# Patient Record
Sex: Male | Born: 1975
Health system: Southern US, Community
[De-identification: ages and names within clinical notes are randomized; demographics above are authoritative.]

## PROBLEM LIST (undated history)

## (undated) DIAGNOSIS — K5790 Diverticulosis of intestine, part unspecified, without perforation or abscess without bleeding: Secondary | ICD-10-CM

## (undated) DIAGNOSIS — K839 Disease of biliary tract, unspecified: Secondary | ICD-10-CM

## (undated) DIAGNOSIS — K8689 Other specified diseases of pancreas: Secondary | ICD-10-CM

## (undated) DIAGNOSIS — K863 Pseudocyst of pancreas: Secondary | ICD-10-CM

## (undated) DIAGNOSIS — I10 Essential (primary) hypertension: Secondary | ICD-10-CM

## (undated) DIAGNOSIS — R112 Nausea with vomiting, unspecified: Secondary | ICD-10-CM

## (undated) DIAGNOSIS — K219 Gastro-esophageal reflux disease without esophagitis: Secondary | ICD-10-CM

## (undated) DIAGNOSIS — E7800 Pure hypercholesterolemia, unspecified: Secondary | ICD-10-CM

## (undated) DIAGNOSIS — Z889 Allergy status to unspecified drugs, medicaments and biological substances status: Secondary | ICD-10-CM

## (undated) DIAGNOSIS — K859 Acute pancreatitis without necrosis or infection, unspecified: Secondary | ICD-10-CM

## (undated) DIAGNOSIS — F32A Depression, unspecified: Secondary | ICD-10-CM

## (undated) DIAGNOSIS — K760 Fatty (change of) liver, not elsewhere classified: Secondary | ICD-10-CM

## (undated) DIAGNOSIS — F329 Major depressive disorder, single episode, unspecified: Secondary | ICD-10-CM

## (undated) DIAGNOSIS — Z9889 Other specified postprocedural states: Secondary | ICD-10-CM

## (undated) HISTORY — DX: Pseudocyst of pancreas: K86.3

## (undated) HISTORY — DX: Disease of biliary tract, unspecified: K83.9

## (undated) HISTORY — DX: Diverticulosis of intestine, part unspecified, without perforation or abscess without bleeding: K57.90

## (undated) HISTORY — DX: Other specified diseases of pancreas: K86.89

## (undated) HISTORY — PX: WISDOM TOOTH EXTRACTION: SHX21

## (undated) HISTORY — DX: Fatty (change of) liver, not elsewhere classified: K76.0

---

## 2016-01-05 ENCOUNTER — Emergency Department (HOSPITAL_COMMUNITY): Payer: BLUE CROSS/BLUE SHIELD

## 2016-01-05 ENCOUNTER — Encounter (HOSPITAL_COMMUNITY): Payer: Self-pay | Admitting: Emergency Medicine

## 2016-01-05 ENCOUNTER — Inpatient Hospital Stay (HOSPITAL_COMMUNITY)
Admission: EM | Admit: 2016-01-05 | Discharge: 2016-02-03 | DRG: 439 | Disposition: A | Discharge status: Home / Self Care | Payer: BLUE CROSS/BLUE SHIELD | Attending: Internal Medicine | Admitting: Internal Medicine

## 2016-01-05 DIAGNOSIS — R740 Nonspecific elevation of levels of transaminase and lactic acid dehydrogenase [LDH]: Secondary | ICD-10-CM | POA: Diagnosis present

## 2016-01-05 DIAGNOSIS — K851 Biliary acute pancreatitis without necrosis or infection: Secondary | ICD-10-CM | POA: Diagnosis not present

## 2016-01-05 DIAGNOSIS — R1110 Vomiting, unspecified: Secondary | ICD-10-CM | POA: Insufficient documentation

## 2016-01-05 DIAGNOSIS — F329 Major depressive disorder, single episode, unspecified: Secondary | ICD-10-CM | POA: Diagnosis not present

## 2016-01-05 DIAGNOSIS — Z79899 Other long term (current) drug therapy: Secondary | ICD-10-CM | POA: Diagnosis not present

## 2016-01-05 DIAGNOSIS — K8581 Other acute pancreatitis with uninfected necrosis: Secondary | ICD-10-CM | POA: Diagnosis not present

## 2016-01-05 DIAGNOSIS — E7800 Pure hypercholesterolemia, unspecified: Secondary | ICD-10-CM | POA: Diagnosis present

## 2016-01-05 DIAGNOSIS — K567 Ileus, unspecified: Secondary | ICD-10-CM | POA: Diagnosis not present

## 2016-01-05 DIAGNOSIS — B358 Other dermatophytoses: Secondary | ICD-10-CM | POA: Diagnosis present

## 2016-01-05 DIAGNOSIS — K8592 Acute pancreatitis with infected necrosis, unspecified: Secondary | ICD-10-CM | POA: Diagnosis not present

## 2016-01-05 DIAGNOSIS — K858 Other acute pancreatitis without necrosis or infection: Secondary | ICD-10-CM | POA: Diagnosis present

## 2016-01-05 DIAGNOSIS — K863 Pseudocyst of pancreas: Secondary | ICD-10-CM | POA: Diagnosis present

## 2016-01-05 DIAGNOSIS — R509 Fever, unspecified: Secondary | ICD-10-CM | POA: Diagnosis not present

## 2016-01-05 DIAGNOSIS — R1310 Dysphagia, unspecified: Secondary | ICD-10-CM | POA: Diagnosis not present

## 2016-01-05 DIAGNOSIS — K8020 Calculus of gallbladder without cholecystitis without obstruction: Secondary | ICD-10-CM | POA: Diagnosis present

## 2016-01-05 DIAGNOSIS — Z205 Contact with and (suspected) exposure to viral hepatitis: Secondary | ICD-10-CM | POA: Diagnosis not present

## 2016-01-05 DIAGNOSIS — K8501 Idiopathic acute pancreatitis with uninfected necrosis: Secondary | ICD-10-CM | POA: Diagnosis not present

## 2016-01-05 DIAGNOSIS — K859 Acute pancreatitis without necrosis or infection, unspecified: Secondary | ICD-10-CM | POA: Diagnosis not present

## 2016-01-05 DIAGNOSIS — K8689 Other specified diseases of pancreas: Secondary | ICD-10-CM | POA: Insufficient documentation

## 2016-01-05 DIAGNOSIS — Z4659 Encounter for fitting and adjustment of other gastrointestinal appliance and device: Secondary | ICD-10-CM

## 2016-01-05 DIAGNOSIS — F1123 Opioid dependence with withdrawal: Secondary | ICD-10-CM | POA: Diagnosis not present

## 2016-01-05 DIAGNOSIS — K8591 Acute pancreatitis with uninfected necrosis, unspecified: Secondary | ICD-10-CM | POA: Diagnosis not present

## 2016-01-05 DIAGNOSIS — I10 Essential (primary) hypertension: Secondary | ICD-10-CM | POA: Diagnosis present

## 2016-01-05 DIAGNOSIS — K8080 Other cholelithiasis without obstruction: Secondary | ICD-10-CM | POA: Diagnosis not present

## 2016-01-05 DIAGNOSIS — Z880 Allergy status to penicillin: Secondary | ICD-10-CM

## 2016-01-05 DIAGNOSIS — D649 Anemia, unspecified: Secondary | ICD-10-CM | POA: Diagnosis present

## 2016-01-05 DIAGNOSIS — R112 Nausea with vomiting, unspecified: Secondary | ICD-10-CM | POA: Diagnosis not present

## 2016-01-05 DIAGNOSIS — R188 Other ascites: Secondary | ICD-10-CM | POA: Diagnosis present

## 2016-01-05 DIAGNOSIS — E785 Hyperlipidemia, unspecified: Secondary | ICD-10-CM

## 2016-01-05 DIAGNOSIS — D72829 Elevated white blood cell count, unspecified: Secondary | ICD-10-CM | POA: Diagnosis not present

## 2016-01-05 DIAGNOSIS — K219 Gastro-esophageal reflux disease without esophagitis: Secondary | ICD-10-CM | POA: Diagnosis present

## 2016-01-05 HISTORY — DX: Pure hypercholesterolemia, unspecified: E78.00

## 2016-01-05 HISTORY — DX: Essential (primary) hypertension: I10

## 2016-01-05 LAB — URINALYSIS, ROUTINE W REFLEX MICROSCOPIC
Bilirubin Urine: NEGATIVE
GLUCOSE, UA: NEGATIVE mg/dL
HGB URINE DIPSTICK: NEGATIVE
KETONES UR: NEGATIVE mg/dL
Leukocytes, UA: NEGATIVE
Nitrite: NEGATIVE
PROTEIN: NEGATIVE mg/dL
Specific Gravity, Urine: 1.011 (ref 1.005–1.030)
pH: 6 (ref 5.0–8.0)

## 2016-01-05 LAB — CBC WITH DIFFERENTIAL/PLATELET
BASOS ABS: 0 10*3/uL (ref 0.0–0.1)
Basophils Relative: 0 %
EOS ABS: 0 10*3/uL (ref 0.0–0.7)
EOS PCT: 0 %
HCT: 50.1 % (ref 39.0–52.0)
Hemoglobin: 16.7 g/dL (ref 13.0–17.0)
LYMPHS PCT: 8 %
Lymphs Abs: 1.3 10*3/uL (ref 0.7–4.0)
MCH: 27.2 pg (ref 26.0–34.0)
MCHC: 33.3 g/dL (ref 30.0–36.0)
MCV: 81.5 fL (ref 78.0–100.0)
MONO ABS: 1.2 10*3/uL — AB (ref 0.1–1.0)
Monocytes Relative: 7 %
Neutro Abs: 14.3 10*3/uL — ABNORMAL HIGH (ref 1.7–7.7)
Neutrophils Relative %: 85 %
PLATELETS: 347 10*3/uL (ref 150–400)
RBC: 6.15 MIL/uL — AB (ref 4.22–5.81)
RDW: 13.5 % (ref 11.5–15.5)
WBC: 16.8 10*3/uL — AB (ref 4.0–10.5)

## 2016-01-05 LAB — COMPREHENSIVE METABOLIC PANEL
ALT: 383 U/L — ABNORMAL HIGH (ref 17–63)
AST: 581 U/L — AB (ref 15–41)
Albumin: 4.7 g/dL (ref 3.5–5.0)
Alkaline Phosphatase: 56 U/L (ref 38–126)
Anion gap: 13 (ref 5–15)
BUN: 12 mg/dL (ref 6–20)
CO2: 26 mmol/L (ref 22–32)
CREATININE: 1.2 mg/dL (ref 0.61–1.24)
Calcium: 9.6 mg/dL (ref 8.9–10.3)
Chloride: 102 mmol/L (ref 101–111)
GFR calc Af Amer: >60 mL/min (ref >60)
Glucose, Bld: 136 mg/dL — ABNORMAL HIGH (ref 65–99)
Potassium: 3.8 mmol/L (ref 3.5–5.1)
SODIUM: 141 mmol/L (ref 135–145)
TOTAL PROTEIN: 7.4 g/dL (ref 6.5–8.1)
Total Bilirubin: 1.2 mg/dL (ref 0.3–1.2)

## 2016-01-05 LAB — LIPASE, BLOOD

## 2016-01-05 LAB — ETHANOL: Alcohol, Ethyl (B): <5 mg/dL (ref <5)

## 2016-01-05 LAB — TRIGLYCERIDES: Triglycerides: 42 mg/dL (ref <150)

## 2016-01-05 MED ORDER — ONDANSETRON HCL 4 MG/2ML IJ SOLN
4.0000 mg | Freq: Once | INTRAMUSCULAR | Status: AC
  Administered 2016-01-05: 4 mg via INTRAVENOUS
  Filled 2016-01-05: qty 2

## 2016-01-05 MED ORDER — ONDANSETRON HCL 4 MG PO TABS: 4.0000 mg | ORAL_TABLET | Freq: Four times a day (QID) | ORAL | Status: DC | PRN

## 2016-01-05 MED ORDER — FENTANYL CITRATE (PF) 100 MCG/2ML IJ SOLN
50.0000 ug | Freq: Once | INTRAMUSCULAR | Status: AC
  Administered 2016-01-05: 50 ug via INTRAVENOUS
  Filled 2016-01-05: qty 2

## 2016-01-05 MED ORDER — SODIUM CHLORIDE 0.9 % IV BOLUS (SEPSIS)
1000.0000 mL | Freq: Once | INTRAVENOUS | Status: AC
  Administered 2016-01-05: 1000 mL via INTRAVENOUS

## 2016-01-05 MED ORDER — HYDROMORPHONE HCL 1 MG/ML IJ SOLN
1.0000 mg | INTRAMUSCULAR | Status: DC | PRN
  Administered 2016-01-05: 1 mg via INTRAVENOUS
  Filled 2016-01-05: qty 1

## 2016-01-05 MED ORDER — ONDANSETRON HCL 4 MG/2ML IJ SOLN
4.0000 mg | Freq: Four times a day (QID) | INTRAMUSCULAR | Status: DC | PRN
  Administered 2016-01-05 (×2): 4 mg via INTRAVENOUS
  Filled 2016-01-05 (×2): qty 2

## 2016-01-05 MED ORDER — PROMETHAZINE HCL 25 MG/ML IJ SOLN
25.0000 mg | Freq: Once | INTRAMUSCULAR | Status: AC
  Administered 2016-01-05: 25 mg via INTRAVENOUS
  Filled 2016-01-05: qty 1

## 2016-01-05 MED ORDER — HYDROMORPHONE HCL 1 MG/ML IJ SOLN
2.0000 mg | Freq: Once | INTRAMUSCULAR | Status: AC
  Administered 2016-01-05: 2 mg via INTRAVENOUS
  Filled 2016-01-05: qty 2

## 2016-01-05 MED ORDER — ENOXAPARIN SODIUM 40 MG/0.4ML ~~LOC~~ SOLN
40.0000 mg | SUBCUTANEOUS | Status: DC
  Administered 2016-01-05 – 2016-02-02 (×29): 40 mg via SUBCUTANEOUS
  Filled 2016-01-05 (×28): qty 0.4

## 2016-01-05 MED ORDER — HYDROMORPHONE HCL 1 MG/ML IJ SOLN
1.0000 mg | Freq: Once | INTRAMUSCULAR | Status: AC
  Administered 2016-01-05: 1 mg via INTRAVENOUS
  Filled 2016-01-05: qty 1

## 2016-01-05 MED ORDER — SODIUM CHLORIDE 0.9 % IV SOLN
8.0000 mg | Freq: Four times a day (QID) | INTRAVENOUS | Status: DC | PRN
  Administered 2016-01-06: 8 mg via INTRAVENOUS
  Filled 2016-01-05 (×2): qty 4

## 2016-01-05 MED ORDER — IOHEXOL 300 MG/ML  SOLN
100.0000 mL | Freq: Once | INTRAMUSCULAR | Status: AC | PRN
  Administered 2016-01-05: 100 mL via INTRAVENOUS

## 2016-01-05 MED ORDER — FAMOTIDINE IN NACL 20-0.9 MG/50ML-% IV SOLN
20.0000 mg | Freq: Once | INTRAVENOUS | Status: AC
  Administered 2016-01-05: 20 mg via INTRAVENOUS
  Filled 2016-01-05: qty 50

## 2016-01-05 MED ORDER — HYDROMORPHONE HCL 1 MG/ML IJ SOLN
1.0000 mg | INTRAMUSCULAR | Status: DC | PRN
  Administered 2016-01-05 – 2016-01-06 (×5): 1 mg via INTRAVENOUS
  Filled 2016-01-05 (×5): qty 1

## 2016-01-05 MED ORDER — SODIUM CHLORIDE 0.9 % IV SOLN
INTRAVENOUS | Status: DC
  Administered 2016-01-05 – 2016-01-06 (×4): via INTRAVENOUS

## 2016-01-05 NOTE — ED Notes (Signed)
Patient transported to CT 

## 2016-01-05 NOTE — ED Notes (Signed)
Patient reports having lower left abdominal pain that occasionally goes to his umbilicus. Denies abdominal surgeries, reports taking an antacid without relief. Reports 7 episodes of nausea today.

## 2016-01-05 NOTE — ED Notes (Signed)
Reported Drew Prince count of 16.8 to [place] PA-C, no fever. He acknowledges, no lactic needed at this time.

## 2016-01-05 NOTE — ED Notes (Signed)
PA at bedside.

## 2016-01-05 NOTE — H&P (Signed)
Date: 01/05/2016               Patient Name:  Drew Prince MRN: [id number]  DOB: 19-Feb-1976 Age / Sex: 40 y.o., male   PCP: [facility] [place]         Medical Service: Internal Medicine Teaching Service         Attending Physician: Dr. Tomasita Crumble, MD    First Contact: Dr. Selina Cooley Pager: [phone]  Second Contact: Dr. Rich Number Pager: [phone]       After Hours (After 5p/  First Contact Pager: [phone]  weekends / holidays): Second Contact Pager: [phone]   Chief Complaint: "My stomach is killing me."  History of Present Illness:  Drew Prince is a 40 year old man with history of hypertension and hyperlipidemia presenting with acute onset abdominal pain.  Last night around 1:30 AM, he started feeling a sharp periumbilical pain, nausea, and vomiting. The pain spread across his lower abdomen and he continued to feel nauseous, so he came into the emergency department. He denies any fevers, he rarely drinks alcohol (his last drink was a shot on New Year's Eve), he has never had gallstones, and he has been all for his medications for the last 2-3 years, without any new changes. He hasn't started taking any new NSAIDs, steroids, or herbal supplements. Prior to his acute onset abdominal pain, he was feeling very well, denies any rashes, weight loss, joint pains, and there are no autoimmune diseases in his family that he is aware of.  Meds: No current facility-administered medications for this encounter.   Current Outpatient Prescriptions  Medication Sig Dispense Refill  . atorvastatin (LIPITOR) 10 MG tablet Take 10 mg by mouth daily.    Marland Kitchen buPROPion (WELLBUTRIN XL) 150 MG 24 hr tablet Take 150 mg by mouth daily.    . fenofibrate (TRICOR) 145 MG tablet Take 145 mg by mouth daily.    Marland Kitchen lisinopril (PRINIVIL,ZESTRIL) 5 MG tablet Take 5 mg by mouth daily.     Allergies: Allergies as of 01/05/2016 - Review Complete 01/05/2016  Allergen Reaction Noted  .  Penicillins Rash 01/05/2016   Past Medical History  Diagnosis Date  . Hypertension    History reviewed. No pertinent past surgical history. History reviewed. No pertinent family history. Social History   Social History  . Marital Status: Married    Spouse Name: N/A  . Number of Children: N/A  . Years of Education: N/A   Occupational History  . Not on file.   Social History Main Topics  . Smoking status: Never Smoker   . Smokeless tobacco: Not on file  . Alcohol Use: Yes     Comment: rarely  . Drug Use: Not on file  . Sexual Activity: Not on file   Other Topics Concern  . Not on file   Social History Narrative  . No narrative on file   Review of Systems  Constitutional: Negative for fever, chills, weight loss and malaise/fatigue.  HENT: Negative for congestion.   Eyes: Negative for blurred vision and double vision.  Respiratory: Negative for cough and shortness of breath.   Cardiovascular: Negative for chest pain and palpitations.  Gastrointestinal: Positive for nausea, vomiting and abdominal pain. Negative for diarrhea, constipation, blood in stool and melena.  Genitourinary: Negative for dysuria and urgency.  Musculoskeletal: Negative for myalgias and back pain.  Skin: Negative for itching and rash.  Neurological: Negative for dizziness, loss of consciousness and headaches.  Psychiatric/Behavioral: Negative for  depression and substance abuse.   Physical Exam: Blood pressure 161/113, pulse 100, temperature 98.2 F (36.8 C), temperature source Oral, resp. rate 20, SpO2 100 %. General: resting in bed, writhing around in pain, with a green vomit bag in his hand HEENT: no scleral icterus, extra-ocular muscles intact, oropharynx without lesions Cardiac: regular rate and rhythm, no rubs, murmurs or gallops Pulm: breathing well, clear to auscultation bilaterally Abd: bowel sounds hypoactive, tender throughout, but mostly in the epigastrium, no peritoneal signs, no Cullen  or Turner sign, and soft, nondistended Ext: warm and well perfused, without pedal edema Lymph: no cervical or supraclavicular lymphadenopathy Skin: no rash, hair, or nail changes Neuro: alert and oriented X3, cranial nerves II-XII grossly intact, moving all extremities well  Lab results: Basic Metabolic Panel:  Recent Labs  16/10/96 0615  NA 141  K 3.8  CL 102  CO2 26  GLUCOSE 136*  BUN 12  CREATININE 1.20  CALCIUM 9.6   Liver Function Tests:  Recent Labs  01/05/16 0615  AST 581*  ALT 383*  ALKPHOS 56  BILITOT 1.2  PROT 7.4  ALBUMIN 4.7    Recent Labs  01/05/16 0615  LIPASE >3000*   CBC:  Recent Labs  01/05/16 0615  WBC 16.8*  NEUTROABS 14.3*  HGB 16.7  HCT 50.1  MCV 81.5  PLT 347   Fasting Lipid Panel:  Recent Labs  01/05/16 0920  TRIG 42   Urinalysis:  Recent Labs  01/05/16 0757  COLORURINE YELLOW  LABSPEC 1.011  PHURINE 6.0  GLUCOSEU NEGATIVE  HGBUR NEGATIVE  BILIRUBINUR NEGATIVE  KETONESUR NEGATIVE  PROTEINUR NEGATIVE  NITRITE NEGATIVE  LEUKOCYTESUR NEGATIVE   Imaging results:  Ct Abdomen Pelvis W Contrast  01/05/2016  CLINICAL DATA:  Left lower quadrant pain radiating to right side, with nausea and vomiting. EXAM: CT ABDOMEN AND PELVIS WITH CONTRAST TECHNIQUE: Multidetector CT imaging of the abdomen and pelvis was performed using the standard protocol following bolus administration of intravenous contrast. CONTRAST:  OMNIPAQUE IOHEXOL 300 MG/ML  SOLN COMPARISON:  None. FINDINGS: Lower chest:  No acute findings. Hepatobiliary: Liver is low in density suggesting fatty infiltration. Small layering gallstones appreciated within the nondistended gallbladder. Gallbladder walls appear thickened, likely reactive in nature. Pancreas: Ill-defined fluid surrounding the pancreas, with extension into the right upper quadrant and left upper quadrant, consistent with acute pancreatitis. At least mild edema within the distal pancreatic body. No  evidence of parenchymal necrosis or peripancreatic hemorrhage. No pancreatic mass or pancreatic duct dilatation appreciated. No evidence of pseudocyst formation. Spleen: Within normal limits in size and appearance. Adrenals/Urinary Tract: Adrenal glands appear normal. Kidneys are unremarkable without stone or hydronephrosis. No ureteral or bladder calculi identified. Stomach/Bowel: Bowel is normal in caliber. No bowel wall thickening or evidence of bowel wall inflammation seen. Scattered mild diverticulosis noted without evidence of acute diverticulitis. Appendix is unremarkable. Vascular/Lymphatic: No pathologically enlarged lymph nodes. No evidence of abdominal aortic aneurysm. Reproductive: No mass or other significant abnormality. Other: None. Musculoskeletal: Mild degenerative change within the lumbar spine. No acute osseous abnormality. Superficial soft tissues are unremarkable. IMPRESSION: 1. Acute pancreatitis, with moderate amount of ill-defined fluid surrounding the pancreas and extending into the adjacent left upper quadrant and right upper quadrant. Associated edema within the distal pancreatic body. No complicating features. 2. Fatty infiltration of the liver. 3. Cholelithiasis. Gallbladder wall thickening is likely reactive to the adjacent pancreatitis and/or liver disease. 4. Mild colonic diverticulosis without evidence of acute diverticulitis. Electronically Signed   By:  Bary RichardStan  Maynard M.D.   On: 01/05/2016 08:38    Assessment & Plan by Problem:  [name] is a 40 year old Asian man with hypertension and hyperlipidemia presenting with acute pancreatitis as evidenced by a lipase greater than 3000 and acute pancreatitis seen on abdominal CT. He rarely drinks alcohol, there is no evidence of choledocholithiasis nor an obstructive pattern on his liver function tests, his triglyceride level was normal, and calcium level was normal, so I don't think this is alcohol, gallstone, hypertriglyceridemia,  or hypercalcemia induced. He has been on all of his medications since the last 2-3 years; however, lisinopril, atorvastatin, and fenofibrate have all been reported to cause medication-induced pancreatitis so this is my leading theory. Autoimmune pancreatitis is another consideration as he is an [place] man, but he doesn't have any history of autoimmune conditions nor any suggestive family history. We will make nothing by mouth but advance his diet as he tolerates, give IV fluids, control his pain and nausea, and hold all of his medications. There was no evidence of pancreatic necrosis to warrant antibiotics. His [place] 2 score is low at 7, with a 7% mortality rate. I am hopeful he will improve in the next 3 days. If he fails to improve, we will of course re-image him and can also consider getting an IgG4 level to look for autoimmune pancreatitis and start steroids if this is positive.  Acute pancreatitis: Per above, I think this is most likely drug-induced or idiopathic. -Holding home medications -Nothing by mouth, advancing diet as tolerated to promote early enteral feeding -Dilaudid 1 mg every 3 hours as needed for pain -Ondansetron 4 mg every 6 hours as needed for nausea   Transaminitis: AST 580, ALT 380, consistent with hepatocellular transaminitis. I think this is most likely from fatty liver as evidenced on his abdominal CT for perhaps from adjacent inflammation from the pancreas. As mentioned above, he hardly drinks alcohol. -CMP tomorrow  Hypertension: Per above, we are holding his lisinopril. -Holding lisinopril  Hyperlipidemia: Per above, his triglyceride level is normal, and we are holding his fenofibrate and atorvastatin. -Holding fenofibrate and atorvastatin  Dispo: Disposition is deferred at this time, awaiting improvement of current medical problems. Anticipated discharge in approximately 2-3 day(s).   The patient does have [facility]  and [facility].  The patient does not have transportation limitations that hinder transportation to clinic appointments.  Signed: Selina CooleyKyle Indiyah Paone, MD 01/05/2016, 10:23 AM

## 2016-01-05 NOTE — ED Notes (Signed)
Admitting MDs at bedside.

## 2016-01-05 NOTE — ED Notes (Signed)
Tinnie GensJeffrey, PA-C, at the bedside.

## 2016-01-05 NOTE — Discharge Summary (Signed)
Name: Drew Prince MRN: [id number] DOB: 25-Feb-1976 40 y.o. [facility] [place]  Date of Admission: 01/05/2016  5:59 AM Date of Discharge: 02/03/2016 Attending Physician: Gardiner Barefootobert W Comer, MD  Discharge Diagnosis: 1. Acute pancreatitis thought to be medication-induced versus idiopathic 2. Hypertension 3. Hyperlipidemia   Discharge Medications:   Medication List    STOP taking these medications        atorvastatin 10 MG tablet  Commonly known as:  LIPITOR     fenofibrate 145 MG tablet  Commonly known as:  TRICOR     lisinopril 5 MG tablet  Commonly known as:  PRINIVIL,ZESTRIL      TAKE these medications        buPROPion 150 MG 24 hr tablet  Commonly known as:  WELLBUTRIN XL  Take 150 mg by mouth daily.     ondansetron 4 MG disintegrating tablet  Commonly known as:  ZOFRAN-ODT  Take 1-2 tablets (4-8 mg total) by mouth every 8 (eight) hours as needed for nausea or vomiting.     oxyCODONE 5 MG immediate release tablet  Commonly known as:  Oxy IR/ROXICODONE  Take 1-2 tablets (5-10 mg total) by mouth every 4 (four) hours as needed for severe pain.     pantoprazole 40 MG tablet  Commonly known as:  PROTONIX  Take 1 tablet (40 mg total) by mouth daily.        Disposition and follow-up:   [name] [facility]  At the hospital follow up visit please address:  1. Resolution of his abdominal pain  2. Evaluate for subsequent chronic pancreatitis and consider lipase enzymes  Follow-up Appointments:  Our secretaries will schedule an appointment some time between 2/13-2/17  Consultations:  General surgery Gastroenterology  Procedures Performed:  [place] Abdomen Complete  01/06/2016  CLINICAL DATA:  Acute pancreatitis, fatty infiltrative change of the liver, gallstones, ascites. EXAM: ABDOMEN ULTRASOUND COMPLETE COMPARISON:  Abdominal and pelvic CT scan of January 05, 2016 FINDINGS:  Gallbladder: The gallbladder is adequately distended. There are multiple echogenic shadowing stones measuring up to 1.2 cm. There is no gallbladder wall thickening, pericholecystic fluid, or positive sonographic Murphy's sign. Common bile duct: Diameter: 5.3 mm Liver: The liver exhibits increased echotexture diffusely compatible with fatty infiltrative change. There is no focal mass or ductal dilation. IVC: No abnormality visualized. Pancreas: The pancreatic bed is partially obscured by bowel gas. Only a small portion of the pancreatic body is demonstrated. Spleen: Size and appearance within normal limits. Right Kidney: Length: 12 cm. Echogenicity within normal limits. No mass or hydronephrosis visualized. Left Kidney: Length: 12.4 cm. Echogenicity within normal limits. No mass or hydronephrosis visualized. Abdominal aorta: Visualization of the mid aorta is limited by bowel gas. The proximal and distal aorta are normal in caliber. Other findings: None. IMPRESSION: 1. Limited visualization of the pancreas. 2. Gallstones without sonographic and evidence of acute cholecystitis. Fatty infiltrative change of the liver. 3. No acute abnormality is observed elsewhere within the abdomen. Electronically Signed   By: David  SwazilandJordan M.D.   On: 01/06/2016 12:22   Ct Abdomen Pelvis W Contrast  01/10/2016  CLINICAL DATA:  Pancreatitis follow-up. History of hypertension and hypercholesterolemia. EXAM: CT ABDOMEN AND PELVIS WITH CONTRAST TECHNIQUE: Multidetector CT imaging of the abdomen and pelvis was performed using the standard protocol following bolus administration of intravenous contrast. CONTRAST:  100mL OMNIPAQUE IOHEXOL 300 MG/ML  SOLN COMPARISON:  01/05/2016 FINDINGS: Lower chest: Small new bilateral  pleural effusions are present. There is new left basilar atelectasis or early consolidation. Upper abdomen: The liver is diffusely low in attenuation and consistent with hepatic steatosis. No focal liver lesions are  identified. No focal abnormality identified within the spleen or left kidney. Small right renal cyst is identified. There is significant peripancreatic and retroperitoneal fluid, significantly increased since the prior study. There is a 2.0 x 2.9 cm area of the distal pancreatic body showing poor enhancement on today's exam, consistent with infarction or early necrosis. A similar area of poor enhancement is identified along the ventral aspect of the mid pancreas measuring 1.3 x 3.6 cm. There is significant fluid within the lesser sac. However no discrete fluid collections are identified to indicate pseudocyst or abscess. Gallstones are better seen on the prior study. Gastrointestinal tract: Partial malrotation of the small bowel. Jejunal loops distended in the right abdomen. Appendix is normal in appearance. Colonic loops contain residual contrast following recent CT exam and are otherwise unremarkable. Pelvis: Urinary bladder is distended. The prostate gland and seminal vesicles have a normal appearance. Small amount of free pelvic fluid noted. Retroperitoneum: No evidence for aortic aneurysm. Small retroperitoneal lymph nodes are nonspecific and likely reactive. Abdominal wall: Mild body wall edema. Osseous structures: Unremarkable. IMPRESSION: 1. Interval progression of peripancreatic and retroperitoneal exam dates. 2. New areas of pancreatic ischemia or necrosis. 3. No pseudocyst or abscess identified. 4. New small bilateral pleural effusions and bibasilar atelectasis or early consolidation. 5. Hepatic steatosis. 6. Partial malrotation of the small bowel incidentally noted. No bowel obstruction. 7. Mild body wall edema. The salient findings were discussed with Dr. Derrell Lolling on 01/10/2016 at 1:32 pm. Electronically Signed   By: Norva Pavlov M.D.   On: 01/10/2016 13:32   Ct Abdomen Pelvis W Contrast  01/05/2016  CLINICAL DATA:  Left lower quadrant pain radiating to right side, with nausea and vomiting. EXAM: CT  ABDOMEN AND PELVIS WITH CONTRAST TECHNIQUE: Multidetector CT imaging of the abdomen and pelvis was performed using the standard protocol following bolus administration of intravenous contrast. CONTRAST:  OMNIPAQUE IOHEXOL 300 MG/ML  SOLN COMPARISON:  None. FINDINGS: Lower chest:  No acute findings. Hepatobiliary: Liver is low in density suggesting fatty infiltration. Small layering gallstones appreciated within the nondistended gallbladder. Gallbladder walls appear thickened, likely reactive in nature. Pancreas: Ill-defined fluid surrounding the pancreas, with extension into the right upper quadrant and left upper quadrant, consistent with acute pancreatitis. At least mild edema within the distal pancreatic body. No evidence of parenchymal necrosis or peripancreatic hemorrhage. No pancreatic mass or pancreatic duct dilatation appreciated. No evidence of pseudocyst formation. Spleen: Within normal limits in size and appearance. Adrenals/Urinary Tract: Adrenal glands appear normal. Kidneys are unremarkable without stone or hydronephrosis. No ureteral or bladder calculi identified. Stomach/Bowel: Bowel is normal in caliber. No bowel wall thickening or evidence of bowel wall inflammation seen. Scattered mild diverticulosis noted without evidence of acute diverticulitis. Appendix is unremarkable. Vascular/Lymphatic: No pathologically enlarged lymph nodes. No evidence of abdominal aortic aneurysm. Reproductive: No mass or other significant abnormality. Other: None. Musculoskeletal: Mild degenerative change within the lumbar spine. No acute osseous abnormality. Superficial soft tissues are unremarkable. IMPRESSION: 1. Acute pancreatitis, with moderate amount of ill-defined fluid surrounding the pancreas and extending into the adjacent left upper quadrant and right upper quadrant. Associated edema within the distal pancreatic body. No complicating features. 2. Fatty infiltration of the liver. 3. Cholelithiasis.  Gallbladder wall thickening is likely reactive to the adjacent pancreatitis and/or liver disease. 4.  Mild colonic diverticulosis without evidence of acute diverticulitis. Electronically Signed   By: Bary Richard M.D.   On: 01/05/2016 08:38   Mr 3d Recon At Scanner  01/17/2016  CLINICAL DATA:  Pancreatitis presumed to be from gallstones not resolving after 10 days, concern for a biliary anatomical defect. Pt has abdominal pain. mostly tender in the left lower quadrant EXAM: MRI ABDOMEN WITHOUT AND WITH CONTRAST (INCLUDING MRCP) TECHNIQUE: Multiplanar multisequence MR imaging of the abdomen was performed both before and after the administration of intravenous contrast. Heavily T2-weighted images of the biliary and pancreatic ducts were obtained, and three-dimensional MRCP images were rendered by post processing. CONTRAST:  16mL MULTIHANCE GADOBENATE DIMEGLUMINE 529 MG/ML IV SOLN COMPARISON:  CT 01/10/2016 FINDINGS: Lower chest:  Lung bases are clear. Hepatobiliary: No intrahepatic biliary duct dilatation. The common bile duct is normal caliber without obstruction or choledocholithiasis. There is a gallstone within the gallbladder measuring 10 millimeters. No gallbladder inflammation identified. Pancreas: The pancreatic duct is thin and difficult to follow. No variant pancreatic ductal anatomy. The main pancreatic duct joins the common bile duct at the ampulla. The duct is followed through the head and body (image 27, series 3). There is a short segment of pancreas were the duct is difficult to follow. This is the same segment where there is a loss of enhancement on the post-contrast imaging (image 27, series 3 ; image 47, series 1302). Concern for pancreatic ductal interruption at this level. This region is the same region which was de vascularized on comparison and continues to demonstrate absent parenchymal enhancement. This region measures 2 centimeters in length (image 47, series 1301). The tail the pancreas  has normal enhancement. The fluid collections along the body and tail the pancreas are now organized with enhancing thin rim. Fluid collections appear to communicate with the epicenter surrounding the region of pancreatic devascularization. The collection positioned between the stomach, pancreatic tail and spleen measures 9.7 x 4.7 centimeters (image 36, series 1302). Collection along the body of the pancreas measures 3.4 x 7.2 centimeters. The enhancing collections extend inferiorly along the anterior para renal fascia on the LEFT (image 60, series 1302). Spleen: Normal spleen. Adrenals/urinary tract: Adrenal glands and kidneys are normal. Stomach/Bowel: Stomach and limited of the small bowel is unremarkable Vascular/Lymphatic: Abdominal aortic normal caliber. No retroperitoneal periportal lymphadenopathy. Musculoskeletal: No aggressive osseous lesion IMPRESSION: 1. Fluid collections surrounding the body and tail the pancreas are more organized with thin enhancing rim consistent with pseudocyst formation. 2. Peripancreatic enhancing fluid collections have epicenter at a region devascularization of the mid pancreas. This pancreatic duct is difficult to follow through this region of devascularization with concern for ductal interruption at this level. The tail the pancreas is normal with normal enhanced. The distal body and head of the pancreas are normal. 3. No variant pancreatic ductal anatomy. 4. Common bile duct is normal caliber without obstruction. 5. Gallstones within the gallbladder. 6. No intrahepatic biliary duct dilatation. Electronically Signed   By: Genevive Bi M.D.   On: 01/17/2016 08:30   Dg Chest Port 1 View  01/10/2016  CLINICAL DATA:  Line placement. EXAM: PORTABLE CHEST 1 VIEW COMPARISON:  None. FINDINGS: Right PICC line noted with tip at cavoatrial junction. Low lung volumes with mild bibasilar atelectasis. Small left pleural effusion. No pneumothorax. IMPRESSION: 1. Right PICC line noted  with tip in cavoatrial junction. 2. Low lung volumes with mild bibasilar atelectasis and left pleural effusion. Electronically Signed   By: Maisie Fus  Register  On: 01/10/2016 14:42   Dg Abd Portable 1v  01/13/2016  CLINICAL DATA:  40 year old male status post feeding tube placement. EXAM: PORTABLE ABDOMEN - 1 VIEW COMPARISON:  No priors. FINDINGS: Single supine view of the abdomen demonstrates the tip of the feeding tube in the antral pre-pyloric region of the stomach. Some gas is noted throughout visualized small bowel and colon. Several nondilated gas-filled loops of small bowel are noted in the lower abdomen. No definite pathologic dilatation of small bowel loops. No pneumoperitoneum. IMPRESSION: 1. Tip of feeding tube is in the antral pre-pyloric region of the stomach. Electronically Signed   By: Trudie Reed M.D.   On: 01/13/2016 17:12   Mr Abd W/wo Cm/mrcp  01/17/2016  CLINICAL DATA:  Pancreatitis presumed to be from gallstones not resolving after 10 days, concern for a biliary anatomical defect. Pt has abdominal pain. mostly tender in the left lower quadrant EXAM: MRI ABDOMEN WITHOUT AND WITH CONTRAST (INCLUDING MRCP) TECHNIQUE: Multiplanar multisequence MR imaging of the abdomen was performed both before and after the administration of intravenous contrast. Heavily T2-weighted images of the biliary and pancreatic ducts were obtained, and three-dimensional MRCP images were rendered by post processing. CONTRAST:  16mL MULTIHANCE GADOBENATE DIMEGLUMINE 529 MG/ML IV SOLN COMPARISON:  CT 01/10/2016 FINDINGS: Lower chest:  Lung bases are clear. Hepatobiliary: No intrahepatic biliary duct dilatation. The common bile duct is normal caliber without obstruction or choledocholithiasis. There is a gallstone within the gallbladder measuring 10 millimeters. No gallbladder inflammation identified. Pancreas: The pancreatic duct is thin and difficult to follow. No variant pancreatic ductal anatomy. The main  pancreatic duct joins the common bile duct at the ampulla. The duct is followed through the head and body (image 27, series 3). There is a short segment of pancreas were the duct is difficult to follow. This is the same segment where there is a loss of enhancement on the post-contrast imaging (image 27, series 3 ; image 47, series 1302). Concern for pancreatic ductal interruption at this level. This region is the same region which was de vascularized on comparison and continues to demonstrate absent parenchymal enhancement. This region measures 2 centimeters in length (image 47, series 1301). The tail the pancreas has normal enhancement. The fluid collections along the body and tail the pancreas are now organized with enhancing thin rim. Fluid collections appear to communicate with the epicenter surrounding the region of pancreatic devascularization. The collection positioned between the stomach, pancreatic tail and spleen measures 9.7 x 4.7 centimeters (image 36, series 1302). Collection along the body of the pancreas measures 3.4 x 7.2 centimeters. The enhancing collections extend inferiorly along the anterior para renal fascia on the LEFT (image 60, series 1302). Spleen: Normal spleen. Adrenals/urinary tract: Adrenal glands and kidneys are normal. Stomach/Bowel: Stomach and limited of the small bowel is unremarkable Vascular/Lymphatic: Abdominal aortic normal caliber. No retroperitoneal periportal lymphadenopathy. Musculoskeletal: No aggressive osseous lesion IMPRESSION: 1. Fluid collections surrounding the body and tail the pancreas are more organized with thin enhancing rim consistent with pseudocyst formation. 2. Peripancreatic enhancing fluid collections have epicenter at a region devascularization of the mid pancreas. This pancreatic duct is difficult to follow through this region of devascularization with concern for ductal interruption at this level. The tail the pancreas is normal with normal enhanced.  The distal body and head of the pancreas are normal. 3. No variant pancreatic ductal anatomy. 4. Common bile duct is normal caliber without obstruction. 5. Gallstones within the gallbladder. 6. No intrahepatic biliary duct dilatation.  Electronically Signed   By: Genevive Bi M.D.   On: 01/17/2016 08:30   Admission HPI:   Mr. Enberg is a 40 year old man with history of hypertension and hyperlipidemia presenting with acute onset abdominal pain.  Last night around 1:30 AM, he started feeling a sharp periumbilical pain, nausea, and vomiting. The pain spread across his lower abdomen and he continued to feel nauseous, so he came into the emergency department. He denies any fevers, he rarely drinks alcohol (his last drink was a shot on New Year's Eve), he has never had gallstones, and he has been all for his medications for the last 2-3 years, without any new changes. He hasn't started taking any new NSAIDs, steroids, or herbal supplements. Prior to his acute onset abdominal pain, he was feeling very well, denies any rashes, weight loss, joint pains, and there are no autoimmune diseases in his family that he is aware of.  Hospital Course by problem list:  1. Acute pancreatitis: He presented with acute onset periumbilical abdominal pain, and was found to have a lipase of greater than 3000 and abdominal CT showed acute pancreatitis without necrosis. He does not drink alcohol and did not have choledocholithiasis noted on his abdominal CT, abdominal ultrasound, nor biliary obstructive pattern on liver function tests; therefore we speculated his acute pancreatitis was either medication induced, autoimmune, or idiopathic. For the medications, lisinopril, atorvastatin, and fenofibrate were all considered potential culprits. We held these during the course of his stay. We also checked an IgG4 level to look for autoimmune pancreatitis given his [name] ancestry; this was normal. There were non-obstructing  gallstones so general surgery recommended outpatient cholecystectomy upon discharge.  He was started on IV fluids, made nothing by mouth, and we treated his abdominal pain with Dilaudid PCA and his nausea with ondansetron. He was very, very slow to improve. He began spiking fevers on 1/11, so another abdominal CT scan was ordered that showed new pancreatic necrosis. He was started on imipenem-cilastain on 1/12. He defervesced, and his leukocytosis improved, so we stopped this on 1/19. We tried to place a postpyloric feeding tube, but he was incredibly nauseous, was unable to tolerate this. We started TPN on 1/12 and this was continued until he was able to take in appropriate oral intake. He was very slow to improve clinically so we ordered an MRCP to look for a biliary stricture on 1/20 that showed normal biliary anatomy but interval development of a pancreatic pseudocyst. He was gradually weaned off of the Dilaudid PCA, and was able to eat and was gradually weaned off of the PCA pump and transitioned to oral analgesics. We held his home medications upon discharge.  2. Hypertension: His blood pressures normalized with resolution of his pain. We held his lisinopril 5mg  daily because we were concerned this could be contributing to medication-induced pancreatitis. He was normotensive for the duration of his stay.  3. Hyperlipidemia: He was on atorvastatin and fenofibrate when he presented. We held his medications because we thought they may be contributing to medication induced pancreatitis.  4. Majocchi's granuloma: He had pruritic folliculocentric erythematous papules coalescing into plaques with overlying scale on his bilateral buttocks extending over his lateral thighs that appeared to be admitted to be Majocchi's granuloma. Bacterial folliculitis was another consideration, but I doubted this as he had been on broad-spectrum antibiotics when the eruption began. I obtained a KOH scraping that did not show  fungus. He was started on fluconazole 200 mg daily IV as he could  not swallow pills; the lesions improved considerably but he became nauseous so fluconazole was stopped.   Discharge Vitals:   BP 128/84 mmHg  Pulse 101  Temp(Src) 98.1 F (36.7 C) (Oral)  Resp 20  Ht 5\' 4"  (1.626 m)  Wt 76.2 kg (167 lb 15.9 oz)  BMI 28.82 kg/m2  SpO2 98%  Discharge Labs:  No results found for this or any previous visit (from the past 24 hour(s)).  Signed: Selina Cooley, MD 02/03/2016, 10:33 AM

## 2016-01-05 NOTE — ED Notes (Signed)
Patient returned from CT

## 2016-01-05 NOTE — ED Provider Notes (Signed)
CSN: [id number]     Arrival date & time 01/05/16  0557 History   First MD Initiated Contact with Patient 01/05/16 [phone]     Chief Complaint  Patient presents with  . Abdominal Pain    HPI    40 year old male presents today with left lower quadrant pain. Patient reports symptoms started approximately 1:30 this morning with nausea and vomiting 7. Arterial onset of symptoms he was feeling well, no history of the same. Patient denies any fever, chills, diarrhea, alcohol or drug use. Patient notes yesterday he had fish jerky male from [place], but other members of the household also consume this and had not had any symptoms. Pt has a history of hyperlipidemia but has had recent lab work in the last 2 months.  Past Medical History  Diagnosis Date  . Hypertension    History reviewed. No pertinent past surgical history. History reviewed. No pertinent family history. Social History  Substance Use Topics  . Smoking status: Never Smoker   . Smokeless tobacco: None  . Alcohol Use: Yes     Comment: rarely    Review of Systems  All other systems reviewed and are negative.   Allergies  Penicillins  Home Medications   Prior to Admission medications   Medication Sig Start Date End Date Taking? Authorizing Provider  atorvastatin (LIPITOR) 10 MG tablet Take 10 mg by mouth daily.   Yes Historical Provider, MD  buPROPion (WELLBUTRIN XL) 150 MG 24 hr tablet Take 150 mg by mouth daily.   Yes Historical Provider, MD  fenofibrate (TRICOR) 145 MG tablet Take 145 mg by mouth daily.   Yes Historical Provider, MD  lisinopril (PRINIVIL,ZESTRIL) 5 MG tablet Take 5 mg by mouth daily.   Yes Historical Provider, MD   BP 161/113 mmHg  Pulse 100  Temp(Src) 98.2 F (36.8 C) (Oral)  Resp 20  SpO2 100%   Physical Exam  Constitutional: He is oriented to person, place, and time. He appears well-developed and well-nourished. He appears distressed.  HENT:  Head: Normocephalic and atraumatic.  Eyes:  Conjunctivae are normal. Pupils are equal, round, and reactive to light. Right eye exhibits no discharge. Left eye exhibits no discharge. No scleral icterus.  Neck: Normal range of motion. No JVD present. No tracheal deviation present.  Pulmonary/Chest: Effort normal. No stridor.  Abdominal: He exhibits no distension and no mass. There is tenderness. There is guarding. There is no rebound.  LLQ/periumbilical pain to light palpation  Musculoskeletal: He exhibits no edema.  Neurological: He is alert and oriented to person, place, and time. Coordination normal.  Skin: Skin is warm and dry. No rash noted. No erythema. No pallor.  Psychiatric: He has a normal mood and affect. His behavior is normal. Judgment and thought content normal.  Nursing note and vitals reviewed.   ED Course  Procedures (including critical care time) Labs Review Labs Reviewed  CBC WITH DIFFERENTIAL/PLATELET - Abnormal; Notable for the following:    WBC 16.8 (*)    RBC 6.15 (*)    Neutro Abs 14.3 (*)    Monocytes Absolute 1.2 (*)    All other components within normal limits  COMPREHENSIVE METABOLIC PANEL - Abnormal; Notable for the following:    Glucose, Bld 136 (*)    AST 581 (*)    ALT 383 (*)    All other components within normal limits  LIPASE, BLOOD - Abnormal; Notable for the following:    Lipase >3000 (*)    All other components within normal  limits  URINALYSIS, ROUTINE W REFLEX MICROSCOPIC (NOT AT [facility])  TRIGLYCERIDES    Imaging Review Ct Abdomen Pelvis W Contrast  01/05/2016  CLINICAL DATA:  Left lower quadrant pain radiating to right side, with nausea and vomiting. EXAM: CT ABDOMEN AND PELVIS WITH CONTRAST TECHNIQUE: Multidetector CT imaging of the abdomen and pelvis was performed using the standard protocol following bolus administration of intravenous contrast. CONTRAST:  OMNIPAQUE IOHEXOL 300 MG/ML  SOLN COMPARISON:  None. FINDINGS: Lower chest:  No acute findings. Hepatobiliary: Liver is low in  density suggesting fatty infiltration. Small layering gallstones appreciated within the nondistended gallbladder. Gallbladder walls appear thickened, likely reactive in nature. Pancreas: Ill-defined fluid surrounding the pancreas, with extension into the right upper quadrant and left upper quadrant, consistent with acute pancreatitis. At least mild edema within the distal pancreatic body. No evidence of parenchymal necrosis or peripancreatic hemorrhage. No pancreatic mass or pancreatic duct dilatation appreciated. No evidence of pseudocyst formation. Spleen: Within normal limits in size and appearance. Adrenals/Urinary Tract: Adrenal glands appear normal. Kidneys are unremarkable without stone or hydronephrosis. No ureteral or bladder calculi identified. Stomach/Bowel: Bowel is normal in caliber. No bowel wall thickening or evidence of bowel wall inflammation seen. Scattered mild diverticulosis noted without evidence of acute diverticulitis. Appendix is unremarkable. Vascular/Lymphatic: No pathologically enlarged lymph nodes. No evidence of abdominal aortic aneurysm. Reproductive: No mass or other significant abnormality. Other: None. Musculoskeletal: Mild degenerative change within the lumbar spine. No acute osseous abnormality. Superficial soft tissues are unremarkable. IMPRESSION: 1. Acute pancreatitis, with moderate amount of ill-defined fluid surrounding the pancreas and extending into the adjacent left upper quadrant and right upper quadrant. Associated edema within the distal pancreatic body. No complicating features. 2. Fatty infiltration of the liver. 3. Cholelithiasis. Gallbladder wall thickening is likely reactive to the adjacent pancreatitis and/or liver disease. 4. Mild colonic diverticulosis without evidence of acute diverticulitis. Electronically Signed   By: Bary Richard M.D.   On: 01/05/2016 08:38   I have personally reviewed and evaluated these images and lab results as part of my medical  decision-making.   EKG Interpretation None     MDM   Final diagnoses:  Acute pancreatitis, unspecified pancreatitis type    Labs: CBC, CMP, Lipase -   Imaging: CT abd pelvis with contrast - acute pancreatitis with moderate ill-defined fluid surrounding the pancreas extending into the adjacent left upper quadrant and right upper quadrant. Associated edema within the distal pancreatic body. Patient also has cholelithiasis with gallbladder wall thickening likely reactive to adjacent pancreatitis and/or liver disease  Consults: Internal med teaching- Dr. Beckie Salts   Therapeutics: Normal saline, fentanyl, Dilaudid  Discharge Meds:   Assessment/Plan: 40 year old male presents today with acute pancreatitis. Patient denies a history of cholelithiasis, CT scan shows cholelithiasis. Patient nausea or vomiting controlled here in the ED with antinausea medications, patient's pain managed with IV medications to some extent. Patient continues to have recurring pain. Patient is nontoxic-appearing, afebrile. Patient will be admitted to the hospitalist service for further evaluation and management. Spoke with internal medicine teaching service who would come down and evaluate and admit patient.        Eyvonne Mechanic, PA-C 01/05/16 1018  Tomasita Crumble, MD 01/05/16 [phone]

## 2016-01-06 ENCOUNTER — Inpatient Hospital Stay (HOSPITAL_COMMUNITY): Payer: BLUE CROSS/BLUE SHIELD

## 2016-01-06 LAB — COMPREHENSIVE METABOLIC PANEL
ALK PHOS: 31 U/L — AB (ref 38–126)
ALT: 174 U/L — AB (ref 17–63)
AST: 99 U/L — AB (ref 15–41)
Albumin: 3.6 g/dL (ref 3.5–5.0)
Anion gap: 10 (ref 5–15)
BUN: 8 mg/dL (ref 6–20)
CALCIUM: 7.9 mg/dL — AB (ref 8.9–10.3)
CHLORIDE: 101 mmol/L (ref 101–111)
CO2: 27 mmol/L (ref 22–32)
CREATININE: 1 mg/dL (ref 0.61–1.24)
GFR calc non Af Amer: >60 mL/min (ref >60)
Glucose, Bld: 116 mg/dL — ABNORMAL HIGH (ref 65–99)
Potassium: 3 mmol/L — ABNORMAL LOW (ref 3.5–5.1)
SODIUM: 138 mmol/L (ref 135–145)
Total Bilirubin: 1.7 mg/dL — ABNORMAL HIGH (ref 0.3–1.2)
Total Protein: 6.1 g/dL — ABNORMAL LOW (ref 6.5–8.1)

## 2016-01-06 LAB — CBC
HCT: 44.2 % (ref 39.0–52.0)
HEMOGLOBIN: 14.6 g/dL (ref 13.0–17.0)
MCH: 26.6 pg (ref 26.0–34.0)
MCHC: 33 g/dL (ref 30.0–36.0)
MCV: 80.7 fL (ref 78.0–100.0)
PLATELETS: 268 10*3/uL (ref 150–400)
RBC: 5.48 MIL/uL (ref 4.22–5.81)
RDW: 13.8 % (ref 11.5–15.5)
WBC: 15 10*3/uL — AB (ref 4.0–10.5)

## 2016-01-06 LAB — HIV ANTIBODY (ROUTINE TESTING W REFLEX): HIV Screen 4th Generation wRfx: NONREACTIVE

## 2016-01-06 MED ORDER — SODIUM CHLORIDE 0.45 % IV SOLN
INTRAVENOUS | Status: AC
  Administered 2016-01-06 – 2016-01-07 (×2): via INTRAVENOUS
  Filled 2016-01-06 (×2): qty 1000

## 2016-01-06 MED ORDER — SODIUM CHLORIDE 0.9 % IJ SOLN: 9.0000 mL | INTRAMUSCULAR | Status: DC | PRN

## 2016-01-06 MED ORDER — ONDANSETRON HCL 4 MG/2ML IJ SOLN: 4.0000 mg | Freq: Four times a day (QID) | INTRAMUSCULAR | Status: DC | PRN

## 2016-01-06 MED ORDER — DIPHENHYDRAMINE HCL 50 MG/ML IJ SOLN: 12.5000 mg | Freq: Four times a day (QID) | INTRAMUSCULAR | Status: DC | PRN

## 2016-01-06 MED ORDER — DIPHENHYDRAMINE HCL 12.5 MG/5ML PO ELIX: 12.5000 mg | ORAL_SOLUTION | Freq: Four times a day (QID) | ORAL | Status: DC | PRN

## 2016-01-06 MED ORDER — SODIUM CHLORIDE 0.45 % IV SOLN: INTRAVENOUS | Status: DC

## 2016-01-06 MED ORDER — NALOXONE HCL 0.4 MG/ML IJ SOLN: 0.4000 mg | INTRAMUSCULAR | Status: DC | PRN

## 2016-01-06 MED ORDER — NALOXONE HCL 0.4 MG/ML IJ SOLN
INTRAMUSCULAR | Status: AC
  Filled 2016-01-06: qty 1

## 2016-01-06 MED ORDER — HYDROMORPHONE HCL 1 MG/ML IJ SOLN
INTRAMUSCULAR | Status: AC
  Administered 2016-01-06: 1 mg
  Filled 2016-01-06: qty 1

## 2016-01-06 MED ORDER — HYDROMORPHONE 1 MG/ML IV SOLN
INTRAVENOUS | Status: DC
Start: 2016-01-06 — End: 2016-01-08
  Administered 2016-01-06: 0.9 mg via INTRAVENOUS
  Administered 2016-01-06: 13:00:00 via INTRAVENOUS
  Administered 2016-01-06: 1.2 mL via INTRAVENOUS
  Administered 2016-01-07: 0.3 mL via INTRAVENOUS
  Administered 2016-01-07: 4.5 mg via INTRAVENOUS
  Administered 2016-01-07: 2.1 mg via INTRAVENOUS
  Administered 2016-01-07: 1.5 mg via INTRAVENOUS
  Administered 2016-01-07: 1.8 mg via INTRAVENOUS
  Administered 2016-01-07 – 2016-01-08 (×2): 2.7 mg via INTRAVENOUS
  Administered 2016-01-08: 2.4 mg via INTRAVENOUS
  Administered 2016-01-08: 04:00:00 via INTRAVENOUS
  Filled 2016-01-06 (×2): qty 25

## 2016-01-06 NOTE — Care Management (Signed)
Utilization review completed. Lula Michaux, RN Case Manager [phone]. 

## 2016-01-06 NOTE — Progress Notes (Signed)
Patient ID: Bernice Mullin, male   DOB: 04/28/1976, 40 y.o.   MRN: [id number]   Subjective: Mr. Embleton had a rough night last night; he tells me he was urinating every couple hours and his abdominal pain is not well controlled on the Dilaudid. The pain is not getting worse, but is simply hanging around. His nausea is getting better.  Objective: Vital signs in last 24 hours: Filed Vitals:   01/05/16 1300 01/05/16 1342 01/05/16 1927 01/06/16 0503  BP: 151/107 169/105 136/76 137/90  Pulse: 95  104 107  Temp:   99.3 F (37.4 C) 98.5 F (36.9 C)  TempSrc:   Oral Oral  Resp:  18 18 18   SpO2: 94% 98% 95% 100%   General: [name] man resting in bed, slightly diaphoretic, looks slightly uncomfortable but conversing normally HEENT: no scleral icterus, extra-ocular muscles intact, oropharynx without lesions Cardiac: regular rate and rhythm, no rubs, murmurs or gallops Pulm: breathing well, clear to auscultation bilaterally Abd: bowel sounds hypoactive, tender throughout, but mostly in the epigastrium, no peritoneal signs, no Cullen or Turner sign, and otherwise soft, nondistended Ext: warm and well perfused, without pedal edema Lymph: no cervical or supraclavicular lymphadenopathy Skin: no rash, hair, or nail changes Neuro: alert and oriented X3, cranial nerves II-XII grossly intact, moving all extremities well  Lab Results: Basic Metabolic Panel:  Recent Labs Lab 01/05/16 0615 01/06/16 0537  NA 141 138  K 3.8 3.0*  CL 102 101  CO2 26 27  GLUCOSE 136* 116*  BUN 12 8  CREATININE 1.20 1.00  CALCIUM 9.6 7.9*   Liver Function Tests:  Recent Labs Lab 01/05/16 0615 01/06/16 0537  AST 581* 99*  ALT 383* 174*  ALKPHOS 56 31*  BILITOT 1.2 1.7*  PROT 7.4 6.1*  ALBUMIN 4.7 3.6   CBC:  Recent Labs Lab 01/05/16 0615 01/06/16 0537  WBC 16.8* 15.0*  NEUTROABS 14.3*  --   HGB 16.7 14.6  HCT 50.1 44.2  MCV 81.5 80.7  PLT 347 268   Medications: I have reviewed the  patient's current medications. Scheduled Meds: . enoxaparin (LOVENOX) injection  40 mg Subcutaneous Q24H  . HYDROmorphone   Intravenous 6 times per day   Continuous Infusions: . sodium chloride 0.45 % with kcl     PRN Meds:.diphenhydrAMINE **OR** diphenhydrAMINE, naloxone **AND** sodium chloride, ondansetron (ZOFRAN) IV   Assessment/Plan:  Mr. Shugart is a 40 year old [name] man with hypertension and hyperlipidemia here for acute pancreatitis without necrosis thought to either be due to his medications, a transiently passed gallstone, or perhaps autoimmune. His vitals were stable overnight, but he continues to have abdominal pain. We will start a patient controlled analgesic pump today, drop the rate of his fluids, check an IgG4 level, and advance his diet as tolerated.  Acute pancreatitis: Per above, I think this is most likely drug-induced, autoimmune, or transiently passed gallstone. -Holding home medications -Advancing diet as tolerated to promote early enteral feeding -Start PCA today -Half-normal saline at 100 mL/h with potassium -Ondansetron 8 mg every 6 hours as needed for nausea   Dispo: Disposition is deferred at this time, awaiting improvement of current medical problems.  Anticipated discharge in approximately 1-3 day(s).   The patient does have a current PCP [facility]) and does need an [facility] hospital follow-up appointment after discharge.  The patient does not have transportation limitations that hinder transportation to clinic appointments.  .Services Needed at time of discharge: Y = Yes, Blank = No  PT:   OT:   RN:   Equipment:   Other:     LOS: 1 day   Selina CooleyKyle Eilidh Marcano, MD 01/06/2016, 9:47 AM

## 2016-01-07 ENCOUNTER — Encounter (HOSPITAL_COMMUNITY): Payer: Self-pay | Admitting: General Surgery

## 2016-01-07 LAB — COMPREHENSIVE METABOLIC PANEL
ALK PHOS: 37 U/L — AB (ref 38–126)
ALT: 107 U/L — ABNORMAL HIGH (ref 17–63)
ANION GAP: 10 (ref 5–15)
AST: 57 U/L — ABNORMAL HIGH (ref 15–41)
Albumin: 3 g/dL — ABNORMAL LOW (ref 3.5–5.0)
BILIRUBIN TOTAL: 1.8 mg/dL — AB (ref 0.3–1.2)
BUN: 8 mg/dL (ref 6–20)
CALCIUM: 7.9 mg/dL — AB (ref 8.9–10.3)
CO2: 28 mmol/L (ref 22–32)
Chloride: 97 mmol/L — ABNORMAL LOW (ref 101–111)
Creatinine, Ser: 1.02 mg/dL (ref 0.61–1.24)
Glucose, Bld: 110 mg/dL — ABNORMAL HIGH (ref 65–99)
Potassium: 3.3 mmol/L — ABNORMAL LOW (ref 3.5–5.1)
SODIUM: 135 mmol/L (ref 135–145)
TOTAL PROTEIN: 5.9 g/dL — AB (ref 6.5–8.1)

## 2016-01-07 LAB — CBC
HEMATOCRIT: 42.1 % (ref 39.0–52.0)
HEMOGLOBIN: 13.9 g/dL (ref 13.0–17.0)
MCH: 26.8 pg (ref 26.0–34.0)
MCHC: 33 g/dL (ref 30.0–36.0)
MCV: 81.1 fL (ref 78.0–100.0)
Platelets: 222 10*3/uL (ref 150–400)
RBC: 5.19 MIL/uL (ref 4.22–5.81)
RDW: 13.8 % (ref 11.5–15.5)
WBC: 15 10*3/uL — ABNORMAL HIGH (ref 4.0–10.5)

## 2016-01-07 LAB — URINALYSIS, ROUTINE W REFLEX MICROSCOPIC
Glucose, UA: NEGATIVE mg/dL
KETONES UR: 15 mg/dL — AB
LEUKOCYTES UA: NEGATIVE
NITRITE: NEGATIVE
PROTEIN: 100 mg/dL — AB
Specific Gravity, Urine: 1.026 (ref 1.005–1.030)
pH: 6.5 (ref 5.0–8.0)

## 2016-01-07 LAB — URINE MICROSCOPIC-ADD ON

## 2016-01-07 MED ORDER — SODIUM CHLORIDE 0.45 % IV BOLUS
1000.0000 mL | Freq: Once | INTRAVENOUS | Status: AC
  Administered 2016-01-07: 1000 mL via INTRAVENOUS

## 2016-01-07 MED ORDER — BARIUM SULFATE 2.1 % PO SUSP
ORAL | Status: AC
  Filled 2016-01-07: qty 2

## 2016-01-07 MED ORDER — HYDROMORPHONE HCL 1 MG/ML IJ SOLN: 0.5000 mg | INTRAMUSCULAR | Status: DC | PRN

## 2016-01-07 MED ORDER — SODIUM CHLORIDE 0.45 % IV SOLN
INTRAVENOUS | Status: AC
  Administered 2016-01-07 (×2): via INTRAVENOUS
  Filled 2016-01-07 (×7): qty 1000

## 2016-01-07 MED ORDER — BARIUM SULFATE 2.1 % PO SUSP
450.0000 mL | ORAL | Status: AC
  Administered 2016-01-07: 450 mL via ORAL

## 2016-01-07 NOTE — Consult Note (Signed)
Drew Prince [birth year]  [id number].   Requesting MD: Dr. Lalla Brothers Chief Complaint/Reason for Consult: gallstone pancreatitis HPI: this is a pleasant 40 yo male who has a h/o HTN and hypercholesterolemia who woke up at 0130 Sunday morning with severe epigastric and LUQ abdominal pain.  He developed some nausea and vomiting.  He denies any fevers or chills.  He had never had anything like this before.  He was brought to the [facility] for further evaluation.  He was found to have a lipase >3000 with elevated transaminases.  He had a CT scan showing significant pancreatitis with fluid at the head and tail of the pancreas, but no evidence of necrosis or pseudocyst at that time.  He then had an abdominal US that revealed gallstones.  His transaminases are trending down, but his TB is trending slightly up.  Repeat lipase has not been checked yet.  He was initially given clear liquids on admission, but these made his pain worse.  He is currently NPO.  We have been asked to evaluate this patient for further recommendations.  ROS : Please see HPI, otherwise all other systems are currently negative.  History reviewed. No pertinent family history.  Past Medical History  Diagnosis Date  . Hypertension   . Hypercholesteremia     History reviewed. No pertinent past surgical history.  Social History:  reports that he has never smoked. He does not have any smokeless tobacco history on file. He reports that he drinks alcohol. He reports that he does not use illicit drugs.  Allergies:  Allergies  Allergen Reactions  . Penicillins Rash    Medications Prior to Admission  Medication Sig Dispense Refill  . atorvastatin (LIPITOR) 10 MG tablet Take 10 mg by mouth daily.    Marland Kitchen buPROPion (WELLBUTRIN XL) 150 MG 24 hr tablet Take 150 mg by mouth daily.    . fenofibrate (TRICOR) 145 MG tablet Take 160 mg by mouth daily.     Marland Kitchen lisinopril (PRINIVIL,ZESTRIL) 5 MG tablet Take 5 mg by mouth daily.      Blood  pressure 133/84, pulse 115, temperature 99.1 F (37.3 C), temperature source Oral, resp. rate 22, SpO2 95 %. Physical Exam: General: pleasant, WD, WN male who is laying in bed in NAD HEENT: head is normocephalic, atraumatic.  Sclera are noninjected.  PERRL.  Ears and nose without any masses or lesions.  Mouth is pink and moist Heart: regular rhythm, but tachy.  Normal s1,s2. No obvious murmurs, gallops, or rubs noted.  Palpable radial and pedal pulses bilaterally Lungs: CTAB, no wheezes, rhonchi, or rales noted.  Respiratory effort nonlabored Abd: distended, tympanitic, but with some active bowel sounds.  Diffusely tender with some voluntary guarding, but no concerns at this time for compartment syndrome, no masses, hernias, or organomegaly MS: all 4 extremities are symmetrical with no cyanosis, clubbing, or edema. Skin: warm and dry with no masses, lesions, or rashes Psych: A&Ox3 with an appropriate affect.    Results for orders placed or performed during the hospital encounter of 01/05/16 (from the past 48 hour(s))  CBC     Status: Abnormal   Collection Time: 01/06/16  5:37 AM  Result Value Ref Range   WBC 15.0 (H) 4.0 - 10.5 K/uL   RBC 5.48 4.22 - 5.81 MIL/uL   Hemoglobin 14.6 13.0 - 17.0 g/dL   HCT 44.2 39.0 - 52.0 %   MCV 80.7 78.0 - 100.0 fL   MCH 26.6 26.0 - 34.0 pg   MCHC 33.0  30.0 - 36.0 g/dL   RDW 85.3 14.0 - 82.4 %   Platelets 268 150 - 400 K/uL  Comprehensive metabolic panel     Status: Abnormal   Collection Time: 01/06/16  5:37 AM  Result Value Ref Range   Sodium 138 135 - 145 mmol/L   Potassium 3.0 (L) 3.5 - 5.1 mmol/L   Chloride 101 101 - 111 mmol/L   CO2 27 22 - 32 mmol/L   Glucose, Bld 116 (H) 65 - 99 mg/dL   BUN 8 6 - 20 mg/dL   Creatinine, Ser 3.25 0.61 - 1.24 mg/dL   Calcium 7.9 (L) 8.9 - 10.3 mg/dL   Total Protein 6.1 (L) 6.5 - 8.1 g/dL   Albumin 3.6 3.5 - 5.0 g/dL   AST 99 (H) 15 - 41 U/L   ALT 174 (H) 17 - 63 U/L   Alkaline Phosphatase 31 (L) 38 - 126  U/L   Total Bilirubin 1.7 (H) 0.3 - 1.2 mg/dL   GFR calc non Af Amer >60 >60 mL/min   GFR calc Af Amer >60 >60 mL/min    Comment: (NOTE) The eGFR has been calculated using the CKD EPI equation. This calculation has not been validated in all clinical situations. eGFR's persistently <60 mL/min signify possible Chronic Kidney Disease.    Anion gap 10 5 - 15  HIV antibody     Status: None   Collection Time: 01/06/16  5:37 AM  Result Value Ref Range   HIV Screen 4th Generation wRfx Non Reactive Non Reactive    Comment: (NOTE) Performed At: [facility] [address] [place], [place] [id number] Mila Homer MD XL:[id number]   CBC     Status: Abnormal   Collection Time: 01/07/16  4:40 AM  Result Value Ref Range   WBC 15.0 (H) 4.0 - 10.5 K/uL   RBC 5.19 4.22 - 5.81 MIL/uL   Hemoglobin 13.9 13.0 - 17.0 g/dL   HCT 43.8 14.6 - 27.4 %   MCV 81.1 78.0 - 100.0 fL   MCH 26.8 26.0 - 34.0 pg   MCHC 33.0 30.0 - 36.0 g/dL   RDW 02.5 36.7 - 24.5 %   Platelets 222 150 - 400 K/uL  Comprehensive metabolic panel     Status: Abnormal   Collection Time: 01/07/16  4:40 AM  Result Value Ref Range   Sodium 135 135 - 145 mmol/L   Potassium 3.3 (L) 3.5 - 5.1 mmol/L   Chloride 97 (L) 101 - 111 mmol/L   CO2 28 22 - 32 mmol/L   Glucose, Bld 110 (H) 65 - 99 mg/dL   BUN 8 6 - 20 mg/dL   Creatinine, Ser 8.00 0.61 - 1.24 mg/dL   Calcium 7.9 (L) 8.9 - 10.3 mg/dL   Total Protein 5.9 (L) 6.5 - 8.1 g/dL   Albumin 3.0 (L) 3.5 - 5.0 g/dL   AST 57 (H) 15 - 41 U/L   ALT 107 (H) 17 - 63 U/L   Alkaline Phosphatase 37 (L) 38 - 126 U/L   Total Bilirubin 1.8 (H) 0.3 - 1.2 mg/dL   GFR calc non Af Amer >60 >60 mL/min   GFR calc Af Amer >60 >60 mL/min    Comment: (NOTE) The eGFR has been calculated using the CKD EPI equation. This calculation has not been validated in all clinical situations. eGFR's persistently <60 mL/min signify possible Chronic Kidney Disease.    Anion gap 10 5 - 15  Urinalysis,  Routine w reflex microscopic (not  at [facility])     Status: Abnormal   Collection Time: 01/07/16  8:45 AM  Result Value Ref Range   Color, Urine AMBER (A) YELLOW    Comment: BIOCHEMICALS MAY BE AFFECTED BY COLOR   APPearance CLEAR CLEAR   Specific Gravity, Urine 1.026 1.005 - 1.030   pH 6.5 5.0 - 8.0   Glucose, UA NEGATIVE NEGATIVE mg/dL   Hgb urine dipstick SMALL (A) NEGATIVE   Bilirubin Urine SMALL (A) NEGATIVE   Ketones, ur 15 (A) NEGATIVE mg/dL   Protein, ur 100 (A) NEGATIVE mg/dL   Nitrite NEGATIVE NEGATIVE   Leukocytes, UA NEGATIVE NEGATIVE  Urine microscopic-add on     Status: Abnormal   Collection Time: 01/07/16  8:45 AM  Result Value Ref Range   Squamous Epithelial / LPF 0-5 (A) NONE SEEN   WBC, UA 0-5 0 - 5 WBC/hpf   RBC / HPF 0-5 0 - 5 RBC/hpf   Bacteria, UA FEW (A) NONE SEEN   Urine-Other MUCOUS PRESENT    US Abdomen Complete  01/06/2016  CLINICAL DATA:  Acute pancreatitis, fatty infiltrative change of the liver, gallstones, ascites. EXAM: ABDOMEN ULTRASOUND COMPLETE COMPARISON:  Abdominal and pelvic CT scan of January 05, 2016 FINDINGS: Gallbladder: The gallbladder is adequately distended. There are multiple echogenic shadowing stones measuring up to 1.2 cm. There is no gallbladder wall thickening, pericholecystic fluid, or positive sonographic Murphy's sign. Common bile duct: Diameter: 5.3 mm Liver: The liver exhibits increased echotexture diffusely compatible with fatty infiltrative change. There is no focal mass or ductal dilation. IVC: No abnormality visualized. Pancreas: The pancreatic bed is partially obscured by bowel gas. Only a small portion of the pancreatic body is demonstrated. Spleen: Size and appearance within normal limits. Right Kidney: Length: 12 cm. Echogenicity within normal limits. No mass or hydronephrosis visualized. Left Kidney: Length: 12.4 cm. Echogenicity within normal limits. No mass or hydronephrosis visualized. Abdominal aorta: Visualization of the mid  aorta is limited by bowel gas. The proximal and distal aorta are normal in caliber. Other findings: None. IMPRESSION: 1. Limited visualization of the pancreas. 2. Gallstones without sonographic and evidence of acute cholecystitis. Fatty infiltrative change of the liver. 3. No acute abnormality is observed elsewhere within the abdomen. Electronically Signed   By: David  Martinique M.D.   On: 01/06/2016 12:22       Assessment/Plan 1. Gallstone pancreatitis -the patient has a significant pancreatitis seen on his CT scan, likely secondary to gallstones given their presence as well as his elevated LFTs.  The patient's pancreatitis will have to resolve prior to surgical resection of his gallbladder.  Given his currently symptoms as well as his CT scan findings, he may be someone who has to have his cholecystectomy electively as an outpatient due to the significance of his pancreatitis.  Cholecystectomies with ongoing pancreatitis are not recommended and not standard of care as there is an elevated risk of complications. -I have drawn a picture and discussed the anatomy of this process with the patient and his wife.  They understand. -clinically the patient is acting appropriate for someone with pancreatitis.  I do not see a need to repeat an imaging at this time or the need for abx therapy.  If his TB continues to trend up, he may require an MRCP to rule out a CBD stone.  His pancreatitis may take several days to several weeks to resolve. -cont NPO and aggressive IVF resuscitation. -in the future, if the patient fails po challenge, he may need  another form of intake for nutrition such as a PANDA tube etc; however, this will be a ways down the road, if he does not improve. -we will follow along with you.  Liane Tribbey E 01/07/2016, 12:26 PM Pager: [phone]

## 2016-01-07 NOTE — Progress Notes (Signed)
Patient ID: Drew Prince, male   DOB: 1976-06-23, 40 y.o.   MRN: [id number]   Subjective: Drew Prince continues to have severe abdominal pain despite starting the PCA pump yesterday. His nausea has improved only a little bit. His bili feels little more distended, and he has not had a bowel movement in 3 days and doesn't feel like he is passing gas.  Objective: Vital signs in last 24 hours: Filed Vitals:   01/07/16 0016 01/07/16 0408 01/07/16 0654 01/07/16 0744  BP:   133/84   Pulse:   115   Temp:   99.1 F (37.3 C)   TempSrc:      Resp: 18 24 23 22   SpO2: 96% 95% 94% 95%   General: More diaphoretic today than yesterday, wincing in pain when moving HEENT: no scleral icterus, extra-ocular muscles intact, oropharynx without lesions Cardiac: Tachycardic but regular rhythm, no rubs, murmurs or gallops Pulm: A little more tachypnea today, but clear to auscultation bilaterally wtihout crackles Abd: Abdomen more distended today, diffusely more tender throughout without rebound, guarding, or point-tenderness Ext: warm and well perfused, with 1+ pedal edema Lymph: no cervical or supraclavicular lymphadenopathy Skin: no rash, hair, or nail changes Neuro: alert and oriented X3, cranial nerves II-XII grossly intact, moving all extremities well  Lab Results: Basic Metabolic Panel:  Recent Labs Lab 01/06/16 0537 01/07/16 0440  NA 138 135  K 3.0* 3.3*  CL 101 97*  CO2 27 28  GLUCOSE 116* 110*  BUN 8 8  CREATININE 1.00 1.02  CALCIUM 7.9* 7.9*   Liver Function Tests:  Recent Labs Lab 01/06/16 0537 01/07/16 0440  AST 99* 57*  ALT 174* 107*  ALKPHOS 31* 37*  BILITOT 1.7* 1.8*  PROT 6.1* 5.9*  ALBUMIN 3.6 3.0*   CBC:  Recent Labs Lab 01/05/16 0615 01/06/16 0537 01/07/16 0440  WBC 16.8* 15.0* 15.0*  NEUTROABS 14.3*  --   --   HGB 16.7 14.6 13.9  HCT 50.1 44.2 42.1  MCV 81.5 80.7 81.1  PLT 347 268 222   Studies/Results: US Abdomen Complete  01/06/2016  CLINICAL  DATA:  Acute pancreatitis, fatty infiltrative change of the liver, gallstones, ascites. EXAM: ABDOMEN ULTRASOUND COMPLETE COMPARISON:  Abdominal and pelvic CT scan of January 05, 2016 FINDINGS: Gallbladder: The gallbladder is adequately distended. There are multiple echogenic shadowing stones measuring up to 1.2 cm. There is no gallbladder wall thickening, pericholecystic fluid, or positive sonographic Murphy's sign. Common bile duct: Diameter: 5.3 mm Liver: The liver exhibits increased echotexture diffusely compatible with fatty infiltrative change. There is no focal mass or ductal dilation. IVC: No abnormality visualized. Pancreas: The pancreatic bed is partially obscured by bowel gas. Only a small portion of the pancreatic body is demonstrated. Spleen: Size and appearance within normal limits. Right Kidney: Length: 12 cm. Echogenicity within normal limits. No mass or hydronephrosis visualized. Left Kidney: Length: 12.4 cm. Echogenicity within normal limits. No mass or hydronephrosis visualized. Abdominal aorta: Visualization of the mid aorta is limited by bowel gas. The proximal and distal aorta are normal in caliber. Other findings: None. IMPRESSION: 1. Limited visualization of the pancreas. 2. Gallstones without sonographic and evidence of acute cholecystitis. Fatty infiltrative change of the liver. 3. No acute abnormality is observed elsewhere within the abdomen. Electronically Signed   By: David  [place] M.D.   On: 01/06/2016 12:22    Medications: I have reviewed the patient's current medications. Scheduled Meds: . enoxaparin (LOVENOX) injection  40 mg Subcutaneous Q24H  . HYDROmorphone  Intravenous 6 times per day   Continuous Infusions: . sodium chloride 0.45 % with kcl     PRN Meds:.diphenhydrAMINE **OR** diphenhydrAMINE, naloxone **AND** sodium chloride, ondansetron (ZOFRAN) IV   Assessment/Plan:  Drew Prince is a 40 year old [place] man here for pancreatitis that has failed to improve  with conservative management after 2 days. Today, his belly is more distended, he is tachycardic, tachypneic, and his fever curve is trending up; I'm concerned his pancreas may be starting to necrose, or he could have an ileus. Regardless, the treatment is the same. We will get surgery involved in case his course starts to spiral downward. As of now, we believe this is either medication induced or autoimmune; IgG4 level is pending. He got a right upper quadrant ultrasound yesterday that did not show any biliary duct dilation and he did not have an obstructive liver enzyme pattern suggestive of choledocholithiasis. However, his bilirubin is trending upward from 1.2 to 1.8 today. We'll continue to keep an eye on this because we can't confidently rule out choledocholithiasis. His urine output overnight was only about 200 mL so we'll bolus him 1L and increase his fluids to 14750mL/hr. We will check a postvoid residual and urinalysis as well but his creatinine is reassuringly normal; I wonder if he is starting to third-space into his abdomen. If he is not improving by tomorrow, we may want to consider getting post-pyloric feeding tube as he is now 3 days without eating.  Acute pancreatitis: Per above, I think this is most likely drug-induced, autoimmune, or transiently passed gallstone. -Consulted general surgery -Bolused 1L and increased normal saline to 150 mL/h with 40mEq potassium -Continue PCA -Holding home medications -Advancing diet as tolerated to promote early enteral feeding; he may need a feeding tube in the next few days -Ondansetron 8 mg every 6 hours as needed for nausea  Dispo: Disposition is deferred at this time, awaiting improvement of current medical problems.  The patient does have a current PCP [facility] and [facility].  The patient does not know have transportation limitations that hinder  transportation to clinic appointments.  .Services Needed at time of discharge: Y = Yes, Blank = No PT:   OT:   RN:   Equipment:   Other:     LOS: 2 days   Selina CooleyKyle Tiny Rietz, MD 01/07/2016, 8:07 AM

## 2016-01-08 LAB — CBC
HEMATOCRIT: 40.4 % (ref 39.0–52.0)
Hemoglobin: 13.2 g/dL (ref 13.0–17.0)
MCH: 26.2 pg (ref 26.0–34.0)
MCHC: 32.7 g/dL (ref 30.0–36.0)
MCV: 80.2 fL (ref 78.0–100.0)
Platelets: 259 10*3/uL (ref 150–400)
RBC: 5.04 MIL/uL (ref 4.22–5.81)
RDW: 13.5 % (ref 11.5–15.5)
WBC: 11.8 10*3/uL — ABNORMAL HIGH (ref 4.0–10.5)

## 2016-01-08 LAB — COMPREHENSIVE METABOLIC PANEL
ALK PHOS: 38 U/L (ref 38–126)
ALT: 67 U/L — ABNORMAL HIGH (ref 17–63)
AST: 32 U/L (ref 15–41)
Albumin: 2.6 g/dL — ABNORMAL LOW (ref 3.5–5.0)
Anion gap: 10 (ref 5–15)
BILIRUBIN TOTAL: 1.6 mg/dL — AB (ref 0.3–1.2)
BUN: 10 mg/dL (ref 6–20)
CALCIUM: 8.1 mg/dL — AB (ref 8.9–10.3)
CO2: 28 mmol/L (ref 22–32)
Chloride: 96 mmol/L — ABNORMAL LOW (ref 101–111)
Creatinine, Ser: 0.87 mg/dL (ref 0.61–1.24)
GFR calc Af Amer: >60 mL/min (ref >60)
Glucose, Bld: 91 mg/dL (ref 65–99)
POTASSIUM: 4.1 mmol/L (ref 3.5–5.1)
Sodium: 134 mmol/L — ABNORMAL LOW (ref 135–145)
TOTAL PROTEIN: 5.6 g/dL — AB (ref 6.5–8.1)

## 2016-01-08 LAB — IGG 4: IgG, Subclass 4: 5 mg/dL (ref 1–291)

## 2016-01-08 LAB — LIPASE, BLOOD: LIPASE: 57 U/L — AB (ref 11–51)

## 2016-01-08 MED ORDER — DIPHENHYDRAMINE HCL 50 MG/ML IJ SOLN: 12.5000 mg | Freq: Four times a day (QID) | INTRAMUSCULAR | Status: DC | PRN

## 2016-01-08 MED ORDER — HYDROMORPHONE 1 MG/ML IV SOLN
INTRAVENOUS | Status: DC
  Administered 2016-01-08: 5.4 mg via INTRAVENOUS
  Administered 2016-01-09: 8 mg via INTRAVENOUS
  Administered 2016-01-09: 1.5 mg via INTRAVENOUS
  Administered 2016-01-09: 1 mL via INTRAVENOUS
  Administered 2016-01-10: 6.5 mg via INTRAVENOUS
  Administered 2016-01-10: 01:00:00 via INTRAVENOUS
  Administered 2016-01-10: 7 mg via INTRAVENOUS
  Administered 2016-01-10: 17:00:00 via INTRAVENOUS
  Administered 2016-01-11: 6 mg via INTRAVENOUS
  Filled 2016-01-08 (×3): qty 25

## 2016-01-08 MED ORDER — NALOXONE HCL 0.4 MG/ML IJ SOLN: 0.4000 mg | INTRAMUSCULAR | Status: DC | PRN

## 2016-01-08 MED ORDER — HYDROMORPHONE 1 MG/ML IV SOLN
INTRAVENOUS | Status: DC
  Administered 2016-01-08: 1 mg via INTRAVENOUS

## 2016-01-08 MED ORDER — SODIUM CHLORIDE 0.9 % IJ SOLN: 9.0000 mL | INTRAMUSCULAR | Status: DC | PRN

## 2016-01-08 MED ORDER — HYDROMORPHONE 1 MG/ML IV SOLN: INTRAVENOUS | Status: DC

## 2016-01-08 MED ORDER — DEXTROSE-NACL 5-0.9 % IV SOLN
INTRAVENOUS | Status: DC
  Administered 2016-01-08 – 2016-01-10 (×5): via INTRAVENOUS

## 2016-01-08 MED ORDER — ONDANSETRON HCL 4 MG/2ML IJ SOLN
4.0000 mg | Freq: Four times a day (QID) | INTRAMUSCULAR | Status: DC | PRN
  Administered 2016-01-11 – 2016-01-22 (×12): 4 mg via INTRAVENOUS
  Administered 2016-01-22: 2 mg via INTRAVENOUS
  Administered 2016-01-22 – 2016-01-24 (×6): 4 mg via INTRAVENOUS
  Filled 2016-01-08 (×19): qty 2

## 2016-01-08 MED ORDER — DIPHENHYDRAMINE HCL 12.5 MG/5ML PO ELIX
12.5000 mg | ORAL_SOLUTION | Freq: Four times a day (QID) | ORAL | Status: DC | PRN
  Administered 2016-01-14: 12.5 mg via ORAL
  Filled 2016-01-08: qty 10

## 2016-01-08 NOTE — Progress Notes (Signed)
Patient ID: Drew Prince, male   DOB: 08/31/1976, 40 y.o.   MRN: [id number]   Subjective: Drew Prince feels about the same today compared to yesterday. His belly feels more distended, and he is not passing gas nor is he had bowel movement in 4 days now. He tells [name] the PCA controls his pain when he presses it, but he should probably be pressing it more because it wears off quickly. He denies any fever, no nausea except when drinking the oral contrast.  Objective: Vital signs in last 24 hours: Filed Vitals:   01/07/16 2109 01/08/16 0024 01/08/16 0329 01/08/16 0602  BP: 139/93   139/88  Pulse: 115   110  Temp: 98.1 F (36.7 C)   98.7 F (37.1 C)  TempSrc:      Resp: 16 20 23 18   SpO2: 96% 96% 96% 98%   General: resting in bed, diaphoretic, hardly moving because of the pain Cardiac: regular rate and rhythm, no rubs, murmurs or gallops Pulm: breathing well, clear to auscultation bilaterally Abd: bowel sounds hyperactive, slightly more distended today than yesterday day, still diffusely tender throughout, without rebound, guarding, or point tenderness Ext: warm and well perfused, without pedal edema Lymph: no cervical or supraclavicular lymphadenopathy Skin: no rash, hair, or nail changes Neuro: alert and oriented X3, cranial nerves II-XII grossly intact, moving all extremities well  Lab Results: Basic Metabolic Panel:  Recent Labs Lab 01/06/16 0537 01/07/16 0440  NA 138 135  K 3.0* 3.3*  CL 101 97*  CO2 27 28  GLUCOSE 116* 110*  BUN 8 8  CREATININE 1.00 1.02  CALCIUM 7.9* 7.9*   Liver Function Tests:  Recent Labs Lab 01/06/16 0537 01/07/16 0440  AST 99* 57*  ALT 174* 107*  ALKPHOS 31* 37*  BILITOT 1.7* 1.8*  PROT 6.1* 5.9*  ALBUMIN 3.6 3.0*   CBC:  Recent Labs Lab 01/05/16 0615  01/07/16 0440 01/08/16 0730  WBC 16.8*  < > 15.0* 11.8*  NEUTROABS 14.3*  --   --   --   HGB 16.7  < > 13.9 13.2  HCT 50.1  < > 42.1 40.4  MCV 81.5  < > 81.1 80.2  PLT 347   < > 222 259  < > = values in this interval not displayed.   Medications: I have reviewed the patient's current medications. Scheduled Meds: . enoxaparin (LOVENOX) injection  40 mg Subcutaneous Q24H  . HYDROmorphone   Intravenous 6 times per day   Continuous Infusions: . dextrose 5 % and 0.9% NaCl     PRN Meds:.diphenhydrAMINE **OR** diphenhydrAMINE, HYDROmorphone (DILAUDID) injection, naloxone **AND** sodium chloride, ondansetron (ZOFRAN) IV   Assessment/Plan:  [name] is here with acute pancreatitis, thought to be from transient gallstone passage, medication induced, or autoimmune. His abdomen appears more distended today; I wonder if he has an ileus from inflammation or all of the opiates he's been getting. We'll hold the course today by allowing him bowel rest, giving him fluids, and controlling his pain; I'll check his abdomen again this afternoon. If he clinically worsens, we'll re-scan him.  Acute pancreatitis: Per above, I think this is most likely drug-induced, autoimmune, or transiently passed gallstone. -General surgery is following -Continua D5 1/2 NS at 125cc/hr -Continue PCA; will make adjustments today -Holding home medications -Advancing diet as tolerated to promote early enteral feeding; he may need a feeding tube in the next few days -Ondansetron 4 mg every 6 hours as needed for nausea -Follow-up IgG4 level  Dispo: Disposition is deferred at this time, awaiting improvement of current medical problems.  Anticipated discharge in approximately 2-3 day(s).   The patient does have a current PCP [facility] and [facility] after [facility].  The patient does not know have transportation limitations that hinder transportation to clinic appointments.  .Services Needed at time of discharge: Y = Yes, Blank = No PT:   OT:   RN:   Equipment:   Other:     LOS: 3 days   Drew CooleyKyle Hilmer Aliberti, MD 01/08/2016,  8:31 AM

## 2016-01-08 NOTE — Progress Notes (Signed)
  Subjective: Pt con't with pain Some nausea  Objective: Vital signs in last 24 hours: Temp:  [98.1 F (36.7 C)-99.5 F (37.5 C)] 98.7 F (37.1 C) (01/11 0602) Pulse Rate:  [110-115] 110 (01/11 0602) Resp:  [16-27] 18 (01/11 0602) BP: (139-146)/(88-97) 139/88 mmHg (01/11 0602) SpO2:  [93 %-98 %] 98 % (01/11 0602) Last BM Date: 01/05/16  Intake/Output from previous day: 01/10 0701 - 01/11 0700 In: 0  Out: 690 [Urine:690] Intake/Output this shift:    General appearance: alert and cooperative GI: soft, ttp in epigastrum  Lab Results:   Recent Labs  01/06/16 0537 01/07/16 0440  WBC 15.0* 15.0*  HGB 14.6 13.9  HCT 44.2 42.1  PLT 268 222   BMET  Recent Labs  01/06/16 0537 01/07/16 0440  NA 138 135  K 3.0* 3.3*  CL 101 97*  CO2 27 28  GLUCOSE 116* 110*  BUN 8 8  CREATININE 1.00 1.02  CALCIUM 7.9* 7.9*   PT/INR No results for input(s): LABPROT, INR in the last 72 hours. ABG No results for input(s): PHART, HCO3 in the last 72 hours.  Invalid input(s): PCO2, PO2  Studies/Results: [place] Abdomen Complete  01/06/2016  CLINICAL DATA:  Acute pancreatitis, fatty infiltrative change of the liver, gallstones, ascites. EXAM: ABDOMEN ULTRASOUND COMPLETE COMPARISON:  Abdominal and pelvic CT scan of January 05, 2016 FINDINGS: Gallbladder: The gallbladder is adequately distended. There are multiple echogenic shadowing stones measuring up to 1.2 cm. There is no gallbladder wall thickening, pericholecystic fluid, or positive sonographic Murphy's sign. Common bile duct: Diameter: 5.3 mm Liver: The liver exhibits increased echotexture diffusely compatible with fatty infiltrative change. There is no focal mass or ductal dilation. IVC: No abnormality visualized. Pancreas: The pancreatic bed is partially obscured by bowel gas. Only a small portion of the pancreatic body is demonstrated. Spleen: Size and appearance within normal limits. Right Kidney: Length: 12 cm. Echogenicity within  normal limits. No mass or hydronephrosis visualized. Left Kidney: Length: 12.4 cm. Echogenicity within normal limits. No mass or hydronephrosis visualized. Abdominal aorta: Visualization of the mid aorta is limited by bowel gas. The proximal and distal aorta are normal in caliber. Other findings: None. IMPRESSION: 1. Limited visualization of the pancreas. 2. Gallstones without sonographic and evidence of acute cholecystitis. Fatty infiltrative change of the liver. 3. No acute abnormality is observed elsewhere within the abdomen. Electronically Signed   By: David  Swazil[place] M.D.   On: 01/06/2016 12:22    Anti-infectives: Anti-infectives    None      Assessment/Plan: Pancreatitis -increased maintence IVF -Mobilize -f/u labs  LOS: 3 days    Drew Prince Jr., [facility] 01/08/2016

## 2016-01-09 DIAGNOSIS — K851 Biliary acute pancreatitis without necrosis or infection: Principal | ICD-10-CM

## 2016-01-09 LAB — PROTIME-INR
INR: 1.11 (ref 0.00–1.49)
Prothrombin Time: 14.5 seconds (ref 11.6–15.2)

## 2016-01-09 LAB — COMPREHENSIVE METABOLIC PANEL
ALBUMIN: 3 g/dL — AB (ref 3.5–5.0)
ALK PHOS: 51 U/L (ref 38–126)
ALT: 17 U/L (ref 17–63)
ANION GAP: 4 — AB (ref 5–15)
AST: 26 U/L (ref 15–41)
BUN: <5 mg/dL — ABNORMAL LOW (ref 6–20)
CALCIUM: 8.7 mg/dL — AB (ref 8.9–10.3)
CO2: 27 mmol/L (ref 22–32)
Chloride: 111 mmol/L (ref 101–111)
Creatinine, Ser: 0.62 mg/dL (ref 0.61–1.24)
GFR calc non Af Amer: >60 mL/min (ref >60)
GLUCOSE: 93 mg/dL (ref 65–99)
POTASSIUM: 3.8 mmol/L (ref 3.5–5.1)
SODIUM: 142 mmol/L (ref 135–145)
Total Bilirubin: 0.4 mg/dL (ref 0.3–1.2)
Total Protein: 5.9 g/dL — ABNORMAL LOW (ref 6.5–8.1)

## 2016-01-09 LAB — CBC
HEMATOCRIT: 26.9 % — AB (ref 39.0–52.0)
HEMATOCRIT: 36.6 % — AB (ref 39.0–52.0)
HEMOGLOBIN: 8.3 g/dL — AB (ref 13.0–17.0)
Hemoglobin: 12.2 g/dL — ABNORMAL LOW (ref 13.0–17.0)
MCH: 24.8 pg — AB (ref 26.0–34.0)
MCH: 26.3 pg (ref 26.0–34.0)
MCHC: 30.9 g/dL (ref 30.0–36.0)
MCHC: 33.3 g/dL (ref 30.0–36.0)
MCV: 80.3 fL (ref 78.0–100.0)
MCV: 79 fL (ref 78.0–100.0)
Platelets: 157 10*3/uL (ref 150–400)
Platelets: 280 10*3/uL (ref 150–400)
RBC: 3.35 MIL/uL — AB (ref 4.22–5.81)
RBC: 4.63 MIL/uL (ref 4.22–5.81)
RDW: 16.7 % — ABNORMAL HIGH (ref 11.5–15.5)
RDW: 13.3 % (ref 11.5–15.5)
WBC: 8.8 10*3/uL (ref 4.0–10.5)
WBC: 10.9 10*3/uL — AB (ref 4.0–10.5)

## 2016-01-09 LAB — TECHNOLOGIST SMEAR REVIEW

## 2016-01-09 LAB — LIPASE, BLOOD: LIPASE: 16 U/L (ref 11–51)

## 2016-01-09 LAB — APTT: APTT: 30 s (ref 24–37)

## 2016-01-09 LAB — LACTATE DEHYDROGENASE: LDH: 404 U/L — ABNORMAL HIGH (ref 98–192)

## 2016-01-09 NOTE — Progress Notes (Signed)
Patient ID: Drew Prince Grable, male   DOB: Feb 24, 1976, 40 y.o.   MRN: [id number]    Subjective: Pt does not feeling well.  Has pain all the time, but still pain with even jello.  Doesn't want to move around because it hurts so bad.  Objective: Vital signs in last 24 hours: Temp:  [98.2 F (36.8 C)-99.5 F (37.5 C)] 99.1 F (37.3 C) (01/12 0550) Pulse Rate:  [107-111] 111 (01/12 0550) Resp:  [12-23] 17 (01/12 0806) BP: (132-136)/(87-96) 135/96 mmHg (01/12 0550) SpO2:  [97 %-100 %] 98 % (01/12 0806) FiO2 (%):  [96 %] 96 % (01/11 1200) Last BM Date: 01/08/16  Intake/Output from previous day: 01/11 0701 - 01/12 0700 In: 0  Out: 400 [Urine:400] Intake/Output this shift:    PE: Abd: distended and tympanitic , some BS, diffusely very tender even to minimal palpation Heart: tachy Lungs: CTAB  Lab Results:   Recent Labs  01/08/16 0730 01/09/16 0532  WBC 11.8* 8.8  HGB 13.2 8.3*  HCT 40.4 26.9*  PLT 259 157   BMET  Recent Labs  01/08/16 0730 01/09/16 0532  NA 134* 142  K 4.1 3.8  CL 96* 111  CO2 28 27  GLUCOSE 91 93  BUN 10 <5*  CREATININE 0.87 0.62  CALCIUM 8.1* 8.7*   PT/INR No results for input(s): LABPROT, INR in the last 72 hours. CMP     Component Value Date/Time   NA 142 01/09/2016 0532   K 3.8 01/09/2016 0532   CL 111 01/09/2016 0532   CO2 27 01/09/2016 0532   GLUCOSE 93 01/09/2016 0532   BUN <5* 01/09/2016 0532   CREATININE 0.62 01/09/2016 0532   CALCIUM 8.7* 01/09/2016 0532   PROT 5.9* 01/09/2016 0532   ALBUMIN 3.0* 01/09/2016 0532   AST 26 01/09/2016 0532   ALT 17 01/09/2016 0532   ALKPHOS 51 01/09/2016 0532   BILITOT 0.4 01/09/2016 0532   GFRNONAA >60 01/09/2016 0532   GFRAA >60 01/09/2016 0532   Lipase     Component Value Date/Time   LIPASE 16 01/09/2016 0532       Studies/Results: No results found.  Anti-infectives: Anti-infectives    None       Assessment/Plan  1. Gallstone pancreatitis -patient has a severe case of  pancreatitis.  Objectively the patient seems to be improving, but clinically he does not seem to be improving at all.  His PCA was increased over night for pain control.  He remains tachycardic.  We highly recommend the patient remain NPO at this point given his clinical exam.  If concerned about nutrition, consider PICC/TNA. -despite objective findings, we will plan to rescan the patient tomorrow to evaluate for worsening pancreatitis/necrosis/formation of pseudocysts. -encouraged the patient to try his best to get OOB and mobilize. -we will continue to follow  2. Anemia -hgb with a big drop today from 13 to 8.  Recheck this today and follow.  LOS: 4 days    Tracy Kinner E 01/09/2016, 9:37 AM Pager: [phone]

## 2016-01-09 NOTE — Care Management Note (Signed)
Case Management Note  Patient Details  Name: Lana FishRiley Daily MRN: [id number] Date of Birth: 1976-08-29  Subjective/Objective:  Patient admitted with acute pancreatitis. Awaiting cholecystectomy once pancreatitis is better. Pt is from home with his spouse.                   Action/Plan: CM will continue to follow for discharge needs.   Expected Discharge Date:                  Expected Discharge Plan:     In-House Referral:     Discharge planning Services     Post Acute Care Choice:    Choice offered to:     DME Arranged:    DME Agency:     HH Arranged:    HH Agency:     Status of Service:  In process, will continue to follow  Medicare Important Message Given:    Date Medicare IM Given:    Medicare IM give by:    Date Additional Medicare IM Given:    Additional Medicare Important Message give by:     If discussed at Long Length of Stay Meetings, dates discussed:    Additional Comments:  Kermit BaloKelli F Makilah Dowda, RN 01/09/2016, 2:34 PM

## 2016-01-09 NOTE — Progress Notes (Signed)
Pt PCA changed. Wasted 2.9893ml with second nurse March RummageKomlanvi, RN. Will continue to monitor.

## 2016-01-09 NOTE — Progress Notes (Signed)
Patient ID: Drew Prince, male   DOB: [birth year], 40 y.o.   MRN: [id number]   Subjective: [name] tells me his pain is the same as yesterday but is better controlled since we changed the setting on the PCA pump. He ate some jello and had diarrhea a few hours later. His abdomen is still distended to the same degree; no nausea or fevers.  Objective: Vital signs in last 24 hours: Filed Vitals:   01/09/16 0400 01/09/16 0537 01/09/16 0550 01/09/16 0806  BP:   135/96   Pulse:   111   Temp:   99.1 F (37.3 C)   TempSrc:   Oral   Resp: 18 18 18 17   SpO2: 99% 97% 100% 98%   Physical exam: General: resting in bed, less diaphoretic today than yesterday Cardiac: regular rate and rhythm, no rubs, murmurs or gallops Pulm: breathing well, clear to auscultation bilaterally Abd: bowel sounds hyperactive, equally distended today compared to yesterday , still diffusely tender throughout, without rebound, guarding, or point tenderness Ext: warm and well perfused, without pedal edema Lymph: no cervical or supraclavicular lymphadenopathy Skin: no rash, hair, or nail changes Neuro: alert and oriented X3, cranial nerves II-XII grossly intact, moving all extremities well  Lab Results: Basic Metabolic Panel:  Recent Labs Lab 01/08/16 0730 01/09/16 0532  NA 134* 142  K 4.1 3.8  CL 96* 111  CO2 28 27  GLUCOSE 91 93  BUN 10 <5*  CREATININE 0.87 0.62  CALCIUM 8.1* 8.7*   Liver Function Tests:  Recent Labs Lab 01/08/16 0730 01/09/16 0532  AST 32 26  ALT 67* 17  ALKPHOS 38 51  BILITOT 1.6* 0.4  PROT 5.6* 5.9*  ALBUMIN 2.6* 3.0*    Recent Labs Lab 01/08/16 0730 01/09/16 0532  LIPASE 57* 16   CBC:  Recent Labs Lab 01/05/16 0615  01/08/16 0730 01/09/16 0532  WBC 16.8*  < > 11.8* 8.8  NEUTROABS 14.3*  --   --   --   HGB 16.7  < > 13.2 8.3*  HCT 50.1  < > 40.4 26.9*  MCV 81.5  < > 80.2 80.3  PLT 347  < > 259 157  < > = values in this interval not displayed.    Medications: I have reviewed the patient's current medications. Scheduled Meds: . enoxaparin (LOVENOX) injection  40 mg Subcutaneous Q24H  . HYDROmorphone   Intravenous 6 times per day   Continuous Infusions: . dextrose 5 % and 0.9% NaCl 125 mL/hr at 01/08/16 2155   PRN Meds:.diphenhydrAMINE **OR** diphenhydrAMINE, naloxone **AND** sodium chloride, ondansetron (ZOFRAN) IV   Assessment/Plan:  [name] is here with acute pancreatitis most likely from transient gallstone passage. He clinically appears slightly improved today and was able to eat some jello yesterday; he remained afebrile overnight and his vitals were stable. His hemoglobin dropped frmo 13 to 8 overnight so we repeated the CBC and will check haptoglobin, LDH, peripheral smear, PT/PTT. He doesn't have any petechiae or thrombocytopenia. His Tbili continued to trend downard which is reassuring there's not an stone actively clogging the ampulla. His IgG4 came back and was normal so I don't think this was autoimmune pancreatitis. He'll get an elective cholecystectomy once he gets through the pancreatitis.  Acute pancreatitis: Per above, likely gallstone pancreatitis. IgG was normal. We'll continue holding the course. -General surgery is following -Continua D5 1/2 NS at 125cc/hr -Continue PCA at current settings -Holding home medications -Ondansetron 4 mg every 6 hours as needed for nausea  Dispo: Disposition is deferred at this time, awaiting improvement of current medical problems.  Anticipated discharge in approximately 2-3 day(s).   The patient does have a current PCP [facility]) and does need an [facility] hospital follow-up appointment after discharge.  The patient does not know have transportation limitations that hinder transportation to clinic appointments.  .Services Needed at time of discharge: Y = Yes, Blank = No PT:   OT:   RN:   Equipment:   Other:     LOS: 4 days   Selina Cooley, MD 01/09/2016, 8:17 AM

## 2016-01-09 NOTE — Progress Notes (Signed)
Received pt on the floor around 1:30; patient in pain, encouraged PCA use; pt upset because he was moved to another unit because of bed space/pt placement.  Will continue to monitor pt

## 2016-01-10 ENCOUNTER — Inpatient Hospital Stay (HOSPITAL_COMMUNITY): Payer: BLUE CROSS/BLUE SHIELD

## 2016-01-10 DIAGNOSIS — K859 Acute pancreatitis without necrosis or infection, unspecified: Secondary | ICD-10-CM

## 2016-01-10 LAB — CBC
HCT: 36.9 % — ABNORMAL LOW (ref 39.0–52.0)
HEMOGLOBIN: 12.4 g/dL — AB (ref 13.0–17.0)
MCH: 27.3 pg (ref 26.0–34.0)
MCHC: 33.6 g/dL (ref 30.0–36.0)
MCV: 81.3 fL (ref 78.0–100.0)
PLATELETS: 305 10*3/uL (ref 150–400)
RBC: 4.54 MIL/uL (ref 4.22–5.81)
RDW: 13.6 % (ref 11.5–15.5)
WBC: 13.9 10*3/uL — AB (ref 4.0–10.5)

## 2016-01-10 LAB — COMPREHENSIVE METABOLIC PANEL
ALK PHOS: 57 U/L (ref 38–126)
ALT: 68 U/L — ABNORMAL HIGH (ref 17–63)
ANION GAP: 8 (ref 5–15)
AST: 43 U/L — ABNORMAL HIGH (ref 15–41)
Albumin: 2.5 g/dL — ABNORMAL LOW (ref 3.5–5.0)
BUN: <5 mg/dL — ABNORMAL LOW (ref 6–20)
CALCIUM: 8.2 mg/dL — AB (ref 8.9–10.3)
CO2: 32 mmol/L (ref 22–32)
CREATININE: 0.76 mg/dL (ref 0.61–1.24)
Chloride: 97 mmol/L — ABNORMAL LOW (ref 101–111)
Glucose, Bld: 114 mg/dL — ABNORMAL HIGH (ref 65–99)
Potassium: 3.9 mmol/L (ref 3.5–5.1)
SODIUM: 137 mmol/L (ref 135–145)
TOTAL PROTEIN: 5.9 g/dL — AB (ref 6.5–8.1)
Total Bilirubin: 1.4 mg/dL — ABNORMAL HIGH (ref 0.3–1.2)

## 2016-01-10 LAB — HAPTOGLOBIN: HAPTOGLOBIN: 159 mg/dL (ref 34–200)

## 2016-01-10 MED ORDER — IOHEXOL 300 MG/ML  SOLN
100.0000 mL | Freq: Once | INTRAMUSCULAR | Status: AC | PRN
  Administered 2016-01-10: 100 mL via INTRAVENOUS

## 2016-01-10 MED ORDER — SODIUM CHLORIDE 0.9 % IJ SOLN
10.0000 mL | INTRAMUSCULAR | Status: DC | PRN
  Administered 2016-01-14 – 2016-01-30 (×11): 10 mL
  Administered 2016-02-01: 20 mL
  Filled 2016-01-10 (×12): qty 40

## 2016-01-10 MED ORDER — SODIUM CHLORIDE 0.9 % IV SOLN
500.0000 mg | Freq: Four times a day (QID) | INTRAVENOUS | Status: DC
  Administered 2016-01-10 – 2016-01-17 (×27): 500 mg via INTRAVENOUS
  Filled 2016-01-10 (×31): qty 500

## 2016-01-10 MED ORDER — TRACE MINERALS CR-CU-MN-SE-ZN 10-1000-500-60 MCG/ML IV SOLN
INTRAVENOUS | Status: AC
  Administered 2016-01-10: 18:00:00 via INTRAVENOUS
  Filled 2016-01-10: qty 960

## 2016-01-10 MED ORDER — FAT EMULSION 20 % IV EMUL
240.0000 mL | INTRAVENOUS | Status: AC
  Administered 2016-01-10: 240 mL via INTRAVENOUS
  Filled 2016-01-10: qty 250

## 2016-01-10 MED ORDER — DEXTROSE-NACL 5-0.9 % IV SOLN
INTRAVENOUS | Status: DC
  Administered 2016-01-11: 06:00:00 via INTRAVENOUS

## 2016-01-10 MED ORDER — INSULIN ASPART 100 UNIT/ML ~~LOC~~ SOLN
0.0000 [IU] | Freq: Four times a day (QID) | SUBCUTANEOUS | Status: DC
  Administered 2016-01-12: 1 [IU] via SUBCUTANEOUS

## 2016-01-10 NOTE — Progress Notes (Signed)
Peripherally Inserted Central Catheter/Midline Placement  The IV Nurse has discussed with the patient and/or persons authorized to consent for the patient, the purpose of this procedure and the potential benefits and risks involved with this procedure.  The benefits include less needle sticks, lab draws from the catheter and patient may be discharged home with the catheter.  Risks include, but not limited to, infection, bleeding, blood clot (thrombus formation), and puncture of an artery; nerve damage and irregular heat beat.  Alternatives to this procedure were also discussed.  Consent obtained by Reginia FortsMarilyn Lumban, RN.  PICC/Midline Placement Documentation        Drew Prince, Drew ManesKerry Prince 01/10/2016, 1:32 PM

## 2016-01-10 NOTE — Progress Notes (Signed)
PARENTERAL NUTRITION CONSULT NOTE - INITIAL  Pharmacy Consult for TPN Indication: severe pancreatitis and ileus  Allergies  Allergen Reactions  . Penicillins Rash    Patient Measurements: Height: 5\' 4"  (162.6 cm) Weight: 186 lb 11.7 oz (84.7 kg) IBW/kg (Calculated) : 59.2    Vital Signs: Temp: 100.3 F (37.9 C) (01/13 0848) Temp Source: Oral (01/13 0848) BP: 144/86 mmHg (01/13 0848) Pulse Rate: 11 (01/13 0848) Intake/Output from previous day: 01/12 0701 - 01/13 0700 In: -  Out: 1000 [Urine:1000] Intake/Output from this shift: Total I/O In: -  Out: 500 [Urine:500]  Labs:  Recent Labs  01/09/16 0532 01/09/16 0907 01/09/16 1054 01/10/16 0421  WBC 8.8 10.9*  --  13.9*  HGB 8.3* 12.2*  --  12.4*  HCT 26.9* 36.6*  --  36.9*  PLT 157 280  --  305  APTT  --   --  30  --   INR  --   --  1.11  --      Recent Labs  01/08/16 0730 01/09/16 0532 01/10/16 0421  NA 134* 142 137  K 4.1 3.8 3.9  CL 96* 111 97*  CO2 28 27 32  GLUCOSE 91 93 114*  BUN 10 <5* <5*  CREATININE 0.87 0.62 0.76  CALCIUM 8.1* 8.7* 8.2*  PROT 5.6* 5.9* 5.9*  ALBUMIN 2.6* 3.0* 2.5*  AST 32 26 43*  ALT 67* 17 68*  ALKPHOS 38 51 57  BILITOT 1.6* 0.4 1.4*   Estimated Creatinine Clearance: 120.5 mL/min (by C-G formula based on Cr of 0.76).   No results for input(s): GLUCAP in the last 72 hours.  Medical History: Past Medical History  Diagnosis Date  . Hypertension   . Hypercholesteremia     Medications:  Prescriptions prior to admission  Medication Sig Dispense Refill Last Dose  . atorvastatin (LIPITOR) 10 MG tablet Take 10 mg by mouth daily.   01/04/2016 at Unknown time  . buPROPion (WELLBUTRIN XL) 150 MG 24 hr tablet Take 150 mg by mouth daily.   01/04/2016 at Unknown time  . fenofibrate (TRICOR) 145 MG tablet Take 160 mg by mouth daily.    01/04/2016 at Unknown time  . lisinopril (PRINIVIL,ZESTRIL) 5 MG tablet Take 5 mg by mouth daily.   01/04/2016 at Unknown time    Insulin  Requirements in the past 24 hours:  none  Current Nutrition:  NPO x ice chips  Assessment: 40 yo M with gallstone pancreatitis and ileus.  Pharmacy consulted to dose TPN for nutrition support.  GI: gallstone pancreatitis, for cholecystectomy once pancreatitis is better; ileus. For CT to eval pancreas today.   Nutritional Goals: will f/u w/ RD  Renal: creat WNL, lytes WNL, D5ns at 150 ml/hr  Endo: no hx DM, serum 114  Access: PICC ordered for TPN 1/13  TPN: 1/13>>  Plan:  -start TPN with Clinimix E 5/15 at 40 ml/hr plus IVFE at 10 ml/hr and assess tolerance.  This will provide 48 gm protein and 1162 kcals.  - once TPN starts, will decrease IVF to 100 ml/hr to keep total IVF at 150 ml/hr - add MVI/TE to TPN - f/u w unit RD for goals - empiric SSI, will stop once TPN at goal if not needed - TPN labs  Herby AbrahamMichelle T. Blue Ruggerio, Pharm.D. [phone] 01/10/2016 12:11 PM

## 2016-01-10 NOTE — Progress Notes (Signed)
Patient ID: Drew Prince, male   DOB: 1976/01/10, 40 y.o.   MRN: [id number]   Subjective: Drew Prince is still feeling the same as yesterday. His abdomen is getting slightly more distended. He is having trouble urinating. We told him we was place a PICC line to give him TPN, and check another abdominal CT today.  Objective: Vital signs in last 24 hours: Filed Vitals:   01/10/16 0800 01/10/16 0848 01/10/16 0923 01/10/16 1141  BP:  144/86    Pulse:  11    Temp:  100.3 F (37.9 C)    TempSrc:  Oral    Resp: 12 16  15   Height:   5\' 4"  (1.626 m)   Weight:   84.7 kg (186 lb 11.7 oz)   SpO2: 97% 98%  95%   Physical exam: General: resting in bed, still diaphoretic Abd: a little more distended today compared to yesterday Ext: warm and well perfused, without pedal edema  Lab Results: Basic Metabolic Panel:  Recent Labs Lab 01/09/16 0532 01/10/16 0421  NA 142 137  K 3.8 3.9  CL 111 97*  CO2 27 32  GLUCOSE 93 114*  BUN <5* <5*  CREATININE 0.62 0.76  CALCIUM 8.7* 8.2*   Liver Function Tests:  Recent Labs Lab 01/09/16 0532 01/10/16 0421  AST 26 43*  ALT 17 68*  ALKPHOS 51 57  BILITOT 0.4 1.4*  PROT 5.9* 5.9*  ALBUMIN 3.0* 2.5*   CBC:  Recent Labs Lab 01/05/16 0615  01/09/16 0907 01/10/16 0421  WBC 16.8*  < > 10.9* 13.9*  NEUTROABS 14.3*  --   --   --   HGB 16.7  < > 12.2* 12.4*  HCT 50.1  < > 36.6* 36.9*  MCV 81.5  < > 79.0 81.3  PLT 347  < > 280 305  < > = values in this interval not displayed.  Medications: I have reviewed the patient's current medications. Scheduled Meds: . enoxaparin (LOVENOX) injection  40 mg Subcutaneous Q24H  . HYDROmorphone   Intravenous 6 times per day  . [START ON 01/11/2016] insulin aspart  0-9 Units Subcutaneous 4 times per day   Continuous Infusions: . dextrose 5 % and 0.9% NaCl 150 mL/hr at 01/10/16 1057  . dextrose 5 % and 0.9% NaCl    . Marland KitchenTPN (CLINIMIX-E) Adult     And  . fat emulsion     PRN Meds:.diphenhydrAMINE  **OR** diphenhydrAMINE, naloxone **AND** sodium chloride, ondansetron (ZOFRAN) IV   Assessment/Plan:  Acute pancreatitis thought to be from transient gallstone: He is very slow to improve after 5 days and I think he is now developing an ileus. His white count slowly trending upward, fever curve slowly trending upward, AST/ALT and 2 bili up a little bit today. He'll get a repeat abdominal CT today that I suspect will either show necrosis or pseudocyst development. Given he is now 5 days without enteral nutrition so we will place a PICC line today and start TPN. If he becomes febrile or hypotensive, we will need to seriously consider starting meropenem. -Gen. surgery is following -Abdominal CT today -PICC line today, and start TPN -Continua D5 1/2 NS at 125cc/hr -Continue PCA at current settings -Holding home medications -Ondansetron 4 mg every 6 hours as needed for nausea  Dispo: Disposition is deferred at this time, awaiting improvement of current medical problems.  Anticipated discharge in approximately 3-5 day(s).   The patient does have a current PCP [facility]) and [facility]  [facility] after discharge.  The patient does not know have transportation limitations that hinder transportation to clinic appointments.  .Services Needed at time of discharge: Y = Yes, Blank = No PT:   OT:   RN:   Equipment:   Other:     LOS: 5 days   Selina CooleyKyle Adisen Bennion, MD 01/10/2016, 12:36 PM

## 2016-01-10 NOTE — Progress Notes (Signed)
Pt ambulates assist x1 to the bathroom without assistive device. No noted distress. Pt  sat up on the side of the bed to wash up. Wife at bedside.

## 2016-01-10 NOTE — Progress Notes (Signed)
  Subjective: abd distention con't abd pain  Objective: Vital signs in last 24 hours: Temp:  [98.5 F (36.9 C)-100.3 F (37.9 C)] 100.3 F (37.9 C) (01/13 0848) Pulse Rate:  [11-118] 11 (01/13 0848) Resp:  [12-28] 16 (01/13 0848) BP: (135-144)/(83-95) 144/86 mmHg (01/13 0848) SpO2:  [97 %-100 %] 98 % (01/13 0848) Weight:  [84.7 kg (186 lb 11.7 oz)] 84.7 kg (186 lb 11.7 oz) (01/13 0923) Last BM Date: 01/08/16  Intake/Output from previous day: 01/12 0701 - 01/13 0700 In: -  Out: 1000 [Urine:1000] Intake/Output this shift: Total I/O In: -  Out: 500 [Urine:500]  General appearance: alert and cooperative GI: gen ttp, no peritoneal signs  Lab Results:   Recent Labs  01/09/16 0907 01/10/16 0421  WBC 10.9* 13.9*  HGB 12.2* 12.4*  HCT 36.6* 36.9*  PLT 280 305   BMET  Recent Labs  01/09/16 0532 01/10/16 0421  NA 142 137  K 3.8 3.9  CL 111 97*  CO2 27 32  GLUCOSE 93 114*  BUN <5* <5*  CREATININE 0.62 0.76  CALCIUM 8.7* 8.2*   PT/INR  Recent Labs  01/09/16 1054  LABPROT 14.5  INR 1.11    Anti-infectives: Anti-infectives    None      Assessment/Plan: 1. Gallstone pancreatitis -CT scan to eval pancreas today -Rec TNA, Pt with ileus -Increase IVF -Strict I&Os   LOS: 5 days    Marigene Ehlersamirez Jr., [facility] 01/10/2016

## 2016-01-10 NOTE — Progress Notes (Signed)
Initial Nutrition Assessment  DOCUMENTATION CODES:   Obesity unspecified  INTERVENTION:   -TPN management per pharmacy  NUTRITION DIAGNOSIS:   Inadequate oral intake related to altered GI function as evidenced by NPO status.  GOAL:   Patient will meet greater than or equal to 90% of their needs  MONITOR:   Diet advancement, Labs, Weight trends, Skin, I & O's  REASON FOR ASSESSMENT:   Consult New TPN/TNA  ASSESSMENT:   40 year old Asian man presented to the emergency department and was admitted with a diagnosis of acute pancreatitis. He has had diffuse abdominal pain with associated nausea and vomiting for about 1 day. Otherwise has been in his usual state of health. Negative alcohol history. No new prescription or nonprescription medications.   Pt admitted with acute pancreatitis.   Pt receiving nursing care at time of visit.   Pt has been unable to tolerate liquids during hospitalization secondary to abdominal pain. Per chart review, pt with good appetite and no weight loss PTA. Per MD notes, abdomen has been more distended with concern for partial ileus (repeat CT scan reveals increase of peripancreatic fluid). Pt underwent PICC placement earlier today to initiate TPN, as MD is suspecting prolonged NPO status.   Per pharmacy note, plan to initiate TPN with Clinimix E 5/15 at 40 ml/hr plus IVFE at 10 ml/hr at 1800. This will provide 48 gm protein and 1162 kcals.   Nutrition-focused physical exam deferred secondary to pain.   Labs reviewed.   Diet Order:  Diet NPO time specified Except for: Ice Chips TPN (CLINIMIX-E) Adult  Skin:  Reviewed, no issues  Last BM:  01/08/16  Height:   Ht Readings from Last 1 Encounters:  01/10/16 5\' 4"  (1.626 m)    Weight:   Wt Readings from Last 1 Encounters:  01/10/16 186 lb 11.7 oz (84.7 kg)    Ideal Body Weight:  59.1 kg  BMI:  Body mass index is 32.04 kg/(m^2).  Estimated Nutritional Needs:   Kcal:   1900-2100  Protein:  95-110 grams  Fluid:  1.9-2.1 L  EDUCATION NEEDS:   No education needs identified at this time  Kaylin Marcon A. Mayford KnifeWilliams, RD, LDN, CDE Pager: [phone] After hours Pager: [phone]

## 2016-01-10 NOTE — Progress Notes (Signed)
Pt Dilaudid PCA 1.3223ml wasted with second RN, Komlanvi.

## 2016-01-11 DIAGNOSIS — R509 Fever, unspecified: Secondary | ICD-10-CM

## 2016-01-11 DIAGNOSIS — K8591 Acute pancreatitis with uninfected necrosis, unspecified: Secondary | ICD-10-CM

## 2016-01-11 LAB — GLUCOSE, CAPILLARY
GLUCOSE-CAPILLARY: 84 mg/dL (ref 65–99)
Glucose-Capillary: 117 mg/dL — ABNORMAL HIGH (ref 65–99)
Glucose-Capillary: 120 mg/dL — ABNORMAL HIGH (ref 65–99)
Glucose-Capillary: 101 mg/dL — ABNORMAL HIGH (ref 65–99)

## 2016-01-11 LAB — CBC
HEMATOCRIT: 35.2 % — AB (ref 39.0–52.0)
Hemoglobin: 11.8 g/dL — ABNORMAL LOW (ref 13.0–17.0)
MCH: 27.1 pg (ref 26.0–34.0)
MCHC: 33.5 g/dL (ref 30.0–36.0)
MCV: 80.7 fL (ref 78.0–100.0)
Platelets: 344 10*3/uL (ref 150–400)
RBC: 4.36 MIL/uL (ref 4.22–5.81)
RDW: 13.5 % (ref 11.5–15.5)
WBC: 13.4 10*3/uL — ABNORMAL HIGH (ref 4.0–10.5)

## 2016-01-11 LAB — COMPREHENSIVE METABOLIC PANEL
ALBUMIN: 2.4 g/dL — AB (ref 3.5–5.0)
ALK PHOS: 96 U/L (ref 38–126)
ALT: 97 U/L — AB (ref 17–63)
AST: 78 U/L — AB (ref 15–41)
Anion gap: 8 (ref 5–15)
BILIRUBIN TOTAL: 0.8 mg/dL (ref 0.3–1.2)
CALCIUM: 8.1 mg/dL — AB (ref 8.9–10.3)
CO2: 28 mmol/L (ref 22–32)
Chloride: 99 mmol/L — ABNORMAL LOW (ref 101–111)
Creatinine, Ser: 0.58 mg/dL — ABNORMAL LOW (ref 0.61–1.24)
GFR calc Af Amer: >60 mL/min (ref >60)
GFR calc non Af Amer: >60 mL/min (ref >60)
GLUCOSE: 194 mg/dL — AB (ref 65–99)
Potassium: 3.8 mmol/L (ref 3.5–5.1)
Sodium: 135 mmol/L (ref 135–145)
TOTAL PROTEIN: 6 g/dL — AB (ref 6.5–8.1)

## 2016-01-11 LAB — TRIGLYCERIDES: Triglycerides: 236 mg/dL — ABNORMAL HIGH (ref <150)

## 2016-01-11 LAB — PREALBUMIN: Prealbumin: 7.6 mg/dL — ABNORMAL LOW (ref 18–38)

## 2016-01-11 LAB — MAGNESIUM: MAGNESIUM: 2 mg/dL (ref 1.7–2.4)

## 2016-01-11 LAB — PHOSPHORUS: PHOSPHORUS: 2.2 mg/dL — AB (ref 2.5–4.6)

## 2016-01-11 MED ORDER — SODIUM CHLORIDE 0.9 % IV SOLN
15.0000 mmol | Freq: Once | INTRAVENOUS | Status: AC
  Administered 2016-01-11: 15 mmol via INTRAVENOUS
  Filled 2016-01-11 (×2): qty 5

## 2016-01-11 MED ORDER — TRACE MINERALS CR-CU-MN-SE-ZN 10-1000-500-60 MCG/ML IV SOLN
INTRAVENOUS | Status: AC
  Administered 2016-01-11: 18:00:00 via INTRAVENOUS
  Filled 2016-01-11: qty 2000

## 2016-01-11 MED ORDER — HYDROMORPHONE 1 MG/ML IV SOLN
INTRAVENOUS | Status: DC
  Administered 2016-01-11: 5.5 mg via INTRAVENOUS
  Administered 2016-01-11: 4 mg via INTRAVENOUS
  Administered 2016-01-12: 13:00:00 via INTRAVENOUS
  Administered 2016-01-12: 4 mg via INTRAVENOUS
  Administered 2016-01-12: 6 mg via INTRAVENOUS
  Administered 2016-01-12: 7 mg via INTRAVENOUS
  Administered 2016-01-13: 8 mg via INTRAVENOUS
  Filled 2016-01-11 (×3): qty 25

## 2016-01-11 MED ORDER — FAT EMULSION 20 % IV EMUL
240.0000 mL | INTRAVENOUS | Status: AC
  Administered 2016-01-11: 240 mL via INTRAVENOUS
  Filled 2016-01-11: qty 250

## 2016-01-11 MED ORDER — DEXTROSE-NACL 5-0.9 % IV SOLN
INTRAVENOUS | Status: DC
  Administered 2016-01-13: 02:00:00 via INTRAVENOUS
  Administered 2016-01-16: 500 mL via INTRAVENOUS
  Administered 2016-01-17: 22:00:00 via INTRAVENOUS
  Administered 2016-01-17: 1000 mL via INTRAVENOUS
  Administered 2016-01-20 – 2016-01-25 (×6): via INTRAVENOUS

## 2016-01-11 NOTE — Progress Notes (Signed)
ANTIBIOTIC CONSULT NOTE - INITIAL  Pharmacy Consult for Imipenem Indication: Intra-Abdominal Infection   Allergies  Allergen Reactions  . Penicillins Rash    Patient Measurements: Height: 5\' 4"  (162.6 cm) Weight: 186 lb 11.7 oz (84.7 kg) IBW/kg (Calculated) : 59.2   Vital Signs: Temp: 99 F (37.2 C) (01/14 0544) Temp Source: Oral (01/14 0544) BP: 154/84 mmHg (01/14 0544) Pulse Rate: 109 (01/14 0544) Intake/Output from previous day: 01/13 0701 - 01/14 0700 In: -  Out: 975 [Urine:975] Intake/Output from this shift:    Labs:  Recent Labs  01/09/16 0532 01/09/16 0907 01/10/16 0421 01/11/16 0505  WBC 8.8 10.9* 13.9* 13.4*  HGB 8.3* 12.2* 12.4* 11.8*  PLT 157 280 305 344  CREATININE 0.62  --  0.76 0.58*   Estimated Creatinine Clearance: 120.5 mL/min (by C-G formula based on Cr of 0.58). No results for input(s): VANCOTROUGH, VANCOPEAK, VANCORANDOM, GENTTROUGH, GENTPEAK, GENTRANDOM, TOBRATROUGH, TOBRAPEAK, TOBRARND, AMIKACINPEAK, AMIKACINTROU, AMIKACIN in the last 72 hours.   Microbiology: No results found for this or any previous visit (from the past 720 hour(s)).  Medical History: Past Medical History  Diagnosis Date  . Hypertension   . Hypercholesteremia     Medications:  Scheduled:  . enoxaparin (LOVENOX) injection  40 mg Subcutaneous Q24H  . HYDROmorphone   Intravenous 6 times per day  . imipenem-cilastatin  500 mg Intravenous Q6H  . insulin aspart  0-9 Units Subcutaneous 4 times per day  . potassium phosphate IVPB (mmol)  15 mmol Intravenous Once   Infusions:  . dextrose 5 % and 0.9% NaCl 100 mL/hr at 01/11/16 0557  . dextrose 5 % and 0.9% NaCl    . Marland Kitchen.TPN (CLINIMIX-E) Adult 40 mL/hr at 01/10/16 1742   And  . fat emulsion 240 mL (01/10/16 1742)  . Marland Kitchen.TPN (CLINIMIX-E) Adult     And  . fat emulsion     PRN: diphenhydrAMINE **OR** diphenhydrAMINE, naloxone **AND** sodium chloride, ondansetron (ZOFRAN) IV, sodium chloride Assessment: 40 yo M with  gallstone pancreatitis and ileus. Pharmacy consulted to dose imipenem for intra-abdominal infection.   WBC 13.4, Tmax 99.4.  CrCl = 120 ml/min  Imipenem 1/13 >>   Plan:  Continue Imipenem 500 mg IV q6hr F/U s/sx of worsening infection   Drew Prince E Drew Prince 01/11/2016,8:21 AM

## 2016-01-11 NOTE — Progress Notes (Signed)
PARENTERAL NUTRITION CONSULT NOTE   Pharmacy Consult for TPN Indication: severe pancreatitis and ileus  Allergies  Allergen Reactions  . Penicillins Rash    Patient Measurements: Height: 5\' 4"  (162.6 cm) Weight: 186 lb 11.7 oz (84.7 kg) IBW/kg (Calculated) : 59.2    Vital Signs: Temp: 99 F (37.2 C) (01/14 0544) Temp Source: Oral (01/14 0544) BP: 154/84 mmHg (01/14 0544) Pulse Rate: 109 (01/14 0544) Intake/Output from previous day: 01/13 0701 - 01/14 0700 In: -  Out: 975 [Urine:975] Intake/Output from this shift:    Labs:  Recent Labs  01/09/16 0907 01/09/16 1054 01/10/16 0421 01/11/16 0505  WBC 10.9*  --  13.9* 13.4*  HGB 12.2*  --  12.4* 11.8*  HCT 36.6*  --  36.9* 35.2*  PLT 280  --  305 344  APTT  --  30  --   --   INR  --  1.11  --   --      Recent Labs  01/09/16 0532 01/10/16 0421 01/11/16 0505  NA 142 137 135  K 3.8 3.9 3.8  CL 111 97* 99*  CO2 27 32 28  GLUCOSE 93 114* 194*  BUN <5* <5* <5*  CREATININE 0.62 0.76 0.58*  CALCIUM 8.7* 8.2* 8.1*  MG  --   --  2.0  PHOS  --   --  2.2*  PROT 5.9* 5.9* 6.0*  ALBUMIN 3.0* 2.5* 2.4*  AST 26 43* 78*  ALT 17 68* 97*  ALKPHOS 51 57 96  BILITOT 0.4 1.4* 0.8  PREALBUMIN  --   --  7.6*  TRIG  --   --  236*   Estimated Creatinine Clearance: 120.5 mL/min (by C-G formula based on Cr of 0.58).    Recent Labs  01/11/16 0131 01/11/16 0648  GLUCAP 117* 120*    Insulin Requirements in the past 24 hours:  none  Current Nutrition:  NPO x ice chips TPN 40 + IVFE 10 D5NS 100   Assessment: 40 yo M with gallstone pancreatitis and ileus.  Pharmacy consulted to dose TPN for nutrition support.  GI: gallstone pancreatitis, for cholecystectomy once pancreatitis is better; ileus.  1/13 CT abd:  Significant increase in peripancreatic & RP fluid - c/w infarction or early necrosis  Nutritional Goals: per RD 1/13 Kcal: 1900-2100 Protein: 95-110 grams Fluid: 1.9-2.1 L  1/14 prealbumin 7.6 - low  due to inflammatory process of pancreatitis  Renal: creat WNL, K 3.8, phos sl low at 2.2 mag 2  D5ns at 100 ml/hr  Endo: no hx DM, CBGs 117 and 120 w/ new TPN at 40 ml/hr, serum gluc elevated at 194  Hepatic: PMH HLD, on fenofibrate and atorvastatin PTA, trig 236 1/14  ID:  WBC 13/4, Tmax 99.4 Imipenem 1/13>>  Access: PICC placed for TPN 1/13  TPN: 1/13>>  Plan:  -increase Clinimix E 5/15 to 83 ml/hr plus IVFE at 10 ml/hr.  This will provide 100 gm protein and 1900 kcals to meet 100% of goals.  - decrease IVF to 60 ml/hr to keep total IVF ~ 150 ml/hr - K phos 15 mM (22 meq K) - continue MVI/TE in TPN - continue empiric SSI, will stop once TPN at goal if not needed  Herby AbrahamMichelle T. Tanya Crothers, Pharm.D. [phone] 01/11/2016 7:43 AM

## 2016-01-11 NOTE — Progress Notes (Signed)
Patient ID: Drew Prince, male   DOB: 1976-11-16, 40 y.o.   MRN: [id number]  [facility]  HD#: 7  Subjective: Patient in bed, family at bedside.  Complains of anterior abd pain, left > right.  On TNA.  Objective: Vital signs in last 24 hours: Temp:  [98.6 F (37 C)-99.7 F (37.6 C)] 99 F (37.2 C) (01/14 0544) Pulse Rate:  [108-117] 109 (01/14 0544) Resp:  [11-20] 16 (01/14 0544) BP: (142-154)/(84-94) 154/84 mmHg (01/14 0544) SpO2:  [95 %-100 %] 99 % (01/14 0544) FiO2 (%):  [99 %] 99 % (01/13 1644) Weight:  [84.7 kg (186 lb 11.7 oz)] 84.7 kg (186 lb 11.7 oz) (01/13 0923) Last BM Date: 01/08/16  Intake/Output from previous day: 01/13 0701 - 01/14 0700 In: -  Out: 975 [Urine:975] Intake/Output this shift:    Physical Exam: HEENT - sclerae clear, mucous membranes moist Neck - soft Chest - clear bilaterally Cor - RRR Abdomen - moderate distension; few BS present; moderate tenderness left > right with guarding Ext - no edema, non-tender Neuro - alert & oriented, no focal deficits  Lab Results:   Recent Labs  01/10/16 0421 01/11/16 0505  WBC 13.9* 13.4*  HGB 12.4* 11.8*  HCT 36.9* 35.2*  PLT 305 344   BMET  Recent Labs  01/10/16 0421 01/11/16 0505  NA 137 135  K 3.9 3.8  CL 97* 99*  CO2 32 28  GLUCOSE 114* 194*  BUN <5* <5*  CREATININE 0.76 0.58*  CALCIUM 8.2* 8.1*   PT/INR  Recent Labs  01/09/16 1054  LABPROT 14.5  INR 1.11   Comprehensive Metabolic Panel:    Component Value Date/Time   NA 135 01/11/2016 0505   NA 137 01/10/2016 0421   K 3.8 01/11/2016 0505   K 3.9 01/10/2016 0421   CL 99* 01/11/2016 0505   CL 97* 01/10/2016 0421   CO2 28 01/11/2016 0505   CO2 32 01/10/2016 0421   BUN <5* 01/11/2016 0505   BUN <5* 01/10/2016 0421   CREATININE 0.58* 01/11/2016 0505   CREATININE 0.76 01/10/2016 0421   GLUCOSE 194* 01/11/2016 0505   GLUCOSE 114* 01/10/2016 0421   CALCIUM 8.1* 01/11/2016 0505   CALCIUM 8.2* 01/10/2016 0421   AST 78* 01/11/2016 0505   AST 43* 01/10/2016 0421   ALT 97* 01/11/2016 0505   ALT 68* 01/10/2016 0421   ALKPHOS 96 01/11/2016 0505   ALKPHOS 57 01/10/2016 0421   BILITOT 0.8 01/11/2016 0505   BILITOT 1.4* 01/10/2016 0421   PROT 6.0* 01/11/2016 0505   PROT 5.9* 01/10/2016 0421   ALBUMIN 2.4* 01/11/2016 0505   ALBUMIN 2.5* 01/10/2016 0421    Studies/Results: Ct Abdomen Pelvis W Contrast  01/10/2016  CLINICAL DATA:  Pancreatitis follow-up. History of hypertension and hypercholesterolemia. EXAM: CT ABDOMEN AND PELVIS WITH CONTRAST TECHNIQUE: Multidetector CT imaging of the abdomen and pelvis was performed using the standard protocol following bolus administration of intravenous contrast. CONTRAST:  100mL OMNIPAQUE IOHEXOL 300 MG/ML  SOLN COMPARISON:  01/05/2016 FINDINGS: Lower chest: Small new bilateral pleural effusions are present. There is new left basilar atelectasis or early consolidation. Upper abdomen: The liver is diffusely low in attenuation and consistent with hepatic steatosis. No focal liver lesions are identified. No focal abnormality identified within the spleen or left kidney. Small right renal cyst is identified. There is significant peripancreatic and retroperitoneal fluid, significantly increased since the prior study. There is a 2.0 x 2.9 cm area of the distal  pancreatic body showing poor enhancement on today's exam, consistent with infarction or early necrosis. A similar area of poor enhancement is identified along the ventral aspect of the mid pancreas measuring 1.3 x 3.6 cm. There is significant fluid within the lesser sac. However no discrete fluid collections are identified to indicate pseudocyst or abscess. Gallstones are better seen on the prior study. Gastrointestinal tract: Partial malrotation of the small bowel. Jejunal loops distended in the right abdomen. Appendix is normal in appearance. Colonic loops contain residual contrast following  recent CT exam and are otherwise unremarkable. Pelvis: Urinary bladder is distended. The prostate gland and seminal vesicles have a normal appearance. Small amount of free pelvic fluid noted. Retroperitoneum: No evidence for aortic aneurysm. Small retroperitoneal lymph nodes are nonspecific and likely reactive. Abdominal wall: Mild body wall edema. Osseous structures: Unremarkable. IMPRESSION: 1. Interval progression of peripancreatic and retroperitoneal exam dates. 2. New areas of pancreatic ischemia or necrosis. 3. No pseudocyst or abscess identified. 4. New small bilateral pleural effusions and bibasilar atelectasis or early consolidation. 5. Hepatic steatosis. 6. Partial malrotation of the small bowel incidentally noted. No bowel obstruction. 7. Mild body wall edema. The salient findings were discussed with Dr. Derrell Lolling on 01/10/2016 at 1:32 pm. Electronically Signed   By: Norva Pavlov M.D.   On: 01/10/2016 13:32   Dg Chest Port 1 View  01/10/2016  CLINICAL DATA:  Line placement. EXAM: PORTABLE CHEST 1 VIEW COMPARISON:  None. FINDINGS: Right PICC line noted with tip at cavoatrial junction. Low lung volumes with mild bibasilar atelectasis. Small left pleural effusion. No pneumothorax. IMPRESSION: 1. Right PICC line noted with tip in cavoatrial junction. 2. Low lung volumes with mild bibasilar atelectasis and left pleural effusion. Electronically Signed   By: Maisie Fus  Register   On: 01/10/2016 14:42    Anti-infectives: Anti-infectives    Start     Dose/Rate Route Frequency Ordered Stop   01/10/16 1600  imipenem-cilastatin (PRIMAXIN) 500 mg in sodium chloride 0.9 % 100 mL IVPB     500 mg 200 mL/hr over 30 Minutes Intravenous Every 6 hours 01/10/16 1439        Assessment & Plans: Biliary pancreatitis with necrosis  Continue NPO, IVF, TNA  Encourage OOB, IS use  Will continue to follow with you  No role for operative intervention acutely  Cholecystectomy when inflammation subsides - discussed  with patient and family  Velora Heckler, MD, [facility] Office: [phone]   Rexton Greulich Judie Petit 01/11/2016

## 2016-01-11 NOTE — Evaluation (Signed)
Physical Therapy Evaluation Patient Details Name: Drew Prince Gracie MRN: [id number] DOB: 1976/03/31 Today's Date: 01/11/2016   History of Present Illness  Pt is a 40 y/o male who presents with acute moderate pancreatitis.   Clinical Impression  Pt admitted with above diagnosis. Pt currently with functional limitations due to the deficits listed below (see PT Problem List). At the time of PT eval pt was able to perform transfers and ambulation with supervision for safety. Pain was a limiting factor during session and had difficulty getting comfortable OOB. Encouraged up in chair as much as he could tolerate with change in position for comfort. Will continue to see pt acutely to establish an exercise program and minimize functional decline while in the hospital. Pt will benefit from skilled PT to increase their independence and safety with mobility to allow discharge to the venue listed below.      Follow Up Recommendations Outpatient PT;Supervision for mobility/OOB    Equipment Recommendations  3in1 (PT)    Recommendations for Other Services       Precautions / Restrictions Precautions Precautions: Fall Restrictions Weight Bearing Restrictions: No      Mobility  Bed Mobility Overal bed mobility: Modified Independent             General bed mobility comments: Increased time required due to pain. Pt was instructed in log roll for abdominal comfort.  Transfers Overall transfer level: Needs assistance Equipment used: None Transfers: Sit to/from Stand Sit to Stand: Supervision         General transfer comment: Supervision for safety. Pt reaching out for support but did not appear off balance. No assist required.   Ambulation/Gait Ambulation/Gait assistance: Supervision Ambulation Distance (Feet): 15 Feet Assistive device: None Gait Pattern/deviations: Step-through pattern;Decreased stride length;Trunk flexed Gait velocity: Decreased Gait velocity interpretation: Below  normal speed for age/gender General Gait Details: Pt with guarded ambulation around room. Focus was need for bathroom use. Pt reaching out for furniture as he walked and states "just in case". Pt does not appear off balance during ambulation.   Stairs            Wheelchair Mobility    Modified Rankin (Stroke Patients Only)       Balance Overall balance assessment: No apparent balance deficits (not formally assessed)                                           Pertinent Vitals/Pain Pain Assessment: Faces Faces Pain Scale: Hurts even more Pain Location: Anterior abdomen Pain Descriptors / Indicators: Discomfort;Grimacing Pain Intervention(s): Limited activity within patient's tolerance;Monitored during session;Repositioned    Home Living Family/patient expects to be discharged to:: Private residence Living Arrangements: Spouse/significant other Available Help at Discharge: Family;Available PRN/intermittently Type of Home: House Home Access: Stairs to enter   Entrance Stairs-Number of Steps: 4 Home Layout: One level Home Equipment: None Additional Comments: Wife reports she may have access to crutches and a walker from parents.     Prior Function Level of Independence: Independent               Hand Dominance        Extremity/Trunk Assessment   Upper Extremity Assessment: Defer to OT evaluation           Lower Extremity Assessment: Overall WFL for tasks assessed      Cervical / Trunk Assessment: Normal  Communication  Communication: No difficulties  Cognition Arousal/Alertness: Awake/alert Behavior During Therapy: WFL for tasks assessed/performed Overall Cognitive Status: Impaired/Different from baseline Area of Impairment: Problem solving             Problem Solving: Slow processing;Difficulty sequencing;Requires verbal cues;Decreased initiation General Comments: Slow to respond and initiate movement at times.      General Comments      Exercises        Assessment/Plan    PT Assessment Patient needs continued PT services  PT Diagnosis Difficulty walking;Acute pain   PT Problem List Decreased strength;Decreased range of motion;Decreased activity tolerance;Decreased balance;Decreased mobility;Decreased knowledge of use of DME;Decreased safety awareness;Decreased knowledge of precautions;Pain  PT Treatment Interventions DME instruction;Stair training;Gait training;Functional mobility training;Therapeutic activities;Therapeutic exercise;Neuromuscular re-education;Patient/family education   PT Goals (Current goals can be found in the Care Plan section) Acute Rehab PT Goals Patient Stated Goal: Decrease pain PT Goal Formulation: With patient Time For Goal Achievement: 01/18/16 Potential to Achieve Goals: Good    Frequency Min 3X/week   Barriers to discharge        Co-evaluation               End of Session   Activity Tolerance: Patient limited by pain Patient left: in chair;with call bell/phone within reach;with family/visitor present Nurse Communication: Mobility status         Time: 1610-9604 PT Time Calculation (min) (ACUTE ONLY): 24 min   Charges:   PT Evaluation $PT Eval Moderate Complexity: 1 Procedure PT Treatments $Gait Training: 8-22 mins   PT G Codes:        Conni Slipper February 02, 2016, 10:43 AM  Conni Slipper, PT, DPT Acute Rehabilitation Services Pager: [phone]

## 2016-01-11 NOTE — Progress Notes (Signed)
Patient ID: Drew Prince, male   DOB: June 20, 1976, 40 y.o.   MRN: [id number]   Subjective: No acute events overnight. He reports having a "rough night" last night with increased pain. He reports his pain is mildly better this morning. He did not have a BM yesterday or today.   Objective: Vital signs in last 24 hours: Filed Vitals:   01/11/16 0133 01/11/16 0217 01/11/16 0400 01/11/16 0544  BP: 142/89   154/84  Pulse: 109   109  Temp: 98.6 F (37 C)   99 F (37.2 C)  TempSrc: Oral   Oral  Resp: 16 14 12 16   Height:      Weight:      SpO2: 100% 100% 99% 99%   Physical exam: General: resting in bed, still diaphoretic, appears uncomfortable  Abd: BS+, distended- about same, diffuse tenderness Ext: warm and well perfused, without pedal edema  Lab Results: Basic Metabolic Panel:  Recent Labs Lab 01/10/16 0421 01/11/16 0505  NA 137 135  K 3.9 3.8  CL 97* 99*  CO2 32 28  GLUCOSE 114* 194*  BUN <5* <5*  CREATININE 0.76 0.58*  CALCIUM 8.2* 8.1*  MG  --  2.0  PHOS  --  2.2*   Liver Function Tests:  Recent Labs Lab 01/10/16 0421 01/11/16 0505  AST 43* 78*  ALT 68* 97*  ALKPHOS 57 96  BILITOT 1.4* 0.8  PROT 5.9* 6.0*  ALBUMIN 2.5* 2.4*   CBC:  Recent Labs Lab 01/05/16 0615  01/10/16 0421 01/11/16 0505  WBC 16.8*  < > 13.9* 13.4*  NEUTROABS 14.3*  --   --   --   HGB 16.7  < > 12.4* 11.8*  HCT 50.1  < > 36.9* 35.2*  MCV 81.5  < > 81.3 80.7  PLT 347  < > 305 344  < > = values in this interval not displayed.  Medications: I have reviewed the patient's current medications. Scheduled Meds: . enoxaparin (LOVENOX) injection  40 mg Subcutaneous Q24H  . HYDROmorphone   Intravenous 6 times per day  . imipenem-cilastatin  500 mg Intravenous Q6H  . insulin aspart  0-9 Units Subcutaneous 4 times per day  . potassium phosphate IVPB (mmol)  15 mmol Intravenous Once   Continuous Infusions: . dextrose 5 % and 0.9% NaCl 100 mL/hr at 01/11/16 0557  . dextrose 5 % and  0.9% NaCl    . Marland Kitchen.TPN (CLINIMIX-E) Adult 40 mL/hr at 01/10/16 1742   And  . fat emulsion 240 mL (01/10/16 1742)  . Marland Kitchen.TPN (CLINIMIX-E) Adult     And  . fat emulsion     PRN Meds:.diphenhydrAMINE **OR** diphenhydrAMINE, naloxone **AND** sodium chloride, ondansetron (ZOFRAN) IV, sodium chloride   Assessment/Plan:  Acute pancreatitis thought to be from transient gallstone: Repeat CT Abd/Pelvis with evidence of new areas of necrosis in tail of pancreas. No abscess or pseudocyst. Will continue the course with IVFs, pain control, and TPN. Hopefully will start to see improvement soon.  - Gen. surgery following, appreciate recommendations - Continue TPN. Will advance diet as tolerated, want to avoid prolonged TPN as risk for infection - Continue D5 NS at 125cc/hr - Increase PCA settings slightly to get better control of pain - Holding home medications - Ondansetron 4 mg every 6 hours as needed for nausea  Diet: NPO, TPN VTE PPx: Lovenox SQ Dispo: Disposition is deferred at this time, awaiting improvement of current medical problems.  Anticipated discharge in approximately 3-5 day(s).   The patient  does have a current PCP [facility]) and does need an [facility] hospital follow-up appointment after discharge.  The patient does not know have transportation limitations that hinder transportation to clinic appointments.  .Services Needed at time of discharge: Y = Yes, Blank = No PT:   OT:   RN:   Equipment:   Other:     LOS: 6 days   Su Hoff, MD 01/11/2016, 8:10 AM

## 2016-01-12 LAB — COMPREHENSIVE METABOLIC PANEL
ALK PHOS: 105 U/L (ref 38–126)
ALT: 99 U/L — ABNORMAL HIGH (ref 17–63)
ANION GAP: 9 (ref 5–15)
AST: 65 U/L — ABNORMAL HIGH (ref 15–41)
Albumin: 2.6 g/dL — ABNORMAL LOW (ref 3.5–5.0)
BILIRUBIN TOTAL: 0.8 mg/dL (ref 0.3–1.2)
BUN: 6 mg/dL (ref 6–20)
CALCIUM: 8.5 mg/dL — AB (ref 8.9–10.3)
CO2: 28 mmol/L (ref 22–32)
Chloride: 99 mmol/L — ABNORMAL LOW (ref 101–111)
Creatinine, Ser: 0.64 mg/dL (ref 0.61–1.24)
GLUCOSE: 122 mg/dL — AB (ref 65–99)
POTASSIUM: 3.5 mmol/L (ref 3.5–5.1)
Sodium: 136 mmol/L (ref 135–145)
Total Protein: 6.4 g/dL — ABNORMAL LOW (ref 6.5–8.1)

## 2016-01-12 LAB — GLUCOSE, CAPILLARY
GLUCOSE-CAPILLARY: 108 mg/dL — AB (ref 65–99)
GLUCOSE-CAPILLARY: 122 mg/dL — AB (ref 65–99)

## 2016-01-12 LAB — CBC
HEMATOCRIT: 36.1 % — AB (ref 39.0–52.0)
Hemoglobin: 12.3 g/dL — ABNORMAL LOW (ref 13.0–17.0)
MCH: 26.9 pg (ref 26.0–34.0)
MCHC: 34.1 g/dL (ref 30.0–36.0)
MCV: 78.8 fL (ref 78.0–100.0)
PLATELETS: 402 10*3/uL — AB (ref 150–400)
RBC: 4.58 MIL/uL (ref 4.22–5.81)
RDW: 13.4 % (ref 11.5–15.5)
WBC: 13.4 10*3/uL — ABNORMAL HIGH (ref 4.0–10.5)

## 2016-01-12 LAB — PHOSPHORUS: Phosphorus: 2.8 mg/dL (ref 2.5–4.6)

## 2016-01-12 LAB — MAGNESIUM: Magnesium: 1.9 mg/dL (ref 1.7–2.4)

## 2016-01-12 MED ORDER — FAT EMULSION 20 % IV EMUL
240.0000 mL | INTRAVENOUS | Status: AC
Start: 2016-01-12 — End: 2016-01-13
  Administered 2016-01-12: 240 mL via INTRAVENOUS
  Filled 2016-01-12: qty 250

## 2016-01-12 MED ORDER — POTASSIUM CHLORIDE 10 MEQ/50ML IV SOLN
10.0000 meq | INTRAVENOUS | Status: AC
  Administered 2016-01-12 (×3): 10 meq via INTRAVENOUS
  Filled 2016-01-12 (×3): qty 50

## 2016-01-12 MED ORDER — CLINIMIX E/DEXTROSE (5/15) 5 % IV SOLN
INTRAVENOUS | Status: AC
Start: 2016-01-12 — End: 2016-01-13
  Administered 2016-01-12: 18:00:00 via INTRAVENOUS
  Filled 2016-01-12: qty 2000

## 2016-01-12 MED ORDER — MAGNESIUM SULFATE IN D5W 10-5 MG/ML-% IV SOLN
1.0000 g | Freq: Once | INTRAVENOUS | Status: AC
  Administered 2016-01-12: 1 g via INTRAVENOUS
  Filled 2016-01-12: qty 100

## 2016-01-12 NOTE — Progress Notes (Signed)
PARENTERAL NUTRITION CONSULT NOTE   Pharmacy Consult for TPN Indication: severe pancreatitis and ileus  Allergies  Allergen Reactions  . Penicillins Rash    Patient Measurements: Height: 5\' 4"  (162.6 cm) Weight: 186 lb 11.7 oz (84.7 kg) IBW/kg (Calculated) : 59.2    Vital Signs: Temp: 98.4 F (36.9 C) (01/15 0605) Temp Source: Oral (01/15 0605) BP: 151/94 mmHg (01/15 0605) Pulse Rate: 100 (01/15 0605) Intake/Output from previous day: 01/14 0701 - 01/15 0700 In: -  Out: 750 [Urine:750] Intake/Output from this shift:    Labs:  Recent Labs  01/09/16 1054 01/10/16 0421 01/11/16 0505 01/12/16 0451  WBC  --  13.9* 13.4* 13.4*  HGB  --  12.4* 11.8* 12.3*  HCT  --  36.9* 35.2* 36.1*  PLT  --  305 344 402*  APTT 30  --   --   --   INR 1.11  --   --   --      Recent Labs  01/10/16 0421 01/11/16 0505 01/12/16 0451  NA 137 135 136  K 3.9 3.8 3.5  CL 97* 99* 99*  CO2 32 28 28  GLUCOSE 114* 194* 122*  BUN <5* <5* 6  CREATININE 0.76 0.58* 0.64  CALCIUM 8.2* 8.1* 8.5*  MG  --  2.0 1.9  PHOS  --  2.2* 2.8  PROT 5.9* 6.0* 6.4*  ALBUMIN 2.5* 2.4* 2.6*  AST 43* 78* 65*  ALT 68* 97* 99*  ALKPHOS 57 96 105  BILITOT 1.4* 0.8 0.8  PREALBUMIN  --  7.6*  --   TRIG  --  236*  --    Estimated Creatinine Clearance: 120.5 mL/min (by C-G formula based on Cr of 0.64).    Recent Labs  01/11/16 2140 01/12/16 0008 01/12/16 0641  GLUCAP 101* 108* 122*    Insulin Requirements in the past 24 hours:  1 - will DC SSI and CBG checks  Current Nutrition:  NPO x ice chips TPN 83 + IVFE 10 D5NS 60  Assessment: 40 yo M with gallstone pancreatitis and ileus.  Pharmacy consulted to dose TPN for nutrition support.  GI: gallstone pancreatitis, for cholecystectomy once pancreatitis is better; ileus.  1/13 CT abd:  Significant increase in peripancreatic & RP fluid - c/w infarction or early necrosis  Nutritional Goals: per RD 1/13 Kcal: 1900-2100 Protein: 95-110  grams Fluid: 1.9-2.1 L  1/14 prealbumin 7.6 - low due to inflammatory process of pancreatitis  Renal: creat WNL, K 3.5 after 22 meq K, phos 2.8 after 15 mM kphos,  mag 1.9  D5ns at 60 ml/hr  Endo: no hx DM, all CBGs < 150 w/ TPN at goal, DC SSI/CBGs  Hepatic: PMH HLD, on fenofibrate and atorvastatin PTA, trig 236 1/14  ID:  WBC holding steady at 13.4, Tmax 99, no micro data  Imipenem 1/13>>  Access: PICC placed for TPN 1/13  TPN: 1/13>>  Plan:  -continue Clinimix E 5/15 at 83 ml/hr plus IVFE at 10 ml/hr.  This provides 100 gm protein and 1900 kcals to meet 100% of goals.  -  IVF at 60 ml/hr to keep total IVF ~ 150 ml/hr  - mag 1 gm followed by 3 runs K - continue MVI/TE in TPN - DC SSI/CBGs - TPN labs in am  Herby AbrahamMichelle T. Bridney Guadarrama, Pharm.D. [phone] 01/12/2016 7:30 AM

## 2016-01-12 NOTE — Progress Notes (Signed)
Patient ID: Drew Prince, male   DOB: July 02, 1976, 40 y.o.   MRN: [id number]   Subjective: No acute events overnight. Pain seems mildly better today and he is less distended. He reports he sat up in the chair yesterday. He did not have a BM yesterday or this morning. He notes lower back pain.  Objective: Vital signs in last 24 hours: Filed Vitals:   01/12/16 0000 01/12/16 0156 01/12/16 0400 01/12/16 0605  BP:  150/95  151/94  Pulse:  87  100  Temp:  98.2 F (36.8 C)  98.4 F (36.9 C)  TempSrc:  Oral  Oral  Resp: 16 16 18 16   Height:      Weight:      SpO2: 99% 99% 100% 100%   Physical exam: General: resting in bed, still diaphoretic, appears uncomfortable  CV: tachycardic in 100s, no m/g/r Abd: BS+, distention improved, diffuse tenderness to palpation  Ext: warm and well perfused, without pedal edema Skin: no evidence of bleeding, petechiae. No grey-turner's sign.  Lab Results: Basic Metabolic Panel:  Recent Labs Lab 01/11/16 0505 01/12/16 0451  NA 135 136  K 3.8 3.5  CL 99* 99*  CO2 28 28  GLUCOSE 194* 122*  BUN <5* 6  CREATININE 0.58* 0.64  CALCIUM 8.1* 8.5*  MG 2.0 1.9  PHOS 2.2* 2.8   Liver Function Tests:  Recent Labs Lab 01/11/16 0505 01/12/16 0451  AST 78* 65*  ALT 97* 99*  ALKPHOS 96 105  BILITOT 0.8 0.8  PROT 6.0* 6.4*  ALBUMIN 2.4* 2.6*   CBC:  Recent Labs Lab 01/11/16 0505 01/12/16 0451  WBC 13.4* 13.4*  HGB 11.8* 12.3*  HCT 35.2* 36.1*  MCV 80.7 78.8  PLT 344 402*    Medications: I have reviewed the patient's current medications. Scheduled Meds: . enoxaparin (LOVENOX) injection  40 mg Subcutaneous Q24H  . HYDROmorphone   Intravenous 6 times per day  . imipenem-cilastatin  500 mg Intravenous Q6H  . insulin aspart  0-9 Units Subcutaneous 4 times per day   Continuous Infusions: . dextrose 5 % and 0.9% NaCl 60 mL/hr at 01/11/16 1804  . Marland KitchenTPN (CLINIMIX-E) Adult 83 mL/hr at 01/11/16 1754   And  . fat emulsion 240 mL (01/11/16  1754)   PRN Meds:.diphenhydrAMINE **OR** diphenhydrAMINE, naloxone **AND** sodium chloride, ondansetron (ZOFRAN) IV, sodium chloride   Assessment/Plan:  Acute pancreatitis thought to be from transient gallstone: Repeat CT Abd/Pelvis with evidence of new areas of necrosis in tail of pancreas. No abscess or pseudocyst. He seems to have improved a little bit clinically with less abdominal distention and mildly improved pain. Will continue the course with IVFs, pain control, and TPN. Encouraged patient to get out of bed and to chair/walking. - Gen. surgery following, appreciate recommendations - Continue TPN. Could consider clears tomorrow if continues to do well today. - Continue D5 NS at 60 cc/hr - Continue PCA for pain control - Continue Primaxin 500 mg IV Q6H - Holding home medications - Ondansetron 4 mg every 6 hours as needed for nausea  Diet: NPO, TPN VTE PPx: Lovenox SQ Dispo: Disposition is deferred at this time, awaiting improvement of current medical problems.  Anticipated discharge in approximately 3-5 day(s).   The patient does have a current PCP [facility]) and does need an [facility] hospital follow-up appointment after discharge.  The patient does not know have transportation limitations that hinder transportation to clinic appointments.  .Services Needed at time of discharge: Y = Yes,  Blank = No PT:   OT:   RN:   Equipment:   Other:     LOS: 7 days   Su Hoffarly J Rivet, MD 01/12/2016, 7:26 AM

## 2016-01-12 NOTE — Progress Notes (Signed)
Patient ID: Drew Prince, male   DOB: [birth year], 40 y.o.   MRN: [id number]  General Surgery - [facility]  HD#: 8  Subjective: Patient in bed.  Made it up to chair yesterday with PT.  Persistent abd pain and low back pain.  Objective: Vital signs in last 24 hours: Temp:  [98 F (36.7 C)-99.7 F (37.6 C)] 98.4 F (36.9 C) (01/15 0605) Pulse Rate:  [87-108] 100 (01/15 0605) Resp:  [12-20] 20 (01/15 0739) BP: (139-151)/(82-95) 151/94 mmHg (01/15 0605) SpO2:  [94 %-100 %] 100 % (01/15 0739) Last BM Date: 01/08/16  Intake/Output from previous day: 01/14 0701 - 01/15 0700 In: -  Out: 750 [Urine:750] Intake/Output this shift:    Physical Exam: HEENT - sclerae clear, mucous membranes moist Abdomen - soft, mild distension; rare BS present; diffuse tenderness max in epigastrium; no mass Ext - no edema, non-tender Neuro - alert & oriented, no focal deficits  Lab Results:   Recent Labs  01/11/16 0505 01/12/16 0451  WBC 13.4* 13.4*  HGB 11.8* 12.3*  HCT 35.2* 36.1*  PLT 344 402*   BMET  Recent Labs  01/11/16 0505 01/12/16 0451  NA 135 136  K 3.8 3.5  CL 99* 99*  CO2 28 28  GLUCOSE 194* 122*  BUN <5* 6  CREATININE 0.58* 0.64  CALCIUM 8.1* 8.5*   PT/INR  Recent Labs  01/09/16 1054  LABPROT 14.5  INR 1.11   Comprehensive Metabolic Panel:    Component Value Date/Time   NA 136 01/12/2016 0451   NA 135 01/11/2016 0505   K 3.5 01/12/2016 0451   K 3.8 01/11/2016 0505   CL 99* 01/12/2016 0451   CL 99* 01/11/2016 0505   CO2 28 01/12/2016 0451   CO2 28 01/11/2016 0505   BUN 6 01/12/2016 0451   BUN <5* 01/11/2016 0505   CREATININE 0.64 01/12/2016 0451   CREATININE 0.58* 01/11/2016 0505   GLUCOSE 122* 01/12/2016 0451   GLUCOSE 194* 01/11/2016 0505   CALCIUM 8.5* 01/12/2016 0451   CALCIUM 8.1* 01/11/2016 0505   AST 65* 01/12/2016 0451   AST 78* 01/11/2016 0505   ALT 99* 01/12/2016 0451   ALT 97* 01/11/2016 0505   ALKPHOS 105  01/12/2016 0451   ALKPHOS 96 01/11/2016 0505   BILITOT 0.8 01/12/2016 0451   BILITOT 0.8 01/11/2016 0505   PROT 6.4* 01/12/2016 0451   PROT 6.0* 01/11/2016 0505   ALBUMIN 2.6* 01/12/2016 0451   ALBUMIN 2.4* 01/11/2016 0505    Studies/Results: Ct Abdomen Pelvis W Contrast  01/10/2016  CLINICAL DATA:  Pancreatitis follow-up. History of hypertension and hypercholesterolemia. EXAM: CT ABDOMEN AND PELVIS WITH CONTRAST TECHNIQUE: Multidetector CT imaging of the abdomen and pelvis was performed using the standard protocol following bolus administration of intravenous contrast. CONTRAST:  OMNIPAQUE IOHEXOL 300 MG/ML  SOLN COMPARISON:  01/05/2016 FINDINGS: Lower chest: Small new bilateral pleural effusions are present. There is new left basilar atelectasis or early consolidation. Upper abdomen: The liver is diffusely low in attenuation and consistent with hepatic steatosis. No focal liver lesions are identified. No focal abnormality identified within the spleen or left kidney. Small right renal cyst is identified. There is significant peripancreatic and retroperitoneal fluid, significantly increased since the prior study. There is a 2.0 x 2.9 cm area of the distal pancreatic body showing poor enhancement on today's exam, consistent with infarction or early necrosis. A similar area of poor enhancement is identified along the ventral aspect of the mid pancreas measuring  1.3 x 3.6 cm. There is significant fluid within the lesser sac. However no discrete fluid collections are identified to indicate pseudocyst or abscess. Gallstones are better seen on the prior study. Gastrointestinal tract: Partial malrotation of the small bowel. Jejunal loops distended in the right abdomen. Appendix is normal in appearance. Colonic loops contain residual contrast following recent CT exam and are otherwise unremarkable. Pelvis: Urinary bladder is distended. The prostate gland and seminal vesicles have a normal appearance. Small  amount of free pelvic fluid noted. Retroperitoneum: No evidence for aortic aneurysm. Small retroperitoneal lymph nodes are nonspecific and likely reactive. Abdominal wall: Mild body wall edema. Osseous structures: Unremarkable. IMPRESSION: 1. Interval progression of peripancreatic and retroperitoneal exam dates. 2. New areas of pancreatic ischemia or necrosis. 3. No pseudocyst or abscess identified. 4. New small bilateral pleural effusions and bibasilar atelectasis or early consolidation. 5. Hepatic steatosis. 6. Partial malrotation of the small bowel incidentally noted. No bowel obstruction. 7. Mild body wall edema. The salient findings were discussed with Dr. Derrell Lollingamirez on 01/10/2016 at 1:32 pm. Electronically Signed   By: Norva PavlovElizabeth  Brown M.D.   On: 01/10/2016 13:32   Dg Chest Port 1 View  01/10/2016  CLINICAL DATA:  Line placement. EXAM: PORTABLE CHEST 1 VIEW COMPARISON:  None. FINDINGS: Right PICC line noted with tip at cavoatrial junction. Low lung volumes with mild bibasilar atelectasis. Small left pleural effusion. No pneumothorax. IMPRESSION: 1. Right PICC line noted with tip in cavoatrial junction. 2. Low lung volumes with mild bibasilar atelectasis and left pleural effusion. Electronically Signed   By: Maisie Fushomas  Register   On: 01/10/2016 14:42    Anti-infectives: Anti-infectives    Start     Dose/Rate Route Frequency Ordered Stop   01/10/16 1600  imipenem-cilastatin (PRIMAXIN) 500 mg in sodium chloride 0.9 % 100 mL IVPB     500 mg 200 mL/hr over 30 Minutes Intravenous Every 6 hours 01/10/16 1439        Assessment & Plans: Acute biliary pancreatitis  WBC 13K, LFT's improved but slightly elevated, last lipase level normal  On TNA, NPO  Encouraged OOB, ambulation  Continue to follow - slow improvement - timing of cholecystectomy to be determined  Velora Hecklerodd M. Tijana Walder, MD, [facility] Office: [phone]   Dannie Woolen Judie PetitM 01/12/2016

## 2016-01-13 ENCOUNTER — Inpatient Hospital Stay (HOSPITAL_COMMUNITY): Payer: BLUE CROSS/BLUE SHIELD

## 2016-01-13 DIAGNOSIS — K8080 Other cholelithiasis without obstruction: Secondary | ICD-10-CM

## 2016-01-13 LAB — COMPREHENSIVE METABOLIC PANEL
ALBUMIN: 2.8 g/dL — AB (ref 3.5–5.0)
ALT: 87 U/L — ABNORMAL HIGH (ref 17–63)
ANION GAP: 8 (ref 5–15)
AST: 53 U/L — AB (ref 15–41)
Alkaline Phosphatase: 106 U/L (ref 38–126)
BILIRUBIN TOTAL: 0.7 mg/dL (ref 0.3–1.2)
BUN: 8 mg/dL (ref 6–20)
CHLORIDE: 99 mmol/L — AB (ref 101–111)
CO2: 27 mmol/L (ref 22–32)
Calcium: 8.5 mg/dL — ABNORMAL LOW (ref 8.9–10.3)
Creatinine, Ser: 0.67 mg/dL (ref 0.61–1.24)
GFR calc Af Amer: >60 mL/min (ref >60)
GFR calc non Af Amer: >60 mL/min (ref >60)
GLUCOSE: 132 mg/dL — AB (ref 65–99)
POTASSIUM: 3.8 mmol/L (ref 3.5–5.1)
SODIUM: 134 mmol/L — AB (ref 135–145)
TOTAL PROTEIN: 6.4 g/dL — AB (ref 6.5–8.1)

## 2016-01-13 LAB — CBC
HEMATOCRIT: 36.6 % — AB (ref 39.0–52.0)
HEMOGLOBIN: 12.3 g/dL — AB (ref 13.0–17.0)
MCH: 26.3 pg (ref 26.0–34.0)
MCHC: 33.6 g/dL (ref 30.0–36.0)
MCV: 78.4 fL (ref 78.0–100.0)
Platelets: 480 10*3/uL — ABNORMAL HIGH (ref 150–400)
RBC: 4.67 MIL/uL (ref 4.22–5.81)
RDW: 13.5 % (ref 11.5–15.5)
WBC: 14.6 10*3/uL — ABNORMAL HIGH (ref 4.0–10.5)

## 2016-01-13 LAB — TRIGLYCERIDES: Triglycerides: 183 mg/dL — ABNORMAL HIGH (ref <150)

## 2016-01-13 LAB — MAGNESIUM: Magnesium: 2 mg/dL (ref 1.7–2.4)

## 2016-01-13 LAB — PHOSPHORUS: PHOSPHORUS: 3.5 mg/dL (ref 2.5–4.6)

## 2016-01-13 MED ORDER — CLINIMIX E/DEXTROSE (5/15) 5 % IV SOLN
INTRAVENOUS | Status: AC
Start: 2016-01-13 — End: 2016-01-14
  Administered 2016-01-13: 18:00:00 via INTRAVENOUS
  Filled 2016-01-13: qty 1992

## 2016-01-13 MED ORDER — HYDROMORPHONE 1 MG/ML IV SOLN
INTRAVENOUS | Status: DC
  Administered 2016-01-13: 1 mg via INTRAVENOUS
  Filled 2016-01-13: qty 25

## 2016-01-13 MED ORDER — HYDROMORPHONE 1 MG/ML IV SOLN
INTRAVENOUS | Status: DC
  Administered 2016-01-13: 4.61 mg via INTRAVENOUS
  Administered 2016-01-13: 11.8 mg via INTRAVENOUS
  Administered 2016-01-14: 4.32 mg via INTRAVENOUS
  Administered 2016-01-14: 9 mg via INTRAVENOUS
  Administered 2016-01-14 (×3): via INTRAVENOUS
  Administered 2016-01-15: 1 mg via INTRAVENOUS
  Administered 2016-01-15: 2.5 mg via INTRAVENOUS
  Administered 2016-01-15: 9.5 mg via INTRAVENOUS
  Administered 2016-01-15: 02:00:00 via INTRAVENOUS
  Administered 2016-01-16: 4 mg via INTRAVENOUS
  Administered 2016-01-16: 1.5 mg via INTRAVENOUS
  Administered 2016-01-16: 4 mg via INTRAVENOUS
  Administered 2016-01-17: 3.5 mg via INTRAVENOUS
  Administered 2016-01-17: 25 mg via INTRAVENOUS
  Filled 2016-01-13 (×4): qty 25

## 2016-01-13 MED ORDER — FAT EMULSION 20 % IV EMUL
240.0000 mL | INTRAVENOUS | Status: AC
  Administered 2016-01-13: 240 mL via INTRAVENOUS
  Filled 2016-01-13: qty 250

## 2016-01-13 NOTE — Progress Notes (Signed)
Cortrak team placed NG tube. Stat KUB ordered to check placement. Patient frequently making gagging noises and states the he "feels miserable." Pt started vomiting. IV Zofran given. Pt continued to make gagging noises and experience nausea. MD notified and stated to pull NG tube. Writer pulled NG tube. Patient stated that he was feeling a little better. Will continue to monitor.

## 2016-01-13 NOTE — Progress Notes (Signed)
Patient ID: Drew Prince, male   DOB: 12/28/1976, 40 y.o.   MRN: [id number]   Subjective: Drew Prince's pain persists today but his abdominal distention is improved. No fevers or nausea overnight. He tells me he forgets to push the PCA button sometimes and would like to increase the infusion rate which I did. I showed him the abdominal CT images and he asked if I could come show his wife later.  Objective: Vital signs in last 24 hours: Filed Vitals:   01/13/16 0154 01/13/16 0400 01/13/16 0602 01/13/16 0758  BP: 148/89  144/91   Pulse: 94  96   Temp: 98 F (36.7 C)  99 F (37.2 C)   TempSrc: Oral  Oral   Resp: 16 18 16 17   Height:      Weight:      SpO2: 100% 100% 97% 100%   Physical exam: General: [place] man resting in bed comfortably, appropriately conversational Cardiac: regular rate and rhythm, no rubs, murmurs or gallops Pulm: breathing well, decreased breath sounds in the left lower fields, otherwise clear Abd: much less distended compared to Friday, with some new ecchymoses in the left lower flank, mostly tender in the left lower quadrant without rebound or guarding Ext: warm and well perfused, without pedal edema  Lab Results: Basic Metabolic Panel:  Recent Labs Lab 01/12/16 0451 01/13/16 0601  NA 136 134*  K 3.5 3.8  CL 99* 99*  CO2 28 27  GLUCOSE 122* 132*  BUN 6 8  CREATININE 0.64 0.67  CALCIUM 8.5* 8.5*  MG 1.9 2.0  PHOS 2.8 3.5   Liver Function Tests:  Recent Labs Lab 01/12/16 0451 01/13/16 0601  AST 65* 53*  ALT 99* 87*  ALKPHOS 105 106  BILITOT 0.8 0.7  PROT 6.4* 6.4*  ALBUMIN 2.6* 2.8*   CBC:  Recent Labs Lab 01/12/16 0451 01/13/16 0601  WBC 13.4* 14.6*  HGB 12.3* 12.3*  HCT 36.1* 36.6*  MCV 78.8 78.4  PLT 402* 480*   Medications: I have reviewed the patient's current medications. Scheduled Meds: . enoxaparin (LOVENOX) injection  40 mg Subcutaneous Q24H  . HYDROmorphone   Intravenous 6 times per day  . imipenem-cilastatin   500 mg Intravenous Q6H   Continuous Infusions: . dextrose 5 % and 0.9% NaCl 60 mL/hr at 01/13/16 0202  . Marland Kitchen.TPN (CLINIMIX-E) Adult 83 mL/hr at 01/12/16 1751   And  . fat emulsion 240 mL (01/12/16 1751)  . Marland Kitchen.TPN (CLINIMIX-E) Adult     And  . fat emulsion     PRN Meds:.diphenhydrAMINE **OR** diphenhydrAMINE, naloxone **AND** sodium chloride, ondansetron (ZOFRAN) IV, sodium chloride   Assessment/Plan:  Acute pancreatitis thought to be from transient gallstone passage: He's now 8 days out from the onset of his pancreatitis with subsequent necrosis and is clinically slowly improving; no more fevers since we started the imipenem 3 days ago so we'll keep it for today and consider stopping it tomorrow. We'll hold the course today and consider trying clears tomorrow. -Thanks General Surgery for your help -Continue TPN -Continue D5 NS at 60 cc/hr -Continue PCA for pain control -Continue imipenem-cilastin 500 mg IV Q6H -Holding home medications -Will need outpatient cholecystectomy once improved  Dispo: Disposition is deferred at this time, awaiting improvement of current medical problems.  Anticipated discharge in approximately 2-3 day(s).   The patient does have a current PCP [facility] and [facility] after discharge.  The patient does not know have transportation  limitations that hinder transportation to clinic appointments.  .Services Needed at time of discharge: Y = Yes, Blank = No PT:   OT:   RN:   Equipment:   Other:     LOS: 8 days   Selina Cooley, MD 01/13/2016, 8:21 AM

## 2016-01-13 NOTE — Progress Notes (Signed)
PARENTERAL NUTRITION CONSULT NOTE   Pharmacy Consult for TPN Indication: severe pancreatitis and ileus  Allergies  Allergen Reactions  . Penicillins Rash   Patient Measurements: Height: 5\' 4"  (162.6 cm) Weight: 186 lb 11.7 oz (84.7 kg) IBW/kg (Calculated) : 59.2  Vital Signs: Temp: 99 F (37.2 C) (01/16 0602) Temp Source: Oral (01/16 0602) BP: 144/91 mmHg (01/16 0602) Pulse Rate: 96 (01/16 0602)   Admit: 40 yo M came in with stomach pain and N/V. Found to have gallstone pancreatitis and ileus.  Pharmacy consulted to dose TPN for nutrition support.  Nutritional Goals: per RD 1/13 Kcal: 1900-2100 Protein: 95-110 grams Fluid: 1.9-2.1 L  Current Nutrition:  NPO + ice chips TPN @ 5983ml/hr + IVFE @ 5010ml/hr.  This provides 100 gm protein and 1900 kcals to meet 100% of goals.  D5NS @ 5060ml/hr.  This provides 245 kcal  Insulin Requirements in the past 24 hours:  None  Surgeries/Procedures: Plan for cholecystectomy once stable  GI: Gallstone pancreatitis, for cholecystectomy once pancreatitis is better; ileus.  1/13 CT abd:  Significant increase in peripancreatic & RP fluid - c/w infarction or early necrosis. 1/14 prealbumin 7.6 - low due to inflammatory process of pancreatitis. Last BM was 1/9. May try clear liquids soon.  Endo: no hx DM, all CBGs < 150 w/ TPN at goal, SSI and CBGs stopped.  Lytes: Lytes wnl and stable. CoCa 9.2. Phos and Mg ok.  Renal: SCr wnl, D5NS at 60 ml/hr  Pulm: On 3L of [place]  Cards: BP ok and HR 90s  Hepatobil: PMH HLD, on fenofibrate and atorvastatin PTA. AST slightly elevated at 53, ALT 87. TBili ok at 0.7. Triglycerides are down to 183 from 236.  Neuro: dilaudid PCA  ID: Day #4 of abx for necrosis with acute pancreatitis. The new CT showed evidence of necrosis in tail of pancreas. Tmax in last 24 hrs is 99. WBC stable around 14.6.  1/13 Imipenem >>  Best Practices: enoxaparin  TPN Access:  PICC placed 1/13  TPN start date: 1/13  >>  Plan:  Continue Clinimix E5/15 at 83 ml/hr Continue IVFE at 10 ml/hr Continue D5NS at 60 ml/hr to keep total IVF ~150 ml/hr (Consider decreasing D5NS as fluid goals per RD are 1.9-2.1 L) This provides 100g of protein and 2139 kcal which meets 100% of patient needs Continue MVI and trace elements in TPN Monitor TPN labs F/U trial of clear liquids soon  Enzo BiNathan Derald Lorge, PharmD, [facility] Clinical Pharmacist Pager [phone] 01/13/2016 7:15 AM

## 2016-01-13 NOTE — Progress Notes (Signed)
Physical Therapy Treatment Patient Details Name: Drew Prince Drew Prince MRN: [id number] DOB: Apr 05, 1976 Today's Date: 01/13/2016    History of Present Illness Pt is a 40 y/o male who presents with acute moderate pancreatitis.     PT Comments    Pt has not been very mobile since being in the hospital. He was hesitant about getting up.   I had him marching in place with RW and doing sit to stands.  Pt said his legs tired quickly and he got a little dizzy.  I talked to him and wife about doing this every time when he gets up to chair - talked to them about how to increase endurance.  Pts abdominal pain decreased from 8 to 6 with activity.   Will walk with pt in hall next session.   Follow Up Recommendations  Supervision for mobility/OOB;Outpatient PT     Equipment Recommendations  3in1 (PT)    Recommendations for Other Services       Precautions / Restrictions Precautions Precautions: Fall Restrictions Weight Bearing Restrictions: No    Mobility  Bed Mobility Overal bed mobility: Modified Independent             General bed mobility comments: Increased time required due to pain. Pt was instructed in log roll for abdominal comfort.  Transfers Overall transfer level: Needs assistance Equipment used: Rolling walker (2 wheeled)   Sit to Stand: Supervision         General transfer comment: Supervision for safety. Pt reaching out for support but did not appear off balance. No assist required.   Ambulation/Gait Ambulation/Gait assistance: Min guard Ambulation Distance (Feet):  (pt marched in place 30 seconds twice with RW) Assistive device: Rolling walker (2 wheeled)           Stairs            Wheelchair Mobility    Modified Rankin (Stroke Patients Only)       Balance                                    Cognition Arousal/Alertness: Awake/alert Behavior During Therapy: WFL for tasks assessed/performed                         Exercises Other Exercises Other Exercises: Pt did sit to stands x 3 reps from side of bed - using his UEs    General Comments        Pertinent Vitals/Pain Pain Assessment: 0-10 Pain Score:  (8 beginning of sessio and 6 at end of session)    Home Living                      Prior Function            PT Goals (current goals can now be found in the care plan section) Progress towards PT goals: Progressing toward goals    Frequency  Min 3X/week    PT Plan      Co-evaluation             End of Session Equipment Utilized During Treatment: Oxygen (3L O2) Activity Tolerance: Patient limited by pain Patient left: in bed;with call bell/phone within reach;with family/visitor present     Time: 1310-1325 PT Time Calculation (min) (ACUTE ONLY): 15 min  Charges:  $Therapeutic Activity: 8-22 mins  G Codes:      Judson Roch 01/13/2016, 2:12 PM 01/13/2016   Ranae Palms, PT

## 2016-01-13 NOTE — Progress Notes (Signed)
[facility] Progress Note     Subjective: Pt still c/o significant pain, maybe even worse than yesterday.  No N/V, NPO on TPN. Up in chair this am.  No BM, but some flatus.    Objective: Vital signs in last 24 hours: Temp:  [98 F (36.7 C)-99 F (37.2 C)] 99 F (37.2 C) (01/16 0602) Pulse Rate:  [94-104] 96 (01/16 0602) Resp:  [16-23] 18 (01/16 0914) BP: (132-148)/(78-91) 144/91 mmHg (01/16 0602) SpO2:  [96 %-100 %] 100 % (01/16 0914) Last BM Date: 01/06/16  Intake/Output from previous day: 01/15 0701 - 01/16 0700 In: -  Out: 1976 [Urine:1976] Intake/Output this shift:    PE: Gen:  Alert, NAD, pleasant Card:  RRR, no M/G/R heard Pulm:  CTA, no W/R/R Abd: Soft, mildly distended, diffusely tender, +BS, no HSM   Lab Results:   Recent Labs  01/12/16 0451 01/13/16 0601  WBC 13.4* 14.6*  HGB 12.3* 12.3*  HCT 36.1* 36.6*  PLT 402* 480*   BMET  Recent Labs  01/12/16 0451 01/13/16 0601  NA 136 134*  K 3.5 3.8  CL 99* 99*  CO2 28 27  GLUCOSE 122* 132*  BUN 6 8  CREATININE 0.64 0.67  CALCIUM 8.5* 8.5*   PT/INR No results for input(s): LABPROT, INR in the last 72 hours. CMP     Component Value Date/Time   NA 134* 01/13/2016 0601   K 3.8 01/13/2016 0601   CL 99* 01/13/2016 0601   CO2 27 01/13/2016 0601   GLUCOSE 132* 01/13/2016 0601   BUN 8 01/13/2016 0601   CREATININE 0.67 01/13/2016 0601   CALCIUM 8.5* 01/13/2016 0601   PROT 6.4* 01/13/2016 0601   ALBUMIN 2.8* 01/13/2016 0601   AST 53* 01/13/2016 0601   ALT 87* 01/13/2016 0601   ALKPHOS 106 01/13/2016 0601   BILITOT 0.7 01/13/2016 0601   GFRNONAA >60 01/13/2016 0601   GFRAA >60 01/13/2016 0601   Lipase     Component Value Date/Time   LIPASE 16 01/09/2016 0532       Studies/Results: No results found.  Anti-infectives: Anti-infectives    Start     Dose/Rate Route Frequency Ordered Stop   01/10/16 1600  imipenem-cilastatin (PRIMAXIN) 500 mg in sodium chloride 0.9 % 100 mL  IVPB     500 mg 200 mL/hr over 30 Minutes Intravenous Every 6 hours 01/10/16 1439         Assessment/Plan 1. Gallstone pancreatitis with necrosis -Patient has a severe case of pancreatitis with necrosis without pseudocyst -Strongly recommend NPO, PICC/TNA. -Currently on Primaxin, WBC 13K -Encouraged the patient to try his best to get OOB and mobilize. -No plans for lap chole this admission until pancreatitis is improved because it would be unsafe and higher likelihood for open procedure which would not be in the patietns best interst right now -Would recommend GI to follow pancreatitis and they can give recommendations regarding monitoring, additional treatments and, post pyloric tube feeds, etc. -We have nothing else to offer him this admission since surgery will be post-poned, so will sign off.  He can follow up with Dr. Derrell Lollingamirez as an outpatient once pancreatitis is improved in 3-4 weeks. 2. Anemia -Hgb improved to 12.3.  Dr. Andrey CampanileWilson discussed the above with the patient and his wife.  They are understanding of plan.   LOS: 8 days    Nonie HoyerMegan N Lemya Greenwell 01/13/2016, 10:14 AM Pager: [phone]

## 2016-01-14 DIAGNOSIS — K8501 Idiopathic acute pancreatitis with uninfected necrosis: Secondary | ICD-10-CM

## 2016-01-14 LAB — COMPREHENSIVE METABOLIC PANEL
ALK PHOS: 127 U/L — AB (ref 38–126)
ALT: 94 U/L — AB (ref 17–63)
AST: 71 U/L — AB (ref 15–41)
Albumin: 2.7 g/dL — ABNORMAL LOW (ref 3.5–5.0)
Anion gap: 8 (ref 5–15)
BUN: 9 mg/dL (ref 6–20)
CALCIUM: 8.7 mg/dL — AB (ref 8.9–10.3)
CO2: 28 mmol/L (ref 22–32)
CREATININE: 0.71 mg/dL (ref 0.61–1.24)
Chloride: 98 mmol/L — ABNORMAL LOW (ref 101–111)
Glucose, Bld: 129 mg/dL — ABNORMAL HIGH (ref 65–99)
Potassium: 3.9 mmol/L (ref 3.5–5.1)
Sodium: 134 mmol/L — ABNORMAL LOW (ref 135–145)
Total Bilirubin: 0.8 mg/dL (ref 0.3–1.2)
Total Protein: 6.7 g/dL (ref 6.5–8.1)

## 2016-01-14 LAB — CBC
HCT: 36.9 % — ABNORMAL LOW (ref 39.0–52.0)
Hemoglobin: 12.4 g/dL — ABNORMAL LOW (ref 13.0–17.0)
MCH: 26.5 pg (ref 26.0–34.0)
MCHC: 33.6 g/dL (ref 30.0–36.0)
MCV: 78.8 fL (ref 78.0–100.0)
PLATELETS: 457 10*3/uL — AB (ref 150–400)
RBC: 4.68 MIL/uL (ref 4.22–5.81)
RDW: 13.7 % (ref 11.5–15.5)
WBC: 16 10*3/uL — AB (ref 4.0–10.5)

## 2016-01-14 LAB — GLUCOSE, CAPILLARY: Glucose-Capillary: 119 mg/dL — ABNORMAL HIGH (ref 65–99)

## 2016-01-14 MED ORDER — TRACE MINERALS CR-CU-MN-SE-ZN 10-1000-500-60 MCG/ML IV SOLN
INTRAVENOUS | Status: AC
  Administered 2016-01-14: 18:00:00 via INTRAVENOUS
  Filled 2016-01-14: qty 1992

## 2016-01-14 MED ORDER — FAT EMULSION 20 % IV EMUL
240.0000 mL | INTRAVENOUS | Status: AC
  Administered 2016-01-14: 240 mL via INTRAVENOUS
  Filled 2016-01-14: qty 250

## 2016-01-14 NOTE — Progress Notes (Signed)
Patient ID: Drew Prince, male   DOB: [birth year], 40 y.o.   MRN: [id number]   Subjective: Drew Prince is still feeling the left-sided abdominal pain, about the same as yesterday. He wanted to try some sips today, so I changed his diet and encouraged him to go slow. Yesterday, he tried to get a postpyloric nasogastric tube, but he was very nauseous, so I had it pulled. He is requesting update his wife, and I've given her a call.  Objective: Vital signs in last 24 hours: Filed Vitals:   01/14/16 0000 01/14/16 0132 01/14/16 0218 01/14/16 0613  BP:  139/80  129/79  Pulse:  97  91  Temp:  98.6 F (37 C)  98.2 F (36.8 C)  TempSrc:  Oral  Oral  Resp: Height:      Weight:      SpO2: 97% 98% 95% 100%   Physical exam: General: Drew Prince resting in bed pretty uncomfortable, appropriately conversational Abd: much less distended compared to 5 days ago, with some ecchymoses in the left lower flank, mostly tender in the left lower quadrant without rebound or guarding Ext: warm and well perfused, without pedal edema   Lab Results: Basic Metabolic Panel:  Recent Labs Lab 01/12/16 0451 01/13/16 0601 01/14/16 0434  NA 136 134* 134*  K 3.5 3.8 3.9  CL 99* 99* 98*  CO2 GLUCOSE 122* 132* 129*  BUN CREATININE 0.64 0.67 0.71  CALCIUM 8.5* 8.5* 8.7*  MG 1.9 2.0  --   PHOS 2.8 3.5  --    Liver Function Tests:  Recent Labs Lab 01/13/16 0601 01/14/16 0434  AST 53* 71*  ALT 87* 94*  ALKPHOS 106 127*  BILITOT 0.7 0.8  PROT 6.4* 6.7  ALBUMIN 2.8* 2.7*   CBC:  Recent Labs Lab 01/13/16 0601 01/14/16 0434  WBC 14.6* 16.0*  HGB 12.3* 12.4*  HCT 36.6* 36.9*  MCV 78.4 78.8  PLT 480* 457*   Medications: I have reviewed the patient's current medications. Scheduled Meds: . enoxaparin (LOVENOX) injection  40 mg Subcutaneous Q24H  . HYDROmorphone   Intravenous 6 times per day  . imipenem-cilastatin  500 mg Intravenous Q6H   Continuous  Infusions: . dextrose 5 % and 0.9% NaCl 60 mL/hr at 01/13/16 0202  . Marland KitchenTPN (CLINIMIX-E) Adult 83 mL/hr at 01/13/16 1744   And  . fat emulsion 240 mL (01/13/16 1744)  . Marland KitchenTPN (CLINIMIX-E) Adult     And  . fat emulsion     PRN Meds:.diphenhydrAMINE **OR** diphenhydrAMINE, naloxone **AND** sodium chloride, ondansetron (ZOFRAN) IV, sodium chloride   Assessment/Plan:  Drew Prince is a 40 year old Drew Prince here with acute pancreatitis with necrosis that I suspect is either drug induced or perhaps idiopathic. He did have gallstones noted in his gallbladder, without biliary duct dilation or cholestatic changes in his liver function tests. It is now hospital day 9, and he has barely clinically improved. We tried to place a nasogastric tube yesterday, but he was nauseous and felt miserable, so we pulled it after a few hours. He is on total parenteral nutrition but would like to try small sips today. We've placed a call to gastroenterology to see if we are missing something.  Acute pancreatitis: Per above. -Thanks General Surgery for your help -Continue TPN -Continue D5 NS at 60 cc/hr -Continue PCA for pain control -Continue imipenem-cilastin 500 mg IV Q6H -Holding home medications -Will need outpatient cholecystectomy once improved  Hepatocellular transaminitis: He has had persistent mild transaminitis that I think is most likely from nonalcoholic fatty liver disease. We will check hepatitis serologies to be thorough. Platelets and INR look good. -Checking hepatitis A, B, and C serologies  Dispo: Disposition is deferred at this time, awaiting improvement of current medical problems.  Anticipated discharge in approximately 3-5 day(s).   The patient does have a [facility]CP [facility]) and does need an [facility] hospital follow-up appointment after discharge.  The patient does have transportation limitations that hinder transportation to clinic  appointments.  .Services Needed at time of discharge: Y = Yes, Blank = No PT:   OT:   RN:   Equipment:   Other:     LOS: 9 days   Selina Cooley, MD 01/14/2016, 10:58 AM

## 2016-01-14 NOTE — Progress Notes (Signed)
Physical Therapy Treatment Patient Details Name: Drew Prince MRN: [id number] DOB: 02/10/1976 Today's Date: 01/18/2016    History of Present Illness Pt is a 40 y/o male who presents with acute moderate pancreatitis.     PT Comments    Pt making good progress with mobility.  Follow Up Recommendations  Supervision - Intermittent     Equipment Recommendations  None recommended by PT    Recommendations for Other Services       Precautions / Restrictions Precautions Precautions: Fall Restrictions Weight Bearing Restrictions: No    Mobility  Bed Mobility Overal bed mobility: Modified Independent                Transfers Overall transfer level: Modified independent Equipment used: None Transfers: Sit to/from Stand Sit to Stand: Modified independent (Device/Increase time)            Ambulation/Gait Ambulation/Gait assistance: Min guard Ambulation Distance (Feet): 170 Feet Assistive device: None (Wall rail at times) Gait Pattern/deviations: Step-through pattern;Decreased stride length Gait velocity: Decreased Gait velocity interpretation: Below normal speed for age/gender General Gait Details: Use of wall rail intermittently for reassurance.   Stairs            Wheelchair Mobility    Modified Rankin (Stroke Patients Only)       Balance Overall balance assessment: No apparent balance deficits (not formally assessed)                                  Cognition Arousal/Alertness: Awake/alert Behavior During Therapy: WFL for tasks assessed/performed Overall Cognitive Status: Within Functional Limits for tasks assessed                      Exercises      General Comments        Pertinent Vitals/Pain Pain Assessment: No/denies pain    Home Living                      Prior Function            PT Goals (current goals can now be found in the care plan section) Progress towards PT goals: Progressing  toward goals    Frequency  Min 3X/week    PT Plan Discharge plan needs to be updated    Co-evaluation             End of Session Equipment Utilized During Treatment: Oxygen (3L O2) Activity Tolerance: Patient tolerated treatment well Patient left: in bed;with call bell/phone within reach     Time: 1116-1130 PT Time Calculation (min) (ACUTE ONLY): 14 min  Charges:  $Gait Training: 8-22 mins                    G Codes:      Drishti Pepperman Jan 18, 2016, 1:32 PM [facility] PT [phone]

## 2016-01-14 NOTE — Consult Note (Signed)
Reason for Consult: Gallstone pancreatitis with necrosis Referring Physician: Teaching Service.  Drew Prince HPI: This is a 40 year old male with a PMH of HTN and hypercholesterolemia admitted for acute pancreatitis.  On admission he was noted to have an AST of 581, ALTT 383 , AP 56, and TB of 1.2.  The patient's lipase was >3000.  The admission CT scan revealed pancreatitis and he was treated aggressively with IV hydration.  A 1 liter bolus of NS was provided and then he had IV fluids at 200 ml per hour.  Unfortunately he has been very slow to progress.  A repeat CT scan was performed and it was significant for necrosis in the distal portion of the pancreatic body as well as a ventral aspect of the mid pancreas.  Significant peripancreatic and retroperitoneal fluid was noted, but no evidence of pseudocysts or other fluid collections.  On both examinations gallstones were identified, but there is no biliary ductal dilation.  Over the course of the hospitalization his liver enzymes have normalized.  An IgG4 was obtained to evaluate for autoimmune pancreatitis, which was normal.  Also the CT scan appearance was not consistent with autoimmune pancreatitis.  Unfortunately he has been very slow to progress.  Past Medical History  Diagnosis Date  . Hypertension   . Hypercholesteremia     History reviewed. No pertinent past surgical history.  History reviewed. No pertinent family history.  Social History:  reports that he has never smoked. He does not have any smokeless tobacco history on file. He reports that he drinks alcohol. He reports that he does not use illicit drugs.  Allergies:  Allergies  Allergen Reactions  . Penicillins Rash    Medications:  Scheduled: . enoxaparin (LOVENOX) injection  40 mg Subcutaneous Q24H  . HYDROmorphone   Intravenous 6 times per day  . imipenem-cilastatin  500 mg Intravenous Q6H   Continuous: . dextrose 5 % and 0.9% NaCl 60 mL/hr at 01/13/16 0202  .  Marland KitchenTPN (CLINIMIX-E) Adult 83 mL/hr at 01/13/16 1744   And  . fat emulsion 240 mL (01/13/16 1744)  . Marland KitchenTPN (CLINIMIX-E) Adult     And  . fat emulsion      Results for orders placed or performed during the hospital encounter of 01/05/16 (from the past 24 hour(s))  Comprehensive metabolic panel     Status: Abnormal   Collection Time: 01/14/16  4:34 AM  Result Value Ref Range   Sodium 134 (L) 135 - 145 mmol/L   Potassium 3.9 3.5 - 5.1 mmol/L   Chloride 98 (L) 101 - 111 mmol/L   CO2 28 22 - 32 mmol/L   Glucose, Bld 129 (H) 65 - 99 mg/dL   BUN 9 6 - 20 mg/dL   Creatinine, Ser 1.30 0.61 - 1.24 mg/dL   Calcium 8.7 (L) 8.9 - 10.3 mg/dL   Total Protein 6.7 6.5 - 8.1 g/dL   Albumin 2.7 (L) 3.5 - 5.0 g/dL   AST 71 (H) 15 - 41 U/L   ALT 94 (H) 17 - 63 U/L   Alkaline Phosphatase 127 (H) 38 - 126 U/L   Total Bilirubin 0.8 0.3 - 1.2 mg/dL   GFR calc non Af Amer >60 >60 mL/min   GFR calc Af Amer >60 >60 mL/min   Anion gap 8 5 - 15  CBC     Status: Abnormal   Collection Time: 01/14/16  4:34 AM  Result Value Ref Range   WBC 16.0 (H) 4.0 - 10.5  K/uL   RBC 4.68 4.22 - 5.81 MIL/uL   Hemoglobin 12.4 (L) 13.0 - 17.0 g/dL   HCT 04.5 (L) 40.9 - 81.1 %   MCV 78.8 78.0 - 100.0 fL   MCH 26.5 26.0 - 34.0 pg   MCHC 33.6 30.0 - 36.0 g/dL   RDW 91.4 78.2 - 95.6 %   Platelets 457 (H) 150 - 400 K/uL     Dg Abd Portable 1v  01/13/2016  CLINICAL DATA:  40 year old male status post feeding tube placement. EXAM: PORTABLE ABDOMEN - 1 VIEW COMPARISON:  No priors. FINDINGS: Single supine view of the abdomen demonstrates the tip of the feeding tube in the antral pre-pyloric region of the stomach. Some gas is noted throughout visualized small bowel and colon. Several nondilated gas-filled loops of small bowel are noted in the lower abdomen. No definite pathologic dilatation of small bowel loops. No pneumoperitoneum. IMPRESSION: 1. Tip of feeding tube is in the antral pre-pyloric region of the stomach. Electronically  Signed   By: Trudie Reed M.D.   On: 01/13/2016 17:12    ROS:  As stated above in the HPI otherwise negative.  Blood pressure 131/84, pulse 91, temperature 98.2 F (36.8 C), temperature source Oral, resp. rate 16, height  (1.626 m), weight 84.7 kg (186 lb 11.7 oz), SpO2 100 %.    PE: Gen: NAD, Alert and Oriented HEENT:  [place]/AT, EOMI Neck: Supple, no LAD Lungs: CTA Bilaterally CV: RRR without M/G/R ABM: Soft, left upper and mid abdominal tenderness, +BS Ext: No C/C/E, back pain  Assessment/Plan: 1) Gallstone pancreatitis. 2) Pancreatic necrosis. 3) ABM pain.   The patient has a gallstone pancreatitis and once he recovers he will require a cholecystectomy.  His admitting ALT in the setting of his gallstones is consistent with this diagnosis.  He has not progressed very quickly, but this is not uncommon.  There does not appear to be an abscess in the pancreas.  His WBC has increased, but this can also be expected with his pancreatitis.  No reports of any fever and he was emperically started on Imipenem.  Plan: 1) Continue with supportive care. 2) If pain markedly worsens and/or there is fever, a repeat CT scan will be required to evaluate for abscess. 3) Okay to keep on Imipenem for now.   4) Agree with TPN, but enteric feeding will be better if the patient can tolerate.  Drew Prince D 01/14/2016, 5:37 PM

## 2016-01-14 NOTE — Care Management Note (Signed)
Case Management Note  Patient Details  Name: Drew Prince MRN: [id number] Date of Birth: April 25, 1976  Subjective/Objective:                    Action/Plan: Patient on TPN and IV PCA. PT recommending outpatient therapy. CM continuing to follow for discharge needs.   Expected Discharge Date:                  Expected Discharge Plan:     In-House Referral:     Discharge planning Services     Post Acute Care Choice:    Choice offered to:     DME Arranged:    DME Agency:     HH Arranged:    HH Agency:     Status of Service:  In process, will continue to follow  Medicare Important Message Given:    Date Medicare IM Given:    Medicare IM give by:    Date Additional Medicare IM Given:    Additional Medicare Important Message give by:     If discussed at Long Length of Stay Meetings, dates discussed:    Additional Comments:  Kermit Balo, RN 01/14/2016, 4:20 PM

## 2016-01-14 NOTE — Progress Notes (Signed)
PARENTERAL NUTRITION CONSULT NOTE   Pharmacy Consult for TPN Indication: severe pancreatitis and ileus  Allergies  Allergen Reactions  . Penicillins Rash   Patient Measurements: Height:  (162.6 cm) Weight: 186 lb 11.7 oz (84.7 kg) IBW/kg (Calculated) : 59.2  Vital Signs: Temp: 98.2 F (36.8 C) (01/17 0613) Temp Source: Oral (01/17 1610) BP: 129/79 mmHg (01/17 9604) Pulse Rate: 91 (01/17 5409)   Admit: 40 yo M came in with stomach pain and N/V. Found to have gallstone pancreatitis and ileus.  Pharmacy consulted to dose TPN for nutrition support.  Nutritional Goals: per RD 1/13 Kcal: 1900-2100 Protein: 95-110 grams Fluid: 1.9-2.1 L  Current Nutrition:  NPO + ice chips TPN @ 3ml/hr + IVFE @ 19ml/hr.  This provides 100 gm protein and 1900 kcals to meet 100% of goals.  D5NS @ 48ml/hr.  This provides 245 kcal  Insulin Requirements in the past 24 hours:  None  Surgeries/Procedures: Plan for cholecystectomy on another visit once more stable  GI: Gallstone pancreatitis, for cholecystectomy once pancreatitis is better; ileus. 1/13 CT abd:  Significant increase in peripancreatic & RP fluid - c/w infarction or early necrosis. 1/14 prealbumin 7.6 - low due to inflammatory process of pancreatitis. Albumin low at 2.7. Last BM was 1/9. May try clear liquids soon. NG tube places on 1/17 but started gagging and said he felt miserable. Then started to vomit and meds did not help so NG tube pulled out. GI is now consulted about whether or not they should trial post pyloric TFs.  Endo: no hx DM, all CBGs < 150 w/ TPN at goal, SSI and CBGs stopped.  Lytes: Lytes wnl and stable. CoCa 9.4. Phos and Mg ok.  Renal: SCr wnl, CrCl > 118ml/min. UOP is ok at 0.26ml/kg/hr yesterday. D5NS at 60 ml/hr  Pulm: On 3L of [place]  Cards: BP ok and HR 90s  Hepatobil: PMH HLD, on fenofibrate and atorvastatin PTA. AST slightly elevated at 53, ALT 87. TBili ok at 0.7. Triglycerides are down to 183 from  236.  Neuro: dilaudid PCA  ID: Day #5 of abx for necrosis with acute pancreatitis. The new CT showed evidence of necrosis in tail of pancreas. Tmax in last 24 hrs is 99. WBC stable at 16.  1/13 Imipenem >>  Best Practices: enoxaparin  TPN Access:  PICC placed 1/13  TPN start date: 1/13 >>  Plan:  Continue Clinimix E5/15 at 83 ml/hr Continue IVFE at 10 ml/hr Continue D5NS at 60 ml/hr to keep total IVF ~150 ml/hr This provides 100g of protein and 2139 kcal which meets 100% of patient needs Continue MVI and trace elements in TPN Monitor TPN labs F/U trial of clear liquids soon or GI consult for possible trial of post pyloric TFs  Enzo Bi, PharmD, BCPS Clinical Pharmacist Pager [phone] 01/14/2016 8:11 AM

## 2016-01-14 NOTE — Progress Notes (Signed)
Nutrition Follow-up  DOCUMENTATION CODES:   Obesity unspecified  INTERVENTION:   -TPN management per pharmacy -If post-pyloric feeds are recommended by GI, recommend" Initiate Osmolite 1.5 @ 20 ml/hr via post-pyloric tuve and increase by 10 ml every 4 hours to goal rate of 55 ml/hr.   30 ml Prostat daily.    Tube feeding regimen provides 2080 kcal (100% of needs), 98 grams of protein, and 1006 ml of H2O.   NUTRITION DIAGNOSIS:   Inadequate oral intake related to altered GI function as evidenced by NPO status.  Ongoing  GOAL:   Patient will meet greater than or equal to 90% of their needs  Met with TPN  MONITOR:   Diet advancement, Labs, Weight trends, Skin, I & O's  REASON FOR ASSESSMENT:   Consult Enteral/tube feeding initiation and management  ASSESSMENT:   40 year old Asian man presented to the emergency department and was admitted with a diagnosis of acute pancreatitis. He has had diffuse abdominal pain with associated nausea and vomiting for about 1 day. Otherwise has been in his usual state of health. Negative alcohol history. No new prescription or nonprescription medications.   TPN initiated on 01/10/16. TPN currently infusing via PICC- Clinimix E5/15 at 83 ml/hr and IVFE at 10 ml/hr (which provides 100g of protein and 2139 kcal which meets 100% of patient needs).   [place] tube team was consulted to place post-pyloric tube to initiate enteral feedings on 01/13/16, however, tube was pulled, due to pt experiencing nausea. Surgical team is consulting GI for potential to start post-pyloric feeds in the future. Pt was just started on clear liquids earlier this AM.   Per MD notes, plan for cholecystectomy once inflammation subsides.   Labs reviewed: Na: 134. Mg, K, and Phos WDL.   Diet Order:  TPN (CLINIMIX-E) Adult TPN (CLINIMIX-E) Adult Diet clear liquid Room service appropriate?: Yes; Fluid consistency:: Thin  Skin:  Reviewed, no issues  Last BM:   01/08/16  Height:   Ht Readings from Last 1 Encounters:  01/10/16 '5\' 4"'$  (1.626 m)    Weight:   Wt Readings from Last 1 Encounters:  01/10/16 186 lb 11.7 oz (84.7 kg)    Ideal Body Weight:  59.1 kg  BMI:  Body mass index is 32.04 kg/(m^2).  Estimated Nutritional Needs:   Kcal:  1900-2100  Protein:  95-110 grams  Fluid:  1.9-2.1 L  EDUCATION NEEDS:   No education needs identified at this time  Drew Prince, RD, LDN, CDE Pager: [phone] After hours Pager: [phone]

## 2016-01-15 DIAGNOSIS — D72829 Elevated white blood cell count, unspecified: Secondary | ICD-10-CM

## 2016-01-15 DIAGNOSIS — D696 Thrombocytopenia, unspecified: Secondary | ICD-10-CM

## 2016-01-15 DIAGNOSIS — Z205 Contact with and (suspected) exposure to viral hepatitis: Secondary | ICD-10-CM

## 2016-01-15 DIAGNOSIS — R748 Abnormal levels of other serum enzymes: Secondary | ICD-10-CM

## 2016-01-15 LAB — COMPREHENSIVE METABOLIC PANEL
ALK PHOS: 151 U/L — AB (ref 38–126)
ALT: 115 U/L — ABNORMAL HIGH (ref 17–63)
ANION GAP: 10 (ref 5–15)
AST: 75 U/L — ABNORMAL HIGH (ref 15–41)
Albumin: 2.8 g/dL — ABNORMAL LOW (ref 3.5–5.0)
BILIRUBIN TOTAL: 0.5 mg/dL (ref 0.3–1.2)
BUN: 10 mg/dL (ref 6–20)
CALCIUM: 9.2 mg/dL (ref 8.9–10.3)
CO2: 28 mmol/L (ref 22–32)
Chloride: 97 mmol/L — ABNORMAL LOW (ref 101–111)
Creatinine, Ser: 0.7 mg/dL (ref 0.61–1.24)
GFR calc non Af Amer: >60 mL/min (ref >60)
GLUCOSE: 136 mg/dL — AB (ref 65–99)
Potassium: 3.9 mmol/L (ref 3.5–5.1)
Sodium: 135 mmol/L (ref 135–145)
TOTAL PROTEIN: 7.2 g/dL (ref 6.5–8.1)

## 2016-01-15 LAB — HEPATITIS B SURFACE ANTIGEN: HEP B S AG: NEGATIVE

## 2016-01-15 LAB — CBC
HCT: 39.5 % (ref 39.0–52.0)
HEMOGLOBIN: 13.2 g/dL (ref 13.0–17.0)
MCH: 26.5 pg (ref 26.0–34.0)
MCHC: 33.4 g/dL (ref 30.0–36.0)
MCV: 79.2 fL (ref 78.0–100.0)
Platelets: 552 10*3/uL — ABNORMAL HIGH (ref 150–400)
RBC: 4.99 MIL/uL (ref 4.22–5.81)
RDW: 13.8 % (ref 11.5–15.5)
WBC: 17.4 10*3/uL — ABNORMAL HIGH (ref 4.0–10.5)

## 2016-01-15 LAB — HCV COMMENT:

## 2016-01-15 LAB — HEPATITIS C ANTIBODY (REFLEX): HCV Ab: 0.1 s/co ratio (ref 0.0–0.9)

## 2016-01-15 LAB — HEPATITIS A ANTIBODY, TOTAL: HEP A TOTAL AB: POSITIVE — AB

## 2016-01-15 LAB — HEPATITIS B CORE ANTIBODY, TOTAL: Hep B Core Total Ab: NEGATIVE

## 2016-01-15 LAB — HEPATITIS B SURFACE ANTIBODY,QUALITATIVE: Hep B S Ab: REACTIVE

## 2016-01-15 MED ORDER — FAT EMULSION 20 % IV EMUL
240.0000 mL | INTRAVENOUS | Status: AC
  Administered 2016-01-15: 240 mL via INTRAVENOUS
  Filled 2016-01-15: qty 250

## 2016-01-15 MED ORDER — TRACE MINERALS CR-CU-MN-SE-ZN 10-1000-500-60 MCG/ML IV SOLN
INTRAVENOUS | Status: AC
  Administered 2016-01-15: 18:00:00 via INTRAVENOUS
  Filled 2016-01-15: qty 1992

## 2016-01-15 NOTE — Progress Notes (Signed)
Pharmacy Antibiotic Follow-up Note  Drew Prince is a 40 y.o. year-old male admitted on 01/05/2016.  The patient is currently on day 6 of Primaxin  for pancreatitis with necrosis.  Assessment/Plan: Continue Primaxin 500 mg iv Q 6 hours Please consider adding a stop date or discontinuing    Temp (24hrs), Avg:98.1 F (36.7 C), Min:97.8 F (36.6 C), Max:98.4 F (36.9 C)   Recent Labs Lab 01/11/16 0505 01/12/16 0451 01/13/16 0601 01/14/16 0434 01/15/16 0445  WBC 13.4* 13.4* 14.6* 16.0* 17.4*    Recent Labs Lab 01/11/16 0505 01/12/16 0451 01/13/16 0601 01/14/16 0434 01/15/16 0445  CREATININE 0.58* 0.64 0.67 0.71 0.70   Estimated Creatinine Clearance: 120.5 mL/min (by C-G formula based on Cr of 0.7).    Allergies  Allergen Reactions  . Penicillins Rash    Thank you for allowing pharmacy to be a part of this patient's care. Okey Regal, PharmD [phone]  01/15/2016 9:46 AM

## 2016-01-15 NOTE — Progress Notes (Addendum)
Patient ID: Drew Prince, male   DOB: Jul 15, 1976, 40 y.o.   MRN: [id number]   Subjective: Drew Prince feels the same today as yesterday, except for slightly tender are in his right lower quadrant. He tried taking fluids yesterday. He was able to keep water down, but felt nauseated when tasting the sugary juice drinks. He has a nice conversation with the gastroenterologist yesterday.  Objective: Vital signs in last 24 hours: Filed Vitals:   01/15/16 0037 01/15/16 0206 01/15/16 0546 01/15/16 0800  BP:  126/86 124/77   Pulse:  92 87   Temp:  98.4 F (36.9 C) 97.8 F (36.6 C)   TempSrc:  Oral Oral   Resp: _0 Height:      Weight:      SpO2: 100% 98% 96% 96%   Physical exam: General: Drew Prince man resting in bed pretty uncomfortable, appropriately conversational Abd: non-distended, with some stable ecchymoses in the left lower flank, mostly tender in the left lower quadrant without rebound or guarding Ext: warm and well perfused, without pedal edema   Lab Results: Basic Metabolic Panel:  Recent Labs Lab 01/12/16 0451 01/13/16 0601 01/14/16 0434 01/15/16 0445  NA 136 134* 134* 135  K 3.5 3.8 3.9 3.9  CL 99* 99* 98* 97*  CO2 _1 GLUCOSE 122* 132* 129* 136*  BUN _2 CREATININE 0.64 0.67 0.71 0.70  CALCIUM 8.5* 8.5* 8.7* 9.2  MG 1.9 2.0  --   --   PHOS 2.8 3.5  --   --    Liver Function Tests:  Recent Labs Lab 01/14/16 0434 01/15/16 0445  AST 71* 75*  ALT 94* 115*  ALKPHOS 127* 151*  BILITOT 0.8 0.5  PROT 6.7 7.2  ALBUMIN 2.7* 2.8*   CBC:  Recent Labs Lab 01/14/16 0434 01/15/16 0445  WBC 16.0* 17.4*  HGB 12.4* 13.2  HCT 36.9* 39.5  MCV 78.8 79.2  PLT 457* 552*   Medications: I have reviewed the patient's current medications. Scheduled Meds: . enoxaparin (LOVENOX) injection  40 mg Subcutaneous Q24H  . HYDROmorphone   Intravenous 6 times per day  . imipenem-cilastatin  500 mg Intravenous Q6H   Continuous Infusions: .  dextrose 5 % and 0.9% NaCl 60 mL/hr at 01/13/16 0202  . [place] KitchenTPN (CLINIMIX-E) Adult 83 mL/hr at 01/14/16 1819   And  . fat emulsion 240 mL (01/14/16 1820)   PRN Meds:.diphenhydrAMINE **OR** diphenhydrAMINE, naloxone **AND** sodium chloride, ondansetron (ZOFRAN) IV, sodium chloride   Assessment/Plan:  [name] is here with acute pancreatitis with necrosis; this is now hospital day 10 for him, and day 6 of imipenem-cilastatin. Clinically, he has hardly made any progress. Gastroenterology saw him yesterday and thinks this was gallstone pancreatitis and recommended to hold the course. Overnight, his leukocytosis continues to slowly ticupward, now to 17, thrombocytosis is increasing up to 550, and his alkaline phosphatase is now also trending upward slightly to 150. I'm getting concerned he could be developing a pseudocyst or perhaps worsening necrosis given his Turner's sign on his left flank. I don't think he has acute cholangitis but we have to consider some sort of biliary stricture as well given his increasing alk-phos. We can consider an MRCP tomorrow. We'll also keep a very low threshold to get another abdominal CT scan to look for pseudocyst or worsening necrosis. If his pain worsens, or he spikes another fever, we will re-scan. Otherwise, we'll hold the course.  Acute pancreatitis: Per above. -  Thanks gastroenterology for your help -Continue TPN -Continue D5 NS at 60 cc/hr -Continue PCA for pain control -Continue imipenem-cilastin 500 mg IV Q6H -Holding home medications -Will need outpatient cholecystectomy once improved  Hepatocellular transaminitis: He has had persistent mild transaminitis that I think is most likely from nonalcoholic fatty liver disease. He is vaccinated against hepatitis A and B, and does not have hepatitis C.  Dispo: Disposition is deferred at this time, awaiting improvement of current medical problems.  The patient does have a current PCP ([place]) and does need an [facility] hospital follow-up appointment after discharge.  The patient does not know have transportation limitations that hinder transportation to clinic appointments.  .Services Needed at time of discharge: Y = Yes, Blank = No PT:   OT:   RN:   Equipment:   Other:     LOS: 10 days   Loleta Chance, MD 01/15/2016, 8:38 AM

## 2016-01-15 NOTE — Progress Notes (Signed)
Cleared 4.5 mg of hydromorphone PCA at 0427 pt still c/o LLQ abdominal pain rated 6/10, O2 SAT 100 % with 3 l /min [place] ETCO2 41  Res 14 bpm pulse 93 bpm . No acute respiratory distress.

## 2016-01-15 NOTE — Progress Notes (Signed)
UNASSIGNED PATIENT Subjective:  Patient complains of ongoing abdominal pain and nausea, worse when he attempts to eat. He is receiving TNA as the postpyloric feeding tube was removed due to patient discomfort. Is no fever chills or rigors he has been given a full liquid diet at his request  Objective: Vital signs in last 24 hours: Temp:  [97.8 F (36.6 C)-98.4 F (36.9 C)] 98 F (36.7 C) (01/18 1410) Pulse Rate:  [87-95] 87 (01/18 1410) Resp:  [16-21] 16 (01/18 1410) BP: (124-137)/(77-88) 136/77 mmHg (01/18 1410) SpO2:  [96 %-100 %] 100 % (01/18 1410) FiO2 (%):  [0 %] 0 % (01/18 1128) Last BM Date: 01/06/16  Intake/Output from previous day: 01/17 0701 - 01/18 0700 In: 3306 [P.O.:1080; I.V.:10; IV Piggyback:1100; TPN:1116] Out: 1600 [Urine:1600] Intake/Output this shift:   General appearance: alert, cooperative, moderate distress and pale Resp: clear to auscultation bilaterally Cardio: regular rate and rhythm, S1, S2 normal, no murmur, click, rub or gallop GI: soft, diffusely tender on palpation with gaurding; bowel sounds hypoactive; no masses,  no organomegaly Extremities: extremities normal, atraumatic, no cyanosis or edema  Lab Results:  Recent Labs  01/13/16 0601 01/14/16 0434 01/15/16 0445  WBC 14.6* 16.0* 17.4*  HGB 12.3* 12.4* 13.2  HCT 36.6* 36.9* 39.5  PLT 480* 457* 552*   BMET  Recent Labs  01/13/16 0601 01/14/16 0434 01/15/16 0445  NA 134* 134* 135  K 3.8 3.9 3.9  CL 99* 98* 97*  CO2 GLUCOSE 132* 129* 136*  BUN CREATININE 0.67 0.71 0.70  CALCIUM 8.5* 8.7* 9.2   LFT  Recent Labs  01/15/16 0445  PROT 7.2  ALBUMIN 2.8*  AST 75*  ALT 115*  ALKPHOS 151*  BILITOT 0.5   PT/INR No results for input(s): LABPROT, INR in the last 72 hours. Hepatitis Panel  Recent Labs  01/14/16 1230  HEPBSAG Negative   Studies/Results: Dg Abd Portable 1v  01/13/2016  CLINICAL DATA:  40 year old male status post feeding tube placement.  EXAM: PORTABLE ABDOMEN - 1 VIEW COMPARISON:  No priors. FINDINGS: Single supine view of the abdomen demonstrates the tip of the feeding tube in the antral pre-pyloric region of the stomach. Some gas is noted throughout visualized small bowel and colon. Several nondilated gas-filled loops of small bowel are noted in the lower abdomen. No definite pathologic dilatation of small bowel loops. No pneumoperitoneum. IMPRESSION: 1. Tip of feeding tube is in the antral pre-pyloric region of the stomach. Electronically Signed   By: Trudie Reed M.D.   On: 01/13/2016 17:12    Medications: I have reviewed the patient's current medications.  Assessment/Plan: Acute gallstone pancreatitis with pancreatic necrosis and ascites: continue supportive care.  LOS: 10 days   Drew Prince 01/15/2016, 2:17 PM

## 2016-01-15 NOTE — Progress Notes (Signed)
PARENTERAL NUTRITION CONSULT NOTE   Pharmacy Consult for TPN Indication: severe pancreatitis and ileus  Allergies  Allergen Reactions  . Penicillins Rash   Patient Measurements: Height:  (162.6 cm) Weight: 186 lb 11.7 oz (84.7 kg) IBW/kg (Calculated) : 59.2  Vital Signs: Temp: 98.2 F (36.8 C) (01/18 0917) Temp Source: Oral (01/18 0917) BP: 134/86 mmHg (01/18 0917) Pulse Rate: 93 (01/18 0917)   Admit: 40 yo M came in with stomach pain and N/V. Found to have gallstone pancreatitis and ileus.  Pharmacy consulted to dose TPN for nutrition support.  Nutritional Goals: per RD 1/17 Kcal: 1900-2100 Protein: 95-110 grams Fluid: 1.9-2.1 L  Current Nutrition:  NPO + ice chips TPN @ 36ml/hr + IVFE @ 66ml/hr.  This provides 100 gm protein and 1900 kcals to meet 100% of goals.  D5NS @ 40ml/hr.  This provides 245 kcal  Insulin Requirements in the past 24 hours:  None  Surgeries/Procedures: Plan for cholecystectomy on another visit once more stable  GI: Gallstone pancreatitis, for cholecystectomy once pancreatitis is better; ileus. 1/13 CT abd:  Significant increase in peripancreatic & RP fluid - c/w infarction or early necrosis. 1/14 prealbumin 7.6 - low due to inflammatory process of pancreatitis. Albumin low at 2.7. Last BM was 1/9. NG tube places on 1/17 but started gagging and said he felt miserable. Then started to vomit and meds did not help so NG tube pulled out. GI states that enteric feeding would be better if patient can tolerate. RD had come by and recommended a enteral feeding regimen if started.  Endo: no hx DM, all CBGs < 150 w/ TPN at goal, SSI and CBGs stopped.  Lytes: Lytes wnl and stable. CoCa 9.9. Phos and Mg ok on 1/16  Renal: SCr wnl, CrCl > 124ml/min. UOP is ok at 0.106ml/kg/hr yesterday. D5NS at 60 ml/hr  Pulm: On 3L of [place]  Cards: BP ok and HR 90s  Hepatobil: PMH HLD, on fenofibrate and atorvastatin PTA. AST slightly elevated at 53, ALT 87. TBili ok at  0.7. Triglycerides are down to 183 from 236.  Neuro: Patient still complains of LLQ abdominal pain. dilaudid PCA  ID: Day #6 of abx for necrosis with acute pancreatitis. The new CT showed evidence of necrosis in tail of pancreas. Tmax in last 24 hrs is 99. WBC stable at 17.4  1/13 Imipenem >>  Best Practices: enoxaparin  TPN Access:  PICC placed 1/13  TPN start date: 1/13 >>  Plan:  Continue Clinimix E5/15 at 83 ml/hr Continue IVFE at 10 ml/hr Continue D5NS at 60 ml/hr to keep total IVF ~150 ml/hr This provides 100g of protein and 2139 kcal which meets 100% of patient needs Continue MVI and trace elements in TPN Monitor TPN labs F/U trial of possible post pyloric TFs and weaning of TPN  Enzo Bi, PharmD, [facility] Clinical Pharmacist Pager [phone] 01/15/2016 9:27 AM

## 2016-01-16 ENCOUNTER — Inpatient Hospital Stay (HOSPITAL_COMMUNITY): Payer: BLUE CROSS/BLUE SHIELD

## 2016-01-16 LAB — PHOSPHORUS: Phosphorus: 4.6 mg/dL (ref 2.5–4.6)

## 2016-01-16 LAB — CBC
HEMATOCRIT: 38.3 % — AB (ref 39.0–52.0)
HEMOGLOBIN: 13.1 g/dL (ref 13.0–17.0)
MCH: 27 pg (ref 26.0–34.0)
MCHC: 34.2 g/dL (ref 30.0–36.0)
MCV: 79 fL (ref 78.0–100.0)
Platelets: 564 10*3/uL — ABNORMAL HIGH (ref 150–400)
RBC: 4.85 MIL/uL (ref 4.22–5.81)
RDW: 13.5 % (ref 11.5–15.5)
WBC: 15.2 10*3/uL — ABNORMAL HIGH (ref 4.0–10.5)

## 2016-01-16 LAB — COMPREHENSIVE METABOLIC PANEL
ALBUMIN: 3 g/dL — AB (ref 3.5–5.0)
ALK PHOS: 147 U/L — AB (ref 38–126)
ALT: 131 U/L — ABNORMAL HIGH (ref 17–63)
ANION GAP: 7 (ref 5–15)
AST: 75 U/L — AB (ref 15–41)
BUN: 11 mg/dL (ref 6–20)
CO2: 29 mmol/L (ref 22–32)
Calcium: 9 mg/dL (ref 8.9–10.3)
Chloride: 99 mmol/L — ABNORMAL LOW (ref 101–111)
Creatinine, Ser: 0.64 mg/dL (ref 0.61–1.24)
GFR calc Af Amer: >60 mL/min (ref >60)
GFR calc non Af Amer: >60 mL/min (ref >60)
GLUCOSE: 143 mg/dL — AB (ref 65–99)
POTASSIUM: 4.1 mmol/L (ref 3.5–5.1)
SODIUM: 135 mmol/L (ref 135–145)
Total Bilirubin: 0.7 mg/dL (ref 0.3–1.2)
Total Protein: 7.1 g/dL (ref 6.5–8.1)

## 2016-01-16 LAB — MAGNESIUM: MAGNESIUM: 2.1 mg/dL (ref 1.7–2.4)

## 2016-01-16 MED ORDER — FAT EMULSION 20 % IV EMUL
240.0000 mL | INTRAVENOUS | Status: AC
  Administered 2016-01-16: 240 mL via INTRAVENOUS
  Filled 2016-01-16: qty 250

## 2016-01-16 MED ORDER — FLUCONAZOLE IN SODIUM CHLORIDE 200-0.9 MG/100ML-% IV SOLN
200.0000 mg | INTRAVENOUS | Status: DC
  Administered 2016-01-16 – 2016-01-25 (×10): 200 mg via INTRAVENOUS
  Filled 2016-01-16 (×13): qty 100

## 2016-01-16 MED ORDER — GADOBENATE DIMEGLUMINE 529 MG/ML IV SOLN
16.0000 mL | Freq: Once | INTRAVENOUS | Status: AC | PRN
  Administered 2016-01-16: 16 mL via INTRAVENOUS

## 2016-01-16 MED ORDER — TRACE MINERALS CR-CU-MN-SE-ZN 10-1000-500-60 MCG/ML IV SOLN
INTRAVENOUS | Status: AC
  Administered 2016-01-16: 17:00:00 via INTRAVENOUS
  Filled 2016-01-16: qty 1992

## 2016-01-16 NOTE — Progress Notes (Signed)
Subjective: No acute events.  Some pain with PO intake.  Objective: Vital signs in last 24 hours: Temp:  [98 F (36.7 C)-98.3 F (36.8 C)] 98.3 F (36.8 C) (01/19 0526) Pulse Rate:  [87-94] 93 (01/19 0526) Resp:  [13-20] 20 (01/19 0526) BP: (121-152)/(77-86) 152/84 mmHg (01/19 0526) SpO2:  [95 %-100 %] 100 % (01/19 0526) FiO2 (%):  [0 %] 0 % (01/18 1545) Last BM Date: 01/06/16  Intake/Output from previous day: 01/18 0701 - 01/19 0700 In: 1621 [P.O.:120; I.V.:600; IV Piggyback:200; TPN:701] Out: 1400 [Urine:1400] Intake/Output this shift:    General appearance: alert and no distress GI: soft, non-tender; bowel sounds normal; no masses,  no organomegaly  Lab Results:  Recent Labs  01/14/16 0434 01/15/16 0445  WBC 16.0* 17.4*  HGB 12.4* 13.2  HCT 36.9* 39.5  PLT 457* 552*   BMET  Recent Labs  01/14/16 0434 01/15/16 0445 01/16/16 0600  NA 134* 135 135  K 3.9 3.9 4.1  CL 98* 97* 99*  CO2 GLUCOSE 129* 136* 143*  BUN CREATININE 0.71 0.70 0.64  CALCIUM 8.7* 9.2 9.0   LFT  Recent Labs  01/16/16 0600  PROT 7.1  ALBUMIN 3.0*  AST 75*  ALT 131*  ALKPHOS 147*  BILITOT 0.7   PT/INR No results for input(s): LABPROT, INR in the last 72 hours. Hepatitis Panel  Recent Labs  01/14/16 1230  HEPBSAG Negative   C-Diff No results for input(s): CDIFFTOX in the last 72 hours. Fecal Lactopherrin No results for input(s): FECLLACTOFRN in the last 72 hours.  Studies/Results: No results found.  Medications:  Scheduled: . enoxaparin (LOVENOX) injection  40 mg Subcutaneous Q24H  . HYDROmorphone   Intravenous 6 times per day  . imipenem-cilastatin  500 mg Intravenous Q6H   Continuous: . dextrose 5 % and 0.9% NaCl 60 mL/hr at 01/13/16 0202  . Marland KitchenTPN (CLINIMIX-E) Adult 83 mL/hr at 01/15/16 1758   And  . fat emulsion 240 mL (01/15/16 1758)    Assessment/Plan: 1) Gallstone pancreatitis with pancreatic necrosis. 2) Leukocytosis.   He  reports abdominal pain with PO intake.  He needs to be NPO again.  PO intake can be tried in the near future if he desires, but this is not an issue that needs to be pursued aggressively since it induces pain.  Clinically he remains stable.  Most importantly he is afebrile.  The WBC is increasing, but the significance it uncertain at this time.  It is not unrealistic to assume that he may be in the hospital for another week or two.    Plan: 1) Continue with supportive care.  2) Continue with pain control. 3) NPO.  LOS: 11 days   Drew Prince D 01/16/2016, 7:35 AM

## 2016-01-16 NOTE — Progress Notes (Signed)
PARENTERAL NUTRITION CONSULT NOTE - FOLLOW UP  Pharmacy Consult for TPN Indication: Pancreatitis  Allergies  Allergen Reactions  . Penicillins Rash    Patient Measurements: Height:  (162.6 cm) Weight: 186 lb 11.7 oz (84.7 kg) IBW/kg (Calculated) : 59.2 Adjusted Body Weight:  Usual Weight:   Vital Signs: Temp: 98.3 F (36.8 C) (01/19 0526) Temp Source: Oral (01/19 0526) BP: 152/84 mmHg (01/19 0526) Pulse Rate: 93 (01/19 0526) Intake/Output from previous day: 01/18 0701 - 01/19 0700 In: 1621 [P.O.:120; I.V.:600; IV Piggyback:200; TPN:701] Out: 1400 [Urine:1400] Intake/Output from this shift:    Labs:  Recent Labs  01/14/16 0434 01/15/16 0445  WBC 16.0* 17.4*  HGB 12.4* 13.2  HCT 36.9* 39.5  PLT 457* 552*     Recent Labs  01/14/16 0434 01/15/16 0445 01/16/16 0600  NA 134* 135 135  K 3.9 3.9 4.1  CL 98* 97* 99*  CO2 GLUCOSE 129* 136* 143*  BUN CREATININE 0.71 0.70 0.64  CALCIUM 8.7* 9.2 9.0  MG  --   --  2.1  PHOS  --   --  4.6  PROT 6.7 7.2 7.1  ALBUMIN 2.7* 2.8* 3.0*  AST 71* 75* 75*  ALT 94* 115* 131*  ALKPHOS 127* 151* 147*  BILITOT 0.8 0.5 0.7   Estimated Creatinine Clearance: 120.5 mL/min (by C-G formula based on Cr of 0.64).    Recent Labs  01/14/16 2315  GLUCAP 119*    Medications:  Scheduled:  . enoxaparin (LOVENOX) injection  40 mg Subcutaneous Q24H  . HYDROmorphone   Intravenous 6 times per day  . imipenem-cilastatin  500 mg Intravenous Q6H    Admit: 40 yo M came in with stomach pain and N/V. Found to have gallstone pancreatitis and ileus. Pharmacy consulted to dose TPN for nutrition support.  Nutritional Goals: per RD 1/17 Kcal: 1900-2100 Protein: 95-110 grams Fluid: 1.9-2.1 L  Current Nutrition:  NPO + ice chips TPN @ 94ml/hr + IVFE @ 80ml/hr. This provides 100 gm protein and 1900 kcals to meet 100% of goals.  D5NS @ 37ml/hr. This provides 245 kcal  Insulin Requirements in the past 24  hours:  None  Surgeries/Procedures: Plan for cholecystectomy on another visit once more stable  GI:  Gallstone pancreatitis, for cholecystectomy once pancreatitis is better; ileus.  1/14 prealbumin 7.6 - low due to inflammatory process of pancreatitis. Albumin 3- improving over last week. LBM 1/9. Failed NGT placement 1/17 2nd N/V, & was pulled.  (+)abd pain with po intake, changed to NPO again.  RD had come by and recommended a enteral feeding regimen if started.    1/13:  CT (+)sig inc peripancreatic & RP fluid - c/w infarction or early necrosis.  Endo:  no hx DM, all CBGs < 150 w/ TPN at goal, SSI and CBGs stopped.  Lytes:  Lytes wnl and stable. CoCa 9.8. Phos and Mg ok today.  Renal:  SCr wnl, CrCl > 128ml/min. UOP is ok at 0.61ml/kg/hr yesterday. D5NS at 60 ml/hr  Pulm:  [place]-3L  Cards: BP ok and HR 90s  Hepatobil:  PMH HLD, on fenofibrate and atorvastatin PTA.  AST slightly elevated at 53, ALT 87. TBili ok at 0.7. Triglycerides are down to 183 from 236.  Neuro: Patient still complains of LLQ abdominal pain. Dilaudid PCA, /24hr.  ID:  Primaxin for necrosis with acute pancreatitis. The new CT showed evidence of necrosis in tail of pancreas. AFeb in last 24hr. WBC increased at  17.4 on 1/18.  1/13 Imipenem >>  Best Practices: enoxaparin  TPN Access: PICC placed 1/13  TPN start date: 1/13 >>  Plan:  Continue Clinimix E-5/15 at 83 ml/hr Continue IVFE at 10 ml/hr Continue D5NS at 60 ml/hr to keep total IVF ~150 ml/hr Continue MVI and trace elements in TPN Monitor TPN labs    Marisue Humble, PharmD Clinical Pharmacist [facility]

## 2016-01-16 NOTE — Progress Notes (Signed)
Physical Therapy Treatment Patient Details Name: Kristina Bertone MRN: [id number] DOB: 1976/03/28 Today's Date: 01/16/2016    History of Present Illness Pt is a 40 y/o male who presents with acute moderate pancreatitis.     PT Comments    Pt progressing towards physical therapy goals. Was able to ambulate with 1 UE support for safety (pt comfort). HEP provided and reviewed with pt. He was able to demonstrate good technique with seated exercise, and supine exercise was reviewed as well. Encouraged pt to perform 2x/day depending on tolerance. Will continue to follow and progress as able per POC.   Follow Up Recommendations  No PT follow up;Supervision - Intermittent     Equipment Recommendations  None recommended by PT    Recommendations for Other Services       Precautions / Restrictions Precautions Precautions: Fall Restrictions Weight Bearing Restrictions: No    Mobility  Bed Mobility Overal bed mobility: Modified Independent             General bed mobility comments: Increased time required due to pain. Pt was instructed in log roll for abdominal comfort.  Transfers Overall transfer level: Modified independent Equipment used: None Transfers: Sit to/from Stand              Ambulation/Gait Ambulation/Gait assistance: Min guard Ambulation Distance (Feet): 125 Feet Assistive device: None (Held to IV pole - occasional railings in hall) Gait Pattern/deviations: Step-through pattern;Decreased stride length;Trunk flexed Gait velocity: Decreased Gait velocity interpretation: Below normal speed for age/gender General Gait Details: Use of wall rail intermittently for reassurance. 2 standing rest breaks due to fatigue and SOB. Pt on RA throughout gait training and sats dropped occasionally into mid 80's, which recovered into the 90's with short standing rest.   Stairs            Wheelchair Mobility    Modified Rankin (Stroke Patients Only)       Balance  Overall balance assessment: No apparent balance deficits (not formally assessed)                                  Cognition Arousal/Alertness: Awake/alert Behavior During Therapy: WFL for tasks assessed/performed Overall Cognitive Status: Within Functional Limits for tasks assessed                      Exercises General Exercises - Lower Extremity Ankle Circles/Pumps: 10 reps Quad Sets: 10 reps Long Arc Quad: 10 reps Hip ABduction/ADduction: 10 reps Other Exercises Other Exercises: Pt instructed in exercise routine (handout)    General Comments        Pertinent Vitals/Pain Pain Assessment: Faces Faces Pain Scale: Hurts little more Pain Location: Anterior abdomen Pain Descriptors / Indicators: Grimacing;Guarding Pain Intervention(s): Monitored during session;Limited activity within patient's tolerance;Repositioned    Home Living Family/patient expects to be discharged to:: Private residence Living Arrangements: Spouse/significant other Available Help at Discharge: Family;Available PRN/intermittently                Prior Function            PT Goals (current goals can now be found in the care plan section) Acute Rehab PT Goals Patient Stated Goal: Decrease pain PT Goal Formulation: With patient Time For Goal Achievement: 01/18/16 Potential to Achieve Goals: Good Progress towards PT goals: Progressing toward goals    Frequency  Min 3X/week    PT Plan Discharge plan needs to  be updated    Co-evaluation             End of Session Equipment Utilized During Treatment: Gait belt;Oxygen (Pt on RA throughout session, back to supplemental O2 at end) Activity Tolerance: Patient tolerated treatment well Patient left: in bed;with call bell/phone within reach     Time: 1346-1415 PT Time Calculation (min) (ACUTE ONLY): 29 min  Charges:  $Gait Training: 8-22 mins $Therapeutic Exercise: 8-22 mins                    G Codes:       Conni Slipper 2016-01-24, 2:27 PM  Conni Slipper, PT, DPT Acute Rehabilitation Services Pager: [phone]

## 2016-01-16 NOTE — Progress Notes (Signed)
Patient ID: Drew Prince, male   DOB: 07-03-1976, 40 y.o.   MRN: [id number]   Subjective: Drew Prince feels about the same today as yesterday. He felt nauseous when trying to drink sweetened juice drinks. He also mentioned an itchy rash on his buttocks.   Objective: Vital signs in last 24 hours: Filed Vitals:   01/16/16 0417 01/16/16 0526 01/16/16 0744 01/16/16 0745  BP:  152/84    Pulse:  93    Temp:  98.3 F (36.8 C)    TempSrc:  Oral    Resp: _0 Height:      Weight:      SpO2: 100% 100% 98% 98%   Physical exam: General: Drew Prince resting in bed pretty uncomfortable, appropriately conversational Abd: non-distended, with some stable ecchymoses in the left lower flank, mostly tender in the left lower quadrant without rebound or guarding Skin: numerous folliculocentric erythematous papules with subtle scale over buttocks extending over lateral thighs Ext: warm and well perfused, without pedal edema   Lab Results: Basic Metabolic Panel:  Recent Labs Lab 01/13/16 0601  01/15/16 0445 01/16/16 0600  NA 134*  < > 135 135  K 3.8  < > 3.9 4.1  CL 99*  < > 97* 99*  CO2 27  < > 28 29  GLUCOSE 132*  < > 136* 143*  BUN 8  < > 10 11  CREATININE 0.67  < > 0.70 0.64  CALCIUM 8.5*  < > 9.2 9.0  MG 2.0  --   --  2.1  PHOS 3.5  --   --  4.6  < > = values in this interval not displayed.   Liver Function Tests:  Recent Labs Lab 01/15/16 0445 01/16/16 0600  AST 75* 75*  ALT 115* 131*  ALKPHOS 151* 147*  BILITOT 0.5 0.7  PROT 7.2 7.1  ALBUMIN 2.8* 3.0*   CBC:  Recent Labs Lab 01/15/16 0445 01/16/16 1035  WBC 17.4* 15.2*  HGB 13.2 13.1  HCT 39.5 38.3*  MCV 79.2 79.0  PLT 552* 564*   Medications: I have reviewed the patient's current medications. Scheduled Meds: . enoxaparin (LOVENOX) injection  40 mg Subcutaneous Q24H  . HYDROmorphone   Intravenous 6 times per day  . imipenem-cilastatin  500 mg Intravenous Q6H   Continuous Infusions: . dextrose 5  % and 0.9% NaCl 60 mL/hr at 01/13/16 0202  . Marland KitchenTPN (CLINIMIX-E) Adult 83 mL/hr at 01/15/16 1758   And  . fat emulsion 240 mL (01/15/16 1758)   PRN Meds:.diphenhydrAMINE **OR** diphenhydrAMINE, naloxone **AND** sodium chloride, ondansetron (ZOFRAN) IV, sodium chloride   Assessment/Plan:  Acute pancreatitis: He continues to make minimal clinical improvement. Since he is taking so long to improve, and his Alk-P has been creeping upward, we will evaluate his biliary anatomy with an MRCP today to see if there is some sort of stricture. His leukocytosis is coming down a touch today, and his LFTs remained stable. If he remains afebrile tomorrow, we will likely stop the imipenem.  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 A KOH didn't show hyphae but this isn't surprising as Majocchi's granuloma is a dermal fungal infection and there are often no funga on the stratum corneum. I doubt this is bacterial folliculitis as he's been  on imipenem. I'll start IV fluconazole today as he can't take pills. Unfortunately topical antifungals aren't effective because the infection is deep in the dermis. -Started IV fluconazole 256m daily for 5 days (stop date 1/24) -Ambulate every shift  Dispo: Disposition is deferred at this time, awaiting improvement of current medical problems.   The patient does have a [place] [facility] [facility]  The patient does not know have transportation limitations that hinder transportation to clinic appointments.  .Services Needed at time of discharge: Y = Yes, Blank = No PT:   OT:   RN:   Equipment:   Other:      LOS: 11 days   KLoleta Chance MD 01/16/2016, 8:37 AM

## 2016-01-17 DIAGNOSIS — K863 Pseudocyst of pancreas: Secondary | ICD-10-CM | POA: Insufficient documentation

## 2016-01-17 DIAGNOSIS — B358 Other dermatophytoses: Secondary | ICD-10-CM

## 2016-01-17 LAB — CBC
HEMATOCRIT: 41.2 % (ref 39.0–52.0)
HEMOGLOBIN: 13.8 g/dL (ref 13.0–17.0)
MCH: 26.6 pg (ref 26.0–34.0)
MCHC: 33.5 g/dL (ref 30.0–36.0)
MCV: 79.4 fL (ref 78.0–100.0)
PLATELETS: 637 10*3/uL — AB (ref 150–400)
RBC: 5.19 MIL/uL (ref 4.22–5.81)
RDW: 13.7 % (ref 11.5–15.5)
WBC: 15.8 10*3/uL — AB (ref 4.0–10.5)

## 2016-01-17 MED ORDER — HYDROMORPHONE 1 MG/ML IV SOLN
INTRAVENOUS | Status: DC
Start: 2016-01-17 — End: 2016-01-22
  Administered 2016-01-17 – 2016-01-18 (×2): via INTRAVENOUS
  Administered 2016-01-18: 6.75 mg via INTRAVENOUS
  Administered 2016-01-19: 6 mg via INTRAVENOUS
  Administered 2016-01-19: 08:00:00 via INTRAVENOUS
  Administered 2016-01-19 (×2): 6 mg via INTRAVENOUS
  Administered 2016-01-20: 5.25 mg via INTRAVENOUS
  Administered 2016-01-20: 3 mg via INTRAVENOUS
  Administered 2016-01-20: 9 mg via INTRAVENOUS
  Administered 2016-01-20: 16:00:00 via INTRAVENOUS
  Administered 2016-01-20: 3 mg via INTRAVENOUS
  Administered 2016-01-21: 3.75 mg via INTRAVENOUS
  Administered 2016-01-21: 07:00:00 via INTRAVENOUS
  Administered 2016-01-22: 4.5 mg via INTRAVENOUS
  Administered 2016-01-22: 03:00:00 via INTRAVENOUS
  Administered 2016-01-22: 5.25 mg via INTRAVENOUS
  Filled 2016-01-17 (×7): qty 25

## 2016-01-17 MED ORDER — KETOROLAC TROMETHAMINE 15 MG/ML IJ SOLN
15.0000 mg | Freq: Once | INTRAMUSCULAR | Status: AC
  Administered 2016-01-17: 15 mg via INTRAVENOUS
  Filled 2016-01-17: qty 1

## 2016-01-17 MED ORDER — TRACE MINERALS CR-CU-MN-SE-ZN 10-1000-500-60 MCG/ML IV SOLN
INTRAVENOUS | Status: AC
  Administered 2016-01-17: 18:00:00 via INTRAVENOUS
  Filled 2016-01-17: qty 1992

## 2016-01-17 MED ORDER — FAT EMULSION 20 % IV EMUL
240.0000 mL | INTRAVENOUS | Status: AC
  Administered 2016-01-17: 240 mL via INTRAVENOUS
  Filled 2016-01-17: qty 250

## 2016-01-17 NOTE — Progress Notes (Signed)
PARENTERAL NUTRITION CONSULT NOTE - FOLLOW UP  Pharmacy Consult for TPN Indication: Pancreatitis  Allergies  Allergen Reactions  . Penicillins Rash    Patient Measurements: Height:  (162.6 cm) Weight: 186 lb 11.7 oz (84.7 kg) IBW/kg (Calculated) : 59.2 Adjusted Body Weight:  Usual Weight:   Vital Signs: Temp: 98 F (36.7 C) (01/20 0300) Temp Source: Oral (01/20 0300) BP: 119/80 mmHg (01/20 0300) Pulse Rate: 84 (01/20 0300) Intake/Output from previous day: 01/19 0701 - 01/20 0700 In: -  Out: 1080 [Urine:1080] Intake/Output from this shift:    Labs:  Recent Labs  01/15/16 0445 01/16/16 1035 01/17/16 0603  WBC 17.4* 15.2* 15.8*  HGB 13.2 13.1 13.8  HCT 39.5 38.3* 41.2  PLT 552* 564* 637*     Recent Labs  01/15/16 0445 01/16/16 0600  NA 135 135  K 3.9 4.1  CL 97* 99*  CO2 28 29  GLUCOSE 136* 143*  BUN 10 11  CREATININE 0.70 0.64  CALCIUM 9.2 9.0  MG  --  2.1  PHOS  --  4.6  PROT 7.2 7.1  ALBUMIN 2.8* 3.0*  AST 75* 75*  ALT 115* 131*  ALKPHOS 151* 147*  BILITOT 0.5 0.7   Estimated Creatinine Clearance: 120.5 mL/min (by C-G formula based on Cr of 0.64).    Recent Labs  01/14/16 2315  GLUCAP 119*    Medications:  Scheduled:  . enoxaparin (LOVENOX) injection  40 mg Subcutaneous Q24H  . fluconazole (DIFLUCAN) IV  200 mg Intravenous Q24H  . HYDROmorphone   Intravenous 6 times per day  . imipenem-cilastatin  500 mg Intravenous Q6H    Admit: 40 yo M came in with stomach pain and N/V. Found to have gallstone pancreatitis and ileus. Pharmacy consulted to dose TPN for nutrition support.  Nutritional Goals: per RD 1/17 Kcal: 1900-2100 Protein: 95-110 grams Fluid: 1.9-2.1 L  Current Nutrition:  NPO + ice chips TPN @ 45ml/hr + IVFE @ 37ml/hr. This provides 100 gm protein and 1900 kcals to meet 100% of goals.  D5NS @ 9ml/hr. This provides 245 kcal  Insulin Requirements in the past 24 hours:  None  Surgeries/Procedures: Plan  for cholecystectomy on another visit once more stable  GI:  Gallstone pancreatitis, for cholecystectomy once pancreatitis is better; ileus.  1/14 prealbumin 7.6 - low due to inflammatory process of pancreatitis. Albumin up to 3.0. LBM 1/9. GI and nutrition recommended enteral feeding but failed NGT placement 1/17 2nd N/V, & was pulled.  (+)abd pain with po intake, changed to NPO again. Still not tolerating clear liquids.     1/13:  CT (+)sig inc peripancreatic & RP fluid - c/w infarction or early necrosis.  Endo:  no hx DM, all CBGs < 150 w/ TPN at goal, SSI and CBGs stopped.  Lytes:  Lytes wnl and stable. CoCa 9.5. Phos and Mg ok today.  Renal:  SCr wnl, CrCl > 121ml/min. UOP remains stable. D5NS at 60 ml/hr  Pulm:  [place]-3L  Cards: BP ok and HR 90s  Hepatobil:  PMH HLD, on fenofibrate and atorvastatin PTA.  AST slightly elevated at 75, ALT eelvated at 131. TBili ok at 0.7. Triglycerides are down to 183 from 236.  Neuro: Patient still complains of LLQ abdominal pain. Dilaudid PCA, /24hr.  ID:  Primaxin for necrosis with acute pancreatitis. The new CT showed evidence of necrosis in tail of pancreas. AFeb in last 24hr. WBC stable around 15.8.  1/13 Imipenem >>  Best Practices: enoxaparin  TPN Access: PICC  placed 1/13  TPN start date: 1/13 >>  Plan:  Continue Clinimix E5/15 at 83 ml/hr Continue IVFE at 10 ml/hr Continue D5NS at 60 ml/hr to keep total IVF ~150 ml/hr This provides 100g of protein and 2139 kcal which meets 100% of patient needs Continue MVI and trace elements in TPN Monitor TPN labs, Bmet in AM  Enzo Bi, PharmD, [facility] Clinical Pharmacist Pager [phone] 01/17/2016 7:35 AM

## 2016-01-17 NOTE — Progress Notes (Signed)
Patient ID: Drew Prince, male   DOB: [birth year], 40 y.o.   MRN: [id number]   Subjective: Drew Prince continues to feel about the same today. He's asking to go up a touch on the dilaudid to 0.75mg  per dose which I've done. I've also started him on toradol to help control his pain for longer. He's asking for water and a popsicle because he was able to eat this without problem two days ago.  Objective: Vital signs in last 24 hours: Filed Vitals:   01/17/16 0300 01/17/16 0431 01/17/16 0548 01/17/16 0700  BP: 119/80   128/85  Pulse: 84   81  Temp: 98 F (36.7 C)   97.3 F (36.3 C)  TempSrc: Oral   Oral  Resp: Height:      Weight:      SpO2: 100% 99% 98% 100%   Physical exam: General: [name] man resting in bed pretty uncomfortable, appropriately conversational Abd: non-distended, with some stable ecchymoses in the left lower flank, mostly tender in the left lower quadrant without rebound or guarding Skin: numerous folliculocentric erythematous papules with subtle scale over buttocks extending over lateral thighs Ext: warm and well perfused, without pedal edema   Lab Results: CBC:  Recent Labs Lab 01/16/16 1035 01/17/16 0603  WBC 15.2* 15.8*  HGB 13.1 13.8  HCT 38.3* 41.2  MCV 79.0 79.4  PLT 564* 637*   Studies/Results: Mr 3d Recon At Scanner  01/17/2016  CLINICAL DATA:  Pancreatitis presumed to be from gallstones not resolving after 10 days, concern for a biliary anatomical defect. Pt has abdominal pain. mostly tender in the left lower quadrant EXAM: MRI ABDOMEN WITHOUT AND WITH CONTRAST (INCLUDING MRCP) TECHNIQUE: Multiplanar multisequence MR imaging of the abdomen was performed both before and after the administration of intravenous contrast. Heavily T2-weighted images of the biliary and pancreatic ducts were obtained, and three-dimensional MRCP images were rendered by post processing. CONTRAST:  16mL MULTIHANCE GADOBENATE DIMEGLUMINE 529 MG/ML IV SOLN  COMPARISON:  CT 01/10/2016 FINDINGS: Lower chest:  Lung bases are clear. Hepatobiliary: No intrahepatic biliary duct dilatation. The common bile duct is normal caliber without obstruction or choledocholithiasis. There is a gallstone within the gallbladder measuring 10 millimeters. No gallbladder inflammation identified. Pancreas: The pancreatic duct is thin and difficult to follow. No variant pancreatic ductal anatomy. The main pancreatic duct joins the common bile duct at the ampulla. The duct is followed through the head and body (image 27, series 3). There is a short segment of pancreas were the duct is difficult to follow. This is the same segment where there is a loss of enhancement on the post-contrast imaging (image 27, series 3 ; image 47, series 1302). Concern for pancreatic ductal interruption at this level. This region is the same region which was de vascularized on comparison and continues to demonstrate absent parenchymal enhancement. This region measures 2 centimeters in length (image 47, series 1301). The tail the pancreas has normal enhancement. The fluid collections along the body and tail the pancreas are now organized with enhancing thin rim. Fluid collections appear to communicate with the epicenter surrounding the region of pancreatic devascularization. The collection positioned between the stomach, pancreatic tail and spleen measures 9.7 x 4.7 centimeters (image 36, series 1302). Collection along the body of the pancreas measures 3.4 x 7.2 centimeters. The enhancing collections extend inferiorly along the anterior para renal fascia on the LEFT (image 60, series 1302). Spleen: Normal spleen. Adrenals/urinary tract: Adrenal glands and kidneys are normal.  Stomach/Bowel: Stomach and limited of the small bowel is unremarkable Vascular/Lymphatic: Abdominal aortic normal caliber. No retroperitoneal periportal lymphadenopathy. Musculoskeletal: No aggressive osseous lesion IMPRESSION: 1. Fluid  collections surrounding the body and tail the pancreas are more organized with thin enhancing rim consistent with pseudocyst formation. 2. Peripancreatic enhancing fluid collections have epicenter at a region devascularization of the mid pancreas. This pancreatic duct is difficult to follow through this region of devascularization with concern for ductal interruption at this level. The tail the pancreas is normal with normal enhanced. The distal body and head of the pancreas are normal. 3. No variant pancreatic ductal anatomy. 4. Common bile duct is normal caliber without obstruction. 5. Gallstones within the gallbladder. 6. No intrahepatic biliary duct dilatation. Electronically Signed   By: Genevive Bi M.D.   On: 01/17/2016 08:30   Medications: I have reviewed the patient's current medications. Scheduled Meds: . enoxaparin (LOVENOX) injection  40 mg Subcutaneous Q24H  . fluconazole (DIFLUCAN) IV  200 mg Intravenous Q24H  . HYDROmorphone   Intravenous 6 times per day  . imipenem-cilastatin  500 mg Intravenous Q6H  . ketorolac  15 mg Intravenous Once   Continuous Infusions: . dextrose 5 % and 0.9% NaCl 1,000 mL (01/17/16 0553)  . Marland KitchenTPN (CLINIMIX-E) Adult 83 mL/hr at 01/16/16 1710   And  . fat emulsion 240 mL (01/16/16 1711)   PRN Meds:.diphenhydrAMINE **OR** diphenhydrAMINE, naloxone **AND** sodium chloride, ondansetron (ZOFRAN) IV, sodium chloride   Assessment/Plan:  Acute pancreatitis with pseudocyst: He continues to make minimal clinical improvement. MRCP showed a pseudocyst in the body of the pancreas with normal biliary anatomy as we had expected. We'll manage conservatively but it's good to know it's there if he tanks down the road, this could potentially be drained. He's having a bit of pain so I've adjusted his PCA and added ketorolac. He's been afebrile and leukocytosis has trended slightly downward since he's been on the imipenem; I think this is mostly inflammatory responses  without infection so I'll stop it today. -Thanks gastroenterology for your help -Stopped imipenem-cilastin -Increased dilaudid from 0.5 to 0.75mg  per push -Started ketorolac  every 6 hours as needed for pain -Advance diet as tolerated per the patient's ability -Continue TPN -Continue D5 NS at 60 cc/hr -Holding home medications -Will need outpatient cholecystectomy once improved  Majocchi's granuloma: Treating with fluconazole for 4 weeks total. Can change to oral once he can tolerate. -Continue IV fluconazole  daily (stop date 2/19) -Ambulate every shift  Dispo: Disposition is deferred at this time, awaiting improvement of current medical problems.  The patient does have a current PCP [facility]) and does need an [facility] hospital follow-up appointment after discharge.  The patient does not know have transportation limitations that hinder transportation to clinic appointments.  .Services Needed at time of discharge: Y = Yes, Blank = No PT:   OT:   RN:   Equipment:   Other:     LOS: 12 days   Selina Cooley, MD 01/17/2016, 8:36 AM

## 2016-01-17 NOTE — Progress Notes (Signed)
Patient ID: Drew Prince, male   DOB: [birth year], 40 y.o.   MRN: [id number] Medicine attending: I examined this patient together with resident physician Dr. Selina Cooley and we reviewed MRI images. No intra-or extrahepatic biliary duct dilatation. Nonobstructing 10 mm gallstone in the gallbladder. No gallbladder inflammation. There has been pseudocyst formation since last CT study. Pancreatic duct difficult to follow in this region. Clinically he is still unable to take by mouth but his abdomen is less tender on exam today. We appreciate ongoing input from GI. No change in recommendation to continue current level of care. He remains on TPN for nutrition.

## 2016-01-17 NOTE — Care Management Note (Signed)
Case Management Note  Patient Details  Name: Drew Prince MRN: [id number] Date of Birth: 1976-08-29  Subjective/Objective:                    Action/Plan: Continued TPN/ PCA. CM following for discharge disposition.   Expected Discharge Date:                  Expected Discharge Plan:     In-House Referral:     Discharge planning Services     Post Acute Care Choice:    Choice offered to:     DME Arranged:    DME Agency:     HH Arranged:    HH Agency:     Status of Service:  In process, will continue to follow  Medicare Important Message Given:    Date Medicare IM Given:    Medicare IM give by:    Date Additional Medicare IM Given:    Additional Medicare Important Message give by:     If discussed at Long Length of Stay Meetings, dates discussed:    Additional Comments:  Kermit Balo, RN 01/17/2016, 3:49 PM

## 2016-01-17 NOTE — Progress Notes (Signed)
Nutrition Follow-up  DOCUMENTATION CODES:   Obesity unspecified  INTERVENTION:  Continue TPN per Pharmacy Monitor tolerance of clear liquids; if pt tolerate well, add Boost Breeze nutritional supplements   NUTRITION DIAGNOSIS:   Inadequate oral intake related to altered GI function as evidenced by NPO status.  Ongoing  GOAL:   Patient will meet greater than or equal to 90% of their needs  Being Met  MONITOR:   Diet advancement, Labs, Weight trends, Skin, I & O's  REASON FOR ASSESSMENT:   Consult Enteral/tube feeding initiation and management  ASSESSMENT:   40 year old Asian man presented to the emergency department and was admitted with a diagnosis of acute pancreatitis. He has had diffuse abdominal pain with associated nausea and vomiting for about 1 day. Otherwise has been in his usual state of health. Negative alcohol history. No new prescription or nonprescription medications.   Pt reports only consuming ice chips the past few days due to poor tolerance of clear liquids (states they were too sweet or too salty). He is ready to try clear liquids again today- about to try gelatin and popsicle at time of visit. TPN is running at goal rate of 83 ml/hr with IVFE '@10'$  ml/hr. No new weight today.  Labs: elevated AST/ALT (trend up over past few days)  Diet Order:  TPN (CLINIMIX-E) Adult Diet clear liquid Room service appropriate?: Yes; Fluid consistency:: Thin TPN (CLINIMIX-E) Adult  Skin:  Reviewed, no issues  Last BM:  1/12  Height:   Ht Readings from Last 1 Encounters:  01/10/16 '5\' 4"'$  (1.626 m)    Weight:   Wt Readings from Last 1 Encounters:  01/10/16 186 lb 11.7 oz (84.7 kg)    Ideal Body Weight:  59.1 kg  BMI:  Body mass index is 32.04 kg/(m^2).  Estimated Nutritional Needs:   Kcal:  1900-2100  Protein:  95-110 grams  Fluid:  1.9-2.1 L  EDUCATION NEEDS:   No education needs identified at this time  [place], LDN Inpatient  Clinical Dietitian Pager: [phone] After Hours Pager: [phone]

## 2016-01-17 NOTE — Progress Notes (Signed)
Subjective: No complaints.  No reports of any improvement.  Objective: Vital signs in last 24 hours: Temp:  [97.3 F (36.3 C)-98.4 F (36.9 C)] 97.3 F (36.3 C) (01/20 0700) Pulse Rate:  [81-99] 81 (01/20 0700) Resp:  [15-20] 19 (01/20 0700) BP: (117-140)/(76-86) 128/85 mmHg (01/20 0700) SpO2:  [98 %-100 %] 100 % (01/20 0700) FiO2 (%):  [0 %] 0 % (01/19 1551) Last BM Date: 01/06/16  Intake/Output from previous day: 01/19 0701 - 01/20 0700 In: -  Out: 1080 [Urine:1080] Intake/Output this shift:    General appearance: alert and no distress GI: mid abdominal tenderness  Lab Results:  Recent Labs  01/15/16 0445 01/16/16 1035 01/17/16 0603  WBC 17.4* 15.2* 15.8*  HGB 13.2 13.1 13.8  HCT 39.5 38.3* 41.2  PLT 552* 564* 637*   BMET  Recent Labs  01/15/16 0445 01/16/16 0600  NA 135 135  K 3.9 4.1  CL 97* 99*  CO2 28 29  GLUCOSE 136* 143*  BUN 10 11  CREATININE 0.70 0.64  CALCIUM 9.2 9.0   LFT  Recent Labs  01/16/16 0600  PROT 7.1  ALBUMIN 3.0*  AST 75*  ALT 131*  ALKPHOS 147*  BILITOT 0.7   PT/INR No results for input(s): LABPROT, INR in the last 72 hours. Hepatitis Panel  Recent Labs  01/14/16 1230  HEPBSAG Negative   C-Diff No results for input(s): CDIFFTOX in the last 72 hours. Fecal Lactopherrin No results for input(s): FECLLACTOFRN in the last 72 hours.  Studies/Results: No results found.  Medications:  Scheduled: . enoxaparin (LOVENOX) injection  40 mg Subcutaneous Q24H  . fluconazole (DIFLUCAN) IV  200 mg Intravenous Q24H  . HYDROmorphone   Intravenous 6 times per day  . imipenem-cilastatin  500 mg Intravenous Q6H   Continuous: . dextrose 5 % and 0.9% NaCl 1,000 mL (01/17/16 0553)  . Marland KitchenTPN (CLINIMIX-E) Adult 83 mL/hr at 01/16/16 1710   And  . fat emulsion 240 mL (01/16/16 1711)    Assessment/Plan: 1) Gallstone pancreatitis with necrosis.   WBC has improved.  Clinically no change.  He is trying to stay mobile.  Plan: 1)  Continue with current supportive care.   LOS: 12 days   Bricia Taher D 01/17/2016, 7:58 AM

## 2016-01-18 DIAGNOSIS — K8689 Other specified diseases of pancreas: Secondary | ICD-10-CM | POA: Insufficient documentation

## 2016-01-18 LAB — BASIC METABOLIC PANEL
Anion gap: 8 (ref 5–15)
BUN: 11 mg/dL (ref 6–20)
CALCIUM: 9.3 mg/dL (ref 8.9–10.3)
CHLORIDE: 100 mmol/L — AB (ref 101–111)
CO2: 29 mmol/L (ref 22–32)
CREATININE: 0.6 mg/dL — AB (ref 0.61–1.24)
GFR calc non Af Amer: >60 mL/min (ref >60)
Glucose, Bld: 135 mg/dL — ABNORMAL HIGH (ref 65–99)
Potassium: 4.1 mmol/L (ref 3.5–5.1)
SODIUM: 137 mmol/L (ref 135–145)

## 2016-01-18 LAB — CBC
HCT: 38.8 % — ABNORMAL LOW (ref 39.0–52.0)
Hemoglobin: 13 g/dL (ref 13.0–17.0)
MCH: 26.6 pg (ref 26.0–34.0)
MCHC: 33.5 g/dL (ref 30.0–36.0)
MCV: 79.5 fL (ref 78.0–100.0)
PLATELETS: 532 10*3/uL — AB (ref 150–400)
RBC: 4.88 MIL/uL (ref 4.22–5.81)
RDW: 13.8 % (ref 11.5–15.5)
WBC: 13.7 10*3/uL — ABNORMAL HIGH (ref 4.0–10.5)

## 2016-01-18 MED ORDER — KETOROLAC TROMETHAMINE 15 MG/ML IJ SOLN
15.0000 mg | Freq: Two times a day (BID) | INTRAMUSCULAR | Status: DC
  Administered 2016-01-18 – 2016-01-20 (×6): 15 mg via INTRAVENOUS
  Filled 2016-01-18 (×6): qty 1

## 2016-01-18 MED ORDER — FAT EMULSION 20 % IV EMUL
240.0000 mL | INTRAVENOUS | Status: AC
  Administered 2016-01-18: 240 mL via INTRAVENOUS
  Filled 2016-01-18: qty 250

## 2016-01-18 MED ORDER — TRACE MINERALS CR-CU-MN-SE-ZN 10-1000-500-60 MCG/ML IV SOLN
INTRAVENOUS | Status: AC
  Administered 2016-01-18: 18:00:00 via INTRAVENOUS
  Filled 2016-01-18: qty 1992

## 2016-01-18 NOTE — Progress Notes (Signed)
PARENTERAL NUTRITION CONSULT NOTE - FOLLOW UP  Pharmacy Consult for TPN Indication: Pancreatitis  Allergies  Allergen Reactions  . Penicillins Rash    Patient Measurements: Height:  (162.6 cm) Weight: 186 lb 11.7 oz (84.7 kg) IBW/kg (Calculated) : 59.2  Vital Signs: Temp: 97.8 F (36.6 C) (01/21 0450) Temp Source: Oral (01/21 0450) BP: 131/75 mmHg (01/21 0450) Pulse Rate: 94 (01/21 0450) Intake/Output from previous day: 01/20 0701 - 01/21 0700 In: -  Out: 375 [Urine:375] Intake/Output from this shift:    Labs:  Recent Labs  01/16/16 1035 01/17/16 0603 01/18/16 0539  WBC 15.2* 15.8* 13.7*  HGB 13.1 13.8 13.0  HCT 38.3* 41.2 38.8*  PLT 564* 637* 532*     Recent Labs  01/16/16 0600 01/18/16 0539  NA 135 137  K 4.1 4.1  CL 99* 100*  CO2 29 29  GLUCOSE 143* 135*  BUN 11 11  CREATININE 0.64 0.60*  CALCIUM 9.0 9.3  MG 2.1  --   PHOS 4.6  --   PROT 7.1  --   ALBUMIN 3.0*  --   AST 75*  --   ALT 131*  --   ALKPHOS 147*  --   BILITOT 0.7  --    Estimated Creatinine Clearance: 120.5 mL/min (by C-G formula based on Cr of 0.6).   No results for input(s): GLUCAP in the last 72 hours.  Medications:  Scheduled:  . enoxaparin (LOVENOX) injection  40 mg Subcutaneous Q24H  . fluconazole (DIFLUCAN) IV  200 mg Intravenous Q24H  . HYDROmorphone   Intravenous 6 times per day    Admit: 40 yo M came in with stomach pain and N/V. Found to have gallstone pancreatitis and ileus. Pharmacy consulted to dose TPN for nutrition support.  Nutritional Goals: per RD 1/17 Kcal: 1900-2100 Protein: 95-110 grams Fluid: 1.9-2.1 L  Current Nutrition:  Clear liquid diet TPN @ 38ml/hr + IVFE @ 70ml/hr. This provides 100 gm protein and 1900 kcals to meet 100% of goals.  D5NS @ 50ml/hr. This provides 245 kcal  Insulin Requirements in the past 24 hours:  None  Surgeries/Procedures: Plan for cholecystectomy on another visit once more stable  GI:  Gallstone  pancreatitis, for cholecystectomy once pancreatitis is better; ileus.  1/14 prealbumin 7.6 - low due to inflammatory process of pancreatitis. Albumin up to 3.0. LBM 1/9. GI and nutrition recommended enteral feeding but failed NGT placement 1/17 2nd N/V, & was pulled.  (+)abd pain with po intake, changed to NPO again. Now trying clear liquids again. No documentation if patient tolerating clears.  1/13:  CT (+)sig inc peripancreatic & RP fluid - c/w infarction or early necrosis.  Endo:  no hx DM, all CBGs < 150 w/ TPN at goal, SSI and CBGs stopped.  Lytes:  Lytes wnl and stable. CoCa 9.8. Phos and Mg ok on 1/19.  Renal:  SCr wnl, CrCl > 160ml/min. UOP low. D5NS at 60 ml/hr  Pulm:  [place]-3L  Cards: BP ok and HR 90s  Hepatobil:  PMH HLD, on fenofibrate and atorvastatin PTA.  AST slightly elevated at 75, ALT eelvated at 131. TBili ok at 0.7. Triglycerides are down to 183 from 236.  Neuro: Patient still complains of LLQ abdominal pain. Dilaudid PCA, /24hr.  ID:  S/p Primaxin for necrosis with acute pancreatitis. The new CT showed evidence of necrosis in tail of pancreas. AFeb in last 24hr. WBC stable around 13.7  1/13 Imipenem >> 1/20  Best Practices: enoxaparin  TPN Access: PICC  placed 1/13  TPN start date: 1/13 >>  Plan:  Continue Clinimix E5/15 at 83 ml/hr Continue IVFE at 10 ml/hr Continue D5NS at 60 ml/hr to keep total IVF ~150 ml/hr This provides 100g of protein and 2139 kcal which meets 100% of patient needs Continue MVI and trace elements in TPN Monitor TPN labs F/U if patient is tolerating diet   Enzo Bi, PharmD, BCPS Clinical Pharmacist Pager [phone] 01/18/2016 8:28 AM

## 2016-01-18 NOTE — Progress Notes (Signed)
   Patient Name: Drew Prince Date of Encounter: 01/18/2016, 12:58 PM    Subjective  About the same - still w/ abd pain - tx + flatus   Objective  BP 138/94 mmHg  Pulse 83  Temp(Src) 98 F (36.7 C) (Oral)  Resp 16  Ht  (1.626 m)  Wt 186 lb 11.7 oz (84.7 kg)  BMI 32.04 kg/m2  SpO2 100% abd distended, soft, mild upper abd tenderness BS + - quiet Awake and alert  MRI/MRCP suggests possible mid ductal disruption of pancreatic duct    Assessment and Plan  Severe acute gallstone pancreatitis complicated by pancreatic necrosis and possible ductal disruption    Stable on abx, TPN To continue this and other supportive care GI will f/u in a few days - call if needed sooner   Iva Boop, MD, [facility] [phone] (pager) [phone] after 5 PM, weekends and holidays  01/18/2016 12:58 PM

## 2016-01-18 NOTE — Progress Notes (Signed)
Patient ID: Sergi Gellner, male   DOB: July 11, 1976, 40 y.o.   MRN: [id number]   Subjective: Mr. Gurganus feels the same as yesterday. He was able to drink some water and eat a Popsicle yesterday afternoon. He still does not have a bowel movement in 10 days but he is passing gas.  Objective: Vital signs in last 24 hours: Filed Vitals:   01/17/16 2343 01/18/16 0400 01/18/16 0450 01/18/16 0800  BP:   131/75   Pulse:   94   Temp:   97.8 F (36.6 C)   TempSrc:   Oral   Resp: 100 Height:      Weight:      SpO2: 99% 99% 99% 100%   Physical exam: General: [name] man resting in bed pretty uncomfortable, appropriately conversational Abd: Slightly more distended today compared to yesterday, equally tender as previously exams, with some stable ecchymoses in the left lower flank, mostly tender in the left lower quadrant without rebound or guarding Skin: numerous folliculocentric erythematous papules with subtle scale over buttocks extending over lateral thighs Ext: warm and well perfused, without pedal edema   Lab Results: Basic Metabolic Panel:  Recent Labs Lab 01/13/16 0601  01/16/16 0600 01/18/16 0539  NA 134*  < > 135 137  K 3.8  < > 4.1 4.1  CL 99*  < > 99* 100*  CO2 27  < > 29 29  GLUCOSE 132*  < > 143* 135*  BUN 8  < > 11 11  CREATININE 0.67  < > 0.64 0.60*  CALCIUM 8.5*  < > 9.0 9.3  MG 2.0  --  2.1  --   PHOS 3.5  --  4.6  --   < > = values in this interval not displayed.   CBC:  Recent Labs Lab 01/17/16 0603 01/18/16 0539  WBC 15.8* 13.7*  HGB 13.8 13.0  HCT 41.2 38.8*  MCV 79.4 79.5  PLT 637* 532*   Medications: I have reviewed the patient's current medications. Scheduled Meds: . enoxaparin (LOVENOX) injection  40 mg Subcutaneous Q24H  . fluconazole (DIFLUCAN) IV  200 mg Intravenous Q24H  . HYDROmorphone   Intravenous 6 times per day   Continuous Infusions: . dextrose 5 % and 0.9% NaCl 60 mL/hr at 01/17/16 2155  . Marland KitchenTPN (CLINIMIX-E) Adult 83  mL/hr at 01/17/16 1732   And  . fat emulsion 240 mL (01/17/16 1732)  . Marland KitchenTPN (CLINIMIX-E) Adult     And  . fat emulsion     PRN Meds:.diphenhydrAMINE **OR** diphenhydrAMINE, naloxone **AND** sodium chloride, ondansetron (ZOFRAN) IV, sodium chloride   Assessment/Plan:  Acute pancreatitis with pseudocyst: This is hospital day 13 and he continues to make little clinical improvement. Imipenem was stopped 1/20. He remained afebrile, and leukocytosis is trending down. We will continue to hold the course today with frequent abdominal exams with a low threshold to re-scan and call general surgery. -Thanks gastroenterology for your help -Advance diet as tolerated per the patient's ability -Ambulate as much as possible -Continue TPN -Continue PCA -Continue ketorolac  every 6 hours as needed for pain -Continue D5 NS at 60 cc/hr -Holding home medications -Will need outpatient cholecystectomy once improved  Majocchi's granuloma: Treating with fluconazole for 4 weeks total. Can change to oral once he can tolerate. -Continue IV fluconazole  daily (stop date 2/19) -Ambulate every shift  Dispo: Disposition is deferred at this time, awaiting improvement of current medical problems.  The patient does have a current PCP (  [facility]) and does need an [facility] hospital follow-up appointment after discharge.  The patient does have transportation limitations that hinder transportation to clinic appointments.  .Services Needed at time of discharge: Y = Yes, Blank = No PT:   OT:   RN:   Equipment:   Other:     LOS: 13 days   Selina Cooley, MD 01/18/2016, 9:57 AM

## 2016-01-19 LAB — COMPREHENSIVE METABOLIC PANEL
ALBUMIN: 3.1 g/dL — AB (ref 3.5–5.0)
ALT: 130 U/L — ABNORMAL HIGH (ref 17–63)
ANION GAP: 7 (ref 5–15)
AST: 83 U/L — ABNORMAL HIGH (ref 15–41)
Alkaline Phosphatase: 132 U/L — ABNORMAL HIGH (ref 38–126)
BILIRUBIN TOTAL: 0.7 mg/dL (ref 0.3–1.2)
BUN: 12 mg/dL (ref 6–20)
CHLORIDE: 100 mmol/L — AB (ref 101–111)
CO2: 31 mmol/L (ref 22–32)
Calcium: 9.2 mg/dL (ref 8.9–10.3)
Creatinine, Ser: 0.67 mg/dL (ref 0.61–1.24)
GFR calc Af Amer: >60 mL/min (ref >60)
GFR calc non Af Amer: >60 mL/min (ref >60)
GLUCOSE: 118 mg/dL — AB (ref 65–99)
POTASSIUM: 4.1 mmol/L (ref 3.5–5.1)
SODIUM: 138 mmol/L (ref 135–145)
TOTAL PROTEIN: 6.8 g/dL (ref 6.5–8.1)

## 2016-01-19 LAB — CBC
HCT: 37.8 % — ABNORMAL LOW (ref 39.0–52.0)
Hemoglobin: 12.7 g/dL — ABNORMAL LOW (ref 13.0–17.0)
MCH: 26.8 pg (ref 26.0–34.0)
MCHC: 33.6 g/dL (ref 30.0–36.0)
MCV: 79.9 fL (ref 78.0–100.0)
PLATELETS: 496 10*3/uL — AB (ref 150–400)
RBC: 4.73 MIL/uL (ref 4.22–5.81)
RDW: 13.8 % (ref 11.5–15.5)
WBC: 10.3 10*3/uL (ref 4.0–10.5)

## 2016-01-19 MED ORDER — FAT EMULSION 20 % IV EMUL
240.0000 mL | INTRAVENOUS | Status: AC
  Administered 2016-01-19: 240 mL via INTRAVENOUS
  Filled 2016-01-19: qty 250

## 2016-01-19 MED ORDER — TRACE MINERALS CR-CU-MN-SE-ZN 10-1000-500-60 MCG/ML IV SOLN
INTRAVENOUS | Status: AC
  Administered 2016-01-19: 18:00:00 via INTRAVENOUS
  Filled 2016-01-19: qty 1992

## 2016-01-19 NOTE — Progress Notes (Signed)
PARENTERAL NUTRITION CONSULT NOTE - FOLLOW UP  Pharmacy Consult for TPN Indication: Pancreatitis  Allergies  Allergen Reactions  . Penicillins Rash    Patient Measurements: Height: _0  (162.6 cm) Weight: 186 lb 11.7 oz (84.7 kg) IBW/kg (Calculated) : 59.2  Vital Signs: Temp: 97.7 F (36.5 C) (01/22 0539) Temp Source: Oral (01/22 0539) BP: 139/90 mmHg (01/22 0539) Pulse Rate: 86 (01/22 0539) Intake/Output from previous day: 01/21 0701 - 01/22 0700 In: -  Out: 2050 [Urine:2050] Intake/Output from this shift:    Labs:  Recent Labs  01/17/16 0603 01/18/16 0539 01/19/16 0443  WBC 15.8* 13.7* 10.3  HGB 13.8 13.0 12.7*  HCT 41.2 38.8* 37.8*  PLT 637* 532* 496*     Recent Labs  01/18/16 0539 01/19/16 0443  NA 137 138  K 4.1 4.1  CL 100* 100*  CO2 29 31  GLUCOSE 135* 118*  BUN 11 12  CREATININE 0.60* 0.67  CALCIUM 9.3 9.2  PROT  --  6.8  ALBUMIN  --  3.1*  AST  --  83*  ALT  --  130*  ALKPHOS  --  132*  BILITOT  --  0.7   Estimated Creatinine Clearance: 120.5 mL/min (by C-G formula based on Cr of 0.67).   No results for input(s): GLUCAP in the last 72 hours.  Medications:  Scheduled:  . enoxaparin (LOVENOX) injection  40 mg Subcutaneous Q24H  . fluconazole (DIFLUCAN) IV  200 mg Intravenous Q24H  . HYDROmorphone   Intravenous 6 times per day  . ketorolac  15 mg Intravenous Q12H    Admit: 40 yo M came in with stomach pain and N/V. Found to have gallstone pancreatitis and ileus. Pharmacy consulted to dose TPN for nutrition support.  Nutritional Goals: per RD 1/20 Kcal: 1900-2100 Protein: 95-110 grams Fluid: 1.9-2.1 L  Current Nutrition:  Clear liquid diet TPN @ 76m/hr + IVFE @ 175mhr. This provides 100 gm protein and 1900 kcals to meet 100% of goals.  D5NS @ 6046mr. This provides 245 kcal  Insulin Requirements in the past 24 hours:  None  Surgeries/Procedures: Plan for cholecystectomy on another visit once more stable  GI:   Gallstone pancreatitis, for cholecystectomy once pancreatitis is better; ileus.  1/14 prealbumin 7.6 - low due to inflammatory process of pancreatitis. Albumin up to 3.1. LBM 1/9. GI and nutrition recommended enteral feeding but failed NGT placement 1/17 2nd N/V, & was pulled.  (+)abd pain with po intake, changed to NPO again. Now trying clear liquids again. Patient was able to drink some water and eat a popsicle but nothing else. Still no BM since 1/10, but passing gas.  1/13:  CT (+)sig inc peripancreatic & RP fluid - c/w infarction or early necrosis.  Endo:  no hx DM, all CBGs < 150 w/ TPN at goal, SSI and CBGs stopped.  Lytes:  Lytes wnl and stable. CoCa 9.6. Phos and Mg ok on 1/19.  Renal:  SCr wnl, CrCl > 100m34mn. UOP back up to 1ml/69mhr yesterday. D5NS at 60 ml/hr  Pulm:  [place]-2L  Cards: BP ok and HR 90s  Hepatobil:  PMH HLD, on fenofibrate and atorvastatin PTA.  AST slightly elevated but stable at 83, ALT elevated but stable at 130. Alk Phos 132. TBili ok at 0.7. Triglycerides are down to 183 from 236.  Neuro: Patient still complains of LLQ abdominal pain. Dilaudid PCA, 12mg/70m.  ID:  S/p Primaxin for necrosis with acute pancreatitis. The new CT showed evidence of necrosis in tail  of pancreas. AFeb in last 24hr. WBC down to wnl.  1/13 Imipenem >> 1/20  Best Practices: enoxaparin  TPN Access: PICC placed 1/13  TPN start date: 1/13 >>  Plan:  Continue Clinimix E5/15 at 83 ml/hr Continue IVFE at 10 ml/hr Continue D5NS at 60 ml/hr to keep total IVF ~150 ml/hr This provides 100g of protein and 2139 kcal which meets 100% of patient needs Continue MVI and trace elements in TPN Monitor TPN labs F/U further toleration of diet  Elenor Quinones, PharmD, BCPS Clinical Pharmacist Pager [phone] 01/19/2016 7:04 AM

## 2016-01-19 NOTE — Progress Notes (Signed)
Patient ID: Drew Prince, male   DOB: [birth year], 40 y.o.   MRN: [id number]   Subjective: No acute events overnight. He reports his pain is about the same. He was able to take a few bites of jello this morning. He has been eating ice chips. Reports some flatus, no bowel movement.   Objective: Vital signs in last 24 hours: Filed Vitals:   01/19/16 0000 01/19/16 0206 01/19/16 0400 01/19/16 0539  BP:  126/86  139/90  Pulse:  77  86  Temp:  97.7 F (36.5 C)  97.7 F (36.5 C)  TempSrc:  Oral  Oral  Resp: Height:      Weight:      SpO2: 100% 100% 99% 100%   Physical exam: General: Drew Prince man resting in bed pretty uncomfortable, appropriately conversational Abd: abdominal distention same as yesterday, tenderness slightly better than previous, with some stable ecchymoses in the left lower flank, mostly tender in the left lower quadrant without rebound or guarding Skin: numerous folliculocentric erythematous papules with subtle scale over buttocks extending over lateral thighs Ext: warm and well perfused, without pedal edema   Lab Results: Basic Metabolic Panel:  Recent Labs Lab 01/13/16 0601  01/16/16 0600 01/18/16 0539 01/19/16 0443  NA 134*  < > 135 137 138  K 3.8  < > 4.1 4.1 4.1  CL 99*  < > 99* 100* 100*  CO2 27  < > GLUCOSE 132*  < > 143* 135* 118*  BUN 8  < > CREATININE 0.67  < > 0.64 0.60* 0.67  CALCIUM 8.5*  < > 9.0 9.3 9.2  MG 2.0  --  2.1  --   --   PHOS 3.5  --  4.6  --   --   < > = values in this interval not displayed.   CBC:  Recent Labs Lab 01/18/16 0539 01/19/16 0443  WBC 13.7* 10.3  HGB 13.0 12.7*  HCT 38.8* 37.8*  MCV 79.5 79.9  PLT 532* 496*   Medications: I have reviewed the patient's current medications. Scheduled Meds: . enoxaparin (LOVENOX) injection  40 mg Subcutaneous Q24H  . fluconazole (DIFLUCAN) IV  200 mg Intravenous Q24H  . HYDROmorphone   Intravenous 6 times per day  . ketorolac  15 mg  Intravenous Q12H   Continuous Infusions: . dextrose 5 % and 0.9% NaCl 60 mL/hr at 01/17/16 2155  . Marland KitchenTPN (CLINIMIX-E) Adult 83 mL/hr at 01/18/16 1731   And  . fat emulsion 240 mL (01/18/16 1731)  . Marland KitchenTPN (CLINIMIX-E) Adult     And  . fat emulsion     PRN Meds:.diphenhydrAMINE **OR** diphenhydrAMINE, naloxone **AND** sodium chloride, ondansetron (ZOFRAN) IV, sodium chloride   Assessment/Plan:  Acute pancreatitis with pseudocyst: Remains clinically stable with slow improvement. Imipenem was stopped 1/20. He remains afebrile, and leukocytosis resolving. We will continue to hold the course today with frequent abdominal exams with a low threshold to re-scan and call general surgery. - Thanks gastroenterology for your help - Advance diet as tolerated per the patient's ability - Ambulate as much as possible - Continue TPN - Continue PCA - Continue ketorolac  every 6 hours as needed for pain - Continue D5 NS at 60 cc/hr - Holding home medications - Will need outpatient cholecystectomy once improved  Majocchi's granuloma: Treating with fluconazole for 4 weeks total. Can change to oral once he can tolerate. - Continue IV fluconazole  daily (stop  date 2/19) - Ambulate every shift  Diet: Clears, as tolerated VTE Ppx: Lovenox SQ Dispo: Disposition is deferred at this time, awaiting improvement of current medical problems.  The patient does have a current PCP [facility]) and does need an [facility] hospital follow-up appointment after discharge.  The patient does have transportation limitations that hinder transportation to clinic appointments.  .Services Needed at time of discharge: Y = Yes, Blank = No PT:   OT:   RN:   Equipment:   Other:     LOS: 14 days   Su Hoff, MD 01/19/2016, 7:40 AM

## 2016-01-20 LAB — COMPREHENSIVE METABOLIC PANEL
ALK PHOS: 133 U/L — AB (ref 38–126)
ALT: 200 U/L — ABNORMAL HIGH (ref 17–63)
ANION GAP: 8 (ref 5–15)
AST: 115 U/L — ABNORMAL HIGH (ref 15–41)
Albumin: 3.2 g/dL — ABNORMAL LOW (ref 3.5–5.0)
BILIRUBIN TOTAL: 0.7 mg/dL (ref 0.3–1.2)
BUN: 12 mg/dL (ref 6–20)
CALCIUM: 9.4 mg/dL (ref 8.9–10.3)
CO2: 31 mmol/L (ref 22–32)
Chloride: 99 mmol/L — ABNORMAL LOW (ref 101–111)
Creatinine, Ser: 0.7 mg/dL (ref 0.61–1.24)
GFR calc non Af Amer: >60 mL/min (ref >60)
GLUCOSE: 109 mg/dL — AB (ref 65–99)
Potassium: 4.2 mmol/L (ref 3.5–5.1)
Sodium: 138 mmol/L (ref 135–145)
TOTAL PROTEIN: 6.9 g/dL (ref 6.5–8.1)

## 2016-01-20 LAB — PREALBUMIN: PREALBUMIN: 26.6 mg/dL (ref 18–38)

## 2016-01-20 LAB — CBC
HCT: 38.2 % — ABNORMAL LOW (ref 39.0–52.0)
HEMOGLOBIN: 12.7 g/dL — AB (ref 13.0–17.0)
MCH: 26.5 pg (ref 26.0–34.0)
MCHC: 33.2 g/dL (ref 30.0–36.0)
MCV: 79.7 fL (ref 78.0–100.0)
Platelets: 480 10*3/uL — ABNORMAL HIGH (ref 150–400)
RBC: 4.79 MIL/uL (ref 4.22–5.81)
RDW: 13.6 % (ref 11.5–15.5)
WBC: 9.8 10*3/uL (ref 4.0–10.5)

## 2016-01-20 LAB — MAGNESIUM: Magnesium: 2 mg/dL (ref 1.7–2.4)

## 2016-01-20 LAB — PHOSPHORUS: Phosphorus: 5.4 mg/dL — ABNORMAL HIGH (ref 2.5–4.6)

## 2016-01-20 LAB — TRIGLYCERIDES: TRIGLYCERIDES: 222 mg/dL — AB (ref <150)

## 2016-01-20 MED ORDER — TRACE MINERALS CR-CU-MN-SE-ZN 10-1000-500-60 MCG/ML IV SOLN
INTRAVENOUS | Status: AC
  Administered 2016-01-20: 18:00:00 via INTRAVENOUS
  Filled 2016-01-20: qty 1992

## 2016-01-20 MED ORDER — FAT EMULSION 20 % IV EMUL
240.0000 mL | INTRAVENOUS | Status: AC
  Administered 2016-01-20: 240 mL via INTRAVENOUS
  Filled 2016-01-20: qty 250

## 2016-01-20 NOTE — Progress Notes (Signed)
Patient ID: Drew Prince, male   DOB: Sep 15, 1976, 40 y.o.   MRN: [id number]   Subjective: No acute events overnight. Reports he was able to take a few bites of jello this morning. Pain is slowly getting better.   Objective: Vital signs in last 24 hours: Filed Vitals:   01/20/16 0107 01/20/16 0426 01/20/16 0800 01/20/16 1004  BP:    128/73  Pulse:    74  Temp:    98.2 F (36.8 C)  TempSrc:    Oral  Resp: Height:      Weight:      SpO2: 97% 100% 100% 100%   Physical exam: General: [name] man resting in bed, appropriately conversational Abd: abdomen with mild distention, tenderness slightly better than previous, with some stable ecchymoses in the left lower flank, mostly tender in the left lower quadrant without rebound or guarding Skin: numerous folliculocentric erythematous papules with subtle scale over buttocks extending over lateral thighs Ext: warm and well perfused, without pedal edema   Lab Results: Basic Metabolic Panel:  Recent Labs Lab 01/16/16 0600  01/19/16 0443 01/20/16 0540  NA 135  < > 138 138  K 4.1  < > 4.1 4.2  CL 99*  < > 100* 99*  CO2 29  < > 31 31  GLUCOSE 143*  < > 118* 109*  BUN 11  < > 12 12  CREATININE 0.64  < > 0.67 0.70  CALCIUM 9.0  < > 9.2 9.4  MG 2.1  --   --  2.0  PHOS 4.6  --   --  5.4*  < > = values in this interval not displayed.   CBC:  Recent Labs Lab 01/19/16 0443 01/20/16 0540  WBC 10.3 9.8  HGB 12.7* 12.7*  HCT 37.8* 38.2*  MCV 79.9 79.7  PLT 496* 480*   Medications: I have reviewed the patient's current medications. Scheduled Meds: . enoxaparin (LOVENOX) injection  40 mg Subcutaneous Q24H  . fluconazole (DIFLUCAN) IV  200 mg Intravenous Q24H  . HYDROmorphone   Intravenous 6 times per day  . ketorolac  15 mg Intravenous Q12H   Continuous Infusions: . dextrose 5 % and 0.9% NaCl 60 mL/hr at 01/20/16 0238  . Marland KitchenTPN (CLINIMIX-E) Adult 83 mL/hr at 01/19/16 1733   And  . fat emulsion 240 mL (01/19/16  1734)  . Marland KitchenTPN (CLINIMIX-E) Adult     And  . fat emulsion     PRN Meds:.diphenhydrAMINE **OR** diphenhydrAMINE, naloxone **AND** sodium chloride, ondansetron (ZOFRAN) IV, sodium chloride   Assessment/Plan:  Acute pancreatitis with pseudocyst: Remains clinically stable with slow improvement. Imipenem was stopped 1/20. He remains afebrile, and leukocytosis resolved. We will continue to hold the course today with IVFs/TPN and pain control. - Thanks gastroenterology for your help - Advance diet as tolerated per the patient's ability - Ambulate as much as possible - Continue TPN - Continue PCA - Continue ketorolac  every 6 hours as needed for pain - Continue D5 NS at 60 cc/hr - Holding home medications - Will need outpatient cholecystectomy once improved  Majocchi's granuloma: Treating with fluconazole for 4 weeks total. Can change to oral once he can tolerate. - Continue IV fluconazole  daily (stop date 2/19) - Ambulate every shift  Diet: Clears, as tolerated VTE Ppx: Lovenox SQ Dispo: Disposition is deferred at this time, awaiting improvement of current medical problems.  The patient does have a current PCP [facility]) and does need an  [facility] hospital follow-up appointment after discharge.  The patient does have transportation limitations that hinder transportation to clinic appointments.  .Services Needed at time of discharge: Y = Yes, Blank = No PT:   OT:   RN:   Equipment:   Other:     LOS: 15 days   Su Hoff, MD 01/20/2016, 11:40 AM

## 2016-01-20 NOTE — Progress Notes (Signed)
Physical Therapy Treatment and DIscharge Patient Details Name: Drew Prince MRN: [id number] DOB: June 05, 1976 Today's Date: 01/20/2016    History of Present Illness Pt is a 40 y/o male who presents with acute moderate pancreatitis.     PT Comments    Patient mobilizing independently except for ambulation in hall (supervision) due to "disoriented" due to pain medicine. Encouraged patient to ask for nursing to assist him to walk in halls. Encouraged to try walking without pushing PCA immediately prior (if he can tolerate, will make him feel better when walking). Educated on need to spend more time OOB sitting upright and less time lying in bed. Worked on positioning in recliner to achieve the "just right position" to minimize abd pain (not too flexed and not too extended). No further PT needs identified. PT is signing off.    Follow Up Recommendations  No PT follow up;Supervision - Intermittent     Equipment Recommendations  None recommended by PT    Recommendations for Other Services       Precautions / Restrictions Precautions Precautions: Fall Precaution Comments: reports feeling woozy due to pain meds/PCA Restrictions Weight Bearing Restrictions: No    Mobility  Bed Mobility Overal bed mobility: Modified Independent             General bed mobility comments: Increased time required due to pain. Pt was instructed in log roll for abdominal comfort.  Transfers Overall transfer level: Modified independent Equipment used: None                Ambulation/Gait Ambulation/Gait assistance: Supervision Ambulation Distance (Feet): 250 Feet Assistive device:  (occasional railings in hall vs none) Gait Pattern/deviations: Step-through pattern;Decreased stride length;Drifts right/left Gait velocity: Decreased   General Gait Details: Patient reports he feels dizzy/disoriented. States he thinks it is the pain medicine (he pushed PCA just prior to OOB). Reaches for  external support, however was able to walk backwards out of his room (due to position of IV pole) and use his foot to swing door wider open.    Stairs            Wheelchair Mobility    Modified Rankin (Stroke Patients Only)       Balance Overall balance assessment: No apparent balance deficits (not formally assessed)                                  Cognition Arousal/Alertness: Awake/alert Behavior During Therapy: Flat affect Overall Cognitive Status: Within Functional Limits for tasks assessed                      Exercises Other Exercises Other Exercises: Patient reports he has done his exercise program each day. Instructions sitting on his bedside table.    General Comments        Pertinent Vitals/Pain Pain Assessment: 0-10 Faces Pain Scale: Hurts little more Pain Location: abd Pain Descriptors / Indicators: Grimacing;Discomfort Pain Intervention(s): Limited activity within patient's tolerance;Monitored during session;Premedicated before session;Repositioned (Pt used PCA prior to attempt OOB)    Home Living                      Prior Function            PT Goals (current goals can now be found in the care plan section) Acute Rehab PT Goals Patient Stated Goal: Decrease pain Time For Goal Achievement: 01/18/16 Progress towards  PT goals: Goals met/education completed, patient discharged from PT (Gait at supervision level due to use of PCA)    Frequency       PT Plan Current plan remains appropriate    Co-evaluation             End of Session Equipment Utilized During Treatment: Oxygen (Pt on RA throughout session, back to supplemental O2 at end) Activity Tolerance: Patient tolerated treatment well Patient left: with call bell/phone within reach;in chair     Time: 6811-5726 PT Time Calculation (min) (ACUTE ONLY): 29 min  Charges:  $Gait Training: 23-37 mins                    G Codes:       Beautiful [place] 2016-02-18, 4:03 PM Pager [phone]

## 2016-01-20 NOTE — Progress Notes (Signed)
Nutrition Follow-up  DOCUMENTATION CODES:   Obesity unspecified  INTERVENTION:  Continue TPN at goal rate, per pharmacy Monitor tolerance of PO's Diet advancement per MD  NUTRITION DIAGNOSIS:   Inadequate oral intake related to altered GI function as evidenced by NPO status.  Ongoing; clear liquids  GOAL:   Patient will meet greater than or equal to 90% of their needs  Being Met  MONITOR:   Diet advancement, Labs, Weight trends, Skin, I & O's  REASON FOR ASSESSMENT:   Consult Enteral/tube feeding initiation and management  ASSESSMENT:   40 year old Asian man presented to the emergency department and was admitted with a diagnosis of acute pancreatitis. He has had diffuse abdominal pain with associated nausea and vomiting for about 1 day. Otherwise has been in his usual state of health. Negative alcohol history. No new prescription or nonprescription medications.   Pt continues to receive TPN at goal rate, meeting 100% of estimated energy and protein needs. Pt reports tolerating very small amounts of popsicle and gelatin. He does not like the salty and sweet options of the clear liquids and reports abdominal pain/bloating. Explained what a full liquid diet entails- he does not feel ready for full liquids yet. RN to obtain new weight today.  Labs: high ALT/AST, elevated triglycerides  Diet Order:  Diet clear liquid Room service appropriate?: Yes; Fluid consistency:: Thin TPN (CLINIMIX-E) Adult TPN (CLINIMIX-E) Adult  Skin:  Reviewed, no issues  Last BM:  1/12  Height:   Ht Readings from Last 1 Encounters:  01/10/16 _0  (1.626 m)    Weight:   Wt Readings from Last 1 Encounters:  01/10/16 186 lb 11.7 oz (84.7 kg)    Ideal Body Weight:  59.1 kg  BMI:  Body mass index is 32.04 kg/(m^2).  Estimated Nutritional Needs:   Kcal:  1900-2100  Protein:  95-110 grams  Fluid:  1.9-2.1 L  EDUCATION NEEDS:   No education needs identified at this  time  [place], LDN Inpatient Clinical Dietitian Pager: [phone] After Hours Pager: [phone]

## 2016-01-20 NOTE — Progress Notes (Signed)
PARENTERAL NUTRITION CONSULT NOTE - FOLLOW UP  Pharmacy Consult for TPN Indication: Pancreatitis  Allergies  Allergen Reactions  . Penicillins Rash    Patient Measurements: Height: _0  (162.6 cm) Weight: 186 lb 11.7 oz (84.7 kg) IBW/kg (Calculated) : 59.2  Vital Signs: Temp: 98.2 F (36.8 C) (01/22 2250) Temp Source: Oral (01/22 2250) BP: 126/83 mmHg (01/22 2250) Pulse Rate: 88 (01/22 2250) Intake/Output from previous day: 01/22 0701 - 01/23 0700 In: -  Out: 2250 [Urine:2250] Intake/Output from this shift:    Labs:  Recent Labs  01/18/16 0539 01/19/16 0443 01/20/16 0540  WBC 13.7* 10.3 9.8  HGB 13.0 12.7* 12.7*  HCT 38.8* 37.8* 38.2*  PLT 532* 496* 480*     Recent Labs  01/18/16 0539 01/19/16 0443 01/20/16 0540  NA 137 138 138  K 4.1 4.1 4.2  CL 100* 100* 99*  CO2 _1 GLUCOSE 135* 118* 109*  BUN _2 CREATININE 0.60* 0.67 0.70  CALCIUM 9.3 9.2 9.4  MG  --   --  2.0  PHOS  --   --  5.4*  PROT  --  6.8 6.9  ALBUMIN  --  3.1* 3.2*  AST  --  83* 115*  ALT  --  130* 200*  ALKPHOS  --  132* 133*  BILITOT  --  0.7 0.7  PREALBUMIN  --   --  26.6  TRIG  --   --  222*   Estimated Creatinine Clearance: 120.5 mL/min (by C-G formula based on Cr of 0.7).   No results for input(s): GLUCAP in the last 72 hours.  Medications:  Scheduled:  . enoxaparin (LOVENOX) injection  40 mg Subcutaneous Q24H  . fluconazole (DIFLUCAN) IV  200 mg Intravenous Q24H  . HYDROmorphone   Intravenous 6 times per day  . ketorolac  15 mg Intravenous Q12H    Admit: 40 yo M w/ chief complaint of stomach pain and N/V. Found to have gallstone pancreatitis and ileus. Pharmacy consulted to dose TPN for nutrition support.  Nutritional Goals: per RD 1/20 Kcal: 1900-2100 Protein: 95-110 grams Fluid: 1.9-2.1 L  Current Nutrition:  Clear liquid diet TPN @ 34m/hr + IVFE @ 139mhr. This provides 100 gm protein and 1900 kcals to meet 100% of goals.  D5NS @  6057mr. This provides 245 kcal  Insulin Requirements in the past 24 hours:  None  Surgeries/Procedures: Plan for cholecystectomy on another visit once more stable  GI:  Sever acute gallstone pancreatitis complicated by pancreatic necrosis and possible ductal disruption, for cholecystectomy once pancreatitis is better.Prealbumin improved to 26.6. Albumin up to 3.2. LBM 1/9. GI and nutrition recommended enteral feeding but failed NGT placement 1/17 2nd N/V, & was pulled. Patient had (+)abd pain with po intake, then changed to NPO again. Now trying clear liquids again. Still w/ abdominal pain. (+) flatus. Still no BM since 1/10  1/13:  CT (+)sig inc peripancreatic & RP fluid - c/w infarction or early necrosis.  Endo:  no hx DM, all CBGs < 150 w/ TPN at goal, SSI and CBGs stopped.  Lytes:  Lytes wnl and stable. CoCa 10. Phos 5.4 and Mg ok on 1/23.  Renal:  SCr wnl, CrCl > 100m65mn. UOP back up to 1.1 ml/kg/hr yesterday. D5NS at 60 ml/hr  Pulm:  [place]-2L  Cards: VSS  Hepatobil:  PMH HLD, on fenofibrate and atorvastatin PTA.  AST/ ALT up to 115/200. Alk Phos 133. TBili ok at 0.7. Triglycerides back up to  222.  Neuro: Patient still complains of LLQ abdominal pain. Dilaudid PCA, 82m/24hr.  ID:  S/p Primaxin for necrosis with acute pancreatitis. The new CT showed evidence of necrosis in tail of pancreas. AFeb in last 24hr. WBC down to wnl. 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 Monitor TG closely  Continue D5NS at 60 ml/hr to keep total IVF ~150 ml/hr This provides 100g of protein and 2139 kcal which meets 100% of patient needs Continue MVI and trace elements in TPN Monitor TPN labs F/U further toleration of diet  BAlbertina Parr PharmD., BCPS Clinical Pharmacist Pager  [phone]

## 2016-01-21 LAB — COMPREHENSIVE METABOLIC PANEL
ALT: 151 U/L — AB (ref 17–63)
AST: 77 U/L — AB (ref 15–41)
Albumin: 3.1 g/dL — ABNORMAL LOW (ref 3.5–5.0)
Alkaline Phosphatase: 124 U/L (ref 38–126)
Anion gap: 9 (ref 5–15)
BILIRUBIN TOTAL: 0.9 mg/dL (ref 0.3–1.2)
BUN: 13 mg/dL (ref 6–20)
CO2: 30 mmol/L (ref 22–32)
CREATININE: 0.78 mg/dL (ref 0.61–1.24)
Calcium: 9.1 mg/dL (ref 8.9–10.3)
Chloride: 98 mmol/L — ABNORMAL LOW (ref 101–111)
Glucose, Bld: 121 mg/dL — ABNORMAL HIGH (ref 65–99)
POTASSIUM: 4.2 mmol/L (ref 3.5–5.1)
Sodium: 137 mmol/L (ref 135–145)
TOTAL PROTEIN: 6.7 g/dL (ref 6.5–8.1)

## 2016-01-21 LAB — CBC
HEMATOCRIT: 37.3 % — AB (ref 39.0–52.0)
Hemoglobin: 12.2 g/dL — ABNORMAL LOW (ref 13.0–17.0)
MCH: 26.1 pg (ref 26.0–34.0)
MCHC: 32.7 g/dL (ref 30.0–36.0)
MCV: 79.7 fL (ref 78.0–100.0)
Platelets: 447 10*3/uL — ABNORMAL HIGH (ref 150–400)
RBC: 4.68 MIL/uL (ref 4.22–5.81)
RDW: 13.5 % (ref 11.5–15.5)
WBC: 8.2 10*3/uL (ref 4.0–10.5)

## 2016-01-21 MED ORDER — TRACE MINERALS CR-CU-MN-SE-ZN 10-1000-500-60 MCG/ML IV SOLN
INTRAVENOUS | Status: AC
  Administered 2016-01-21: 17:00:00 via INTRAVENOUS
  Filled 2016-01-21: qty 1992

## 2016-01-21 MED ORDER — FAT EMULSION 20 % IV EMUL
240.0000 mL | INTRAVENOUS | Status: AC
  Administered 2016-01-21: 240 mL via INTRAVENOUS
  Filled 2016-01-21: qty 250

## 2016-01-21 NOTE — Progress Notes (Signed)
Pt ambulated with this nurse in the hall way standby assist refusing to use assistive device. Gait steady, no noted distress. Pt continues to rate pain 6/10, PCA in place. Wife at bedside. Will continue to monitor.

## 2016-01-21 NOTE — Progress Notes (Signed)
Patient ID: Drew Prince, male   DOB: 07/04/1976, 40 y.o.   MRN: [id number]   Subjective: Drew Prince feels about the same as yesterday; no new pains, no bowel movement yet, but still passing gas. He was able to eat some jello yesterday. He's excited to watch the [place] in the superbowl.  Objective: Vital signs in last 24 hours: Filed Vitals:   01/20/16 2000 01/20/16 2246 01/21/16 0151 01/21/16 0553  BP:  131/83 148/84 134/85  Pulse:  84 80 84  Temp:  98.3 F (36.8 C) 98.1 F (36.7 C) 98.4 F (36.9 C)  TempSrc:  Oral Oral Oral  Resp: Height:      Weight:      SpO2: 100% 100% 99% 100%   Physical exam: General: [name] man resting in bed, appropriately conversational Abd: abdomen with mild distention, tenderness slightly better than previous exams, with some stable ecchymoses in the left lower flank Skin: numerous folliculocentric erythematous papules with subtle scale over buttocks extending over lateral thighs Ext: warm and well perfused, without pedal edema   Lab Results: Basic Metabolic Panel:  Recent Labs Lab 01/16/16 0600  01/20/16 0540 01/21/16 0550  NA 135  < > 138 137  K 4.1  < > 4.2 4.2  CL 99*  < > 99* 98*  CO2 29  < > 31 30  GLUCOSE 143*  < > 109* 121*  BUN 11  < > 12 13  CREATININE 0.64  < > 0.70 0.78  CALCIUM 9.0  < > 9.4 9.1  MG 2.1  --  2.0  --   PHOS 4.6  --  5.4*  --   < > = values in this interval not displayed.   Liver Function Tests:  Recent Labs Lab 01/20/16 0540 01/21/16 0550  AST 115* 77*  ALT 200* 151*  ALKPHOS 133* 124  BILITOT 0.7 0.9  PROT 6.9 6.7  ALBUMIN 3.2* 3.1*   CBC:  Recent Labs Lab 01/20/16 0540 01/21/16 0550  WBC 9.8 8.2  HGB 12.7* 12.2*  HCT 38.2* 37.3*  MCV 79.7 79.7  PLT 480* 447*   Medications: I have reviewed the patient's current medications. Scheduled Meds: . enoxaparin (LOVENOX) injection  40 mg Subcutaneous Q24H  . fluconazole (DIFLUCAN) IV  200 mg Intravenous Q24H  .  HYDROmorphone   Intravenous 6 times per day   Continuous Infusions: . dextrose 5 % and 0.9% NaCl 60 mL/hr at 01/20/16 2008  . Marland KitchenTPN (CLINIMIX-E) Adult 83 mL/hr at 01/20/16 1753   And  . fat emulsion 240 mL (01/20/16 1753)   PRN Meds:.diphenhydrAMINE **OR** diphenhydrAMINE, naloxone **AND** sodium chloride, ondansetron (ZOFRAN) IV, sodium chloride   Assessment/Plan:  Acute pancreatitis with pseudocyst: Remains clinically stable with slow improvement. Imipenem was stopped 1/20. He remains afebrile, and leukocytosis resolved. Liver function enzymes look a little better today too. We will continue to hold the course today with IVFs/TPN and pain control. -Advance diet as tolerated per the patient's ability -Ambulate as much as possible -Continue TPN -Continue PCA -Continue D5 NS at 60 cc/hr -Holding home medications -Will need outpatient cholecystectomy once improved  Majocchi's granuloma: Treating with fluconazole for 4 weeks total. Can change to oral once he can tolerate. -Continue IV fluconazole  daily (stop date 2/19)  Dispo: Disposition is deferred at this time, awaiting improvement of current medical problems.  The patient does have a current PCP [facility]) and does need an [facility] hospital follow-up appointment after discharge.  The patient does not know have transportation limitations that hinder transportation to clinic appointments.  .Services Needed at time of discharge: Y = Yes, Blank = No PT:   OT:   RN:   Equipment:   Other:     LOS: 16 days   Selina Cooley, MD 01/21/2016, 8:37 AM

## 2016-01-21 NOTE — Progress Notes (Signed)
PARENTERAL NUTRITION CONSULT NOTE - FOLLOW UP  Pharmacy Consult for TPN Indication: Pancreatitis  Allergies  Allergen Reactions  . Penicillins Rash    Patient Measurements: Height: _0  (162.6 cm) Weight: 167 lb 15.9 oz (76.2 kg) IBW/kg (Calculated) : 59.2  Vital Signs: Temp: 98.4 F (36.9 C) (01/24 0553) Temp Source: Oral (01/24 0553) BP: 134/85 mmHg (01/24 0553) Pulse Rate: 84 (01/24 0553) Intake/Output from previous day: 01/23 0701 - 01/24 0700 In: -  Out: 1575 [Urine:1575] Intake/Output from this shift:    Labs:  Recent Labs  01/19/16 0443 01/20/16 0540 01/21/16 0550  WBC 10.3 9.8 8.2  HGB 12.7* 12.7* 12.2*  HCT 37.8* 38.2* 37.3*  PLT 496* 480* 447*     Recent Labs  01/19/16 0443 01/20/16 0540 01/21/16 0550  NA 138 138 137  K 4.1 4.2 4.2  CL 100* 99* 98*  CO2 _1 GLUCOSE 118* 109* 121*  BUN _2 CREATININE 0.67 0.70 0.78  CALCIUM 9.2 9.4 9.1  MG  --  2.0  --   PHOS  --  5.4*  --   PROT 6.8 6.9 6.7  ALBUMIN 3.1* 3.2* 3.1*  AST 83* 115* 77*  ALT 130* 200* 151*  ALKPHOS 132* 133* 124  BILITOT 0.7 0.7 0.9  PREALBUMIN  --  26.6  --   TRIG  --  222*  --    Estimated Creatinine Clearance: 114.6 mL/min (by C-G formula based on Cr of 0.78).   No results for input(s): GLUCAP in the last 72 hours.  Medications:  Scheduled:  . enoxaparin (LOVENOX) injection  40 mg Subcutaneous Q24H  . fluconazole (DIFLUCAN) IV  200 mg Intravenous Q24H  . HYDROmorphone   Intravenous 6 times per day    Admit: 40 yo M w/ chief complaint of stomach pain and N/V. Found to have gallstone pancreatitis and ileus. Pharmacy consulted to dose TPN for nutrition support.  Nutritional Goals: per RD 1/20 Kcal: 1900-2100 Protein: 95-110 grams Fluid: 1.9-2.1 L  Current Nutrition:  Clear liquid diet TPN @ 24m/hr + IVFE @ 171mhr. This provides 100 gm protein and 1900 kcals to meet 100% of goals.  D5NS @ 6017mr. This provides 245 kcal  Insulin  Requirements in the past 24 hours:  None  Surgeries/Procedures: Plan for cholecystectomy on another visit once more stable  GI:  Sever acute gallstone pancreatitis complicated by pancreatic necrosis and possible ductal disruption, for cholecystectomy once pancreatitis is better.Prealbumin improved to 26.6. Albumin 3.1. LBM 1/9. GI and nutrition recommended enteral feeding but failed NGT placement 1/17 2nd N/V, & was pulled. Patient had (+)abd pain with po intake, then changed to NPO again. Now trying clear liquids again. Still w/ abdominal pain. (+) flatus. Ate some jello yesterday. Still no BM since 1/10  1/13:  CT (+)sig inc peripancreatic & RP fluid - c/w infarction or early necrosis.  Endo:  no hx DM, all CBGs < 150 w/ TPN at goal, SSI and CBGs stopped.  Lytes:  Lytes wnl and stable. CoCa 9.82. Phos 5.4 and Mg ok on 1/23.  Renal:  SCr wnl, CrCl > 100m17mn. UOP stable at 0.9 ml/kg/hr yesterday. D5NS at 60 ml/hr  Pulm:  [place]-2L  Cards: VSS  Hepatobil:  PMH HLD, on fenofibrate and atorvastatin PTA.  AST/ ALT up to 115/200. Alk Phos 133. TBili ok at 0.7. Triglycerides back up to 222.  Neuro: Patient still complains of LLQ abdominal pain. Dilaudid PCA, 12mg84mr.  ID:  S/p Primaxin for  necrosis with acute pancreatitis. The new CT showed evidence of necrosis in tail of pancreas. AFeb in last 24hr. WBC down to wnl. 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 Monitor TG closely  Continue D5NS at 60 ml/hr to keep total IVF ~150 ml/hr This provides 100g of protein and 2139 kcal which meets 100% of patient needs Continue MVI and trace elements in TPN Monitor TPN labs F/U further toleration of diet  Albertina Parr, PharmD., BCPS Clinical Pharmacist Pager [phone]

## 2016-01-22 LAB — COMPREHENSIVE METABOLIC PANEL
ALBUMIN: 3.2 g/dL — AB (ref 3.5–5.0)
ALT: 114 U/L — ABNORMAL HIGH (ref 17–63)
AST: 47 U/L — AB (ref 15–41)
Alkaline Phosphatase: 119 U/L (ref 38–126)
Anion gap: 11 (ref 5–15)
BILIRUBIN TOTAL: 0.6 mg/dL (ref 0.3–1.2)
BUN: 11 mg/dL (ref 6–20)
CO2: 30 mmol/L (ref 22–32)
Calcium: 9.5 mg/dL (ref 8.9–10.3)
Chloride: 96 mmol/L — ABNORMAL LOW (ref 101–111)
Creatinine, Ser: 0.7 mg/dL (ref 0.61–1.24)
GFR calc Af Amer: >60 mL/min (ref >60)
GFR calc non Af Amer: >60 mL/min (ref >60)
GLUCOSE: 121 mg/dL — AB (ref 65–99)
POTASSIUM: 3.5 mmol/L (ref 3.5–5.1)
Sodium: 137 mmol/L (ref 135–145)
TOTAL PROTEIN: 7 g/dL (ref 6.5–8.1)

## 2016-01-22 MED ORDER — SENNOSIDES-DOCUSATE SODIUM 8.6-50 MG PO TABS: 1.0000 | ORAL_TABLET | Freq: Two times a day (BID) | ORAL | Status: DC

## 2016-01-22 MED ORDER — PROMETHAZINE HCL 25 MG/ML IJ SOLN: 25.0000 mg | Freq: Once | INTRAMUSCULAR | Status: DC

## 2016-01-22 MED ORDER — TRACE MINERALS CR-CU-MN-SE-ZN 10-1000-500-60 MCG/ML IV SOLN
INTRAVENOUS | Status: AC
  Administered 2016-01-22: 18:00:00 via INTRAVENOUS
  Filled 2016-01-22: qty 1992

## 2016-01-22 MED ORDER — FAT EMULSION 20 % IV EMUL
240.0000 mL | INTRAVENOUS | Status: AC
  Administered 2016-01-22: 240 mL via INTRAVENOUS
  Filled 2016-01-22: qty 250

## 2016-01-22 MED ORDER — PROMETHAZINE HCL 25 MG/ML IJ SOLN
25.0000 mg | Freq: Once | INTRAMUSCULAR | Status: AC
  Administered 2016-01-22: 25 mg via INTRAVENOUS
  Filled 2016-01-22: qty 1

## 2016-01-22 MED ORDER — PROMETHAZINE HCL 25 MG/ML IJ SOLN
12.5000 mg | Freq: Once | INTRAMUSCULAR | Status: AC
  Administered 2016-01-22: 12.5 mg via INTRAVENOUS
  Filled 2016-01-22: qty 1

## 2016-01-22 MED ORDER — POLYETHYLENE GLYCOL 3350 17 G PO PACK: 17.0000 g | PACK | Freq: Every day | ORAL | Status: DC

## 2016-01-22 MED ORDER — BISACODYL 10 MG RE SUPP
10.0000 mg | Freq: Once | RECTAL | Status: DC
  Filled 2016-01-22: qty 1

## 2016-01-22 MED ORDER — HYDROMORPHONE 1 MG/ML IV SOLN
INTRAVENOUS | Status: DC
  Administered 2016-01-22: 0.5 mg via INTRAVENOUS
  Administered 2016-01-22: 0.5 mL via INTRAVENOUS
  Administered 2016-01-23: 1 mg via INTRAVENOUS
  Administered 2016-01-23: 0 mg via INTRAVENOUS

## 2016-01-22 NOTE — Progress Notes (Signed)
PARENTERAL NUTRITION CONSULT NOTE - FOLLOW UP  Pharmacy Consult for TPN Indication: Pancreatitis  Allergies  Allergen Reactions  . Penicillins Rash    Patient Measurements: Height: 5' 4" (162.6 cm) Weight: 167 lb 15.9 oz (76.2 kg) IBW/kg (Calculated) : 59.2  Vital Signs: Temp: 98 F (36.7 C) (01/25 0519) Temp Source: Oral (01/25 0519) BP: 124/83 mmHg (01/25 0519) Pulse Rate: 88 (01/25 0519) Intake/Output from previous day: 01/24 0701 - 01/25 0700 In: 120 [P.O.:120] Out: 1150 [Urine:1150] Intake/Output from this shift:    Labs:  Recent Labs  01/20/16 0540 01/21/16 0550  WBC 9.8 8.2  HGB 12.7* 12.2*  HCT 38.2* 37.3*  PLT 480* 447*     Recent Labs  01/20/16 0540 01/21/16 0550  NA 138 137  K 4.2 4.2  CL 99* 98*  CO2 31 30  GLUCOSE 109* 121*  BUN 12 13  CREATININE 0.70 0.78  CALCIUM 9.4 9.1  MG 2.0  --   PHOS 5.4*  --   PROT 6.9 6.7  ALBUMIN 3.2* 3.1*  AST 115* 77*  ALT 200* 151*  ALKPHOS 133* 124  BILITOT 0.7 0.9  PREALBUMIN 26.6  --   TRIG 222*  --    Estimated Creatinine Clearance: 114.6 mL/min (by C-G formula based on Cr of 0.78).   No results for input(s): GLUCAP in the last 72 hours.  Medications:  Scheduled:  . enoxaparin (LOVENOX) injection  40 mg Subcutaneous Q24H  . fluconazole (DIFLUCAN) IV  200 mg Intravenous Q24H  . HYDROmorphone   Intravenous 6 times per day    Admit: 40 yo M w/ chief complaint of stomach pain and N/V. Found to have gallstone pancreatitis and ileus. Pharmacy consulted to dose TPN for nutrition support.  Nutritional Goals: per RD 1/20 Kcal: 1900-2100 Protein: 95-110 grams Fluid: 1.9-2.1 L  Current Nutrition:  Clear liquid diet TPN @ 73m/hr + IVFE @ 152mhr. This provides 100 gm protein and 1900 kcals to meet 100% of goals.  D5NS @ 6028mr. This provides 245 kcal  Insulin Requirements in the past 24 hours:  None  Surgeries/Procedures: Plan for cholecystectomy on another visit once more  stable  GI:  Sever acute gallstone pancreatitis complicated by pancreatic necrosis and possible ductal disruption, for cholecystectomy once pancreatitis is better.Prealbumin improved to 26.6. Albumin 3.1. LBM 1/9. GI and nutrition recommended enteral feeding but failed NGT placement 1/17 2nd N/V, & was pulled. Patient had (+)abd pain with po intake, then changed to NPO again. Now trying clear liquids again. Still w/ abdominal pain. (+) flatus.Still experiencing some N/V with jello. Still no BM since 1/10  1/13:  CT (+)sig inc peripancreatic & RP fluid - c/w infarction or early necrosis.  Endo:  no hx DM, Glucose < 150 w/ TPN at goal, SSI and CBGs stopped.  Lytes:  Lytes wnl and stable. CoCa 9.82. Phos 5.4 and Mg ok on 1/23.  Renal:  SCr wnl, CrCl > 100m43mn. UOP stable at 0.9 ml/kg/hr yesterday. D5NS at 60 ml/hr  Pulm:  [place]-1L  Cards: VSS  Hepatobil:  PMH HLD, on fenofibrate and atorvastatin PTA.  AST/ ALT down to 47/114. Alk Phos wnl. TBili ok at 0.6. Triglycerides back up to 222.  Neuro: Patient still complains of LLQ abdominal pain. Dilaudid PCA, 12mg52mr.  ID:  S/p Primaxin for necrosis with acute pancreatitis. The new CT showed evidence of necrosis in tail of pancreas. AFeb in last 24hr. WBC down to wnl. 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 Monitor TG closely  Continue D5NS at 60 ml/hr to keep total IVF ~150 ml/hr This provides 100g of protein and 2139 kcal which meets 100% of patient needs Continue MVI and trace elements in TPN Monitor TPN labs F/U further toleration of diet  Albertina Parr, PharmD., BCPS Clinical Pharmacist Pager [phone]

## 2016-01-22 NOTE — Progress Notes (Signed)
Patient ID: Drew Prince, male   DOB: 09-23-1976, 40 y.o.   MRN: [id number]   Subjective: Drew Prince pain is the same as yesterday. Still no bowel movements but he is passing gas. He was able to keep down some jello yesterday and walk around the hall. He's having some nausea this morning so I told him to take it easy on the jello and gave him some extra Zofran. We're going to slowly wean his Dilaudid PCA; he was agreeable and told me he would let me know if we need to tweak it.  Objective: Vital signs in last 24 hours: Filed Vitals:   01/22/16 0254 01/22/16 0519 01/22/16 0800 01/22/16 0911  BP:  124/83  142/83  Pulse:  88  85  Temp:  98 F (36.7 C)  98 F (36.7 C)  TempSrc:  Oral  Oral  Resp: Height:      Weight:      SpO2: 98% 99% 99% 97%   Physical exam: General: [name] man resting in bed, appropriately conversational Abd: abdomen with mild distention, tenderness most in the left lower quadrant, unchanged from yesterday Skin: numerous folliculocentric erythematous papules with subtle scale over buttocks extending over lateral thighs Ext: warm and well perfused, without pedal edema   Lab Results: Basic Metabolic Panel:  Recent Labs Lab 01/16/16 0600  01/20/16 0540 01/21/16 0550 01/22/16 0741  NA 135  < > 138 137 137  K 4.1  < > 4.2 4.2 3.5  CL 99*  < > 99* 98* 96*  CO2 29  < > GLUCOSE 143*  < > 109* 121* 121*  BUN 11  < > CREATININE 0.64  < > 0.70 0.78 0.70  CALCIUM 9.0  < > 9.4 9.1 9.5  MG 2.1  --  2.0  --   --   PHOS 4.6  --  5.4*  --   --   < > = values in this interval not displayed. Liver Function Tests:  Recent Labs Lab 01/21/16 0550 01/22/16 0741  AST 77* 47*  ALT 151* 114*  ALKPHOS 124 119  BILITOT 0.9 0.6  PROT 6.7 7.0  ALBUMIN 3.1* 3.2*   CBC:  Recent Labs Lab 01/20/16 0540 01/21/16 0550  WBC 9.8 8.2  HGB 12.7* 12.2*  HCT 38.2* 37.3*  MCV 79.7 79.7  PLT 480* 447*   Medications: I have reviewed  the patient's current medications. Scheduled Meds: . enoxaparin (LOVENOX) injection  40 mg Subcutaneous Q24H  . fluconazole (DIFLUCAN) IV  200 mg Intravenous Q24H  . HYDROmorphone   Intravenous 6 times per day   Continuous Infusions: . dextrose 5 % and 0.9% NaCl 60 mL/hr at 01/20/16 2008  . Marland KitchenTPN (CLINIMIX-E) Adult 83 mL/hr at 01/21/16 1721   And  . fat emulsion 240 mL (01/21/16 1721)   PRN Meds:.diphenhydrAMINE **OR** diphenhydrAMINE, naloxone **AND** sodium chloride, ondansetron (ZOFRAN) IV, sodium chloride   Assessment/Plan:  Acute pancreatitis with pseudocyst: Remains clinically stable with slow improvement. His labs look a bit little better and his leukocytosis has resolved. Will going to start to slowly down titrate his Dilaudid PCA pump to prevent dependence and ileus. -Lowered PCA bolus dose and basal drip -Advance diet as tolerated per the patient's ability -Ambulate as much as possible -Continue TPN -Continue D5 NS at 60 cc/hr -Holding home medications -Will need outpatient cholecystectomy once improved  Majocchi's granuloma: Treating with fluconazole for 4 weeks total. -Continue IV  fluconazole  daily (stop date 2/19) -Can change to oral once he can swallow pills  Dispo: Disposition is deferred at this time, awaiting improvement of current medical problems.    The patient does have a current PCP [facility]) and does need an [facility] hospital follow-up appointment after discharge.  The patient does not know have transportation limitations that hinder transportation to clinic appointments.  .Services Needed at time of discharge: Y = Yes, Blank = No PT:   OT:   RN:   Equipment:   Other:     LOS: 17 days   Selina Cooley, MD 01/22/2016, 10:51 AM

## 2016-01-23 ENCOUNTER — Inpatient Hospital Stay (HOSPITAL_COMMUNITY): Payer: BLUE CROSS/BLUE SHIELD

## 2016-01-23 DIAGNOSIS — R112 Nausea with vomiting, unspecified: Secondary | ICD-10-CM

## 2016-01-23 DIAGNOSIS — L988 Other specified disorders of the skin and subcutaneous tissue: Secondary | ICD-10-CM

## 2016-01-23 LAB — COMPREHENSIVE METABOLIC PANEL
ALK PHOS: 122 U/L (ref 38–126)
ALT: 109 U/L — AB (ref 17–63)
AST: 47 U/L — ABNORMAL HIGH (ref 15–41)
Albumin: 3.2 g/dL — ABNORMAL LOW (ref 3.5–5.0)
Anion gap: 10 (ref 5–15)
BILIRUBIN TOTAL: 0.6 mg/dL (ref 0.3–1.2)
BUN: 11 mg/dL (ref 6–20)
CALCIUM: 9.3 mg/dL (ref 8.9–10.3)
CO2: 27 mmol/L (ref 22–32)
CREATININE: 0.74 mg/dL (ref 0.61–1.24)
Chloride: 101 mmol/L (ref 101–111)
Glucose, Bld: 154 mg/dL — ABNORMAL HIGH (ref 65–99)
Potassium: 3.5 mmol/L (ref 3.5–5.1)
Sodium: 138 mmol/L (ref 135–145)
Total Protein: 7.3 g/dL (ref 6.5–8.1)

## 2016-01-23 LAB — MAGNESIUM: MAGNESIUM: 1.7 mg/dL (ref 1.7–2.4)

## 2016-01-23 LAB — PHOSPHORUS: PHOSPHORUS: 3 mg/dL (ref 2.5–4.6)

## 2016-01-23 MED ORDER — FAT EMULSION 20 % IV EMUL
240.0000 mL | INTRAVENOUS | Status: AC
  Administered 2016-01-23: 240 mL via INTRAVENOUS
  Filled 2016-01-23: qty 250

## 2016-01-23 MED ORDER — M.V.I. ADULT IV INJ
INJECTION | INTRAVENOUS | Status: AC
  Administered 2016-01-23: 17:00:00 via INTRAVENOUS
  Filled 2016-01-23: qty 1992

## 2016-01-23 MED ORDER — PROCHLORPERAZINE EDISYLATE 5 MG/ML IJ SOLN
5.0000 mg | Freq: Two times a day (BID) | INTRAMUSCULAR | Status: DC | PRN
  Administered 2016-01-24: 5 mg via INTRAVENOUS
  Filled 2016-01-23 (×2): qty 1

## 2016-01-23 MED ORDER — MAGNESIUM SULFATE IN D5W 10-5 MG/ML-% IV SOLN
1.0000 g | Freq: Once | INTRAVENOUS | Status: AC
  Administered 2016-01-23: 1 g via INTRAVENOUS
  Filled 2016-01-23: qty 100

## 2016-01-23 MED ORDER — HYDROMORPHONE 1 MG/ML IV SOLN
INTRAVENOUS | Status: DC
  Administered 2016-01-23: 0.4 mg via INTRAVENOUS
  Administered 2016-01-23: 0.2 mg via INTRAVENOUS
  Administered 2016-01-23: 1.69 mg via INTRAVENOUS
  Administered 2016-01-24 (×2): 0.4 mg via INTRAVENOUS

## 2016-01-23 MED ORDER — PROMETHAZINE HCL 25 MG/ML IJ SOLN
12.5000 mg | INTRAMUSCULAR | Status: DC | PRN
  Administered 2016-01-23 – 2016-01-24 (×7): 25 mg via INTRAVENOUS
  Filled 2016-01-23 (×7): qty 1

## 2016-01-23 MED ORDER — POLYETHYLENE GLYCOL 3350 17 G PO PACK
17.0000 g | PACK | Freq: Every day | ORAL | Status: DC
  Administered 2016-01-27 – 2016-01-30 (×3): 17 g via ORAL
  Filled 2016-01-23 (×4): qty 1

## 2016-01-23 MED ORDER — POTASSIUM CHLORIDE 10 MEQ/50ML IV SOLN
10.0000 meq | INTRAVENOUS | Status: AC
  Administered 2016-01-23 (×3): 10 meq via INTRAVENOUS
  Filled 2016-01-23 (×3): qty 50

## 2016-01-23 MED ORDER — SENNOSIDES-DOCUSATE SODIUM 8.6-50 MG PO TABS
1.0000 | ORAL_TABLET | Freq: Two times a day (BID) | ORAL | Status: DC
  Administered 2016-01-24 – 2016-01-30 (×8): 1 via ORAL
  Filled 2016-01-23 (×10): qty 1

## 2016-01-23 NOTE — Progress Notes (Signed)
Patient ID: Drew Prince, male   DOB: [birth year], 40 y.o.   MRN: [id number]   Subjective: [name] was quite nauseous and feeling unwell overnight. He denies any worsening abdominal pain. He's in low spirits this morning. I explained this is probably mostly opiate withdrawal; today will be rough because I turned his PCA settings down but he'll likely start feeling better this afternoon.  Objective: Vital signs in last 24 hours: Filed Vitals:   01/23/16 0357 01/23/16 0700 01/23/16 0805 01/23/16 1016  BP:  155/90  150/79  Pulse:  97  95  Temp:  98 F (36.7 C)  97.9 F (36.6 C)  TempSrc:  Oral  Oral  Resp: Height:      Weight:      SpO2: 97% 100% 99% 98%   Physical exam: General: [name] man appearing more lethargic today, but conversational Cardiac: regular rate and rhythm, no rubs, murmurs or gallops Pulm: breathing well, clear to auscultation bilaterally Abd: bowel sounds normal, non-distended, still tender in the left lower quadrant Skin: goosebumps on arms Ext: warm and well perfused, without pedal edema  Medications: I have reviewed the patient's current medications. Scheduled Meds: . bisacodyl  10 mg Rectal Once  . enoxaparin (LOVENOX) injection  40 mg Subcutaneous Q24H  . fluconazole (DIFLUCAN) IV  200 mg Intravenous Q24H  . HYDROmorphone   Intravenous 6 times per day  . polyethylene glycol  17 g Oral Daily  . senna-docusate  1 tablet Oral BID   Continuous Infusions: . dextrose 5 % and 0.9% NaCl 60 mL/hr at 01/23/16 0221  . Marland KitchenTPN (CLINIMIX-E) Adult 83 mL/hr at 01/22/16 1803   And  . fat emulsion 240 mL (01/22/16 1803)  . Marland KitchenTPN (CLINIMIX-E) Adult     And  . fat emulsion     PRN Meds:.diphenhydrAMINE **OR** diphenhydrAMINE, naloxone **AND** sodium chloride, ondansetron (ZOFRAN) IV, promethazine, sodium chloride   Assessment/Plan:  Acute pancreatitis with necrosis and pseudocyst: Remains clinically stable with slow improvement. I slowly down  titrated his Dilaudid PCA pump to prevent dependence and ileus. -Lowered PCA basal drip -Advance diet as tolerated per the patient's ability -Ambulate as much as possible -Continue TPN -Continue D5 NS at 60 cc/hr -Holding home medications -Will need outpatient cholecystectomy once improved  Opiate withdrawal: His nausea and goosebumps are likely opiate withdrawal. I've dropped his basal infusion rate a touch today. We can keep giving him the promethazine for his nausea. -Continue promethazine 12.5-25mg  every 4 hours for nausea  Majocchi's granuloma: Treating with fluconazole for 4 weeks total. -Continue IV fluconazole  daily (stop date 2/19) -Can change to oral once he can swallow pills  Dispo: Disposition is deferred at this time, awaiting improvement of current medical problems.  The patient does have a current PCP [facility]) and does need an [facility] hospital follow-up appointment after discharge.  The patient does have transportation limitations that hinder transportation to clinic appointments.  .Services Needed at time of discharge: Y = Yes, Blank = No PT:   OT:   RN:   Equipment:   Other:     LOS: 18 days   Selina Cooley, MD 01/23/2016, 10:59 AM

## 2016-01-23 NOTE — Progress Notes (Signed)
PARENTERAL NUTRITION CONSULT NOTE - FOLLOW UP  Pharmacy Consult for TPN Indication: Pancreatitis  Allergies  Allergen Reactions  . Penicillins Rash    Patient Measurements: Height: '5\' 4"'$  (162.6 cm) Weight: 167 lb 15.9 oz (76.2 kg) IBW/kg (Calculated) : 59.2  Vital Signs: Temp: 98 F (36.7 C) (01/26 0700) Temp Source: Oral (01/26 0700) BP: 155/90 mmHg (01/26 0700) Pulse Rate: 97 (01/26 0700) Intake/Output from previous day: 01/25 0701 - 01/26 0700 In: 10 [I.V.:10] Out: 550 [Urine:550] Intake/Output from this shift:    Labs:  Recent Labs  01/21/16 0550  WBC 8.2  HGB 12.2*  HCT 37.3*  PLT 447*     Recent Labs  01/21/16 0550 01/22/16 0741  NA 137 137  K 4.2 3.5  CL 98* 96*  CO2 30 30  GLUCOSE 121* 121*  BUN 13 11  CREATININE 0.78 0.70  CALCIUM 9.1 9.5  PROT 6.7 7.0  ALBUMIN 3.1* 3.2*  AST 77* 47*  ALT 151* 114*  ALKPHOS 124 119  BILITOT 0.9 0.6   Estimated Creatinine Clearance: 114.6 mL/min (by C-G formula based on Cr of 0.7).   No results for input(s): GLUCAP in the last 72 hours.   Admit: 40 yo M w/ chief complaint of stomach pain and N/V. Found to have gallstone pancreatitis and ileus. Pharmacy consulted to dose TPN for nutrition support.  Nutritional Goals: per RD 1/20 Kcal: 1900-2100 Protein: 95-110 grams Fluid: 1.9-2.1 L  Current Nutrition:  Clear liquid diet - zero PO intake recorded in EPIC TPN @ 69m/hr + IVFE @ 163mhr. This provides 100 gm protein and 1900 kcals to meet 100% of goals.  D5NS @ 607mr. This provides 245 kcal  Insulin Requirements in the past 24 hours:  None  Surgeries/Procedures: Plan for cholecystectomy on another visit once more stable  GI:  Sever acute gallstone pancreatitis complicated by pancreatic necrosis and possible ductal disruption, for cholecystectomy once pancreatitis is better.Prealbumin improved to 26.6. Albumin 3.1. GI and nutrition recommended enteral feeding but failed NGT placement 1/17  2nd N/V, & was pulled. Patient had (+)abd pain with po intake, then changed to NPO again. Now  clear liquids ordered but zero intake recorded. Still no BM since 1/10  1/13:  CT (+)sig inc peripancreatic & RP fluid - c/w infarction or early necrosis.  Endo:  no hx DM, Glucose < 150 w/ TPN at goal, SSI and CBGs stopped.  Lytes:  K 3.5 phos 3 mag 1.7  Renal:  SCr wnl, CrCl > 100m79mn.  D5NS at 60 ml/hr  Pulm:  [place]-1L  Cards: VSS  Hepatobil:  PMH HLD, on fenofibrate and atorvastatin PTA.  AST/ ALT down to 47/114. Alk Phos wnl. TBili ok at 0.6. Triglycerides back up to 222.  Neuro: Dilaudid PCA,   ID:  S/p Primaxin for necrosis with acute pancreatitis. The new CT showed evidence of necrosis in tail of pancreas. AFeb in last 24hr. WBC down to wnl. Also on fluconazole x 4 weeks for majocchi's granuloma   1/13 Imipenem >> 1/20 1/19 fluconazole>>  Best Practices: enoxaparin  TPN Access: PICC placed 1/13  TPN start date: 1/13 >>  Plan:  Continue Clinimix E5/15 at 83 ml/hr Continue IVFE at 10 ml/hr. Monitor TG closely  Continue D5NS at 60 ml/hr to keep total IVF ~150 ml/hr This provides 100g of protein and 2139 kcal which meets 100% of patient needs - mag 1 gm f/u by K 3 runs and recheck in AM Continue MVI and trace elements in TPN F/U  further toleration of diet  Eudelia Bunch, Pharm.D. [phone] 01/23/2016 9:20 AM

## 2016-01-24 DIAGNOSIS — K8581 Other acute pancreatitis with uninfected necrosis: Secondary | ICD-10-CM

## 2016-01-24 DIAGNOSIS — R1032 Left lower quadrant pain: Secondary | ICD-10-CM

## 2016-01-24 LAB — COMPREHENSIVE METABOLIC PANEL
ALT: 90 U/L — ABNORMAL HIGH (ref 17–63)
ANION GAP: 9 (ref 5–15)
AST: 42 U/L — ABNORMAL HIGH (ref 15–41)
Albumin: 3.2 g/dL — ABNORMAL LOW (ref 3.5–5.0)
Alkaline Phosphatase: 104 U/L (ref 38–126)
BUN: 12 mg/dL (ref 6–20)
CHLORIDE: 102 mmol/L (ref 101–111)
CO2: 30 mmol/L (ref 22–32)
Calcium: 9.2 mg/dL (ref 8.9–10.3)
Creatinine, Ser: 0.81 mg/dL (ref 0.61–1.24)
Glucose, Bld: 153 mg/dL — ABNORMAL HIGH (ref 65–99)
POTASSIUM: 3.3 mmol/L — AB (ref 3.5–5.1)
Sodium: 141 mmol/L (ref 135–145)
Total Bilirubin: 0.7 mg/dL (ref 0.3–1.2)
Total Protein: 7.3 g/dL (ref 6.5–8.1)

## 2016-01-24 LAB — MAGNESIUM: MAGNESIUM: 2 mg/dL (ref 1.7–2.4)

## 2016-01-24 MED ORDER — FAT EMULSION 20 % IV EMUL
240.0000 mL | INTRAVENOUS | Status: AC
  Administered 2016-01-24: 240 mL via INTRAVENOUS
  Filled 2016-01-24: qty 250

## 2016-01-24 MED ORDER — PROMETHAZINE HCL 25 MG/ML IJ SOLN: 12.5000 mg | INTRAMUSCULAR | Status: DC | PRN

## 2016-01-24 MED ORDER — TRACE MINERALS CR-CU-MN-SE-ZN 10-1000-500-60 MCG/ML IV SOLN
INTRAVENOUS | Status: AC
  Administered 2016-01-24: 18:00:00 via INTRAVENOUS
  Filled 2016-01-24: qty 1992

## 2016-01-24 MED ORDER — PROCHLORPERAZINE EDISYLATE 5 MG/ML IJ SOLN
5.0000 mg | INTRAMUSCULAR | Status: DC | PRN
  Administered 2016-01-24 – 2016-01-25 (×5): 10 mg via INTRAVENOUS
  Filled 2016-01-24 (×7): qty 2

## 2016-01-24 MED ORDER — POTASSIUM CHLORIDE 10 MEQ/50ML IV SOLN
10.0000 meq | INTRAVENOUS | Status: AC
  Administered 2016-01-24 (×4): 10 meq via INTRAVENOUS
  Filled 2016-01-24 (×4): qty 50

## 2016-01-24 MED ORDER — ONDANSETRON HCL 40 MG/20ML IJ SOLN
8.0000 mg | Freq: Four times a day (QID) | INTRAMUSCULAR | Status: DC | PRN
Start: 2016-01-24 — End: 2016-01-25
  Administered 2016-01-24 – 2016-01-25 (×2): 8 mg via INTRAVENOUS
  Filled 2016-01-24 (×6): qty 4

## 2016-01-24 NOTE — Progress Notes (Signed)
Patient ID: Drew Prince, male   DOB: 06/07/1976, 40 y.o.   MRN: [id number]   Subjective: He continues to feel nauseous but his abdominal pain remains unchanged. He did have a bowel movement yesterday.  Objective: Vital signs in last 24 hours: Filed Vitals:   01/24/16 0207 01/24/16 0424 01/24/16 0540 01/24/16 1054  BP: 139/84  143/84 154/88  Pulse: 93  87 93  Temp: 99 F (37.2 C)  98.5 F (36.9 C) 98.9 F (37.2 C)  TempSrc: Oral  Oral Oral  Resp: Height:      Weight:      SpO2: 94% 100% 99% 100%   Physical exam: General: [name] man appearing more lethargic today, but conversational Cardiac: regular rate and rhythm, no rubs, murmurs or gallops Pulm: breathing well, clear to auscultation bilaterally Abd: bowel sounds normal, non-distended, still tender in the left lower quadrant Skin: goosebumps on arms Ext: warm and well perfused, without pedal edema  Lab Results: Basic Metabolic Panel:  Recent Labs Lab 01/20/16 0540  01/23/16 1045 01/24/16 0330  NA 138  < > 138 141  K 4.2  < > 3.5 3.3*  CL 99*  < > 101 102  CO2 31  < > 27 30  GLUCOSE 109*  < > 154* 153*  BUN 12  < > 11 12  CREATININE 0.70  < > 0.74 0.81  CALCIUM 9.4  < > 9.3 9.2  MG 2.0  --  1.7 2.0  PHOS 5.4*  --  3.0  --   < > = values in this interval not displayed.   Liver Function Tests:  Recent Labs Lab 01/23/16 1045 01/24/16 0330  AST 47* 42*  ALT 109* 90*  ALKPHOS 122 104  BILITOT 0.6 0.7  PROT 7.3 7.3  ALBUMIN 3.2* 3.2*   Studies/Results: Dg Abd Portable 1v  01/24/2016  CLINICAL DATA:  Acute onset of vomiting.  Initial encounter. EXAM: PORTABLE ABDOMEN - 1 VIEW COMPARISON:  MRI/MRA of the abdomen performed 01/16/2016 FINDINGS: The visualized bowel gas pattern is unremarkable. Scattered air and stool filled loops of colon are seen; no abnormal dilatation of small bowel loops is seen to suggest small bowel obstruction. No free intra-abdominal air is identified, though evaluation  for free air is limited on a single supine view. A small to moderate amount of air is noted within the stomach. The visualized osseous structures are within normal limits; the sacroiliac joints are unremarkable in appearance. IMPRESSION: Unremarkable bowel gas pattern; no free intra-abdominal air seen. Electronically Signed   By: Roanna Raider M.D.   On: 01/24/2016 03:00   Medications: I have reviewed the patient's current medications. Scheduled Meds: . enoxaparin (LOVENOX) injection  40 mg Subcutaneous Q24H  . fluconazole (DIFLUCAN) IV  200 mg Intravenous Q24H  . HYDROmorphone   Intravenous 6 times per day  . polyethylene glycol  17 g Oral Daily  . senna-docusate  1 tablet Oral BID   Continuous Infusions: . dextrose 5 % and 0.9% NaCl 60 mL/hr at 01/24/16 0046  . Marland KitchenTPN (CLINIMIX-E) Adult 83 mL/hr at 01/23/16 1704   And  . fat emulsion 240 mL (01/23/16 1705)  . Marland KitchenTPN (CLINIMIX-E) Adult     And  . fat emulsion     PRN Meds:.diphenhydrAMINE **OR** diphenhydrAMINE, naloxone **AND** sodium chloride, ondansetron (ZOFRAN) IV, promethazine, sodium chloride   Assessment/Plan:  Acute pancreatitis with necrosis and pseudocyst: Remains clinically stable with very slow improvement. He has been quite nauseous for the  last 2 days but his abdominal pain is stable and his exam is unchanged. I've gone up on his ondansetron dose. I down titrated his Dilaudid PCA pump settings yesterday to prevent dependence and ileus. -Increase ondansetron to  every 4 hours as needed for nausea -Advance diet as tolerated per the patient's ability -Ambulate as much as possible to prevent pulmonary embolus -Continue PCA -Continue TPN -Continue D5 NS at 60 cc/hr -Holding home medications -Will need outpatient cholecystectomy once improved  Majocchi's granuloma: Treating with fluconazole for 4 weeks total. -Continue IV fluconazole  daily (stop date 2/19) -Can change to oral once he can swallow pills  Dispo:  Disposition is deferred at this time, awaiting improvement of current medical problems.  The patient does have a current PCP [facility]) and does need an [facility] hospital follow-up appointment after discharge.  The patient does not know have transportation limitations that hinder transportation to clinic appointments.  .Services Needed at time of discharge: Y = Yes, Blank = No PT:   OT:   RN:   Equipment:   Other:     LOS: 19 days   Selina Cooley, MD 01/24/2016, 1:16 PM

## 2016-01-24 NOTE — Progress Notes (Signed)
PARENTERAL NUTRITION CONSULT NOTE - FOLLOW UP  Pharmacy Consult for TPN Indication: Pancreatitis  Allergies  Allergen Reactions  . Penicillins Rash    Patient Measurements: Height: '5\' 4"'$  (162.6 cm) Weight: 167 lb 15.9 oz (76.2 kg) IBW/kg (Calculated) : 59.2  Vital Signs: Temp: 98.5 F (36.9 C) (01/27 0540) Temp Source: Oral (01/27 0540) BP: 143/84 mmHg (01/27 0540) Pulse Rate: 87 (01/27 0540) Intake/Output from previous day:   Intake/Output from this shift:    Labs: No results for input(s): WBC, HGB, HCT, PLT, APTT, INR in the last 72 hours.   Recent Labs  01/22/16 0741 01/23/16 1045 01/24/16 0330  NA 137 138 141  K 3.5 3.5 3.3*  CL 96* 101 102  CO2 '30 27 30  '$ GLUCOSE 121* 154* 153*  BUN '11 11 12  '$ CREATININE 0.70 0.74 0.81  CALCIUM 9.5 9.3 9.2  MG  --  1.7 2.0  PHOS  --  3.0  --   PROT 7.0 7.3 7.3  ALBUMIN 3.2* 3.2* 3.2*  AST 47* 47* 42*  ALT 114* 109* 90*  ALKPHOS 119 122 104  BILITOT 0.6 0.6 0.7   Estimated Creatinine Clearance: 113.2 mL/min (by C-G formula based on Cr of 0.81).   No results for input(s): GLUCAP in the last 72 hours.   Admit: 40 yo M w/ chief complaint of stomach pain and N/V. Found to have gallstone pancreatitis and ileus. Pharmacy consulted to dose TPN for nutrition support.  Nutritional Goals: per RD 1/20 Kcal: 1900-2100 Protein: 95-110 grams Fluid: 1.9-2.1 L  Current Nutrition:  Clear liquid diet - zero PO intake recorded in EPIC TPN @ 46m/hr + IVFE @ 115mhr. This provides 100 gm protein and 1900 kcals to meet 100% of goals.  D5NS @ 6041mr. This provides 245 kcal  Insulin Requirements in the past 24 hours:  None  Surgeries/Procedures: Plan for cholecystectomy on another visit once more stable  GI:  Sever acute gallstone pancreatitis complicated by pancreatic necrosis and possible ductal disruption, for cholecystectomy once pancreatitis is better.Prealbumin improved to 26.6. Albumin 3.2. GI and nutrition  recommended enteral feeding but failed NGT placement 1/17 2nd N/V, & was pulled. Patient had (+)abd pain with po intake, then changed to NPO again. Now  clear liquids ordered but zero intake recorded. Still no BM since 1/10  1/13:  CT (+)sig inc peripancreatic & RP fluid - c/w infarction or early necrosis.  Endo:  no hx DM, Glucose 153 w/ TPN at goal, SSI and CBGs stopped.  Lytes:  K 3.3, phos 3, mag 2  Renal:  SCr wnl, CrCl > 100m10mn.  D5NS at 60 ml/hr  Pulm:  [place]-1L  Cards: VSS  Hepatobil:  PMH HLD, on fenofibrate and atorvastatin PTA.  AST/ ALT down to 47/114. Alk Phos wnl. TBili ok at 0.6. Triglycerides back up to 222.  Neuro: Dilaudid PCA - titrated down to prevent dependence and ileus,   ID:  S/p Primaxin for necrosis with acute pancreatitis. The new CT showed evidence of necrosis in tail of pancreas. AFeb in last 24hr. WBC down to wnl. Also on fluconazole x 4 weeks for majocchi's granuloma   1/13 Imipenem >> 1/20 1/19 fluconazole>>  Best Practices: enoxaparin  TPN Access: PICC placed 1/13  TPN start date: 1/13 >>  Plan:  -Continue Clinimix E5/15 at 83 ml/hr -Continue IVFE at 10 ml/hr. Monitor TG closely  -Continue D5NS at 60 ml/hr to keep total IVF ~150 ml/hr -This provides 100g of protein and 2139 kcal which meets  100% of patient needs -Give 4 KCl run  -Continue MVI and trace elements in TPN -F/U further toleration of diet  Albertina Parr, PharmD., BCPS Clinical Pharmacist Phone [phone]

## 2016-01-24 NOTE — Progress Notes (Signed)
Md notified that patient continues to have severe nausea and vomiting. Zofran and Phenergan given with only temporary relief.  Md placed order for Compazine  IV and KUB. Md also encouraged pt to use Dilaudid PCA to help relieve nausea because nausea could be related to withdrawel from decrease in Dilaudid dose. RN will continue to monitor.

## 2016-01-25 ENCOUNTER — Encounter (HOSPITAL_COMMUNITY): Payer: Self-pay | Admitting: *Deleted

## 2016-01-25 ENCOUNTER — Inpatient Hospital Stay (HOSPITAL_COMMUNITY): Payer: BLUE CROSS/BLUE SHIELD

## 2016-01-25 LAB — COMPREHENSIVE METABOLIC PANEL
ALBUMIN: 3.2 g/dL — AB (ref 3.5–5.0)
ALK PHOS: 113 U/L (ref 38–126)
ALT: 251 U/L — AB (ref 17–63)
AST: 157 U/L — AB (ref 15–41)
Anion gap: 10 (ref 5–15)
BUN: 12 mg/dL (ref 6–20)
CALCIUM: 9.3 mg/dL (ref 8.9–10.3)
CHLORIDE: 102 mmol/L (ref 101–111)
CO2: 30 mmol/L (ref 22–32)
CREATININE: 0.82 mg/dL (ref 0.61–1.24)
GFR calc non Af Amer: >60 mL/min (ref >60)
GLUCOSE: 139 mg/dL — AB (ref 65–99)
Potassium: 3.7 mmol/L (ref 3.5–5.1)
SODIUM: 142 mmol/L (ref 135–145)
Total Bilirubin: 0.8 mg/dL (ref 0.3–1.2)
Total Protein: 7.1 g/dL (ref 6.5–8.1)

## 2016-01-25 LAB — CBC
HCT: 38.9 % — ABNORMAL LOW (ref 39.0–52.0)
Hemoglobin: 13.1 g/dL (ref 13.0–17.0)
MCH: 26.9 pg (ref 26.0–34.0)
MCHC: 33.7 g/dL (ref 30.0–36.0)
MCV: 79.9 fL (ref 78.0–100.0)
PLATELETS: 478 10*3/uL — AB (ref 150–400)
RBC: 4.87 MIL/uL (ref 4.22–5.81)
RDW: 14.1 % (ref 11.5–15.5)
WBC: 15.2 10*3/uL — ABNORMAL HIGH (ref 4.0–10.5)

## 2016-01-25 MED ORDER — IOHEXOL 300 MG/ML  SOLN
100.0000 mL | Freq: Once | INTRAMUSCULAR | Status: AC | PRN
  Administered 2016-01-25: 100 mL via INTRAVENOUS

## 2016-01-25 MED ORDER — FAT EMULSION 20 % IV EMUL
240.0000 mL | INTRAVENOUS | Status: AC
  Administered 2016-01-25: 240 mL via INTRAVENOUS
  Filled 2016-01-25: qty 250

## 2016-01-25 MED ORDER — ONDANSETRON HCL 4 MG/2ML IJ SOLN
4.0000 mg | Freq: Four times a day (QID) | INTRAMUSCULAR | Status: DC | PRN
  Administered 2016-01-25 (×2): 4 mg via INTRAVENOUS
  Filled 2016-01-25: qty 4
  Filled 2016-01-25: qty 2

## 2016-01-25 MED ORDER — TRACE MINERALS CR-CU-MN-SE-ZN 10-1000-500-60 MCG/ML IV SOLN
INTRAVENOUS | Status: AC
  Administered 2016-01-25: 18:00:00 via INTRAVENOUS
  Filled 2016-01-25: qty 1992

## 2016-01-25 MED ORDER — PROMETHAZINE HCL 25 MG/ML IJ SOLN
12.5000 mg | Freq: Four times a day (QID) | INTRAMUSCULAR | Status: DC | PRN
  Administered 2016-01-25 – 2016-01-27 (×6): 25 mg via INTRAVENOUS
  Administered 2016-01-27: 12.5 mg via INTRAVENOUS
  Filled 2016-01-25 (×7): qty 1

## 2016-01-25 MED ORDER — PROMETHAZINE HCL 25 MG/ML IJ SOLN
25.0000 mg | Freq: Four times a day (QID) | INTRAMUSCULAR | Status: DC | PRN
  Administered 2016-01-25: 25 mg via INTRAVENOUS
  Filled 2016-01-25: qty 1

## 2016-01-25 MED ORDER — PROMETHAZINE HCL 25 MG PO TABS: 25.0000 mg | ORAL_TABLET | ORAL | Status: DC | PRN

## 2016-01-25 NOTE — Progress Notes (Signed)
Patient ID: Drew Prince, male   DOB: 07/30/1976, 40 y.o.   MRN: [id number]   Subjective: Drew Prince continues to have severe nausea and vomiting. His abdominal pain has actually improved a touch from yesterday but is still severe in the left lower quadrant.  Objective: Vital signs in last 24 hours: Filed Vitals:   01/25/16 0217 01/25/16 0404 01/25/16 0515 01/25/16 0757  BP: 152/89  154/93   Pulse: 90  91   Temp: 98.2 F (36.8 C)  98.7 F (37.1 C)   TempSrc: Oral  Oral   Resp: _0 Height:      Weight:      SpO2: 97% 98% 99% 99%   Physical exam: General: Drew Prince appearing lethargic but conversational Cardiac: regular rate and rhythm, no rubs, murmurs or gallops Pulm: breathing well, diminished on the right base Abd: bowel sounds normal, non-distended, still tender in the left lower quadrant Ext: warm and well perfused, without pedal edema  Lab Results: Basic Metabolic Panel:  Recent Labs Lab 01/20/16 0540  01/23/16 1045 01/24/16 0330 01/25/16 0500  NA 138  < > 138 141 142  K 4.2  < > 3.5 3.3* 3.7  CL 99*  < > 101 102 102  CO2 31  < > _1 GLUCOSE 109*  < > 154* 153* 139*  BUN 12  < > _2 CREATININE 0.70  < > 0.74 0.81 0.82  CALCIUM 9.4  < > 9.3 9.2 9.3  MG 2.0  --  1.7 2.0  --   PHOS 5.4*  --  3.0  --   --   < > = values in this interval not displayed.   Liver Function Tests:  Recent Labs Lab 01/24/16 0330 01/25/16 0500  AST 42* 157*  ALT 90* 251*  ALKPHOS 104 113  BILITOT 0.7 0.8  PROT 7.3 7.1  ALBUMIN 3.2* 3.2*   CBC:  Recent Labs Lab 01/21/16 0550 01/25/16 0500  WBC 8.2 15.2*  HGB 12.2* 13.1  HCT 37.3* 38.9*  MCV 79.7 79.9  PLT 447* 478*   Medications: I have reviewed the patient's current medications. Scheduled Meds: . enoxaparin (LOVENOX) injection  40 mg Subcutaneous Q24H  . fluconazole (DIFLUCAN) IV  200 mg Intravenous Q24H  . HYDROmorphone   Intravenous 6 times per day  . polyethylene glycol  17 g Oral  Daily  . senna-docusate  1 tablet Oral BID   Continuous Infusions: . dextrose 5 % and 0.9% NaCl 60 mL/hr at 01/24/16 2013  . Marland KitchenTPN (CLINIMIX-E) Adult 83 mL/hr at 01/24/16 1747   And  . fat emulsion 240 mL (01/24/16 1747)   PRN Meds:.diphenhydrAMINE **OR** diphenhydrAMINE, naloxone **AND** sodium chloride, ondansetron (ZOFRAN) IV, prochlorperazine, promethazine, sodium chloride   Assessment/Plan:  Acute pancreatitis with necrosis and pseudocyst: His nausea continues to worsen; he was tachycardic overnight, his white count jumped from 8 to 15 since 3 days ago, and he has a significant jump in AST and ALT with normal bilirubin and alk-phosphatase levels. I'm getting nervous about an enlarging pseudocyst or infection. I spoke to radiology who said there's a possibility the pseudocyst and necrosis could be obstructing the pancreatic duct, and abdominal CT with contrast is the study to look for pancreatic duct dilation and/or enlarging pseudocyst or infection. I don't think there is another obstructing gallstone suggesting of cholangitis. His liver enzyme jump is strange; it could be worsening adjacent inflammation or perhaps related to the fluconazole. -Abdominal CT  with contrast today -Advance diet as tolerated per the patient's ability -Ambulate as much as possible to prevent pulmonary embolus -Continue PCA -Continue TPN -Continue D5 NS at 60 cc/hr -Holding home medications -Will need outpatient cholecystectomy once improved -Daily CMP and BMPs  Majocchi's granuloma: Treating with fluconazole for 4 weeks total. Might be related to transaminitis but I think worsening pancreatitis is more likely. -Continue IV fluconazole 246m daily (stop date 2/19) -Can change to oral once he can swallow pills  Dispo: Disposition is deferred at this time, awaiting improvement of current medical problems.   The [place] and [facility].  The patient does have transportation limitations that hinder transportation to clinic appointments.  .Services Needed at time of discharge: Y = Yes, Blank = No PT:   OT:   RN:   Equipment:   Other:     LOS: 20 days   KLoleta Chance MD 01/25/2016, 8:51 AM

## 2016-01-25 NOTE — Progress Notes (Signed)
PARENTERAL NUTRITION CONSULT NOTE - FOLLOW UP  Pharmacy Consult for TPN Indication: Pancreatitis  Allergies  Allergen Reactions  . Penicillins Rash    Patient Measurements: Height: _0  (162.6 cm) Weight: 167 lb 15.9 oz (76.2 kg) IBW/kg (Calculated) : 59.2  Vital Signs: Temp: 98.7 F (37.1 C) (01/28 0515) Temp Source: Oral (01/28 0515) BP: 154/93 mmHg (01/28 0515) Pulse Rate: 91 (01/28 0515) Intake/Output from previous day: 01/27 0701 - 01/28 0700 In: 10 [I.V.:10] Out: 1250 [Urine:700; Emesis/NG output:550] Intake/Output from this shift:    Labs:  Recent Labs  01/25/16 0500  WBC 15.2*  HGB 13.1  HCT 38.9*  PLT 478*     Recent Labs  01/23/16 1045 01/24/16 0330 01/25/16 0500  NA 138 141 142  K 3.5 3.3* 3.7  CL 101 102 102  CO2 _1 GLUCOSE 154* 153* 139*  BUN _2 CREATININE 0.74 0.81 0.82  CALCIUM 9.3 9.2 9.3  MG 1.7 2.0  --   PHOS 3.0  --   --   PROT 7.3 7.3 7.1  ALBUMIN 3.2* 3.2* 3.2*  AST 47* 42* 157*  ALT 109* 90* 251*  ALKPHOS 122 104 113  BILITOT 0.6 0.7 0.8   Estimated Creatinine Clearance: 111.8 mL/min (by C-G formula based on Cr of 0.82).   No results for input(s): GLUCAP in the last 72 hours.   Admit: 40 yo M w/ chief complaint of stomach pain and N/V. Found to have gallstone pancreatitis and ileus. Pharmacy consulted to dose TPN for nutrition support.  Nutritional Goals: per RD 1/20 Kcal: 1900-2100 Protein: 95-110 grams Fluid: 1.9-2.1 L  Current Nutrition:  Clear liquid diet - zero PO intake recorded in EPIC TPN @ 86m/hr + IVFE @ 121mhr. This provides 100 gm protein and 1900 kcals to meet 100% of goals.  D5NS @ 6074mr. This provides 245 kcal  Insulin Requirements in the past 24 hours:  None  Surgeries/Procedures: Plan for cholecystectomy on another visit once more stable  GI:  Sever acute gallstone pancreatitis complicated by pancreatic necrosis and possible ductal disruption, for cholecystectomy once  pancreatitis is better.Prealbumin improved to 26.6. Albumin 3.2. GI and nutrition recommended enteral feeding but failed NGT placement 1/17 2nd N/V, & was pulled. Patient had (+)abd pain with po intake, then changed to NPO again. Now  clear liquids ordered but zero intake recorded. Pt still has abdominal pain although a touch improved per MD. Still no BM since 1/10. Abdominal CT w/ contrast today since MD concerned about enlarging pseudocyst or infection  1/13:  CT (+)sig inc peripancreatic & RP fluid - c/w infarction or early necrosis.  Endo:  no hx DM, Glucose 139 w/ TPN at goal, SSI and CBGs stopped.  Lytes:  K 3.7, phos 3, mag 2  Renal:  SCr wnl, CrCl > 100m18mn.  D5NS at 60 ml/hr  Pulm:  [place]-1L  Cards: VSS  Hepatobil:  PMH HLD, on fenofibrate and atorvastatin PTA.  AST/ALT up today - possible inflammation around liver vs. Fluconazole. Alk Phos wnl. TBili ok at 0.6. Triglycerides back up to 222.  Neuro: Dilaudid PCA - titrated down to prevent dependence and ileus,   ID:  S/p Primaxin for necrosis with acute pancreatitis. The new CT showed evidence of necrosis in tail of pancreas. AFeb in last 24hr. WBC back up to 15.2. Also on fluconazole x 4 weeks for majocchi's granuloma   1/13 Imipenem >> 1/20 1/19 fluconazole>>  Best Practices: enoxaparin  TPN Access: PICC placed  1/13  TPN start date: 1/13 >>  Plan:  -Continue Clinimix E5/15 at 83 ml/hr -Continue IVFE at 10 ml/hr. Monitor TG closely  -Continue D5NS at 60 ml/hr to keep total IVF ~150 ml/hr -This provides 100g of protein and 2139 kcal which meets 100% of patient needs -Continue MVI and trace elements in TPN -F/U further toleration of diet -F/u abdominal CT results   Drew Prince, PharmD., BCPS Clinical Pharmacist Phone [phone]

## 2016-01-25 NOTE — Progress Notes (Signed)
Pt observed to be nauseous with vomiting, twice since the beginning of the shift, had iv compazine  twice and Zofran   with little effect, pt still c/o of nausea, Dr Kyung Rudd, W. (on call) paged and notified, added iv phenergan to his PRN medication, pt reassured with call light at bedside, will however continue to monitor. Obasogie-Asidi, Dreden Rivere Efe

## 2016-01-26 DIAGNOSIS — R1110 Vomiting, unspecified: Secondary | ICD-10-CM | POA: Insufficient documentation

## 2016-01-26 LAB — COMPREHENSIVE METABOLIC PANEL
ALK PHOS: 99 U/L (ref 38–126)
ALT: 221 U/L — ABNORMAL HIGH (ref 17–63)
ANION GAP: 10 (ref 5–15)
AST: 102 U/L — ABNORMAL HIGH (ref 15–41)
Albumin: 3 g/dL — ABNORMAL LOW (ref 3.5–5.0)
BILIRUBIN TOTAL: 0.7 mg/dL (ref 0.3–1.2)
BUN: 12 mg/dL (ref 6–20)
CALCIUM: 9 mg/dL (ref 8.9–10.3)
CO2: 29 mmol/L (ref 22–32)
Chloride: 101 mmol/L (ref 101–111)
Creatinine, Ser: 0.72 mg/dL (ref 0.61–1.24)
Glucose, Bld: 146 mg/dL — ABNORMAL HIGH (ref 65–99)
POTASSIUM: 3.4 mmol/L — AB (ref 3.5–5.1)
Sodium: 140 mmol/L (ref 135–145)
TOTAL PROTEIN: 6.9 g/dL (ref 6.5–8.1)

## 2016-01-26 LAB — CBC
HEMATOCRIT: 38.1 % — AB (ref 39.0–52.0)
HEMOGLOBIN: 12.4 g/dL — AB (ref 13.0–17.0)
MCH: 26 pg (ref 26.0–34.0)
MCHC: 32.5 g/dL (ref 30.0–36.0)
MCV: 79.9 fL (ref 78.0–100.0)
Platelets: 381 10*3/uL (ref 150–400)
RBC: 4.77 MIL/uL (ref 4.22–5.81)
RDW: 13.9 % (ref 11.5–15.5)
WBC: 14 10*3/uL — AB (ref 4.0–10.5)

## 2016-01-26 MED ORDER — FAT EMULSION 20 % IV EMUL
240.0000 mL | INTRAVENOUS | Status: AC
  Administered 2016-01-26: 240 mL via INTRAVENOUS
  Filled 2016-01-26: qty 250

## 2016-01-26 MED ORDER — MORPHINE SULFATE (PF) 2 MG/ML IV SOLN
2.0000 mg | INTRAVENOUS | Status: DC | PRN
  Administered 2016-01-26 (×2): 4 mg via INTRAVENOUS
  Filled 2016-01-26 (×2): qty 2

## 2016-01-26 MED ORDER — ONDANSETRON HCL 4 MG/2ML IJ SOLN: 4.0000 mg | Freq: Four times a day (QID) | INTRAMUSCULAR | Status: DC | PRN

## 2016-01-26 MED ORDER — TRACE MINERALS CR-CU-MN-SE-ZN 10-1000-500-60 MCG/ML IV SOLN
INTRAVENOUS | Status: AC
  Administered 2016-01-26: 17:00:00 via INTRAVENOUS
  Filled 2016-01-26: qty 1992

## 2016-01-26 MED ORDER — PALONOSETRON HCL INJECTION 0.25 MG/5ML
0.2500 mg | Freq: Once | INTRAVENOUS | Status: DC
  Filled 2016-01-26: qty 5

## 2016-01-26 MED ORDER — POTASSIUM CHLORIDE 10 MEQ/50ML IV SOLN
10.0000 meq | INTRAVENOUS | Status: AC
  Administered 2016-01-26 (×4): 10 meq via INTRAVENOUS
  Filled 2016-01-26 (×5): qty 50

## 2016-01-26 MED ORDER — PALONOSETRON HCL INJECTION 0.25 MG/5ML
0.2500 mg | Freq: Once | INTRAVENOUS | Status: AC
  Administered 2016-01-26: 0.25 mg via INTRAVENOUS
  Filled 2016-01-26: qty 5

## 2016-01-26 NOTE — Progress Notes (Signed)
PARENTERAL NUTRITION CONSULT NOTE - FOLLOW UP  Pharmacy Consult for TPN Indication: Pancreatitis  Allergies  Allergen Reactions  . Penicillins Rash    Patient Measurements: Height: 5' 4" (162.6 cm) Weight: 167 lb 15.9 oz (76.2 kg) IBW/kg (Calculated) : 59.2  Vital Signs: Temp: 97.7 F (36.5 C) (01/29 0908) Temp Source: Axillary (01/29 0908) BP: 145/81 mmHg (01/29 0908) Pulse Rate: 73 (01/29 0908) Intake/Output from previous day:   Intake/Output from this shift:    Labs:  Recent Labs  01/25/16 0500 01/26/16 0408  WBC 15.2* 14.0*  HGB 13.1 12.4*  HCT 38.9* 38.1*  PLT 478* 381     Recent Labs  01/24/16 0330 01/25/16 0500 01/26/16 0408  NA 141 142 140  K 3.3* 3.7 3.4*  CL 102 102 101  CO2 _0 GLUCOSE 153* 139* 146*  BUN _1 CREATININE 0.81 0.82 0.72  CALCIUM 9.2 9.3 9.0  MG 2.0  --   --   PROT 7.3 7.1 6.9  ALBUMIN 3.2* 3.2* 3.0*  AST 42* 157* 102*  ALT 90* 251* 221*  ALKPHOS 104 113 99  BILITOT 0.7 0.8 0.7   Estimated Creatinine Clearance: 114.6 mL/min (by C-G formula based on Cr of 0.72).   No results for input(s): GLUCAP in the last 72 hours.   Admit: 40 yo M w/ chief complaint of stomach pain and N/V. Found to have gallstone pancreatitis and ileus. Pharmacy consulted to dose TPN for nutrition support.  Nutritional Goals: per RD 1/20 Kcal: 1900-2100 Protein: 95-110 grams Fluid: 1.9-2.1 L  Current Nutrition:  Clear liquid diet - zero PO intake recorded in EPIC TPN @ 33m/hr + IVFE @ 172mhr. This provides 100 gm protein and 1900 kcals to meet 100% of goals.  D5NS @ 6072mr. This provides 245 kcal  Insulin Requirements in the past 24 hours:  None  Surgeries/Procedures: Plan for cholecystectomy on another visit once more stable  GI:  Sever acute gallstone pancreatitis complicated by pancreatic necrosis and possible ductal disruption, for cholecystectomy once pancreatitis is better.Prealbumin improved to 26.6. Albumin  3.2. GI and nutrition recommended enteral feeding but failed NGT placement 1/17 2nd N/V, & was pulled. Patient had (+)abd pain with po intake, then changed to NPO again. Now  clear liquids ordered but zero intake recorded. Pt still has abdominal pain although a touch improved per MD and persistent nausea. Getting palonosetron once today since not responding to zofran, compazine or phenergan. Still no BM since 1/10. CT scan of abdomen confirms evolving pseudocyst  1/13:  CT (+)sig inc peripancreatic & RP fluid - c/w infarction or early necrosis.  Endo:  no hx DM, Glucose 146 w/ TPN at goal, SSI and CBGs stopped.  Lytes:  K 3.4, phos 3, mag 2  Renal:  SCr wnl, CrCl > 100m24mn.  D5NS at 60 ml/hr  Pulm:  [place]-1L  Cards: VSS  Hepatobil:  PMH HLD, on fenofibrate and atorvastatin PTA.  AST/ALT elevated but trending down - possible inflammation around liver vs. Fluconazole. Fluconazole stopped. Alk Phos wnl. TBili ok at 0.7. Triglycerides back up to 222.  Neuro: Dilaudid PCA stopped and switched fo prn morphine for pain as this may be causing N/V.    ID:  S/p Primaxin for necrosis with acute pancreatitis. The new CT showed evidence of necrosis in tail of pancreas. AFeb in last 24hr. WBC back up at 14. Also on fluconazole x 4 weeks for majocchi's granuloma   1/13 Imipenem >> 1/20 1/19 fluconazole>>1/29  Best Practices: enoxaparin  TPN Access: PICC placed 1/13  TPN start date: 1/13 >>  Plan:  -Continue Clinimix E5/15 at 83 ml/hr -Continue IVFE at 10 ml/hr. Monitor TG closely  -Continue D5NS at 60 ml/hr to keep total IVF ~150 ml/hr -This provides 100g of protein and 2139 kcal which meets 100% of patient needs -Continue MVI and trace elements in TPN -F/U if N/V improves with above listed changes   Benjamin Mancheril, PharmD., BCPS Clinical Pharmacist Phone [phone]      

## 2016-01-26 NOTE — Progress Notes (Signed)
Patient ID: Drew Prince, male   DOB: 06/15/1976, 40 y.o.   MRN: [id number]   Subjective: Drew Prince continues to have intractable nausea. He hasn't been getting much Dilaudid recently because his pain has improved significantly. He's been having regular bowel movements since two days ago.  Objective: Vital signs in last 24 hours: Filed Vitals:   01/26/16 0400 01/26/16 0507 01/26/16 0800 01/26/16 0908  BP:  154/96  145/81  Pulse:  84  73  Temp:  98.6 F (37 C)  97.7 F (36.5 C)  TempSrc:  Oral  Axillary  Resp: Height:      Weight:      SpO2: 95% 100% 100% 100%    Physical exam: General: [name] man appearing lethargic but conversational Cardiac: regular rate and rhythm, no rubs, murmurs or gallops Pulm: breathing well, clear bilaterally Abd: bowel sounds normal, non-distended, still tender in the left lower quadrant, but less so than previous days Ext: warm and well perfused, without pedal edema or calf tenderness  Lab Results: Basic Metabolic Panel:  Recent Labs Lab 01/20/16 0540  01/23/16 1045 01/24/16 0330 01/25/16 0500 01/26/16 0408  NA 138  < > 138 141 142 140  K 4.2  < > 3.5 3.3* 3.7 3.4*  CL 99*  < > 101 102 102 101  CO2 31  < > GLUCOSE 109*  < > 154* 153* 139* 146*  BUN 12  < > CREATININE 0.70  < > 0.74 0.81 0.82 0.72  CALCIUM 9.4  < > 9.3 9.2 9.3 9.0  MG 2.0  --  1.7 2.0  --   --   PHOS 5.4*  --  3.0  --   --   --   < > = values in this interval not displayed.   Liver Function Tests:  Recent Labs Lab 01/25/16 0500 01/26/16 0408  AST 157* 102*  ALT 251* 221*  ALKPHOS 113 99  BILITOT 0.8 0.7  PROT 7.1 6.9  ALBUMIN 3.2* 3.0*   CBC:  Recent Labs Lab 01/25/16 0500 01/26/16 0408  WBC 15.2* 14.0*  HGB 13.1 12.4*  HCT 38.9* 38.1*  MCV 79.9 79.9  PLT 478* 381   Studies/Results: Ct Abdomen Pelvis W Contrast  01/25/2016  CLINICAL DATA:  Left lower quadrant abdominal pain in a patient with  pancreatitis, pancreatic necrosis, and pseudocyst formation. EXAM: CT ABDOMEN AND PELVIS WITH CONTRAST TECHNIQUE: Multidetector CT imaging of the abdomen and pelvis was performed using the standard protocol following bolus administration of intravenous contrast. CONTRAST:  OMNIPAQUE IOHEXOL 300 MG/ML  SOLN COMPARISON:  01/05/2016.  MRI from 01/16/2016. FINDINGS: Lower chest: Subsegmental atelectasis noted in the lung bases, left greater than right. Hepatobiliary: No focal abnormality within the liver parenchyma. There is no evidence for gallstones, gallbladder wall thickening, or pericholecystic fluid. No intrahepatic or extrahepatic biliary dilation. Pancreas: Multiple fluid collections are identified in and adjacent to the pancreas. 14 mm new fluid collection is identified in the head of the pancreas (image 34 series 2). An amorphous fluid collection is identified anterior to and superior to the pancreatic body and tail. This is approximately 12.0 x 4.9 x 7.2 cm in size. Inferiorly, there is a thin developing wall evident along the fluid, but superiorly as the fluid tracks posterior to the stomach and medial to the spleen, there is no organized wall associated no gas within this collection. There is marked thinning of the  pancreatic parenchyma region of the tail in some areas of pancreatic necrosis are suspected. Spleen: No splenomegaly. No focal mass lesion. Adrenals/Urinary Tract: No adrenal nodule or mass. Early excretion of contrast noted in the kidneys bilaterally. Renal appearance is otherwise unremarkable. No evidence for hydroureter. The urinary bladder appears normal for the degree of distention. Stomach/Bowel: There is minimal wall thickening posterior gastric fundus, likely secondary to the pancreatic process. Stomach otherwise unremarkable. Duodenum is normally positioned as is the ligament of Treitz. No small bowel wall thickening. No small bowel dilatation. Insert normal terminal only on The  appendix is normal. Colon is decompressed. Vascular/Lymphatic: No abdominal aortic aneurysm. Portal vein is patent. Superior mesenteric vein and splenic vein are patent although there is some attenuation of the mid splenic vein. Celiac axis and superior mesenteric artery are patent. No evidence for pseudoaneurysm and No abdominal atherosclerotic calcification. There is no gastrohepatic or hepatoduodenal ligament lymphadenopathy. No intraperitoneal or retroperitoneal lymphadenopathy. No pelvic sidewall lymphadenopathy. Reproductive: The prostate gland and seminal vesicles have normal imaging features. Other: Insert no peritoneal free fluid. Musculoskeletal: Bone windows reveal no worrisome lytic or sclerotic osseous lesions. IMPRESSION: Similar appearance in the retroperitoneal fluid associated with pancreatic body and tail comparing to the MRI. Collection shows some peripheral organization inferiorly, but the wall along the cranial portion of the collection is not as well-defined. Imaging features are compatible with an evolving pseudocyst. There is no gas within the collection suggest superinfection although superinfection cannot be excluded by CT imaging. There is a small fluid collection in the head of the pancreas. Areas of apparent pancreatic necrosis involving the tail. Gallstones visualized on the previous MRI are not apparent by CT. Electronically Signed   By: Kennith Center M.D.   On: 01/25/2016 11:21   Medications: I have reviewed the patient's current medications. Scheduled Meds: . enoxaparin (LOVENOX) injection  40 mg Subcutaneous Q24H  . fluconazole (DIFLUCAN) IV  200 mg Intravenous Q24H  . palonosetron  0.25 mg Intravenous Once  . polyethylene glycol  17 g Oral Daily  . senna-docusate  1 tablet Oral BID   Continuous Infusions: . dextrose 5 % and 0.9% NaCl 60 mL/hr at 01/25/16 1924  . Marland KitchenTPN (CLINIMIX-E) Adult 83 mL/hr at 01/25/16 1747   And  . fat emulsion 240 mL (01/25/16 1748)   PRN  Meds:.morphine injection, promethazine, sodium chloride   Assessment/Plan:  Acute pancreatitis with necrosis and pseudocyst: His abdominal pain is improving his he's still quite nauseous;no fevers overnight and his leukocytosis and transaminitis have improved slightly compared to yesterday. His abdominal CT yesterday showed an enlarging, evolving pseudocyst without signs of gas suggestive of superinfection. I spoke to Interventional Radiology who reviewed the films and said he would defer CT-guided drainage unless he becomes dangerously ill, as percutaneous drainage often becomes complicated by fistulization and we may do more harm than good. -Advance diet as tolerated per the patient's ability -Ambulate as much as possible to prevent venous thrombosis -Continue TPN -Continue D5 NS at 60 cc/hr -Holding home medications -Will need outpatient cholecystectomy once improved -Daily CMP and BMPs  Intractable nausea: It's hard to say whether this is from the opiates, general inflammation, or perhaps the fluconazole as nausea is reported in 2-7% of patients. We'll stop the dilaudid PCA today and start him on morphine as needed. I've also stopped his fluconazole. Dr. Cyndie Chime is an oncologist and has had success with Palonesetron so we'll give that a shot to hopefully give him some relief. This medication lasts for  about 5 days. He can continue to get promethazine as well as needed. -Stopped fluconazole -Gave palonosetron 0.25mg  IV once for nausea -Continue promethazine 12.5-25mg  as needed every 6 hours for nausea  Majocchi's granuloma: Stopped fluconazole. Once he can tolerate pills, we can try itraconazole. -Can start itraconazole once he can swallow  Dispo: Disposition is deferred at this time, awaiting improvement of current medical problems.   The patient does have a current PCP [facility]) and does need an [facility] hospital follow-up appointment after  discharge.  The patient does not know have transportation limitations that hinder transportation to clinic appointments.  .Services Needed at time of discharge: Y = Yes, Blank = No PT:   OT:   RN:   Equipment:   Other:     LOS: 21 days   Selina Cooley, MD 01/26/2016, 9:23 AM

## 2016-01-27 LAB — COMPREHENSIVE METABOLIC PANEL
ALBUMIN: 3.2 g/dL — AB (ref 3.5–5.0)
ALT: 186 U/L — ABNORMAL HIGH (ref 17–63)
AST: 71 U/L — AB (ref 15–41)
Alkaline Phosphatase: 92 U/L (ref 38–126)
Anion gap: 10 (ref 5–15)
BILIRUBIN TOTAL: 0.4 mg/dL (ref 0.3–1.2)
BUN: 10 mg/dL (ref 6–20)
CHLORIDE: 100 mmol/L — AB (ref 101–111)
CO2: 27 mmol/L (ref 22–32)
Calcium: 8.8 mg/dL — ABNORMAL LOW (ref 8.9–10.3)
Creatinine, Ser: 0.65 mg/dL (ref 0.61–1.24)
GFR calc Af Amer: >60 mL/min (ref >60)
GFR calc non Af Amer: >60 mL/min (ref >60)
GLUCOSE: 134 mg/dL — AB (ref 65–99)
POTASSIUM: 3.2 mmol/L — AB (ref 3.5–5.1)
SODIUM: 137 mmol/L (ref 135–145)
Total Protein: 6.6 g/dL (ref 6.5–8.1)

## 2016-01-27 LAB — CBC
HEMATOCRIT: 39.2 % (ref 39.0–52.0)
Hemoglobin: 13.3 g/dL (ref 13.0–17.0)
MCH: 26.7 pg (ref 26.0–34.0)
MCHC: 33.9 g/dL (ref 30.0–36.0)
MCV: 78.6 fL (ref 78.0–100.0)
Platelets: 364 10*3/uL (ref 150–400)
RBC: 4.99 MIL/uL (ref 4.22–5.81)
RDW: 13.6 % (ref 11.5–15.5)
WBC: 13.8 10*3/uL — ABNORMAL HIGH (ref 4.0–10.5)

## 2016-01-27 LAB — MAGNESIUM: Magnesium: 1.9 mg/dL (ref 1.7–2.4)

## 2016-01-27 LAB — TRIGLYCERIDES: Triglycerides: 170 mg/dL — ABNORMAL HIGH (ref <150)

## 2016-01-27 LAB — PHOSPHORUS: Phosphorus: 4.4 mg/dL (ref 2.5–4.6)

## 2016-01-27 LAB — PREALBUMIN: Prealbumin: 36.5 mg/dL (ref 18–38)

## 2016-01-27 MED ORDER — DIPHENHYDRAMINE HCL 25 MG PO CAPS
25.0000 mg | ORAL_CAPSULE | Freq: Every evening | ORAL | Status: DC | PRN
  Administered 2016-01-27: 25 mg via ORAL
  Filled 2016-01-27: qty 1

## 2016-01-27 MED ORDER — POTASSIUM CHLORIDE 10 MEQ/50ML IV SOLN
10.0000 meq | INTRAVENOUS | Status: AC
  Administered 2016-01-27 (×6): 10 meq via INTRAVENOUS
  Filled 2016-01-27 (×6): qty 50

## 2016-01-27 MED ORDER — PROMETHAZINE HCL 25 MG/ML IJ SOLN
12.5000 mg | INTRAMUSCULAR | Status: DC | PRN
  Administered 2016-01-27 – 2016-01-31 (×15): 25 mg via INTRAVENOUS
  Administered 2016-02-01: 12.5 mg via INTRAVENOUS
  Administered 2016-02-02: 25 mg via INTRAVENOUS
  Filled 2016-01-27 (×17): qty 1

## 2016-01-27 MED ORDER — FAT EMULSION 20 % IV EMUL
240.0000 mL | INTRAVENOUS | Status: AC
  Administered 2016-01-27: 240 mL via INTRAVENOUS
  Filled 2016-01-27: qty 250

## 2016-01-27 MED ORDER — TRACE MINERALS CR-CU-MN-SE-ZN 10-1000-500-60 MCG/ML IV SOLN
INTRAVENOUS | Status: AC
  Administered 2016-01-27: 18:00:00 via INTRAVENOUS
  Filled 2016-01-27: qty 1992

## 2016-01-27 MED ORDER — MORPHINE SULFATE (PF) 2 MG/ML IV SOLN
1.0000 mg | INTRAVENOUS | Status: DC | PRN
  Administered 2016-01-28: 0.5 mg via INTRAVENOUS
  Administered 2016-01-28: 1 mg via INTRAVENOUS
  Administered 2016-01-28: 0.5 mg via INTRAVENOUS
  Administered 2016-01-29: 2 mg via INTRAVENOUS
  Administered 2016-01-29 – 2016-01-30 (×4): 1 mg via INTRAVENOUS
  Filled 2016-01-27 (×8): qty 1

## 2016-01-27 NOTE — Progress Notes (Signed)
Patient ID: Drew Prince, male   DOB: Apr 13, 1976, 40 y.o.   MRN: [id number]   Subjective: Drew Prince continues to be nauseous after getting the palonosetron yesterday evening. I told him and the nurse he can continue to get phenergan as needed. He tells me the morphine makes him feel too "high" and he'd like to go on down on the dose which I've done. His abdominal pain is markedly improved; his nausea continue to be his his main complaint so we're going to work on controlling that. He denies any pain in his legs or shortness of breath. No other issues.  Objective: Vital signs in last 24 hours: Filed Vitals:   01/26/16 1721 01/26/16 2156 01/27/16 0142 01/27/16 0528  BP: 142/93 156/87 155/92 142/92  Pulse: 84 74 84 74  Temp: 99.5 F (37.5 C) 99.5 F (37.5 C) 98.6 F (37 C) 98.7 F (37.1 C)  TempSrc: Oral Oral Oral Oral  Resp: Height:      Weight:      SpO2: 100% 99% 100% 100%   Physical exam: General: [name] man appearing lethargic but conversational Cardiac: regular rate and rhythm, no rubs, murmurs or gallops Pulm: breathing well, clear bilaterally Abd: bowel sounds normal, non-distended, still tender in the left lower quadrant, still slowly improving Ext: warm and well perfused, without pedal edema or calf tenderness  Lab Results: Basic Metabolic Panel:  Recent Labs Lab 01/23/16 1045 01/24/16 0330  01/26/16 0408 01/27/16 0408  NA 138 141  < > 140 137  K 3.5 3.3*  < > 3.4* 3.2*  CL 101 102  < > 101 100*  CO2 27 30  < > 29 27  GLUCOSE 154* 153*  < > 146* 134*  BUN 11 12  < > 12 10  CREATININE 0.74 0.81  < > 0.72 0.65  CALCIUM 9.3 9.2  < > 9.0 8.8*  MG 1.7 2.0  --   --  1.9  PHOS 3.0  --   --   --  4.4  < > = values in this interval not displayed.   Liver Function Tests:  Recent Labs Lab 01/26/16 0408 01/27/16 0408  AST 102* 71*  ALT 221* 186*  ALKPHOS 99 92  BILITOT 0.7 0.4  PROT 6.9 6.6  ALBUMIN 3.0* 3.2*   CBC:  Recent Labs Lab  01/26/16 0408 01/27/16 0408  WBC 14.0* 13.8*  HGB 12.4* 13.3  HCT 38.1* 39.2  MCV 79.9 78.6  PLT 381 364   Medications: I have reviewed the patient's current medications. Scheduled Meds: . enoxaparin (LOVENOX) injection  40 mg Subcutaneous Q24H  . polyethylene glycol  17 g Oral Daily  . senna-docusate  1 tablet Oral BID   Continuous Infusions: . dextrose 5 % and 0.9% NaCl 60 mL/hr at 01/25/16 1924  . Marland KitchenTPN (CLINIMIX-E) Adult 83 mL/hr at 01/26/16 1715   And  . fat emulsion 240 mL (01/26/16 1716)   PRN Meds:.morphine injection, promethazine, sodium chloride   Assessment/Plan:  Acute pancreatitis with necrosis and pseudocyst: His abdominal pain is improving his he's still quite nauseous despite getting the palonestron yesterday. No fevers overnight and his leukocytosis and transaminitis have improved slightly compared to two days ago. -Advance diet as tolerated per the patient's ability -Ambulate as much as possible to prevent venous thrombosis  -Continue TPN -Continue D5 NS at 60 cc/hr -Holding home medications -Will need outpatient cholecystectomy once improved -Daily CMP and BMPs  Intractable nausea: It's hard to say  whether this is from the Dilaudid, general inflammation, or perhaps the fluconazole. We changed the Dilaudid to morphine and stopped the fluconazole yesterday. We tried palonosetron yesterday which should start working today and last for 5 days. There's no oral formulation and I don't think he needs another IV dose, so he can continue to get promethazine as well as needed. I've dropped the morphine dose as it made him too high yesterday. -Continue promethazine 12.5-25mg  as needed every 6 hours for nausea -Dropped morphine dose to 1-2mg  as needed for pain  Majocchi's granuloma: Stopped fluconazole. Once he can tolerate pills, we can try itraconazole. -Can start itraconazole once he can swallow  Dispo: Disposition is deferred at this time, awaiting improvement of  current medical problems.   The patient does have a current PCP [facility]) and does need an [facility] hospital follow-up appointment after discharge.  The patient does have transportation limitations that hinder transportation to clinic appointments.  .Services Needed at time of discharge: Y = Yes, Blank = No PT:   OT:   RN:   Equipment:   Other:     LOS: 22 days   Selina Cooley, MD 01/27/2016, 8:17 AM

## 2016-01-27 NOTE — Progress Notes (Signed)
Nutrition Follow-up  DOCUMENTATION CODES:   Obesity unspecified  INTERVENTION:  Continue TPN, per pharmacy Monitor diet advancement and PO adequacy Recommend weekly weights  NUTRITION DIAGNOSIS:   Inadequate oral intake related to altered GI function as evidenced by NPO status. Ongoing  GOAL:   Patient will meet greater than or equal to 90% of their needs  Being Met  MONITOR:   Diet advancement, Labs, Weight trends, Skin, I & O's  REASON FOR ASSESSMENT:   Consult Enteral/tube feeding initiation and management  ASSESSMENT:   40 year old Asian man presented to the emergency department and was admitted with a diagnosis of acute pancreatitis. He has had diffuse abdominal pain with associated nausea and vomiting for about 1 day. Otherwise has been in his usual state of health. Negative alcohol history. No new prescription or nonprescription medications.   Pt continues to receive TPN at goal rate- meeting 100% of estimated energy and protein needs. He remains on clear liquids only. Per MD note, abdominal exam- still tender but improving; persistent nausea.  Pt was re-weighed on 1/23 and weight was down 19 lbs from admission weight; will monitor new weight this week- if weight continues to trend down, will re-estimate energy and protein needs.   Labs: low potassium, elevated triglycerides  Diet Order:  Diet clear liquid Room service appropriate?: Yes; Fluid consistency:: Thin TPN (CLINIMIX-E) Adult TPN (CLINIMIX-E) Adult  Skin:  Reviewed, no issues  Last BM:  1/29  Height:   Ht Readings from Last 1 Encounters:  01/10/16 _0  (1.626 m)    Weight:   Wt Readings from Last 1 Encounters:  01/20/16 167 lb 15.9 oz (76.2 kg)    Ideal Body Weight:  59.1 kg  BMI:  Body mass index is 28.82 kg/(m^2).  Estimated Nutritional Needs:   Kcal:  1900-2100  Protein:  95-110 grams  Fluid:  1.9-2.1 L  EDUCATION NEEDS:   No education needs identified at this  time  [place], LDN Inpatient Clinical Dietitian Pager: [phone] After Hours Pager: [phone]

## 2016-01-27 NOTE — Progress Notes (Signed)
PARENTERAL NUTRITION CONSULT NOTE - FOLLOW UP  Pharmacy Consult for TPN Indication: Pancreatitis  Allergies  Allergen Reactions  . Penicillins Rash    Patient Measurements: Height: '5\' 4"'$  (162.6 cm) Weight: 167 lb 15.9 oz (76.2 kg) IBW/kg (Calculated) : 59.2  Vital Signs: Temp: 98.6 F (37 C) (01/30 0932) Temp Source: Oral (01/30 0932) BP: 142/92 mmHg (01/30 0932) Pulse Rate: 84 (01/30 0932) Intake/Output from previous day: 01/29 0701 - 01/30 0700 In: -  Out: 120 [Emesis/NG output:120] Intake/Output from this shift: Total I/O In: 120 [P.O.:120] Out: -   Labs:  Recent Labs  01/25/16 0500 01/26/16 0408 01/27/16 0408  WBC 15.2* 14.0* 13.8*  HGB 13.1 12.4* 13.3  HCT 38.9* 38.1* 39.2  PLT 478* 381 364     Recent Labs  01/25/16 0500 01/26/16 0408 01/27/16 0408  NA 142 140 137  K 3.7 3.4* 3.2*  CL 102 101 100*  CO2 '30 29 27  '$ GLUCOSE 139* 146* 134*  BUN '12 12 10  '$ CREATININE 0.82 0.72 0.65  CALCIUM 9.3 9.0 8.8*  MG  --   --  1.9  PHOS  --   --  4.4  PROT 7.1 6.9 6.6  ALBUMIN 3.2* 3.0* 3.2*  AST 157* 102* 71*  ALT 251* 221* 186*  ALKPHOS 113 99 92  BILITOT 0.8 0.7 0.4  PREALBUMIN  --   --  36.5  TRIG  --   --  170*   Estimated Creatinine Clearance: 114.6 mL/min (by C-G formula based on Cr of 0.65).   No results for input(s): GLUCAP in the last 72 hours.   Admit: 40 yo M w/ chief complaint of stomach pain and N/V. Found to have gallstone pancreatitis and ileus. Pharmacy consulted to dose TPN for nutrition support.  Nutritional Goals: per RD 1/20 Kcal: 1900-2100 Protein: 95-110 grams Fluid: 1.9-2.1 L  Current Nutrition:  Clear liquid diet - 30% PO intake recorded in EPIC TPN @ 35m/hr + IVFE @ 154mhr. This provides 100 gm protein and 1900 kcals to meet 100% of goals.  D5NS @ 6061mr. This provides 245 kcal  Insulin Requirements in the past 24 hours:  None  Surgeries/Procedures: Plan for cholecystectomy on another visit once more  stable  GI:  Severe acute gallstone pancreatitis complicated by pancreatic necrosis and possible ductal disruption, for cholecystectomy once pancreatitis is better.Prealbumin improved to 36.5. Albumin 3.2. GI and nutrition recommended enteral feeding but failed NGT placement 1/17 2nd N/V, & was pulled. Patient had (+)abd pain with po intake, then changed to NPO again. Now clear liquids ordered and 30% intake recorded. Pt abdominal pain markedly improved per MD and persistent nausea. Received palonosetron once yesterday since not responding to zofran, compazine or phenergan but continues to be nauseous. Last BM was 1/29.   1/13: CT (+)sig inc peripancreatic & RP fluid - c/w infarction or early necrosis. 1/28: CT scan of abdomen confirms evolving pseudocyst  Endo:  no hx DM, Glucose 134 w/ TPN at goal, SSI and CBGs stopped.  Lytes:  K 3.2, Phos 4.4, Mg 1.9, corr Ca 9.1  Renal:  SCr wnl, CrCl > 100m31mn.  D5NS at 60 ml/hr  Pulm:  [place]-2L  Cards: VSS  Hepatobil:  PMH HLD, on fenofibrate and atorvastatin PTA.  AST/ALT elevated but trending down - possible inflammation around liver vs. Fluconazole. Fluconazole stopped. Alk Phos wnl. TBili ok at 0.4. Triglycerides down to 170.  Neuro: Dilaudid PCA stopped and switched fo prn morphine for pain as this may be causing  N/V.    ID:  S/p Primaxin for necrosis with acute pancreatitis. The new CT showed evidence of necrosis in tail of pancreas. AFeb in last 24hr. WBC back up at 14. Was on fluconazole for majocchi's granuloma but d/c'd to rule this out as cause for nausea, plan to start itraconazole once he can swallow  1/13 Imipenem >> 1/20 1/19 fluconazole>>1/29  Best Practices: enoxaparin 40 mg/d  TPN Access: PICC placed 1/13 TPN start date: 1/13 >>  Plan:  -Continue Clinimix E5/15 at 83 ml/hr -Continue IVFE at 10 ml/hr. Monitor TG closely  -Continue D5NS at 60 ml/hr to keep total IVF ~150 ml/hr -TPN + IVFE and fluids provide 100g of protein  and 2139 kcal which meets 100% of patient needs -Give 10 meq KCl IV x6 -Continue MVI and trace elements in TPN -F/U if N/V improves with above listed changes   [facility], Pharm.D., BCPS Clinical Pharmacist Pager: [phone] 01/27/2016 10:40 AM

## 2016-01-28 LAB — COMPREHENSIVE METABOLIC PANEL
ALT: 193 U/L — ABNORMAL HIGH (ref 17–63)
ANION GAP: 11 (ref 5–15)
AST: 66 U/L — ABNORMAL HIGH (ref 15–41)
Albumin: 3.3 g/dL — ABNORMAL LOW (ref 3.5–5.0)
Alkaline Phosphatase: 86 U/L (ref 38–126)
BILIRUBIN TOTAL: 0.8 mg/dL (ref 0.3–1.2)
BUN: 10 mg/dL (ref 6–20)
CO2: 27 mmol/L (ref 22–32)
Calcium: 9 mg/dL (ref 8.9–10.3)
Chloride: 97 mmol/L — ABNORMAL LOW (ref 101–111)
Creatinine, Ser: 0.6 mg/dL — ABNORMAL LOW (ref 0.61–1.24)
GFR calc Af Amer: >60 mL/min (ref >60)
Glucose, Bld: 114 mg/dL — ABNORMAL HIGH (ref 65–99)
POTASSIUM: 3.2 mmol/L — AB (ref 3.5–5.1)
Sodium: 135 mmol/L (ref 135–145)
TOTAL PROTEIN: 6.8 g/dL (ref 6.5–8.1)

## 2016-01-28 LAB — CBC
HEMATOCRIT: 38.9 % — AB (ref 39.0–52.0)
Hemoglobin: 13 g/dL (ref 13.0–17.0)
MCH: 26.1 pg (ref 26.0–34.0)
MCHC: 33.4 g/dL (ref 30.0–36.0)
MCV: 78 fL (ref 78.0–100.0)
PLATELETS: 382 10*3/uL (ref 150–400)
RBC: 4.99 MIL/uL (ref 4.22–5.81)
RDW: 13.5 % (ref 11.5–15.5)
WBC: 12.8 10*3/uL — ABNORMAL HIGH (ref 4.0–10.5)

## 2016-01-28 MED ORDER — FAT EMULSION 20 % IV EMUL
240.0000 mL | INTRAVENOUS | Status: AC
  Administered 2016-01-28: 240 mL via INTRAVENOUS
  Filled 2016-01-28: qty 250

## 2016-01-28 MED ORDER — POTASSIUM CHLORIDE 10 MEQ/50ML IV SOLN
10.0000 meq | INTRAVENOUS | Status: AC
  Administered 2016-01-28 (×6): 10 meq via INTRAVENOUS
  Filled 2016-01-28 (×6): qty 50

## 2016-01-28 MED ORDER — TRACE MINERALS CR-CU-MN-SE-ZN 10-1000-500-60 MCG/ML IV SOLN
INTRAVENOUS | Status: AC
  Administered 2016-01-28: 19:00:00 via INTRAVENOUS
  Filled 2016-01-28: qty 1992

## 2016-01-28 NOTE — Care Management Note (Signed)
Case Management Note  Patient Details  Name: Paola Flynt MRN: [id number] Date of Birth: [birth year]  Subjective/Objective:                    Action/Plan: Patient continues with TPN. Was able to wean off the PCA. CM continuing to follow for discharge needs.   Expected Discharge Date:                  Expected Discharge Plan:     In-House Referral:     Discharge planning Services     Post Acute Care Choice:    Choice offered to:     DME Arranged:    DME Agency:     HH Arranged:    HH Agency:     Status of Service:  In process, will continue to follow  Medicare Important Message Given:    Date Medicare IM Given:    Medicare IM give by:    Date Additional Medicare IM Given:    Additional Medicare Important Message give by:     If discussed at Long Length of Stay Meetings, dates discussed:    Additional Comments:  Kermit Balo, RN 01/28/2016, 3:08 PM

## 2016-01-28 NOTE — Evaluation (Signed)
Physical Therapy Evaluation Patient Details Name: Drew Prince MRN: [id number] DOB: September 17, 1976 Today's Date: 01/28/2016   History of Present Illness  Pt is a 40 y/o male who presents with acute moderate pancreatitis.  Continued to progress with necrotic pancreatitis, use of TPN, and developed pseudocysts.   Clinical Impression  Pt admitted with above diagnosis. Patient has a very prolonged hospital stay. Patient was previously seen by PT and was discharged with exercise program and educated to walk daily in halls with nursing assist and to increase his time up in chair until he was spending more time OOB than in the bed (during the day).  Patient only recalled the exercise program and admits he has done "a little bit of some of them."   Patient's affect is extremely flat, he has poor eye contact, and takes little to no responsibility for his plan to improve ("I trust the experts. I don't question them. They know what is best for me.") He states he has a history of depression and feels he is depressed.   Patient's mobility currently impaired by his continued use of IVs/IV pole and due to the deficits listed below (see PT Problem List). Discussed with nursing the need to offer to walk with him daily and to encourage more OOB time.   As patient had very poor follow-through with last PT plan (once discharged from PT), will monitor patient ~1 visit per week to ensure he is compliant and  improving his activity. Will advance his exercises as needed.      Follow Up Recommendations No PT follow up    Equipment Recommendations  None recommended by PT    Recommendations for Other Services       Precautions / Restrictions Precautions Precautions: Fall Restrictions Weight Bearing Restrictions: No      Mobility  Bed Mobility Overal bed mobility: Modified Independent             General bed mobility comments: managing linens and lines  Transfers Overall transfer level:  Independent Equipment used: None Transfers: Sit to/from Stand Sit to Stand: Independent         General transfer comment: pt able to stand from bed and toilet independently  Ambulation/Gait Ambulation/Gait assistance: Supervision Ambulation Distance (Feet): 500 Feet Assistive device: None (vs IV pole) Gait Pattern/deviations: Step-through pattern;Wide base of support Gait velocity: Decreased   General Gait Details: Able to get behind IV pole and bend down to go to the bathroom by himself. Once in hallway, pt pushed IV pole in front of himself with 2 hands causing very short steps; attempted using 1 hand on IV pole with it beside him and pt with difficulty steering/maneuvering; with no device (PT pushes IV) pt ambulates with only 2 occasions of drift to his right when turning his head.   Stairs            Wheelchair Mobility    Modified Rankin (Stroke Patients Only)       Balance Overall balance assessment: No apparent balance deficits (not formally assessed)                                           Pertinent Vitals/Pain Pain Assessment: Faces Faces Pain Scale: Hurts little more Pain Location: abd Pain Descriptors / Indicators: Constant;Discomfort Pain Intervention(s): Limited activity within patient's tolerance;Monitored during session;Repositioned    Home Living Family/patient expects to be discharged  to:: Private residence Living Arrangements: Spouse/significant other Available Help at Discharge: Family;Available PRN/intermittently Type of Home: House Home Access: Stairs to enter   Entrance Stairs-Number of Steps: 4 Home Layout: One level Home Equipment: None Additional Comments: Wife reports she may have access to crutches and a walker from parents.     Prior Function Level of Independence: Independent               Hand Dominance        Extremity/Trunk Assessment   Upper Extremity Assessment: Overall WFL for tasks  assessed           Lower Extremity Assessment: Overall WFL for tasks assessed      Cervical / Trunk Assessment: Normal  Communication   Communication: No difficulties  Cognition Arousal/Alertness: Awake/alert Behavior During Therapy: Flat affect Overall Cognitive Status: Within Functional Limits for tasks assessed       Memory:  (decr recall prior visits/instructions (due to meds likely))     Awareness: Anticipatory Problem Solving: Slow processing;Decreased initiation General Comments: More alert; very flat and feel decr initiation related to depressed mood    General Comments General comments (skin integrity, edema, etc.): Patient has no recall of prior discussion (needing to ask for help walking if nursing does not offer; goal to spend more time OOB than in the bed). Patient unable to answer questions re: ?advancing his diet (states he is tired of juice and jello). Very depressed mood and pt acknowledges he has had issues with depression previously.     Exercises General Exercises - Lower Extremity Ankle Circles/Pumps: 10 reps Quad Sets: 5 reps Hip ABduction/ADduction:  (pt states he does these "sometimes" in bed) Straight Leg Raises:  (states this hurts his abd; instructed ok to hold this ex) Other Exercises Other Exercises: bridging-states this hurts his abd; instructed ok to hold this ex Other Exercises: sit to stand-states he has not been doing; encouraged to incr to 10x      Assessment/Plan    PT Assessment Patient needs continued PT services  PT Diagnosis Difficulty walking;Acute pain   PT Problem List Decreased activity tolerance;Decreased mobility;Pain  PT Treatment Interventions Stair training;Gait training;Functional mobility training;Therapeutic activities;Therapeutic exercise;Patient/family education   PT Goals (Current goals can be found in the Care Plan section) Acute Rehab PT Goals Patient Stated Goal: Decrease pain PT Goal Formulation: With  patient Time For Goal Achievement: 02/11/16 Potential to Achieve Goals: Good    Frequency Min 2X/week   Barriers to discharge        Co-evaluation               End of Session   Activity Tolerance: Patient tolerated treatment well Patient left: in chair;with call bell/phone within reach Nurse Communication: Mobility status (needs to walk bid; needs encouragemetn)         Time: 1313-1340 PT Time Calculation (min) (ACUTE ONLY): 27 min   Charges:   PT Evaluation $PT Eval Low Complexity: 1 Procedure PT Treatments $Gait Training: 8-22 mins   PT G Codes:        Phillipa Morden 02/25/2016, 2:15 PM  Pager [phone]

## 2016-01-28 NOTE — Progress Notes (Signed)
PARENTERAL NUTRITION CONSULT NOTE - FOLLOW UP  Pharmacy Consult for TPN Indication: Pancreatitis  Allergies  Allergen Reactions  . Penicillins Rash    Patient Measurements: Height: _0  (162.6 cm) Weight: 167 lb 15.9 oz (76.2 kg) IBW/kg (Calculated) : 59.2  Vital Signs: Temp: 98.1 F (36.7 C) (01/31 0446) Temp Source: Oral (01/31 0446) BP: 139/85 mmHg (01/31 0446) Pulse Rate: 88 (01/31 0446) Intake/Output from previous day: 01/30 0701 - 01/31 0700 In: 370 [P.O.:360; I.V.:10] Out: 850 [Urine:400; Emesis/NG output:450] Intake/Output from this shift:    Labs:  Recent Labs  01/26/16 0408 01/27/16 0408 01/28/16 0420  WBC 14.0* 13.8* 12.8*  HGB 12.4* 13.3 13.0  HCT 38.1* 39.2 38.9*  PLT 381 364 382     Recent Labs  01/26/16 0408 01/27/16 0408 01/28/16 0420  NA 140 137 135  K 3.4* 3.2* 3.2*  CL 101 100* 97*  CO2 _1 GLUCOSE 146* 134* 114*  BUN _2 CREATININE 0.72 0.65 0.60*  CALCIUM 9.0 8.8* 9.0  MG  --  1.9  --   PHOS  --  4.4  --   PROT 6.9 6.6 6.8  ALBUMIN 3.0* 3.2* 3.3*  AST 102* 71* 66*  ALT 221* 186* 193*  ALKPHOS 99 92 86  BILITOT 0.7 0.4 0.8  PREALBUMIN  --  36.5  --   TRIG  --  170*  --    Estimated Creatinine Clearance: 114.6 mL/min (by C-G formula based on Cr of 0.6).   No results for input(s): GLUCAP in the last 72 hours.   Admit: 40 yo M w/ chief complaint of stomach pain and N/V. Found to have gallstone pancreatitis and ileus. Pharmacy consulted to dose TPN for nutrition support.  Nutritional Goals: per RD 1/20 Kcal: 1900-2100 Protein: 95-110 grams Fluid: 1.9-2.1 L  Current Nutrition:  Clear liquid diet - 30-35% PO intake recorded in EPIC TPN @ 36m/hr + IVFE @ 130mhr. This provides 100 gm protein and 1894 kcals to meet 100% of goals.  D5NS @ 6060mr. This provides 245 kcal  Insulin Requirements in the past 24 hours:  None  Surgeries/Procedures: Plan for cholecystectomy on another visit once more  stable  GI:  Severe acute gallstone pancreatitis complicated by pancreatic necrosis and possible ductal disruption, for cholecystectomy once pancreatitis is better.Prealbumin improved to 36.5. Albumin 3.3. GI and nutrition recommended enteral feeding but failed NGT placement 1/17 2nd N/V, & was pulled. Patient had (+)abd pain with po intake, then changed to NPO again. Now clear liquids ordered and 30% intake recorded. Pt abdominal pain markedly improved per MD and persistent nausea. Received palonosetron once 1/29 since not responding to zofran, compazine or phenergan but continues to be nauseous. Last BM was 1/29. RD note 1/30 weight down 19 lbs since admit and may re-estimate energy and protein needs if cont to trend down.  1/13: CT (+)sig inc peripancreatic & RP fluid - c/w infarction or early necrosis. 1/28: CT scan of abdomen confirms evolving pseudocyst  Endo:  no hx DM, Glucose 114 w/ TPN at goal, SSI and CBGs stopped.  Lytes:  K 3.2, Phos 4.4, Mg 1.9, corr Ca 9.2  Renal:  SCr wnl, CrCl > 100m57mn.  D5NS at 60 ml/hr  Pulm:  [place]-2L  Cards: VSS  Hepatobil:  PMH HLD, on fenofibrate and atorvastatin PTA.  AST/ALT elevated but trending down - possible inflammation around liver vs. Fluconazole. Fluconazole stopped. Alk Phos wnl. TBili ok at 0.8. Triglycerides down to 170.  Neuro: Dilaudid PCA stopped and switched fo prn morphine for pain as this may be causing N/V.    ID:  S/p Primaxin for necrosis with acute pancreatitis. The new CT showed evidence of necrosis in tail of pancreas. AFeb in last 24hr. WBC down trending. Was on fluconazole for majocchi's granuloma but d/c'd to rule this out as cause for nausea, plan to start itraconazole once he can swallow  1/13 Imipenem >> 1/20 1/19 fluconazole>>1/29  Best Practices: enoxaparin 40 mg/d  TPN Access: PICC placed 1/13 TPN start date: 1/13 >>  Plan:  -Continue Clinimix-E 5/15 at 83 ml/hr + IVFE at 10 ml/hr -Monitor TG closely  -Spoke  with Dr. Melburn Hake to clarify fluid goal - will keep ~2 L -Discontinue D5NS -TPN provides 100 g of protein and 1894 kcal which meets 100% of patient needs -Give 10 meq KCl IV x6 -Continue MVI and trace elements in TPN -F/U TPN labs, CMP, Mg -F/U if N/V improves with above listed changes   [facility], [place].D., BCPS Clinical Pharmacist Pager: [phone] 01/28/2016 8:53 AM

## 2016-01-28 NOTE — Progress Notes (Signed)
Patient ID: Drew Prince, male   DOB: 05/20/1976, 40 y.o.   MRN: [id number]   Subjective: Drew Prince nausea is gradually resolving and his abdominal pain remained well-controlled with minimal opiates. He's still having bowel movements. He's complaining of some odynophagia that he's had since he got the nasogastric tube placed that's bothersome but not particularly painful. He denies any hematemesis.   Objective: Vital signs in last 24 hours: Filed Vitals:   01/27/16 2239 01/28/16 0154 01/28/16 0446 01/28/16 1024  BP: 138/98 155/97 139/85 145/94  Pulse: 91 95 88 86  Temp: 98.8 F (37.1 C) 98.4 F (36.9 C) 98.1 F (36.7 C) 98.4 F (36.9 C)  TempSrc: Oral Oral Oral Oral  Resp: Height:      Weight:      SpO2: 98% 99% 99% 100%   Physical exam: General: [name] man more alert today, looking generally better than yesterday Cardiac: regular rate and rhythm, no rubs, murmurs or gallops Pulm: breathing well, clear bilaterally Abd: bowel sounds normal, non-distended, still tender in the left lower quadrant, still slowly improving Ext: warm and well perfused, without pedal edema or calf tenderness  Lab Results: Basic Metabolic Panel:  Recent Labs Lab 01/23/16 1045 01/24/16 0330  01/27/16 0408 01/28/16 0420  NA 138 141  < > 137 135  K 3.5 3.3*  < > 3.2* 3.2*  CL 101 102  < > 100* 97*  CO2 27 30  < > 27 27  GLUCOSE 154* 153*  < > 134* 114*  BUN 11 12  < > 10 10  CREATININE 0.74 0.81  < > 0.65 0.60*  CALCIUM 9.3 9.2  < > 8.8* 9.0  MG 1.7 2.0  --  1.9  --   PHOS 3.0  --   --  4.4  --   < > = values in this interval not displayed.   Liver Function Tests:  Recent Labs Lab 01/27/16 0408 01/28/16 0420  AST 71* 66*  ALT 186* 193*  ALKPHOS 92 86  BILITOT 0.4 0.8  PROT 6.6 6.8  ALBUMIN 3.2* 3.3*   CBC:  Recent Labs Lab 01/27/16 0408 01/28/16 0420  WBC 13.8* 12.8*  HGB 13.3 13.0  HCT 39.2 38.9*  MCV 78.6 78.0  PLT 364 382   Medications: I have  reviewed the patient's current medications. Scheduled Meds: . enoxaparin (LOVENOX) injection  40 mg Subcutaneous Q24H  . polyethylene glycol  17 g Oral Daily  . potassium chloride  10 mEq Intravenous Q1 Hr x 6  . senna-docusate  1 tablet Oral BID   Continuous Infusions: . Marland KitchenTPN (CLINIMIX-E) Adult 83 mL/hr at 01/27/16 1822   And  . fat emulsion 240 mL (01/27/16 1823)  . Marland KitchenTPN (CLINIMIX-E) Adult     And  . fat emulsion     PRN Meds:.diphenhydrAMINE, morphine injection, promethazine, sodium chloride   Assessment/Plan:  Acute pancreatitis with necrosis and pseudocyst: His abdominal pain and nausea is improving today since getting palonosetron yesterday. His transaminitis and leukocytosis are slowly normalizing as well. I think we may finally start heading in the right direction. -Advance diet as tolerated per the patient's ability -Ambulate as much as possible to prevent venous thrombosis  -Pharmacy will help tailor his TPN and fluids so is getting about 2 L fluids per day -Will need outpatient cholecystectomy once improved -Re-consulted physical therapy to get him moving now that he's feeling a bit better  Majocchi's granuloma: Stopped fluconazole. Once he can tolerate pills,  we can try itraconazole. -Can start itraconazole once he can swallow  Dispo: Disposition is deferred at this time, awaiting improvement of current medical problems.   The patient does have a current PCP [facility]) and does need an [facility] hospital follow-up appointment after discharge.  The patient does not know have transportation limitations that hinder transportation to clinic appointments.  .Services Needed at time of discharge: Y = Yes, Blank = No PT:   OT:   RN:   Equipment:   Other:     LOS: 23 days   Selina Cooley, MD 01/28/2016, 11:02 AM

## 2016-01-29 DIAGNOSIS — F329 Major depressive disorder, single episode, unspecified: Secondary | ICD-10-CM

## 2016-01-29 DIAGNOSIS — R1013 Epigastric pain: Secondary | ICD-10-CM

## 2016-01-29 DIAGNOSIS — R1310 Dysphagia, unspecified: Secondary | ICD-10-CM

## 2016-01-29 LAB — CBC
HEMATOCRIT: 42.5 % (ref 39.0–52.0)
HEMOGLOBIN: 14.4 g/dL (ref 13.0–17.0)
MCH: 26.8 pg (ref 26.0–34.0)
MCHC: 33.9 g/dL (ref 30.0–36.0)
MCV: 79 fL (ref 78.0–100.0)
Platelets: 354 10*3/uL (ref 150–400)
RBC: 5.38 MIL/uL (ref 4.22–5.81)
RDW: 14 % (ref 11.5–15.5)
WBC: 13.2 10*3/uL — ABNORMAL HIGH (ref 4.0–10.5)

## 2016-01-29 LAB — COMPREHENSIVE METABOLIC PANEL
ALK PHOS: 91 U/L (ref 38–126)
ALT: 157 U/L — AB (ref 17–63)
AST: 48 U/L — ABNORMAL HIGH (ref 15–41)
Albumin: 3.6 g/dL (ref 3.5–5.0)
Anion gap: 11 (ref 5–15)
BILIRUBIN TOTAL: 0.8 mg/dL (ref 0.3–1.2)
BUN: 12 mg/dL (ref 6–20)
CALCIUM: 9 mg/dL (ref 8.9–10.3)
CO2: 27 mmol/L (ref 22–32)
CREATININE: 0.75 mg/dL (ref 0.61–1.24)
Chloride: 98 mmol/L — ABNORMAL LOW (ref 101–111)
GFR calc non Af Amer: >60 mL/min (ref >60)
GLUCOSE: 146 mg/dL — AB (ref 65–99)
Potassium: 3.5 mmol/L (ref 3.5–5.1)
SODIUM: 136 mmol/L (ref 135–145)
TOTAL PROTEIN: 7.2 g/dL (ref 6.5–8.1)

## 2016-01-29 LAB — MAGNESIUM: Magnesium: 2.3 mg/dL (ref 1.7–2.4)

## 2016-01-29 MED ORDER — CLINIMIX E/DEXTROSE (5/15) 5 % IV SOLN
INTRAVENOUS | Status: AC
Start: 2016-01-29 — End: 2016-01-30
  Administered 2016-01-29: 17:00:00 via INTRAVENOUS
  Filled 2016-01-29: qty 1992

## 2016-01-29 MED ORDER — PANTOPRAZOLE SODIUM 40 MG PO PACK
40.0000 mg | PACK | Freq: Every day | ORAL | Status: DC
  Administered 2016-01-29 – 2016-02-02 (×5): 40 mg
  Filled 2016-01-29 (×5): qty 20

## 2016-01-29 MED ORDER — FAT EMULSION 20 % IV EMUL
240.0000 mL | INTRAVENOUS | Status: AC
  Administered 2016-01-29: 240 mL via INTRAVENOUS
  Filled 2016-01-29: qty 250

## 2016-01-29 MED ORDER — POTASSIUM CHLORIDE 10 MEQ/50ML IV SOLN
10.0000 meq | INTRAVENOUS | Status: AC
  Administered 2016-01-29 (×5): 10 meq via INTRAVENOUS
  Filled 2016-01-29 (×5): qty 50

## 2016-01-29 MED ORDER — ADULT MULTIVITAMIN W/MINERALS CH
1.0000 | ORAL_TABLET | Freq: Every day | ORAL | Status: DC
  Administered 2016-01-29 – 2016-01-30 (×2): 1 via ORAL
  Filled 2016-01-29 (×2): qty 1

## 2016-01-29 NOTE — Progress Notes (Signed)
Patient ID: Nadeem Romanoski, male   DOB: 18-Dec-1976, 40 y.o.   MRN: [id number]   Subjective: Mr. Lorenson nausea is finally improving. His abdominal pain remains the same but is well-controlled on low-dose morphine. Today we talked about how he's feeling despondent about improving; he'd like to talk to a psychologist. He had depression in the past and was on bupriopion prior to admission. He's also asking to take a shower so I told him we'd try to make this happen. Finally he's complaining of some mild odynophagia he's had in the past that he thinks is related to dyspepsia. He had a scope that showed GERD years ago. We'll start pantoprazole to see if it helps.  Objective: Vital signs in last 24 hours: Filed Vitals:   01/28/16 1352 01/28/16 1800 01/28/16 1939 01/29/16 0440  BP: 137/85 133/88 134/92 140/94  Pulse: 90 85 87 95  Temp: 99 F (37.2 C) 98.3 F (36.8 C) 98.7 F (37.1 C) 98.4 F (36.9 C)  TempSrc: Oral Oral Oral Oral  Resp: Height:      Weight:      SpO2: 99% 96% 100% 100%   Physical exam: General: [name] man more alert today Cardiac: regular rate and rhythm, no rubs, murmurs or gallops Pulm: breathing well, clear bilaterally Abd: bowel sounds normal, non-distended, still tender in the left lower quadrant, still slowly improving Ext: warm and well perfused, without pedal edema or calf tenderness  Lab Results: Basic Metabolic Panel:  Recent Labs Lab 01/23/16 1045  01/27/16 0408 01/28/16 0420 01/29/16 0530  NA 138  < > 137 135 136  K 3.5  < > 3.2* 3.2* 3.5  CL 101  < > 100* 97* 98*  CO2 27  < > GLUCOSE 154*  < > 134* 114* 146*  BUN 11  < > CREATININE 0.74  < > 0.65 0.60* 0.75  CALCIUM 9.3  < > 8.8* 9.0 9.0  MG 1.7  < > 1.9  --  2.3  PHOS 3.0  --  4.4  --   --   < > = values in this interval not displayed.   Liver Function Tests:  Recent Labs Lab 01/28/16 0420 01/29/16 0530  AST 66* 48*  ALT 193* 157*  ALKPHOS 86 91    BILITOT 0.8 0.8  PROT 6.8 7.2  ALBUMIN 3.3* 3.6   CBC:  Recent Labs Lab 01/28/16 0420 01/29/16 0530  WBC 12.8* 13.2*  HGB 13.0 14.4  HCT 38.9* 42.5  MCV 78.0 79.0  PLT 382 354   Medications: I have reviewed the patient's current medications. Scheduled Meds: . enoxaparin (LOVENOX) injection  40 mg Subcutaneous Q24H  . pantoprazole sodium  40 mg Per Tube Daily  . polyethylene glycol  17 g Oral Daily  . senna-docusate  1 tablet Oral BID   Continuous Infusions: . Marland KitchenTPN (CLINIMIX-E) Adult 83 mL/hr at 01/28/16 1835   And  . fat emulsion 240 mL (01/28/16 1835)   PRN Meds:.diphenhydrAMINE, morphine injection, promethazine, sodium chloride   Assessment/Plan:  Acute pancreatitis with necrosis and pseudocyst: His nausea has resolved, and his abdominal pain continues to improve had an incredibly slow rate. His transaminitis and leukocytosis are slowly normalizing as well. I think we may finally start heading in the right direction. We'll continue conservative management. -Advance diet as tolerated -Ambulate as much as possible -Continue TPN -Will need outpatient cholecystectomy once improved  Depressed affect: He is quite despondent  about improving. He was on bupropion prior to admission. He would like to speak to a psychologist, so I placed a consult. He is requesting to take a shower, so I talked to his nurse Gregary Signs about making this happened. -Consult psychology -Allow him to shower  Odynophagia and dyspepsia: This could be related to the nasogastric tube that was placed, but he's also been having some mild dyspepsia as well. He had an endoscopy years ago that showed reflux esophagitis, per his report, without any structural lesions. I've started him on liquid pantoprazole to see if it helps. If he continues to have problems, he may need another endoscopy. -Started pantoprazole  daily  Majocchi's granuloma: Stopped fluconazole. Once he can tolerate pills, we can try  itraconazole. -Can start itraconazole once he can swallow  Dispo: Disposition is deferred at this time, awaiting improvement of current medical problems.   The patient does have a current PCP [facility]) and does need an [facility] hospital follow-up appointment after discharge.  The patient does not know have transportation limitations that hinder transportation to clinic appointments.  .Services Needed at time of discharge: Y = Yes, Blank = No PT:   OT:   RN:   Equipment:   Other:     LOS: 24 days   Selina Cooley, MD 01/29/2016, 8:04 AM

## 2016-01-29 NOTE — Progress Notes (Signed)
PARENTERAL NUTRITION CONSULT NOTE - FOLLOW UP  Pharmacy Consult for TPN Indication: Pancreatitis  Allergies  Allergen Reactions  . Penicillins Rash    Patient Measurements: Height: _0  (162.6 cm) Weight: 167 lb 15.9 oz (76.2 kg) IBW/kg (Calculated) : 59.2  Vital Signs: Temp: 98.4 F (36.9 C) (02/01 0440) Temp Source: Oral (02/01 0440) BP: 140/94 mmHg (02/01 0440) Pulse Rate: 95 (02/01 0440) Intake/Output from previous day: 01/31 0701 - 02/01 0700 In: 480 [P.O.:480] Out: -  Intake/Output from this shift:    Labs:  Recent Labs  01/27/16 0408 01/28/16 0420 01/29/16 0530  WBC 13.8* 12.8* 13.2*  HGB 13.3 13.0 14.4  HCT 39.2 38.9* 42.5  PLT 364 382 354     Recent Labs  01/27/16 0408 01/28/16 0420 01/29/16 0530  NA 137 135 136  K 3.2* 3.2* 3.5  CL 100* 97* 98*  CO2 _1 GLUCOSE 134* 114* 146*  BUN _2 CREATININE 0.65 0.60* 0.75  CALCIUM 8.8* 9.0 9.0  MG 1.9  --  2.3  PHOS 4.4  --   --   PROT 6.6 6.8 7.2  ALBUMIN 3.2* 3.3* 3.6  AST 71* 66* 48*  ALT 186* 193* 157*  ALKPHOS 92 86 91  BILITOT 0.4 0.8 0.8  PREALBUMIN 36.5  --   --   TRIG 170*  --   --    Estimated Creatinine Clearance: 114.6 mL/min (by C-G formula based on Cr of 0.75).   No results for input(s): GLUCAP in the last 72 hours.   Admit: 40 yo M w/ chief complaint of stomach pain and N/V. Found to have gallstone pancreatitis and ileus. Pharmacy consulted to dose TPN for nutrition support.  Nutritional Goals: per RD 1/30 Kcal: 1900-2100 Protein: 95-110 grams Fluid: 1.9-2.1 L  Current Nutrition:  Clear liquid diet - 30-40% PO intake, 480 mls recorded in EPIC TPN @ 53m/hr + IVFE @ 138mhr. This provides 100 gm protein and 1894 kcals to meet 100% of goals.   Insulin Requirements in the past 24 hours:  None  Surgeries/Procedures: Plan for cholecystectomy on another visit once more stable  GI:  Severe acute gallstone pancreatitis complicated by pancreatic necrosis  and possible ductal disruption, for cholecystectomy once pancreatitis is better.Prealbumin improved to 36.5. Albumin 3.6. GI and nutrition recommended enteral feeding but failed NGT placement 1/17 2nd N/V, & was pulled. Patient had (+)abd pain with po intake, then changed to NPO again. Now clear liquids ordered and 30-40% intake recorded. Received palonosetron once 1/29 since not responding to zofran, compazine or phenergan. Nausea gradually resolving and abd pain well-controlled with minimal opiates.  Last BM was 1/29. RD note 1/30 weight down 19 lbs since admit and may re-estimate energy and protein needs if cont to trend down.  1/13: CT (+)sig inc peripancreatic & RP fluid - c/w infarction or early necrosis. 1/28: CT scan of abdomen confirms evolving pseudocyst  Endo:  no hx DM, SSI and CBGs stopped.  Lytes:  K 3.5 after 6 runs yesterday, Mg 2.3,  Renal:  SCr wnl, CrCl > 10038min.  D5NS at 60 ml/hr stopped 1/31 am.   Pulm:  [place]-2L  Cards: VSS  Hepatobil:  PMH HLD, on fenofibrate and atorvastatin PTA.  AST/ALT elevated but trending down - possible inflammation around liver vs. Fluconazole. Fluconazole stopped. Alk Phos wnl. TBili ok at 0.8. Triglycerides down to 170.  Neuro: Dilaudid PCA stopped and switched fo prn morphine for pain as this may be causing N/V.  ID:  S/p Primaxin for necrosis with acute pancreatitis. The new CT showed evidence of necrosis in tail of pancreas. AFeb in last 24hr. WBC stable at 13.2. Was on fluconazole for majocchi's granuloma but d/c'd to rule this out as cause for nausea, plan to start itraconazole once he can swallow  1/13 Imipenem >> 1/20 1/19 fluconazole>>1/29  Best Practices: enoxaparin 40 mg/d  TPN Access: PICC placed 1/13 TPN start date: 1/13 >>  Plan:  -Continue Clinimix-E 5/15 at 83 ml/hr + IVFE at 10 ml/hr and clear liquid diet, - support with TPN until tolerating PO - consider changing to cyclic TPN rate if home TPN is a  consideration -TPN provides 100 g of protein and 1894 kcal which meets 100% of patient needs -Give 10 meq KCl IV x 5 -take MVI and trace elements out of TPN and give PO -F/U BMET, mag/phos  Eudelia Bunch, Pharm.D. [phone] 01/29/2016 8:38 AM

## 2016-01-30 LAB — BASIC METABOLIC PANEL
Anion gap: 10 (ref 5–15)
BUN: 11 mg/dL (ref 6–20)
CALCIUM: 9.2 mg/dL (ref 8.9–10.3)
CO2: 27 mmol/L (ref 22–32)
Chloride: 100 mmol/L — ABNORMAL LOW (ref 101–111)
Creatinine, Ser: 0.81 mg/dL (ref 0.61–1.24)
GFR calc Af Amer: >60 mL/min (ref >60)
GFR calc non Af Amer: >60 mL/min (ref >60)
GLUCOSE: 139 mg/dL — AB (ref 65–99)
Potassium: 3.9 mmol/L (ref 3.5–5.1)
SODIUM: 137 mmol/L (ref 135–145)

## 2016-01-30 LAB — PHOSPHORUS: PHOSPHORUS: 5.9 mg/dL — AB (ref 2.5–4.6)

## 2016-01-30 LAB — MAGNESIUM: Magnesium: 2.3 mg/dL (ref 1.7–2.4)

## 2016-01-30 MED ORDER — FAT EMULSION 20 % IV EMUL
240.0000 mL | INTRAVENOUS | Status: AC
  Administered 2016-01-30: 240 mL via INTRAVENOUS
  Filled 2016-01-30: qty 250

## 2016-01-30 MED ORDER — TRACE MINERALS CR-CU-MN-SE-ZN 10-1000-500-60 MCG/ML IV SOLN
INTRAVENOUS | Status: AC
  Administered 2016-01-30: 18:00:00 via INTRAVENOUS
  Filled 2016-01-30: qty 1992

## 2016-01-30 MED ORDER — ADULT MULTIVITAMIN LIQUID CH: 5.0000 mL | Freq: Every day | ORAL | Status: DC

## 2016-01-30 MED ORDER — ADULT MULTIVITAMIN W/MINERALS CH
1.0000 | ORAL_TABLET | Freq: Every day | ORAL | Status: DC
  Administered 2016-01-31 – 2016-02-03 (×4): 1 via ORAL
  Filled 2016-01-30 (×4): qty 1

## 2016-01-30 MED ORDER — ADULT MULTIVITAMIN W/MINERALS CH: 1.0000 | ORAL_TABLET | Freq: Every day | ORAL | Status: DC

## 2016-01-30 MED ORDER — OXYCODONE HCL 5 MG PO TABS
5.0000 mg | ORAL_TABLET | ORAL | Status: DC | PRN
  Administered 2016-01-30: 10 mg via ORAL
  Administered 2016-01-31: 5 mg via ORAL
  Administered 2016-01-31 – 2016-02-02 (×4): 10 mg via ORAL
  Administered 2016-02-02: 5 mg via ORAL
  Administered 2016-02-02: 10 mg via ORAL
  Administered 2016-02-03: 5 mg via ORAL
  Administered 2016-02-03 (×2): 10 mg via ORAL
  Filled 2016-01-30: qty 2
  Filled 2016-01-30: qty 1
  Filled 2016-01-30 (×7): qty 2
  Filled 2016-01-30: qty 1
  Filled 2016-01-30: qty 2

## 2016-01-30 NOTE — Progress Notes (Signed)
Physical Therapy Treatment Patient Details Name: Drew Prince MRN: [id number] DOB: Nov 27, 1976 Today's Date: 02-28-16    History of Present Illness Pt is a 40 y/o male who presents with acute moderate pancreatitis.     PT Comments    Patient's mood slightly brighter. Reports he has only walked in halls at night when he cannot sleep. Stated it's not much different to walk in halls than in his room. Showed pt the walkway to [address] with large windows and he was appreciative as he did not know about this area. Patient reports exercises remain challenging and did not want to increase/change them. He agreed it would be helpful if PT checked on him 1x/week to help make him accountable and to progress activity/exercises.    Follow Up Recommendations  No PT follow up     Equipment Recommendations  None recommended by PT    Recommendations for Other Services       Precautions / Restrictions Precautions Precautions: Fall Restrictions Weight Bearing Restrictions: No    Mobility  Bed Mobility Overal bed mobility: Modified Independent             General bed mobility comments: managing linens and lines  Transfers Overall transfer level: Independent Equipment used: None   Sit to Stand: Independent         General transfer comment: pt able to stand from bed and toilet independently  Ambulation/Gait Ambulation/Gait assistance: Modified independent (Device/Increase time) Ambulation Distance (Feet): 400 Feet Assistive device:  (pushing IV pole) Gait Pattern/deviations: WFL(Within Functional Limits);Decreased stride length (due to pushing IV) Gait velocity: Decreased       Stairs            Wheelchair Mobility    Modified Rankin (Stroke Patients Only)       Balance Overall balance assessment: No apparent balance deficits (not formally assessed)                                  Cognition Arousal/Alertness: Awake/alert Behavior During Therapy:  Flat affect Overall Cognitive Status: Within Functional Limits for tasks assessed                      Exercises      General Comments        Pertinent Vitals/Pain Pain Assessment: No/denies pain    Home Living                      Prior Function            PT Goals (current goals can now be found in the care plan section) Acute Rehab PT Goals Patient Stated Goal: Decrease pain Time For Goal Achievement: 02/11/16 Progress towards PT goals: Progressing toward goals    Frequency  Min 1X/week    PT Plan Frequency needs to be updated    Co-evaluation             End of Session   Activity Tolerance: Patient tolerated treatment well Patient left: in chair;with call bell/phone within reach     Time: 4098-1191 PT Time Calculation (min) (ACUTE ONLY): 14 min  Charges:  $Gait Training: 8-22 mins                    G Codes:      Drew Prince 02-28-2016, 5:19 PM Pager [phone]

## 2016-01-30 NOTE — Progress Notes (Signed)
PARENTERAL NUTRITION CONSULT NOTE - FOLLOW UP  Pharmacy Consult for TPN Indication: Pancreatitis  Allergies  Allergen Reactions  . Penicillins Rash    Patient Measurements: Height: _0  (162.6 cm) Weight: 167 lb 15.9 oz (76.2 kg) IBW/kg (Calculated) : 59.2  Vital Signs: Temp: 98.8 F (37.1 C) (02/02 0510) Temp Source: Oral (02/02 0510) BP: 127/81 mmHg (02/02 0510) Pulse Rate: 88 (02/02 0510) Intake/Output from previous day: 02/01 0701 - 02/02 0700 In: -  Out: 1500 [Urine:1500] Intake/Output from this shift:    Labs:  Recent Labs  01/28/16 0420 01/29/16 0530  WBC 12.8* 13.2*  HGB 13.0 14.4  HCT 38.9* 42.5  PLT 382 354     Recent Labs  01/28/16 0420 01/29/16 0530 01/30/16 0544  NA 135 136 137  K 3.2* 3.5 3.9  CL 97* 98* 100*  CO2 _1 GLUCOSE 114* 146* 139*  BUN _2 CREATININE 0.60* 0.75 0.81  CALCIUM 9.0 9.0 9.2  MG  --  2.3 2.3  PHOS  --   --  5.9*  PROT 6.8 7.2  --   ALBUMIN 3.3* 3.6  --   AST 66* 48*  --   ALT 193* 157*  --   ALKPHOS 86 91  --   BILITOT 0.8 0.8  --    Estimated Creatinine Clearance: 113.2 mL/min (by C-G formula based on Cr of 0.81).   No results for input(s): GLUCAP in the last 72 hours.   Admit: 40 yo M w/ chief complaint of stomach pain and N/V. Found to have gallstone pancreatitis and ileus. Pharmacy consulted to dose TPN for nutrition support.  Nutritional Goals: per RD 1/30 Kcal: 1900-2100 Protein: 95-110 grams Fluid: 1.9-2.1 L  Current Nutrition:  Full liquids as of 2/1 afternoon  TPN @ 41m/hr + IVFE @ 114mhr. This provides 100 gm protein and 1894 kcals to meet 100% of goals.   Insulin Requirements in the past 24 hours:  None  Surgeries/Procedures: Plan for cholecystectomy on another visit once more stable  GI:  Severe acute gallstone pancreatitis complicated by pancreatic necrosis and possible ductal disruption, for cholecystectomy once pancreatitis is better.Prealbumin improved to  36.5. Albumin 3.6. GI and nutrition recommended enteral feeding but failed NGT placement 1/17 2nd N/V, & was pulled. Patient had (+)abd pain with po intake, then changed to NPO again. Now clear liquids ordered and 30-40% intake recorded. Received palonosetron once 1/29 since not responding to zofran, compazine or phenergan. Nausea gradually resolving and abd pain well-controlled with minimal opiates.  Last BM was 2/1. RD note 1/30 weight down 19 lbs since admit and may re-estimate energy and protein needs if cont to trend down.  1/13: CT (+)sig inc peripancreatic & RP fluid - c/w infarction or early necrosis. 1/28: CT scan of abdomen confirms evolving pseudocyst  Endo:  no hx DM, SSI and CBGs stopped.  Lytes:  K 3.9 after 5 runs yesterday, Mg 2.3, Phos elevated at 5.9. Ca 9.2. CaxPhos product ~55. May need to take out electrolytes if continues to rise.  Renal:  SCr wnl, CrCl > 10051min.  D5NS at 60 ml/hr stopped 1/31 am.   Pulm:  [place]-2L  Cards: VSS  Hepatobil:  PMH HLD, on fenofibrate and atorvastatin PTA.  AST/ALT elevated but trending down - possible inflammation around liver vs. Fluconazole. Fluconazole stopped. Alk Phos wnl. TBili ok at 0.8. Triglycerides down to 170.  Neuro: Dilaudid PCA stopped and switched fo prn morphine for pain as this may be  causing N/V.    ID:  S/p Primaxin for necrosis with acute pancreatitis. The new CT showed evidence of necrosis in tail of pancreas. AFeb in last 24hr. WBC stable at 13.2. Was on fluconazole for majocchi's granuloma but d/c'd to rule this out as cause for nausea, plan to start itraconazole once he can swallow  1/13 Imipenem >> 1/20 1/19 fluconazole>>1/29  Best Practices: enoxaparin 40 mg/d  TPN Access: PICC placed 1/13 TPN start date: 1/13 >>  Plan:  Continue Clinimix E 5/15 at 83 ml/hr Continue IVFE at 10 ml/hr This provides 100 g of protein and 1894 kcals meeting 100% of protein and kcal needs MVI changed to PO Continue trace  elements in TPN Monitor TPN labs, CaxPhos product F/U advancement of diet  Elenor Quinones, PharmD, BCPS Clinical Pharmacist Pager [phone] 01/30/2016 8:59 AM

## 2016-01-30 NOTE — Progress Notes (Signed)
Patient ID: Drew Prince, male   DOB: 03/22/1976, 40 y.o.   MRN: [id number]   Subjective: Drew Prince is feeling his best today since his hospitalization. He was able to eat some soup and would like to try a soft diet. His abdominal pain is slowly getting better. He'd like to try some pills and liquid meds so long as they're not too big.  Objective: Vital signs in last 24 hours: Filed Vitals:   01/29/16 1752 01/29/16 2135 01/30/16 0245 01/30/16 0510  BP:  125/86 129/83 127/81  Pulse: 98 98 93 88  Temp: 99.3 F (37.4 C) 99 F (37.2 C) 98.4 F (36.9 C) 98.8 F (37.1 C)  TempSrc: Oral Oral Oral Oral  Resp: Height:      Weight:      SpO2: 99% 98% 97% 100%   Physical exam: General: [name] man more alert and engaged today, looking the best since he's been here Cardiac: regular rate and rhythm, no rubs, murmurs or gallops Pulm: breathing well, clear bilaterally Abd: bowel sounds normal, non-distended, still tender in the left lower quadrant, still slowly improving Ext: warm and well perfused, without pedal edema or calf tenderness  Lab Results: Basic Metabolic Panel:  Recent Labs Lab 01/27/16 0408  01/29/16 0530 01/30/16 0544  NA 137  < > 136 137  K 3.2*  < > 3.5 3.9  CL 100*  < > 98* 100*  CO2 27  < > 27 27  GLUCOSE 134*  < > 146* 139*  BUN 10  < > 12 11  CREATININE 0.65  < > 0.75 0.81  CALCIUM 8.8*  < > 9.0 9.2  MG 1.9  --  2.3 2.3  PHOS 4.4  --   --  5.9*  < > = values in this interval not displayed.   Medications: I have reviewed the patient's current medications. Scheduled Meds: . enoxaparin (LOVENOX) injection  40 mg Subcutaneous Q24H  . multivitamin with minerals  1 tablet Oral Daily  . pantoprazole sodium  40 mg Per Tube Daily  . polyethylene glycol  17 g Oral Daily  . senna-docusate  1 tablet Oral BID   Continuous Infusions: . Marland KitchenTPN (CLINIMIX-E) Adult 83 mL/hr at 01/29/16 1722   And  . fat emulsion 240 mL (01/29/16 1722)  . Marland KitchenTPN  (CLINIMIX-E) Adult     And  . fat emulsion     PRN Meds:.diphenhydrAMINE, morphine injection, promethazine, sodium chloride   Assessment/Plan:  Acute pancreatitis with necrosis and pseudocyst: I think he's finally on the upslope of improvement. His abdominal pain and nausea continue to improve today. We'll advance his diet per his request and see how he does. -Advance diet to softs and advance as tolerated -Changed IV morphine to PO oxycodone as needed -Ambulate as much as possible -Continue TPN -Will need outpatient cholecystectomy once improved  Depressed affect: His mood is much-improved today. I consulted psychology but haven't heard back. I'll touch base tomorrow if he gets more depressed otherwise I think he's hopeful he's improving.  Majocchi's granuloma: Stopped fluconazole. Once he can tolerate pills, we can try itraconazole. -Can start itraconazole once he can swallow   Dispo: Disposition is deferred at this time, awaiting improvement of current medical problems.   The patient does have a current PCP [facility]) and does need an [facility] hospital follow-up appointment after discharge.  The patient does not know have transportation limitations that hinder transportation to clinic appointments.  .Services  Needed at time of discharge: Y = Yes, Blank = No PT:   OT:   RN:   Equipment:   Other:     LOS: 25 days   Selina Cooley, MD 01/30/2016, 9:34 AM

## 2016-01-30 NOTE — Progress Notes (Signed)
Nutrition Follow-up  DOCUMENTATION CODES:   Obesity unspecified  INTERVENTION:  Provide snacks BID TPN per pharmacy; recommend weaning rate   NUTRITION DIAGNOSIS:   Inadequate oral intake related to altered GI function as evidenced by NPO status.  Ongoing  GOAL:   Patient will meet greater than or equal to 90% of their needs  Being met  MONITOR:   Diet advancement, Labs, Weight trends, Skin, I & O's  REASON FOR ASSESSMENT:   Consult Enteral/tube feeding initiation and management  ASSESSMENT:   40 year old Asian man presented to the emergency department and was admitted with a diagnosis of acute pancreatitis. He has had diffuse abdominal pain with associated nausea and vomiting for about 1 day. Otherwise has been in his usual state of health. Negative alcohol history. No new prescription or nonprescription medications.   Pt's diet was advanced to full liquids yesterday afternoon and Soft diet this morning. He continues to receive TPN at goal rate which is meeting 100% of energy and protein needs. Pt reports eating a grilled cheese sandwich for lunch and denied any nausea or abdominal pain after eating; reports early satiety.  RD encouraged eating smaller more frequent meals/snacks while TPN is weaned and PO intake returns to normal.   Labs: elevated phosphorus, ALT/AST trending down  Diet Order:  TPN (CLINIMIX-E) Adult TPN (CLINIMIX-E) Adult DIET SOFT Room service appropriate?: Yes; Fluid consistency:: Thin  Skin:  Reviewed, no issues  Last BM:  2/1  Height:   Ht Readings from Last 1 Encounters:  01/10/16 _0  (1.626 m)    Weight:   Wt Readings from Last 1 Encounters:  01/20/16 167 lb 15.9 oz (76.2 kg)    Ideal Body Weight:  59.1 kg  BMI:  Body mass index is 28.82 kg/(m^2).  Estimated Nutritional Needs:   Kcal:  1900-2100  Protein:  95-110 grams  Fluid:  1.9-2.1 L  EDUCATION NEEDS:   No education needs identified at this time  [place], LDN Inpatient Clinical Dietitian Pager: [phone] After Hours Pager: [phone]

## 2016-01-31 LAB — COMPREHENSIVE METABOLIC PANEL
ALBUMIN: 3.4 g/dL — AB (ref 3.5–5.0)
ALT: 111 U/L — ABNORMAL HIGH (ref 17–63)
AST: 34 U/L (ref 15–41)
Alkaline Phosphatase: 80 U/L (ref 38–126)
Anion gap: 10 (ref 5–15)
BUN: 10 mg/dL (ref 6–20)
CHLORIDE: 99 mmol/L — AB (ref 101–111)
CO2: 29 mmol/L (ref 22–32)
Calcium: 9.3 mg/dL (ref 8.9–10.3)
Creatinine, Ser: 0.8 mg/dL (ref 0.61–1.24)
GFR calc Af Amer: >60 mL/min (ref >60)
GFR calc non Af Amer: >60 mL/min (ref >60)
GLUCOSE: 132 mg/dL — AB (ref 65–99)
POTASSIUM: 4 mmol/L (ref 3.5–5.1)
Sodium: 138 mmol/L (ref 135–145)
Total Bilirubin: 1 mg/dL (ref 0.3–1.2)
Total Protein: 7.3 g/dL (ref 6.5–8.1)

## 2016-01-31 LAB — PHOSPHORUS: Phosphorus: 5.7 mg/dL — ABNORMAL HIGH (ref 2.5–4.6)

## 2016-01-31 MED ORDER — FAT EMULSION 20 % IV EMUL
120.0000 mL | INTRAVENOUS | Status: DC
  Administered 2016-01-31: 120 mL via INTRAVENOUS
  Filled 2016-01-31: qty 200

## 2016-01-31 MED ORDER — CLINIMIX E/DEXTROSE (5/15) 5 % IV SOLN
INTRAVENOUS | Status: DC
  Administered 2016-01-31: 18:00:00 via INTRAVENOUS
  Filled 2016-01-31: qty 960

## 2016-01-31 NOTE — Progress Notes (Signed)
PARENTERAL NUTRITION CONSULT NOTE - FOLLOW UP  Pharmacy Consult for TPN Indication: Pancreatitis  Allergies  Allergen Reactions  . Penicillins Rash    Patient Measurements: Height: _0  (162.6 cm) Weight: 167 lb 15.9 oz (76.2 kg) IBW/kg (Calculated) : 59.2  Vital Signs: Temp: 97 F (36.1 C) (02/03 0605) Temp Source: Oral (02/03 0605) BP: 130/87 mmHg (02/03 0605) Pulse Rate: 97 (02/03 0605) Intake/Output from previous day: 02/02 0701 - 02/03 0700 In: 1200 [P.O.:1200] Out: 475 [Urine:475] Intake/Output from this shift:    Labs:  Recent Labs  01/29/16 0530  WBC 13.2*  HGB 14.4  HCT 42.5  PLT 354     Recent Labs  01/29/16 0530 01/30/16 0544 01/31/16 0520  NA 136 137 138  K 3.5 3.9 4.0  CL 98* 100* 99*  CO2 _1 GLUCOSE 146* 139* 132*  BUN _2 CREATININE 0.75 0.81 0.80  CALCIUM 9.0 9.2 9.3  MG 2.3 2.3  --   PHOS  --  5.9* 5.7*  PROT 7.2  --  7.3  ALBUMIN 3.6  --  3.4*  AST 48*  --  34  ALT 157*  --  111*  ALKPHOS 91  --  80  BILITOT 0.8  --  1.0   Estimated Creatinine Clearance: 114.6 mL/min (by C-G formula based on Cr of 0.8).   No results for input(s): GLUCAP in the last 72 hours.   Admit: 40 yo M w/ chief complaint of stomach pain and N/V. Found to have gallstone pancreatitis and ileus. Pharmacy consulted to dose TPN for nutrition support.  Nutritional Goals: per RD 1/30 Kcal: 1900-2100 Protein: 95-110 grams Fluid: 1.9-2.1 L  Current Nutrition:  Soft diet as of 2/2 TPN @ 70m/hr + IVFE @ 147mhr. This provides 100 gm protein and 1894 kcals to meet 100% of goals.   Insulin Requirements in the past 24 hours:  None  Surgeries/Procedures: Plan for cholecystectomy on another visit once more stable  GI:  Severe acute gallstone pancreatitis complicated by pancreatic necrosis and possible ductal disruption, for cholecystectomy once pancreatitis is better.Prealbumin improved to 36.5. Albumin 3.4. GI and nutrition recommended  enteral feeding but failed NGT placement 1/17 2nd N/V, & was pulled. Patient had (+)abd pain with po intake, then changed to NPO again. Now clear liquids ordered and 30-40% intake recorded. Received palonosetron once 1/29 since not responding to zofran, compazine or phenergan. Nausea gradually resolving and abd pain well-controlled with minimal opiates.  Last BM was 2/1. RD note 1/30 weight down 19 lbs since admit and may re-estimate energy and protein needs if cont to trend down. Reports eating a grilled cheese yesterday with no N/V but did have early satiety. Meal intake charted as 50%. No nausea and vomiting reported. RD recommends to provide snacks twice a day. Talked with MD today and would like to wean TPN slowly due to patients long hospital course of ups and downs. May be able to stop TPN on 2/4 if still tolerating diet and TPN wean  1/13: CT (+)sig inc peripancreatic & RP fluid - c/w infarction or early necrosis. 1/28: CT scan of abdomen confirms evolving pseudocyst  Endo:  no hx DM, SSI and CBGs stopped.  Lytes: wnl exc Phos 5.7. Ca 9.3. CaxPhos product ~55. Mg ok on 2/2. May need to take out electrolytes if Phos or Ca continues to rise but looks to be going down.  Renal:  SCr wnl, CrCl > 10039min. UOP low at 0.3 ml/kg/hr yesterday.  Pulm:  [place]-2L  Cards: VSS  Hepatobil:  PMH HLD, on fenofibrate and atorvastatin PTA.  AST trending down to wnl, ALT trending down to 111. Possible inflammation around liver vs. Fluconazole. Fluconazole stopped. Alk Phos wnl. TBili ok at 1.0. Triglycerides down to 170.  Neuro: Dilaudid PCA stopped and switched fo prn morphine for pain as this may be causing N/V.    ID:  S/p Primaxin for necrosis with acute pancreatitis. The new CT showed evidence of necrosis in tail of pancreas. AFeb in last 24hr. WBC stable at 13.2. Was on fluconazole for majocchi's granuloma but d/c'd to rule this out as cause for nausea, plan to start itraconazole once he can  swallow  1/13 Imipenem >> 1/20 1/19 fluconazole>>1/29  Best Practices: enoxaparin 40 mg/d  TPN Access: PICC placed 1/13 TPN start date: 1/13 >>  Plan:  Decrease Clinimix E 5/15 to 18m/hr tonight Decrease 20% IVFE to 518mhr This provides 48g of protein and 922 kcals meeting ~50% of his nutrition goals PO intake to meet the rest of goals MVI changed to PO Continue trace elements in TPN Monitor TPN labs F/U diet tolerance  Consider stopping TPN tomorrow if still tolerating PO intake  NaElenor QuinonesPharmD, BCPS Clinical Pharmacist Pager [phone]/02/2016 8:11 AM

## 2016-01-31 NOTE — Progress Notes (Signed)
Patient ID: Drew Prince, male   DOB: 1976-09-25, 40 y.o.   MRN: [id number]   Subjective: Drew Prince says he's feeling the best he's felt since he got here. His abdominal pain is well-controlled on the oral opiates and he was able to eat a grilled cheese yesterday. He felt a little nauseous and post-prandial cramping but it was nothing like before. He's been up and walking the halls.  Objective: Vital signs in last 24 hours: Filed Vitals:   01/30/16 1822 01/30/16 2107 01/31/16 0205 01/31/16 0605  BP: 125/84 129/85 122/84 130/87  Pulse: 104 100 89 97  Temp:  99 F (37.2 C) 98.7 F (37.1 C) 97 F (36.1 C)  TempSrc: Oral Oral Oral Oral  Resp: Height:      Weight:      SpO2: 100% 99% 100% 99%   Physical exam: General: [name] man more alert today Cardiac: regular rate and rhythm, no rubs, murmurs or gallops Pulm: breathing well, clear bilaterally Abd: bowel sounds normal, non-distended, still tender in the left lower quadrant, still slowly improving Ext: warm and well perfused, without pedal edema or calf tenderness  Lab Results: Basic Metabolic Panel:  Recent Labs Lab 01/29/16 0530 01/30/16 0544 01/31/16 0520  NA 136 137 138  K 3.5 3.9 4.0  CL 98* 100* 99*  CO2 GLUCOSE 146* 139* 132*  BUN CREATININE 0.75 0.81 0.80  CALCIUM 9.0 9.2 9.3  MG 2.3 2.3  --   PHOS  --  5.9* 5.7*    Liver Function Tests:  Recent Labs Lab 01/29/16 0530 01/31/16 0520  AST 48* 34  ALT 157* 111*  ALKPHOS 91 80  BILITOT 0.8 1.0  PROT 7.2 7.3  ALBUMIN 3.6 3.4*   Medications: I have reviewed the patient's current medications. Scheduled Meds: . enoxaparin (LOVENOX) injection  40 mg Subcutaneous Q24H  . multivitamin with minerals  1 tablet Oral Daily  . pantoprazole sodium  40 mg Per Tube Daily   Continuous Infusions: . Marland KitchenTPN (CLINIMIX-E) Adult 83 mL/hr at 01/30/16 1741   And  . fat emulsion 240 mL (01/30/16 1741)   PRN Meds:.diphenhydrAMINE,  oxyCODONE, promethazine, sodium chloride   Assessment/Plan:  Acute pancreatitis with necrosis and pseudocyst: He's looking his best since admission and I think the end is in sight; he's tolerating a soft diet and his pain controlled on oral medications. He can only eat very small bites because of post-prandial abdominal cramping and nausea. We'll keep an eye on him today. And drop his TPN rate in half overnight. If he's doing well tomorrow, we can probably stop the TPN. I'm hoping we can get him home by Monday if all continues to go well. -Advance diet as tolerated -Ambulate as much as possible -Continue TPN for now at slower rate; consider stopping tomorrow if he continues eating -Will need outpatient cholecystectomy once improved  Dispo: Disposition is deferred at this time, awaiting improvement of current medical problems.  Anticipated discharge in approximately 2-4 day(s).   The patient does have a current PCP [facility]) and does need an [facility] hospital follow-up appointment after discharge.  The patient does not know have transportation limitations that hinder transportation to clinic appointments.  .Services Needed at time of discharge: Y = Yes, Blank = No PT:   OT:   RN:   Equipment:   Other:     LOS: 26 days   Selina Cooley, MD 01/31/2016,  8:20 AM

## 2016-02-01 NOTE — Progress Notes (Signed)
Patient ID: Drew Prince, male   DOB: 1976-06-19, 40 y.o.   MRN: [id number]   Subjective: This AM, he reports feeling better. Tolerating soft diet. Plan to stop TPN today.   Objective: Vital signs in last 24 hours: Filed Vitals:   01/31/16 2134 02/01/16 0132 02/01/16 0520 02/01/16 0936  BP: 115/74 123/77 130/77 121/77  Pulse: 102 102 97 109  Temp: 99 F (37.2 C) 98.9 F (37.2 C) 97.8 F (36.6 C) 98.8 F (37.1 C)  TempSrc: Oral Oral Oral Oral  Resp: Height:      Weight:      SpO2: 97% 98% 97% 98%   Physical exam: General: [name] man, resting in bed Cardiac: regular rate and rhythm, no rubs, murmurs or gallops Pulm: breathing well, clear bilaterally Abd: bowel sounds normal, non-distended, mild tenderness in the left lower quadrant, still slowly improving Ext: warm and well perfused, without pedal edema or calf tenderness  Lab Results: Basic Metabolic Panel:  Recent Labs Lab 01/29/16 0530 01/30/16 0544 01/31/16 0520  NA 136 137 138  K 3.5 3.9 4.0  CL 98* 100* 99*  CO2 GLUCOSE 146* 139* 132*  BUN CREATININE 0.75 0.81 0.80  CALCIUM 9.0 9.2 9.3  MG 2.3 2.3  --   PHOS  --  5.9* 5.7*    Liver Function Tests:  Recent Labs Lab 01/29/16 0530 01/31/16 0520  AST 48* 34  ALT 157* 111*  ALKPHOS 91 80  BILITOT 0.8 1.0  PROT 7.2 7.3  ALBUMIN 3.6 3.4*   Medications: I have reviewed the patient's current medications. Scheduled Meds: . enoxaparin (LOVENOX) injection  40 mg Subcutaneous Q24H  . multivitamin with minerals  1 tablet Oral Daily  . pantoprazole sodium  40 mg Per Tube Daily   Continuous Infusions: . Marland KitchenTPN (CLINIMIX-E) Adult 40 mL/hr at 01/31/16 1752   And  . fat emulsion 120 mL (01/31/16 1753)   PRN Meds:.diphenhydrAMINE, oxyCODONE, promethazine, sodium chloride   Assessment/Plan: Drew Prince is a 40 year old male who presented with acute pancreatitis with necrosis and pseudocyst now showing signs of  improvement.  Acute pancreatitis with necrosis and pseudocyst: Tolerating a soft diet and his pain controlled on oral medications, now for several days. Still eating very small bites because of post-prandial abdominal cramping and nausea.  -Advance diet as tolerated -Encouraged ambulation out of bed as much as possible -Stop TPN today -Will need outpatient cholecystectomy once improved  Dispo: Disposition is deferred at this time, awaiting improvement of current medical problems.  Anticipated discharge in approximately 2-4 day(s).   The patient does have a current PCP [facility]) and does need an [facility] hospital follow-up appointment after discharge.  The patient does not know have transportation limitations that hinder transportation to clinic appointments.  .Services Needed at time of discharge: Y = Yes, Blank = No PT:   OT:   RN:   Equipment:   Other:     LOS: 27 days   Beather Arbour, MD 02/01/2016, 11:27 AM

## 2016-02-02 LAB — COMPREHENSIVE METABOLIC PANEL
ALBUMIN: 3.3 g/dL — AB (ref 3.5–5.0)
ALT: 110 U/L — AB (ref 17–63)
AST: 42 U/L — AB (ref 15–41)
Alkaline Phosphatase: 80 U/L (ref 38–126)
Anion gap: 9 (ref 5–15)
BUN: 8 mg/dL (ref 6–20)
CHLORIDE: 99 mmol/L — AB (ref 101–111)
CO2: 32 mmol/L (ref 22–32)
CREATININE: 0.79 mg/dL (ref 0.61–1.24)
Calcium: 9.1 mg/dL (ref 8.9–10.3)
GFR calc Af Amer: >60 mL/min (ref >60)
GFR calc non Af Amer: >60 mL/min (ref >60)
Glucose, Bld: 111 mg/dL — ABNORMAL HIGH (ref 65–99)
Potassium: 4.1 mmol/L (ref 3.5–5.1)
SODIUM: 140 mmol/L (ref 135–145)
Total Bilirubin: 0.5 mg/dL (ref 0.3–1.2)
Total Protein: 6.5 g/dL (ref 6.5–8.1)

## 2016-02-02 MED ORDER — ONDANSETRON 4 MG PO TBDP: 4.0000 mg | ORAL_TABLET | Freq: Three times a day (TID) | ORAL | Status: DC | PRN

## 2016-02-02 NOTE — Progress Notes (Addendum)
Face to face report with [facility]. Pt tolerating soft foods and PO med's, MD changing phenergan dose to PO. Pt is to ambulate in hall today. Possible d/c tomorrow per MD.

## 2016-02-02 NOTE — Progress Notes (Signed)
Patient ID: Drew Prince, male   DOB: [birth year], 40 y.o.   MRN: [id number]   Subjective: Drew Prince continues to improve today. His abdominal pain is well-controlled with 2 oxycodones per day and he has been eating soft foots. He's hopeful to go home tomorrow but is understandably nervous if he should worsen. He'd like to start following in our clinic so we can get him an appointment next week.  Objective: Vital signs in last 24 hours: Filed Vitals:   02/01/16 1420 02/01/16 2202 02/02/16 0200 02/02/16 0519  BP: 124/76 112/75 127/79 123/72  Pulse: 105 100 96 98  Temp: 98.6 F (37 C) 99 F (37.2 C) 98.6 F (37 C) 98.4 F (36.9 C)  TempSrc: Oral Oral Oral Oral  Resp: Height:      Weight:      SpO2: 98% 98% 98% 98%   Physical exam: General: [name] man looking well today Cardiac: regular rate and rhythm, no rubs, murmurs or gallops Pulm: breathing well, clear bilaterally Abd: bowel sounds normal, non-distended, still slightly tender in the left lower quadrant Ext: warm and well perfused, without pedal edema or calf tenderness  Lab Results: Basic Metabolic Panel:  Recent Labs Lab 01/29/16 0530 01/30/16 0544 01/31/16 0520 02/02/16 0500  NA 136 137 138 140  K 3.5 3.9 4.0 4.1  CL 98* 100* 99* 99*  CO2 32  GLUCOSE 146* 139* 132* 111*  BUN CREATININE 0.75 0.81 0.80 0.79  CALCIUM 9.0 9.2 9.3 9.1  MG 2.3 2.3  --   --   PHOS  --  5.9* 5.7*  --    Liver Function Tests:  Recent Labs Lab 01/31/16 0520 02/02/16 0500  AST 34 42*  ALT 111* 110*  ALKPHOS 80 80  BILITOT 1.0 0.5  PROT 7.3 6.5  ALBUMIN 3.4* 3.3*   Medications: I have reviewed the patient's current medications. Scheduled Meds: . enoxaparin (LOVENOX) injection  40 mg Subcutaneous Q24H  . multivitamin with minerals  1 tablet Oral Daily  . pantoprazole sodium  40 mg Per Tube Daily   Continuous Infusions:  PRN Meds:.diphenhydrAMINE, ondansetron, oxyCODONE,  promethazine, sodium chloride   Assessment/Plan:  Acute pancreatitis with necrosis and pseudocyst: He's finally looking his best since admission and I think he can likely go home tomorrow if he does well today; he's tolerating a soft diet and his pain controlled on oral medications. We'll watch him today in case he deteriorates again. He'd like to follow in our clinic so I've messaged our secretaries to schedule an appointment next week. -Advance diet as tolerated -Ambulate in hallway today -Will need outpatient cholecystectomy once improved -Remove PICC  Dispo: Disposition is deferred at this time, awaiting improvement of current medical problems.  Anticipated discharge in approximately 1-2 day(s).   He'll follow with [name] in clinic; I'll get our secretaries to schedule him an appointment for next week.  The patient does not know have transportation limitations that hinder transportation to clinic appointments.  .Services Needed at time of discharge: Y = Yes, Blank = No PT:   OT:   RN:   Equipment:   Other:     LOS: 28 days   Selina Cooley, MD 02/02/2016, 8:24 AM

## 2016-02-03 MED ORDER — PANTOPRAZOLE SODIUM 40 MG PO TBEC
40.0000 mg | DELAYED_RELEASE_TABLET | Freq: Every day | ORAL | Status: DC
  Administered 2016-02-03: 40 mg via ORAL
  Filled 2016-02-03: qty 1

## 2016-02-03 MED ORDER — OXYCODONE HCL 5 MG PO TABS: 5.0000 mg | ORAL_TABLET | ORAL | Status: DC | PRN

## 2016-02-03 MED ORDER — PANTOPRAZOLE SODIUM 40 MG PO TBEC: 40.0000 mg | DELAYED_RELEASE_TABLET | Freq: Every day | ORAL | Status: DC

## 2016-02-03 MED ORDER — ONDANSETRON 4 MG PO TBDP: 4.0000 mg | ORAL_TABLET | Freq: Three times a day (TID) | ORAL | Status: DC | PRN

## 2016-02-03 NOTE — Progress Notes (Signed)
Discharge instructions reviewed with patient. All questions answered at this time. Transport home by family.   Aayush Gelpi, RN 

## 2016-02-03 NOTE — Progress Notes (Signed)
Patient with discharge order but no transportation available until this afternoon. Charge RN aware.   Sim Boast, RN

## 2016-02-03 NOTE — Care Management Note (Signed)
Case Management Note  Patient Details  Name: Drew Prince MRN: [id number] Date of Birth: 1976/07/05  Subjective/Objective:                    Action/Plan: Plan is for patient to discharge home today with self care. No further needs per CM.  Expected Discharge Date:                  Expected Discharge Plan:  Home/Self Care  In-House Referral:     Discharge planning Services     Post Acute Care Choice:    Choice offered to:     DME Arranged:    DME Agency:     HH Arranged:    HH Agency:     Status of Service:  Completed, signed off  Medicare Important Message Given:    Date Medicare IM Given:    Medicare IM give by:    Date Additional Medicare IM Given:    Additional Medicare Important Message give by:     If discussed at Long Length of Stay Meetings, dates discussed:    Additional Comments:  Kermit Balo, RN 02/03/2016, 10:10 AM

## 2016-02-03 NOTE — Progress Notes (Signed)
Patient ID: Drew Prince, male   DOB: Sep 14, 1976, 40 y.o.   MRN: [id number]   Subjective: Mr. Lave was pretty happy about the [place] win last night. He had a little abdominal pain after eating pancake but this is well-controlled with pain meds and he's not having an nausea. He feels ready to go home and would like to see [name] in clinic next week.  Objective: Vital signs in last 24 hours: Filed Vitals:   02/02/16 2137 02/03/16 0158 02/03/16 0537 02/03/16 0954  BP: 137/80 120/69 112/73 128/84  Pulse: 108 96 95 101  Temp: 99 F (37.2 C) 98.8 F (37.1 C) 98.7 F (37.1 C) 98.1 F (36.7 C)  TempSrc: Oral Oral Oral Oral  Resp: Height:      Weight:      SpO2: 99% 97% 96% 98%   Physical exam: General: [name] man looking well today Cardiac: regular rate and rhythm, no rubs, murmurs or gallops Pulm: breathing well, clear bilaterally Abd: bowel sounds normal, non-distended, still slightly tender in the left lower quadrant Ext: warm and well perfused, without pedal edema or calf tenderness  Medications: I have reviewed the patient's current medications. Scheduled Meds: . enoxaparin (LOVENOX) injection  40 mg Subcutaneous Q24H  . multivitamin with minerals  1 tablet Oral Daily  . pantoprazole  40 mg Oral Daily   Continuous Infusions:  PRN Meds:.diphenhydrAMINE, ondansetron, oxyCODONE, promethazine, sodium chloride   Assessment/Plan:  Acute pancreatitis with necrosis and pseudocyst: He's finally eating and feeling well so he can go home today; our secretaries will schedule him an appointment in our clinic next week. He has an appointment with the general surgeons as well. -Discharge home today  Dispo: Disposition is deferred at this time, awaiting improvement of current medical problems. Anticipated discharge in approximately 1-2 day(s).   He'll follow with [name] in clinic; I'll get our secretaries to schedule him an appointment for next week.   The patient does  not know have transportation limitations that hinder transportation to clinic appointments.  .Services Needed at time of discharge: Y = Yes, Blank = No PT:   OT:   RN:   Equipment:   Other:     LOS: 29 days   Selina Cooley, MD 02/03/2016, 10:30 AM

## 2016-02-03 NOTE — Progress Notes (Signed)
Discharge instructions reviewed with patient/family. RXs given. Absent note from work given. All questions answered at this time. Transport home by family.  Sim Boast, RN

## 2016-02-03 NOTE — Discharge Instructions (Signed)

## 2016-02-10 ENCOUNTER — Encounter: Payer: Self-pay | Admitting: Internal Medicine

## 2016-02-10 ENCOUNTER — Ambulatory Visit (INDEPENDENT_AMBULATORY_CARE_PROVIDER_SITE_OTHER): Payer: BLUE CROSS/BLUE SHIELD | Admitting: Internal Medicine

## 2016-02-10 VITALS — BP 134/90 | HR 103 | Temp 98.5°F | Ht 64.0 in | Wt 166.3 lb

## 2016-02-10 DIAGNOSIS — K8591 Acute pancreatitis with uninfected necrosis, unspecified: Secondary | ICD-10-CM

## 2016-02-10 DIAGNOSIS — K8500 Idiopathic acute pancreatitis without necrosis or infection: Secondary | ICD-10-CM

## 2016-02-10 DIAGNOSIS — Z09 Encounter for follow-up examination after completed treatment for conditions other than malignant neoplasm: Secondary | ICD-10-CM | POA: Diagnosis not present

## 2016-02-10 DIAGNOSIS — I10 Essential (primary) hypertension: Secondary | ICD-10-CM

## 2016-02-10 MED ORDER — ONDANSETRON 4 MG PO TBDP: 4.0000 mg | ORAL_TABLET | Freq: Three times a day (TID) | ORAL | Status: DC | PRN

## 2016-02-10 MED ORDER — OXYCODONE HCL 5 MG PO TABS: 5.0000 mg | ORAL_TABLET | ORAL | Status: DC | PRN

## 2016-02-10 NOTE — Assessment & Plan Note (Addendum)
Acute pancreatitis with development of necrotic pancreas. He endorses continues stable pain for now, responsive for PRN oxycodone. He had an episode of severe pain, same intensity that brought him to the ED initially, and subsequently requiring admission to the hospital- 1/8 to 02/03/16. No fevr, no vomiting, well appearing. Has mild pain today. He is requesting a work note, and refill of his pain meds. Pts pancreatitis was attributed to meds- lipitor, lisinopril and fibrates. His IgG4 was negative- making Autoimmune pancreatitis less likely, triglycerides- 170- 1/30.  Plan- Pt is not ill appearing, denies any associated complaints with pain, will therefore not order lab work, lipase likely still elevated with slow resolution of his pancreas- necrotic on CT imaging. - return precautions given and emphasized , pt voiced understanding - Oxycodone 5-10mg  Q4H , #30 pills given - See in 2 weeks. - Pt to follow up with surgery for possible Cholecystectomy given non-obstructing gall stones.

## 2016-02-10 NOTE — Patient Instructions (Signed)
Please continue taking the medication to prevent constipation, as you are taking pain medications, that can make you constipated.  Also please take the pain medications. If you have severe pain let us know, if the pain is persistent, or pain not responding to the pain medications you are taking, also if you develop a fever, let us know, or vomiting, or if you feel ill, let us know.  We will see you in 2 weeks.

## 2016-02-10 NOTE — Assessment & Plan Note (Signed)
BP Readings from Last 3 Encounters:  02/10/16 134/90  02/03/16 129/81    Lab Results  Component Value Date   NA 140 02/02/2016   K 4.1 02/02/2016   CREATININE 0.79 02/02/2016    Assessment: Blood pressure control:  Controlled, Lisinopril stopped.  Other plans: Continue to monitor. Not on any meds.  - Diet, watch salt

## 2016-02-10 NOTE — Progress Notes (Signed)
Patient ID: Drew Prince, male   DOB: 1976/02/05, 40 y.o.   MRN: [id number]   Subjective:   Patient ID: Drew Prince male   DOB: May 28, 1976 39 y.o.   MRN: [id number]  HPI: [name] is a 40 y.o. with PMH- HTN, Dyslipedemia and pancreatitis. Presented today for hospital follow up fior his Pancreatitis requiring hosiptal Admission- 1/8 to 02/03/2016. On admission, Lipase- 3000, pt developed panceatic necrosis on admission, seen on Ct scan, he was then managed with antibiotics- primaxin. Etiology was attributed to meds- Lisinopril, lipitor and fibrates. He has been having persistent stable pain- 4-5/10 responding to PRN oxycodone, but says yesterday he had severe pain worse- 7-8/10  than previous that later got better, not as responsive to his pain meds.  He has had some mild intermittent nausea, but no vomiting, has a  Good appetite, and has been able to keep food down. No diarrhea, last BM- today, normal consistency, he has been taking his stool softners. He will like to establish care here. He does not smoke cigs, uses alcohol occasionally, no use of illicit drugs. No prior surgeries. Family hx of HTn- Father, he is not sure of the rest.   Past Medical History  Diagnosis Date  . Hypertension   . Hypercholesteremia    Current Outpatient Prescriptions  Medication Sig Dispense Refill  . buPROPion (WELLBUTRIN XL) 150 MG 24 hr tablet Take 150 mg by mouth daily.    . ondansetron (ZOFRAN-ODT) 4 MG disintegrating tablet Take 1-2 tablets (4-8 mg total) by mouth every 8 (eight) hours as needed for nausea or vomiting. 20 tablet 0  . oxyCODONE (OXY IR/ROXICODONE) 5 MG immediate release tablet Take 1-2 tablets (5-10 mg total) by mouth every 4 (four) hours as needed for severe pain. 30 tablet 0  . pantoprazole (PROTONIX) 40 MG tablet Take 1 tablet (40 mg total) by mouth daily. 30 tablet 0   No current facility-administered medications for this visit.   Family History  Problem Relation Age of  Onset  . Hypertension Father    Social History   Social History  . Marital Status: Married    Spouse Name: N/A  . Number of Children: N/A  . Years of Education: N/A   Social History Main Topics  . Smoking status: Never Smoker   . Smokeless tobacco: None  . Alcohol Use: Yes     Comment: rarely  . Drug Use: No  . Sexual Activity: Not Asked   Other Topics Concern  . None   Social History Narrative   Review of Systems: CONSTITUTIONAL- No Fever, weightloss SKIN- No Rash, colour changes or itching. HEAD- No Headache or dizziness. RESPIRATORY- No Cough or SOB. CARDIAC- No Palpitations, DOE, PND or chest pain. GI- No nausea, vomiting, diarrhoea, constipation, abd pain. URINARY- No Frequency, urgency, straining or dysuria. NEUROLOGIC- No Numbness, syncope, seizures or burning. [facility]- Denies depression or anxiety.  Objective:  Physical Exam: Filed Vitals:   02/10/16 1109  BP: 134/90  Pulse: 103  Temp: 98.5 F (36.9 C)  TempSrc: Oral  Height:  (1.626 m)  Weight: 166 lb 4.8 oz (75.433 kg)  SpO2: 97%   GENERAL- alert, co-operative, appears as stated age, not in any distress. HEENT- Atraumatic, normocephalic, PERRL, EOMI, oral mucosa appears moist CARDIAC- RRR, no murmurs, rubs or gallops. RESP- Moving equal volumes of air, and clear to auscultation bilaterally, no wheezes or crackles. ABDOMEN- Soft, mild tenderness- epigastric and left flank region, no guarding or rebound, no palpable masses or organomegaly, bowel  sounds present. NEURO- No obvious Cr N abnormality, strenght upper and lower extremities intact, Gait- Normal. EXTREMITIES- pulse 2+, symmetric, no pedal edema. SKIN- Warm, dry, No rash or lesion. PSYCH- Normal mood and affect, appropriate thought content and speech.  Assessment & Plan:   The patient's case and plan of care was discussed with attending physician, Dr. Criselda Peaches.  Please see problem based charting for assessment and plan.

## 2016-02-13 ENCOUNTER — Other Ambulatory Visit: Payer: Self-pay | Admitting: General Surgery

## 2016-02-13 ENCOUNTER — Ambulatory Visit: Payer: Self-pay | Admitting: General Surgery

## 2016-02-13 DIAGNOSIS — K8020 Calculus of gallbladder without cholecystitis without obstruction: Secondary | ICD-10-CM

## 2016-02-13 NOTE — H&P (Signed)
History of Present Illness Drew Filler MD; 02/13/2016 9:09 AM) Patient words: reck.  The patient is a 40 year old male who presents for evaluation of gall stones. The patient is a 40 year old male who is previously seen in the hospital secondary to gallstone pancreatitis. Patient had a fairly turbulent course in the hospital. Patient has a large pseudocyst on CT scan as well as distal pancreatic ischemia. Patient ultimately was discharged has been doing well. He's had some on and off pain at subsides on its own.  Patient's CT scan also reveals no gallstones however a previous ultrasound of 01/06/2016 reveals gallstones in the gallbladder.     Other Problems Gilmer Mor, CMA; 02/13/2016 8:44 AM) Cholelithiasis Depression Gastroesophageal Reflux Disease High blood pressure Hypercholesterolemia Pancreatitis  Past Surgical History Gilmer Mor, CMA; 02/13/2016 8:44 AM) No pertinent past surgical history  Diagnostic Studies History Gilmer Mor, CMA; 02/13/2016 8:44 AM) Colonoscopy never  Allergies (Sonya Bynum, CMA; 02/13/2016 8:46 AM) PenicillAMINE *Assorted Classes**  Medication History (Sonya Bynum, CMA; 02/13/2016 8:46 AM) BuPROPion HCl ER (XL) (  Tablet ER 24HR, Oral) Active. Pantoprazole Sodium (  Tablet DR, Oral) Active. Ondansetron (  Tablet Disperse, Oral) Active. OxyCODONE HCl (  Tablet, Oral) Active. Medications Reconciled  Social History Gilmer Mor, CMA; 02/13/2016 8:44 AM) Alcohol use Occasional alcohol use. Caffeine use Carbonated beverages, Tea. No drug use Tobacco use Never smoker.  Family History Gilmer Mor, CMA; 02/13/2016 8:44 AM) First Degree Relatives No pertinent family history    Review of Systems Lamar Laundry Bynum CMA; 02/13/2016 8:44 AM) General Present- Fatigue. Not Present- Appetite Loss, Chills, Fever, Night Sweats, Weight Gain and Weight Loss. Skin Not Present- Change in Wart/Mole, Dryness, Hives, Jaundice, New  Lesions, Non-Healing Wounds, Rash and Ulcer. HEENT Present- Seasonal Allergies and Wears glasses/contact lenses. Not Present- Earache, Hearing Loss, Hoarseness, Nose Bleed, Oral Ulcers, Ringing in the Ears, Sinus Pain, Sore Throat, Visual Disturbances and Yellow Eyes. Respiratory Present- Snoring. Not Present- Bloody sputum, Chronic Cough, Difficulty Breathing and Wheezing. Breast Not Present- Breast Mass, Breast Pain, Nipple Discharge and Skin Changes. Cardiovascular Not Present- Chest Pain, Difficulty Breathing Lying Down, Leg Cramps, Palpitations, Rapid Heart Rate, Shortness of Breath and Swelling of Extremities. Gastrointestinal Present- Abdominal Pain, Change in Bowel Habits, Constipation and Nausea. Not Present- Bloating, Bloody Stool, Chronic diarrhea, Difficulty Swallowing, Excessive gas, Gets full quickly at meals, Hemorrhoids, Indigestion, Rectal Pain and Vomiting. Male Genitourinary Present- Change in Urinary Stream. Not Present- Blood in Urine, Frequency, Impotence, Nocturia, Painful Urination, Urgency and Urine Leakage. Musculoskeletal Present- Back Pain. Not Present- Joint Pain, Joint Stiffness, Muscle Pain, Muscle Weakness and Swelling of Extremities. Neurological Not Present- Decreased Memory, Fainting, Headaches, Numbness, Seizures, Tingling, Tremor, Trouble walking and Weakness. Psychiatric Present- Anxiety and Depression. Not Present- Bipolar, Change in Sleep Pattern, Fearful and Frequent crying. Endocrine Present- Cold Intolerance. Not Present- Excessive Hunger, Hair Changes, Heat Intolerance, Hot flashes and New Diabetes. Hematology Not Present- Easy Bruising, Excessive bleeding, Gland problems, HIV and Persistent Infections.  Vitals (Sonya Bynum CMA; 02/13/2016 8:45 AM) 02/13/2016 8:45 AM Weight: 168 lb Height: 64in Body Surface Area: 1.82 m Body Mass Index: 28.84 kg/m  Temp.: 6F(Temporal)  Pulse: 76 (Regular)  BP: 124/82 (Sitting, Left Arm,  Standard)       Physical Exam Drew Filler, MD; 02/13/2016 9:11 AM) General Mental Status-Alert. General Appearance-Consistent with stated age. Hydration-Well hydrated. Voice-Normal.  Head and Neck Head-normocephalic, atraumatic with no lesions or palpable masses.  Eye Eyeball - Bilateral-Extraocular movements intact. Sclera/Conjunctiva - Bilateral-No scleral icterus.  Chest and Lung  Exam Inspection Chest Wall - Normal. Back - normal.  Cardiovascular Cardiovascular examination reveals -on palpation PMI is normal in location and amplitude, no palpable S3 or S4. Normal cardiac borders., normal heart sounds, regular rate and rhythm with no murmurs, carotid auscultation reveals no bruits and normal pedal pulses bilaterally.  Abdomen Inspection   Neurologic Neurologic evaluation reveals -alert and oriented x 3 with no impairment of recent or remote memory. Mental Status-Normal.  Musculoskeletal Normal Exam - Left-Upper Extremity Strength Normal and Lower Extremity Strength Normal. Normal Exam - Right-Upper Extremity Strength Normal, Lower Extremity Weakness.    Assessment & Plan Drew Filler MD; 02/13/2016 9:10 AM) SYMPTOMATIC CHOLELITHIASIS (K80.20) Impression: 40 year old male with gallstone pancreatitis. Patient did have the area of see a pancreatic pseudocyst on CT scan in the hospital.  1. We will proceed with laparoscopic cholecystectomy with IOC. 3.All risks and benefits were discussed with the patient to generally include: infection, bleeding, possible need for post op ERCP, damage to the bile ducts, and bile leak. Alternatives were offered and described.  All questions were answered and the patient voiced understanding of the procedure and wishes to proceed at this point with a laparoscopic cholecystectomy

## 2016-02-15 NOTE — Progress Notes (Signed)
Internal Medicine Clinic Attending  Case discussed with Dr. Emokpae at the time of the visit.  We reviewed the resident's history and exam and pertinent patient test results.  I agree with the assessment, diagnosis, and plan of care documented in the resident's note.  

## 2016-02-15 NOTE — Addendum Note (Signed)
Addended by: Debe Coder B on: 02/15/2016 03:44 PM   Modules accepted: Level of Service

## 2016-02-21 ENCOUNTER — Encounter (HOSPITAL_COMMUNITY)
Admission: RE | Admit: 2016-02-21 | Discharge: 2016-02-21 | Disposition: A | Discharge status: Home / Self Care | Payer: BLUE CROSS/BLUE SHIELD | Source: Ambulatory Visit | Attending: General Surgery | Admitting: General Surgery

## 2016-02-21 ENCOUNTER — Encounter (HOSPITAL_COMMUNITY): Payer: Self-pay

## 2016-02-21 DIAGNOSIS — Z01812 Encounter for preprocedural laboratory examination: Secondary | ICD-10-CM | POA: Insufficient documentation

## 2016-02-21 DIAGNOSIS — K8020 Calculus of gallbladder without cholecystitis without obstruction: Secondary | ICD-10-CM | POA: Insufficient documentation

## 2016-02-21 HISTORY — DX: Depression, unspecified: F32.A

## 2016-02-21 HISTORY — DX: Allergy status to unspecified drugs, medicaments and biological substances: Z88.9

## 2016-02-21 HISTORY — DX: Major depressive disorder, single episode, unspecified: F32.9

## 2016-02-21 HISTORY — DX: Acute pancreatitis without necrosis or infection, unspecified: K85.90

## 2016-02-21 HISTORY — DX: Gastro-esophageal reflux disease without esophagitis: K21.9

## 2016-02-21 LAB — CBC
HEMATOCRIT: 44.5 % (ref 39.0–52.0)
HEMOGLOBIN: 14.6 g/dL (ref 13.0–17.0)
MCH: 26.8 pg (ref 26.0–34.0)
MCHC: 32.8 g/dL (ref 30.0–36.0)
MCV: 81.8 fL (ref 78.0–100.0)
Platelets: 299 10*3/uL (ref 150–400)
RBC: 5.44 MIL/uL (ref 4.22–5.81)
RDW: 14.5 % (ref 11.5–15.5)
WBC: 6.8 10*3/uL (ref 4.0–10.5)

## 2016-02-21 LAB — BASIC METABOLIC PANEL
ANION GAP: 8 (ref 5–15)
BUN: 9 mg/dL (ref 6–20)
CALCIUM: 9.8 mg/dL (ref 8.9–10.3)
CHLORIDE: 105 mmol/L (ref 101–111)
CO2: 28 mmol/L (ref 22–32)
CREATININE: 0.77 mg/dL (ref 0.61–1.24)
GFR calc non Af Amer: >60 mL/min (ref >60)
Glucose, Bld: 97 mg/dL (ref 65–99)
Potassium: 4.5 mmol/L (ref 3.5–5.1)
SODIUM: 141 mmol/L (ref 135–145)

## 2016-02-21 NOTE — Pre-Procedure Instructions (Signed)
02-21-16 EKG 01-05-16 ,CBC 01-29-16, CMP 02-02-16, CXR 01-10-16 Epic.

## 2016-02-21 NOTE — Patient Instructions (Signed)
Ziah Leandro  02/21/2016   Your procedure is scheduled on: 03-04-16   Report to [facility] Main  Entrance take [facility]  elevators to 3rd floor to  Short Stay Center at  1130 AM.  Call this number if you have problems the morning of surgery [phone]   Remember: ONLY 1 PERSON MAY GO WITH YOU TO SHORT STAY TO GET  READY MORNING OF YOUR SURGERY.  Do not eat food or drink liquids :After Midnight.     Take these medicines the morning of surgery with A SIP OF WATER: Bupropion.Certirizine.Oxycodne. Pantoprazole. DO NOT TAKE ANY DIABETIC MEDICATIONS DAY OF YOUR SURGERY                               You may not have any metal on your body including hair pins and              piercings  Do not wear jewelry, make-up, lotions, powders or perfumes, deodorant             Do not wear nail polish.  Do not shave  48 hours prior to surgery.              Men may shave face and neck.   Do not bring valuables to the hospital. [facility] IS NOT             RESPONSIBLE   FOR VALUABLES.  Contacts, dentures or bridgework may not be worn into surgery.  Leave suitcase in the car. After surgery it may be brought to your room.     Patients discharged the day of surgery will not be allowed to drive home.  Name and phone number of your driver: TBA- will provide AM of  Special Instructions: N/A              Please read over the following fact sheets you were given: _____________________________________________________________________             [facility] - Preparing for Surgery Before surgery, you can play an important role.  Because skin is not sterile, your skin needs to be as free of germs as possible.  You can reduce the number of germs on your skin by washing with CHG (chlorahexidine gluconate) soap before surgery.  CHG is an antiseptic cleaner which kills germs and bonds with the skin to continue killing germs even after washing. Please DO NOT use if you have an allergy to  CHG or antibacterial soaps.  If your skin becomes reddened/irritated stop using the CHG and inform your nurse when you arrive at Short Stay. Do not shave (including legs and underarms) for at least 48 hours prior to the first CHG shower.  You may shave your face/neck. Please follow these instructions carefully:  1.  Shower with CHG Soap the night before surgery and the  morning of Surgery.  2.  If you choose to wash your hair, wash your hair first as usual with your  normal  shampoo.  3.  After you shampoo, rinse your hair and body thoroughly to remove the  shampoo.                           4.  Use CHG as you would any other liquid soap.  You can apply chg directly  to the skin and wash                       Gently with a scrungie or clean washcloth.  5.  Apply the CHG Soap to your body ONLY FROM THE NECK DOWN.   Do not use on face/ open                           Wound or open sores. Avoid contact with eyes, ears mouth and genitals (private parts).                       Wash face,  Genitals (private parts) with your normal soap.             6.  Wash thoroughly, paying special attention to the area where your surgery  will be performed.  7.  Thoroughly rinse your body with warm water from the neck down.  8.  DO NOT shower/wash with your normal soap after using and rinsing off  the CHG Soap.                9.  Pat yourself dry with a clean towel.            10.  Wear clean pajamas.            11.  Place clean sheets on your bed the night of your first shower and do not  sleep with pets. Day of Surgery : Do not apply any lotions/deodorants the morning of surgery.  Please wear clean clothes to the hospital/surgery center.  FAILURE TO FOLLOW THESE INSTRUCTIONS MAY RESULT IN THE CANCELLATION OF YOUR SURGERY PATIENT SIGNATURE_________________________________  NURSE SIGNATURE__________________________________  ________________________________________________________________________

## 2016-02-24 ENCOUNTER — Encounter: Payer: Self-pay | Admitting: Internal Medicine

## 2016-02-24 ENCOUNTER — Ambulatory Visit: Payer: BLUE CROSS/BLUE SHIELD | Admitting: Internal Medicine

## 2016-02-24 ENCOUNTER — Ambulatory Visit (INDEPENDENT_AMBULATORY_CARE_PROVIDER_SITE_OTHER): Payer: BLUE CROSS/BLUE SHIELD | Admitting: Internal Medicine

## 2016-02-24 VITALS — BP 129/84 | HR 102 | Temp 98.3°F | Ht 64.0 in | Wt 168.6 lb

## 2016-02-24 DIAGNOSIS — I10 Essential (primary) hypertension: Secondary | ICD-10-CM | POA: Diagnosis not present

## 2016-02-24 DIAGNOSIS — K8581 Other acute pancreatitis with uninfected necrosis: Secondary | ICD-10-CM | POA: Diagnosis not present

## 2016-02-24 DIAGNOSIS — K8500 Idiopathic acute pancreatitis without necrosis or infection: Secondary | ICD-10-CM

## 2016-02-24 MED ORDER — OXYCODONE HCL 5 MG PO TABS: 5.0000 mg | ORAL_TABLET | Freq: Three times a day (TID) | ORAL | Status: DC | PRN

## 2016-02-24 NOTE — Patient Instructions (Signed)
1. Please make a follow up appointment in 2 weeks after your surgery.   2. Please take all medications as previously prescribed.  3. If you have worsening of your symptoms or new symptoms arise, please call the clinic ([phone]), or go to the ER immediately if symptoms are severe.  You have done a great job in taking all your medications. Please continue to do this.    Laparoscopic Cholecystectomy Laparoscopic cholecystectomy is surgery to remove the gallbladder. The gallbladder is located in the upper right part of the abdomen, behind the liver. It is a storage sac for bile, which is produced in the liver. Bile aids in the digestion and absorption of fats. Cholecystectomy is often done for inflammation of the gallbladder (cholecystitis). This condition is usually caused by a buildup of gallstones (cholelithiasis) in the gallbladder. Gallstones can block the flow of bile, and that can result in inflammation and pain. In severe cases, emergency surgery may be required. If emergency surgery is not required, you will have time to prepare for the procedure. Laparoscopic surgery is an alternative to open surgery. Laparoscopic surgery has a shorter recovery time. Your common bile duct may also need to be examined during the procedure. If stones are found in the common bile duct, they may be removed. LET [facility] CARE PROVIDER KNOW ABOUT:  Any allergies you have.  All medicines you are taking, including vitamins, herbs, eye drops, creams, and over-the-counter medicines.  Previous problems you or members of your family have had with the use of anesthetics.  Any blood disorders you have.  Previous surgeries you have had.  Any medical conditions you have. RISKS AND COMPLICATIONS Generally, this is a safe procedure. However, problems may occur, including:  Infection.  Bleeding.  Allergic reactions to medicines.  Damage to other structures or organs.  A stone remaining in the common bile  duct.  A bile leak from the cyst duct that is clipped when your gallbladder is removed.  The need to convert to open surgery, which requires a larger incision in the abdomen. This may be necessary if your surgeon thinks that it is not safe to continue with a laparoscopic procedure. BEFORE THE PROCEDURE  Ask your health care provider about:  Changing or stopping your regular medicines. This is especially important if you are taking diabetes medicines or blood thinners.  Taking medicines such as aspirin and ibuprofen. These medicines can thin your blood. Do not take these medicines before your procedure if your health care provider instructs you not to.  Follow instructions from your health care provider about eating or drinking restrictions.  Let your health care provider know if you develop a cold or an infection before surgery.  Plan to have someone take you home after the procedure.  Ask your health care provider how your surgical site will be marked or identified.  You may be given antibiotic medicine to help prevent infection. PROCEDURE  To reduce your risk of infection:  Your health care team will wash or sanitize their hands.  Your skin will be washed with soap.  An IV tube may be inserted into one of your veins.  You will be given a medicine to make you fall asleep (general anesthetic).  A breathing tube will be placed in your mouth.  The surgeon will make several small cuts (incisions) in your abdomen.  A thin, lighted tube (laparoscope) that has a tiny camera on the end will be inserted through one of the small incisions. The  camera on the laparoscope will send a picture to a TV screen (monitor) in the operating room. This will give the surgeon a good view inside your abdomen.  A gas will be pumped into your abdomen. This will expand your abdomen to give the surgeon more room to perform the surgery.  Other tools that are needed for the procedure will be inserted  through the other incisions. The gallbladder will be removed through one of the incisions.  After your gallbladder has been removed, the incisions will be closed with stitches (sutures), staples, or skin glue.  Your incisions may be covered with a bandage (dressing). The procedure may vary among health care providers and hospitals. AFTER THE PROCEDURE  Your blood pressure, heart rate, breathing rate, and blood oxygen level will be monitored often until the medicines you were given have worn off.  You will be given medicines as needed to control your pain.   This information is not intended to replace advice given to you by your health care provider. Make sure you discuss any questions you have with your health care provider.   Document Released: 12/14/2005 Document Revised: 09/04/2015 Document Reviewed: 07/26/2013 Elsevier Interactive Patient Education [employer]

## 2016-02-24 NOTE — Progress Notes (Signed)
   Subjective:   Patient ID: Drew Prince male   DOB: Oct 11, 1976 40 y.o.   MRN: [id number]  HPI: [name] is a 40 y.o. male w/ PMHx of HTN, HLD, GERD, depression, and recent admission for idiopathic vs medication induced necrotic pancreatitis 40 presents to the clinic today for a follow-up visit regarding his abdominal discomfort. Says he is doing much better but still having pain mostly in the bilateral lower quadrants more recently. He describes the pain as dull in nature, mild to the touch. He states he is able to eat well, smaller meals, but says he eats three meals daily. No nausea, vomiting, or diarrhea. He still takes 1-2 Oxy-IR daily for pain and he is trying to wean this back slowly. Recently seen in surgery office, patient is scheduled for repeat abdominal US on 02/28/16 and lap chole on 03/04/16.    Past Medical History  Diagnosis Date  . Hypercholesteremia   . Hypertension     01-05-16 to 02-03-16 Hospitalized for pancreatitis, B/P med discontinued during that time.  . H/O seasonal allergies     OTC med used  . Depression   . GERD (gastroesophageal reflux disease)   . Acute pancreatitis     1-8-17to 02-03-16 Hospital stay at [facility].   Current Outpatient Prescriptions  Medication Sig Dispense Refill  . buPROPion (WELLBUTRIN XL) 150 MG 24 hr tablet Take 150 mg by mouth daily.    . cetirizine (ZYRTEC) 10 MG tablet Take 10 mg by mouth daily.    . ondansetron (ZOFRAN-ODT) 4 MG disintegrating tablet Take 1-2 tablets (4-8 mg total) by mouth every 8 (eight) hours as needed for nausea or vomiting. 20 tablet 0  . oxyCODONE (OXY IR/ROXICODONE) 5 MG immediate release tablet Take 1-2 tablets (5-10 mg total) by mouth every 4 (four) hours as needed for severe pain. 30 tablet 0  . pantoprazole (PROTONIX) 40 MG tablet Take 1 tablet (40 mg total) by mouth daily. 30 tablet 0   No current facility-administered medications for this visit.    Review of Systems: General: Denies fever, chills,  diaphoresis, appetite change and fatigue.  Respiratory: Denies SOB, DOE, cough, and wheezing.   Cardiovascular: Denies chest pain and palpitations.  Gastrointestinal: Positive for mild abdominal pain. Denies nausea, vomiting, and diarrhea.  Genitourinary: Denies dysuria, increased frequency, and flank pain. Endocrine: Denies hot or cold intolerance, polyuria, and polydipsia. Musculoskeletal: Denies myalgias, back pain, joint swelling, arthralgias and gait problem.  Skin: Denies pallor, rash and wounds.  Neurological: Denies dizziness, seizures, syncope, weakness, lightheadedness, numbness and headaches.  Psychiatric/Behavioral: Denies mood changes, and sleep disturbances.  Objective:   Physical Exam: Filed Vitals:   02/24/16 1102  BP: 129/84  Pulse: 102  Temp: 98.3 F (36.8 C)  TempSrc: Oral  Height:  (1.626 m)  Weight: 168 lb 9.6 oz (76.476 kg)  SpO2: 98%    General: Alert, cooperative, NAD. HEENT: PERRL, EOMI. Moist mucus membranes Neck: Full range of motion without pain, supple, no lymphadenopathy or carotid bruits Lungs: Clear to ascultation bilaterally, normal work of respiration, no wheezes, rales, rhonchi Heart: RRR, no murmurs, gallops, or rubs Abdomen: Soft, mild tenderness diffusely, non-distended, BS +. No rebound or guarding. No masses.  Extremities: No cyanosis, clubbing, or edema Neurologic: Alert & oriented X3, cranial nerves II-XII intact, strength grossly intact, sensation intact to light touch   Assessment & Plan:   Please see problem based assessment and plan.

## 2016-02-25 NOTE — Assessment & Plan Note (Addendum)
Patient with continued mild abdominal discomfort, still taking 1-2 Oxy-IR per day, is tapering himself off of this. Eating well, improved appetite, no nausea, vomiting, or diarrhea. On exam, mild diffuse tenderness to palpation, no rebound or guarding. No masses palpated. No epigastric discomfort. Seen by surgery on 02/21/16, scheduled for follow up abdominal US on 02/28/16 and lap chole for non-obstructing gall stones.  -Refilled Ox-IR 5-10 mg q8h prn for abdominal pain #30. Patient feels this will last him at least 2 weeks until after his surgery.  -RTC after procedure.

## 2016-02-25 NOTE — Progress Notes (Signed)
Internal Medicine Clinic Attending  Case discussed with Dr. Jones at the time of the visit.  We reviewed the resident's history and exam and pertinent patient test results.  I agree with the assessment, diagnosis, and plan of care documented in the resident's note.  

## 2016-02-25 NOTE — Assessment & Plan Note (Addendum)
BP Readings from Last 3 Encounters:  02/24/16 129/84  02/21/16 125/88  02/10/16 134/90    Lab Results  Component Value Date   NA 141 02/21/2016   K 4.5 02/21/2016   CREATININE 0.77 02/21/2016    Assessment: Blood pressure control:  Well controlled Comments: Not currently on any medications as ACEI was discontinued during his recent hospitalization.   Plan: Medications:  Continue to monitor off medications for now Other plans: RTC in 2 weeks after lap chole

## 2016-02-28 ENCOUNTER — Ambulatory Visit
Admission: RE | Admit: 2016-02-28 | Discharge: 2016-02-28 | Disposition: A | Discharge status: Home / Self Care | Payer: BLUE CROSS/BLUE SHIELD | Source: Ambulatory Visit | Attending: General Surgery | Admitting: General Surgery

## 2016-02-28 DIAGNOSIS — K8020 Calculus of gallbladder without cholecystitis without obstruction: Secondary | ICD-10-CM

## 2016-02-28 NOTE — Progress Notes (Signed)
02-28-16 1020 Pt. Left voice mail of surgery time change to 1233 PM on 03-04-16, informed to arrive by 1030 to Short Stay- all other previous instructions unchanged.Pt ask to return call to confirm message.

## 2016-03-04 ENCOUNTER — Ambulatory Visit (HOSPITAL_COMMUNITY)
Admission: RE | Admit: 2016-03-04 | Discharge: 2016-03-04 | Disposition: A | Discharge status: Home / Self Care | Payer: BLUE CROSS/BLUE SHIELD | Source: Ambulatory Visit | Attending: General Surgery | Admitting: General Surgery

## 2016-03-04 ENCOUNTER — Encounter (HOSPITAL_COMMUNITY): Payer: Self-pay | Admitting: Anesthesiology

## 2016-03-04 ENCOUNTER — Ambulatory Visit (HOSPITAL_COMMUNITY): Payer: BLUE CROSS/BLUE SHIELD | Admitting: Anesthesiology

## 2016-03-04 ENCOUNTER — Encounter (HOSPITAL_COMMUNITY)
Admission: RE | Disposition: A | Discharge status: Home / Self Care | Payer: Self-pay | Source: Ambulatory Visit | Attending: General Surgery

## 2016-03-04 DIAGNOSIS — I10 Essential (primary) hypertension: Secondary | ICD-10-CM | POA: Diagnosis not present

## 2016-03-04 DIAGNOSIS — Z79891 Long term (current) use of opiate analgesic: Secondary | ICD-10-CM | POA: Insufficient documentation

## 2016-03-04 DIAGNOSIS — F329 Major depressive disorder, single episode, unspecified: Secondary | ICD-10-CM | POA: Insufficient documentation

## 2016-03-04 DIAGNOSIS — K863 Pseudocyst of pancreas: Secondary | ICD-10-CM | POA: Diagnosis not present

## 2016-03-04 DIAGNOSIS — R112 Nausea with vomiting, unspecified: Secondary | ICD-10-CM

## 2016-03-04 DIAGNOSIS — Z9889 Other specified postprocedural states: Secondary | ICD-10-CM

## 2016-03-04 DIAGNOSIS — K219 Gastro-esophageal reflux disease without esophagitis: Secondary | ICD-10-CM | POA: Diagnosis not present

## 2016-03-04 DIAGNOSIS — K8010 Calculus of gallbladder with chronic cholecystitis without obstruction: Secondary | ICD-10-CM | POA: Diagnosis not present

## 2016-03-04 DIAGNOSIS — Z79899 Other long term (current) drug therapy: Secondary | ICD-10-CM | POA: Diagnosis not present

## 2016-03-04 DIAGNOSIS — K8020 Calculus of gallbladder without cholecystitis without obstruction: Secondary | ICD-10-CM | POA: Diagnosis present

## 2016-03-04 HISTORY — DX: Other specified postprocedural states: Z98.890

## 2016-03-04 HISTORY — DX: Nausea with vomiting, unspecified: R11.2

## 2016-03-04 HISTORY — PX: CHOLECYSTECTOMY: SHX55

## 2016-03-04 SURGERY — LAPAROSCOPIC CHOLECYSTECTOMY WITH INTRAOPERATIVE CHOLANGIOGRAM
Anesthesia: General

## 2016-03-04 MED ORDER — OXYCODONE HCL 5 MG PO TABS
5.0000 mg | ORAL_TABLET | ORAL | Status: DC | PRN
  Administered 2016-03-04: 5 mg via ORAL
  Filled 2016-03-04: qty 1

## 2016-03-04 MED ORDER — BUPIVACAINE-EPINEPHRINE (PF) 0.25% -1:200000 IJ SOLN
INTRAMUSCULAR | Status: DC | PRN
  Administered 2016-03-04: 6 mL

## 2016-03-04 MED ORDER — LABETALOL HCL 5 MG/ML IV SOLN
INTRAVENOUS | Status: DC | PRN
  Administered 2016-03-04: 2.5 mg via INTRAVENOUS

## 2016-03-04 MED ORDER — KETOROLAC TROMETHAMINE 30 MG/ML IJ SOLN
30.0000 mg | Freq: Once | INTRAMUSCULAR | Status: AC
  Administered 2016-03-04: 30 mg via INTRAVENOUS

## 2016-03-04 MED ORDER — MIDAZOLAM HCL 2 MG/2ML IJ SOLN
INTRAMUSCULAR | Status: AC
  Filled 2016-03-04: qty 2

## 2016-03-04 MED ORDER — SUGAMMADEX SODIUM 200 MG/2ML IV SOLN
INTRAVENOUS | Status: DC | PRN
  Administered 2016-03-04: 200 mg via INTRAVENOUS

## 2016-03-04 MED ORDER — FENTANYL CITRATE (PF) 100 MCG/2ML IJ SOLN
25.0000 ug | INTRAMUSCULAR | Status: DC | PRN
  Administered 2016-03-04 (×3): 50 ug via INTRAVENOUS

## 2016-03-04 MED ORDER — LABETALOL HCL 5 MG/ML IV SOLN
INTRAVENOUS | Status: AC
  Filled 2016-03-04: qty 4

## 2016-03-04 MED ORDER — HYDROMORPHONE HCL 2 MG/ML IJ SOLN
INTRAMUSCULAR | Status: AC
  Filled 2016-03-04: qty 1

## 2016-03-04 MED ORDER — ESMOLOL HCL 100 MG/10ML IV SOLN
INTRAVENOUS | Status: AC
  Filled 2016-03-04: qty 20

## 2016-03-04 MED ORDER — ACETAMINOPHEN 325 MG PO TABS: 650.0000 mg | ORAL_TABLET | ORAL | Status: DC | PRN

## 2016-03-04 MED ORDER — METOCLOPRAMIDE HCL 5 MG/ML IJ SOLN
INTRAMUSCULAR | Status: DC | PRN
  Administered 2016-03-04: 10 mg via INTRAVENOUS

## 2016-03-04 MED ORDER — DEXAMETHASONE SODIUM PHOSPHATE 10 MG/ML IJ SOLN
INTRAMUSCULAR | Status: DC | PRN
Start: 2016-03-04 — End: 2016-03-04
  Administered 2016-03-04: 10 mg via INTRAVENOUS

## 2016-03-04 MED ORDER — CEFAZOLIN SODIUM-DEXTROSE 2-3 GM-% IV SOLR
INTRAVENOUS | Status: AC
  Filled 2016-03-04: qty 50

## 2016-03-04 MED ORDER — SODIUM CHLORIDE 0.9% FLUSH: 3.0000 mL | INTRAVENOUS | Status: DC | PRN

## 2016-03-04 MED ORDER — ACETAMINOPHEN 10 MG/ML IV SOLN
INTRAVENOUS | Status: AC
  Filled 2016-03-04: qty 100

## 2016-03-04 MED ORDER — FENTANYL CITRATE (PF) 100 MCG/2ML IJ SOLN
INTRAMUSCULAR | Status: DC | PRN
Start: 2016-03-04 — End: 2016-03-04
  Administered 2016-03-04 (×5): 50 ug via INTRAVENOUS

## 2016-03-04 MED ORDER — CEFAZOLIN SODIUM-DEXTROSE 2-3 GM-% IV SOLR
2.0000 g | INTRAVENOUS | Status: AC
  Administered 2016-03-04: 2 g via INTRAVENOUS

## 2016-03-04 MED ORDER — SODIUM CHLORIDE 0.9% FLUSH: 3.0000 mL | Freq: Two times a day (BID) | INTRAVENOUS | Status: DC

## 2016-03-04 MED ORDER — LIDOCAINE HCL (CARDIAC) 20 MG/ML IV SOLN
INTRAVENOUS | Status: DC | PRN
  Administered 2016-03-04: 100 mg via INTRAVENOUS

## 2016-03-04 MED ORDER — LACTATED RINGERS IV SOLN
INTRAVENOUS | Status: DC | PRN
  Administered 2016-03-04 (×2): via INTRAVENOUS

## 2016-03-04 MED ORDER — FENTANYL CITRATE (PF) 100 MCG/2ML IJ SOLN
INTRAMUSCULAR | Status: AC
  Filled 2016-03-04: qty 2

## 2016-03-04 MED ORDER — OXYCODONE-ACETAMINOPHEN 5-325 MG PO TABS: 1.0000 | ORAL_TABLET | ORAL | Status: DC | PRN

## 2016-03-04 MED ORDER — LABETALOL HCL 5 MG/ML IV SOLN
5.0000 mg | Freq: Once | INTRAVENOUS | Status: AC
  Administered 2016-03-04: 5 mg via INTRAVENOUS

## 2016-03-04 MED ORDER — ACETAMINOPHEN 10 MG/ML IV SOLN
1000.0000 mg | Freq: Once | INTRAVENOUS | Status: AC
  Administered 2016-03-04: 1000 mg via INTRAVENOUS

## 2016-03-04 MED ORDER — ONDANSETRON HCL 4 MG/2ML IJ SOLN
INTRAMUSCULAR | Status: DC | PRN
  Administered 2016-03-04: 4 mg via INTRAVENOUS

## 2016-03-04 MED ORDER — MORPHINE SULFATE (PF) 10 MG/ML IV SOLN: 2.0000 mg | INTRAVENOUS | Status: DC | PRN

## 2016-03-04 MED ORDER — ROCURONIUM BROMIDE 100 MG/10ML IV SOLN
INTRAVENOUS | Status: DC | PRN
  Administered 2016-03-04: 5 mg via INTRAVENOUS
  Administered 2016-03-04: 30 mg via INTRAVENOUS

## 2016-03-04 MED ORDER — PROMETHAZINE HCL 25 MG/ML IJ SOLN: 6.2500 mg | INTRAMUSCULAR | Status: DC | PRN

## 2016-03-04 MED ORDER — FENTANYL CITRATE (PF) 250 MCG/5ML IJ SOLN
INTRAMUSCULAR | Status: AC
  Filled 2016-03-04: qty 5

## 2016-03-04 MED ORDER — SUGAMMADEX SODIUM 200 MG/2ML IV SOLN
INTRAVENOUS | Status: AC
  Filled 2016-03-04: qty 4

## 2016-03-04 MED ORDER — HYDROMORPHONE HCL 1 MG/ML IJ SOLN
INTRAMUSCULAR | Status: DC | PRN
  Administered 2016-03-04 (×3): 0.5 mg via INTRAVENOUS

## 2016-03-04 MED ORDER — ACETAMINOPHEN 650 MG RE SUPP
650.0000 mg | RECTAL | Status: DC | PRN
  Filled 2016-03-04: qty 1

## 2016-03-04 MED ORDER — CHLORHEXIDINE GLUCONATE 4 % EX LIQD: 1.0000 "application " | Freq: Once | CUTANEOUS | Status: DC

## 2016-03-04 MED ORDER — KETOROLAC TROMETHAMINE 30 MG/ML IJ SOLN
INTRAMUSCULAR | Status: AC
  Filled 2016-03-04: qty 1

## 2016-03-04 MED ORDER — PROPOFOL 10 MG/ML IV BOLUS
INTRAVENOUS | Status: DC | PRN
  Administered 2016-03-04: 50 mg via INTRAVENOUS
  Administered 2016-03-04: 150 mg via INTRAVENOUS

## 2016-03-04 MED ORDER — MIDAZOLAM HCL 5 MG/5ML IJ SOLN
INTRAMUSCULAR | Status: DC | PRN
  Administered 2016-03-04: 2 mg via INTRAVENOUS

## 2016-03-04 MED ORDER — SODIUM CHLORIDE 0.9 % IV SOLN: 250.0000 mL | INTRAVENOUS | Status: DC | PRN

## 2016-03-04 MED ORDER — PROPOFOL 10 MG/ML IV BOLUS
INTRAVENOUS | Status: AC
  Filled 2016-03-04: qty 20

## 2016-03-04 MED ORDER — BUPIVACAINE-EPINEPHRINE (PF) 0.25% -1:200000 IJ SOLN
INTRAMUSCULAR | Status: AC
  Filled 2016-03-04: qty 30

## 2016-03-04 SURGICAL SUPPLY — 38 items
APPLIER CLIP 5 13 M/L LIGAMAX5 (MISCELLANEOUS)
BENZOIN TINCTURE PRP APPL 2/3 (GAUZE/BANDAGES/DRESSINGS) ×3 IMPLANT
CABLE HIGH FREQUENCY MONO STRZ (ELECTRODE) IMPLANT
CHLORAPREP W/TINT 26ML (MISCELLANEOUS) ×3 IMPLANT
CLIP APPLIE 5 13 M/L LIGAMAX5 (MISCELLANEOUS) IMPLANT
CLIP LIGATING HEMO O LOK GREEN (MISCELLANEOUS) ×3 IMPLANT
CLOSURE WOUND 1/2 X4 (GAUZE/BANDAGES/DRESSINGS) ×1
COVER MAYO STAND STRL (DRAPES) IMPLANT
COVER SURGICAL LIGHT HANDLE (MISCELLANEOUS) ×3 IMPLANT
COVER TRANSDUCER ULTRASND (DRAPES) ×3 IMPLANT
DECANTER SPIKE VIAL GLASS SM (MISCELLANEOUS) ×3 IMPLANT
DEVICE TROCAR PUNCTURE CLOSURE (ENDOMECHANICALS) ×3 IMPLANT
DRAPE C-ARM 42X120 X-RAY (DRAPES) IMPLANT
DRAPE LAPAROSCOPIC ABDOMINAL (DRAPES) ×3 IMPLANT
ELECT REM PT RETURN 9FT ADLT (ELECTROSURGICAL) ×3
ELECTRODE REM PT RTRN 9FT ADLT (ELECTROSURGICAL) ×1 IMPLANT
GAUZE SPONGE 2X2 8PLY STRL LF (GAUZE/BANDAGES/DRESSINGS) ×1 IMPLANT
GAUZE SPONGE 4X4 12PLY STRL (GAUZE/BANDAGES/DRESSINGS) ×3 IMPLANT
GLOVE BIO SURGEON STRL SZ7.5 (GLOVE) ×3 IMPLANT
GOWN STRL REUS W/TWL XL LVL3 (GOWN DISPOSABLE) ×9 IMPLANT
KIT BASIN OR (CUSTOM PROCEDURE TRAY) ×3 IMPLANT
NEEDLE INSUFFLATION 14GA 120MM (NEEDLE) ×3 IMPLANT
NS IRRIG 1000ML POUR BTL (IV SOLUTION) ×3 IMPLANT
PAD POSITIONING PINK XL (MISCELLANEOUS) IMPLANT
POSITIONER SURGICAL ARM (MISCELLANEOUS) IMPLANT
RINGERS IRRIG 1000ML POUR BTL (IV SOLUTION) ×3 IMPLANT
SCISSORS LAP 5X35 DISP (ENDOMECHANICALS) ×3 IMPLANT
SET CHOLANGIOGRAPH MIX (MISCELLANEOUS) IMPLANT
SET IRRIG TUBING LAPAROSCOPIC (IRRIGATION / IRRIGATOR) ×3 IMPLANT
SLEEVE XCEL OPT CAN 5 100 (ENDOMECHANICALS) ×3 IMPLANT
SPONGE GAUZE 2X2 STER 10/PKG (GAUZE/BANDAGES/DRESSINGS) ×2
STRIP CLOSURE SKIN 1/2X4 (GAUZE/BANDAGES/DRESSINGS) ×2 IMPLANT
SUT MNCRL AB 4-0 PS2 18 (SUTURE) ×3 IMPLANT
TAPE CLOTH 4X10 WHT NS (GAUZE/BANDAGES/DRESSINGS) IMPLANT
TOWEL OR 17X26 10 PK STRL BLUE (TOWEL DISPOSABLE) ×3 IMPLANT
TRAY LAPAROSCOPIC (CUSTOM PROCEDURE TRAY) ×3 IMPLANT
TROCAR BLADELESS OPT 5 100 (ENDOMECHANICALS) ×3 IMPLANT
TROCAR XCEL NON-BLD 11X100MML (ENDOMECHANICALS) ×3 IMPLANT

## 2016-03-04 NOTE — Anesthesia Preprocedure Evaluation (Addendum)
Anesthesia Evaluation  Patient identified by MRN, date of birth, ID band Patient awake    Reviewed: Allergy & Precautions, NPO status , Patient's Chart, lab work & pertinent test results  Airway Mallampati: I  TM Distance: >3 FB Neck ROM: Full    Dental  (+) Teeth Intact, Dental Advisory Given   Pulmonary neg pulmonary ROS,    Pulmonary exam normal breath sounds clear to auscultation       Cardiovascular hypertension, Normal cardiovascular exam Rhythm:Regular Rate:Normal     Neuro/Psych PSYCHIATRIC DISORDERS Depression negative neurological ROS     GI/Hepatic Neg liver ROS, GERD  Medicated,Gallstone pancreatitis   Endo/Other  negative endocrine ROS  Renal/GU negative Renal ROS     Musculoskeletal negative musculoskeletal ROS (+)   Abdominal   Peds  Hematology negative hematology ROS (+)   Anesthesia Other Findings Day of surgery medications reviewed with the patient.  Reproductive/Obstetrics                            Anesthesia Physical Anesthesia Plan  ASA: II  Anesthesia Plan: General   Post-op Pain Management:    Induction: Intravenous  Airway Management Planned: Oral ETT  Additional Equipment:   Intra-op Plan:   Post-operative Plan: Extubation in OR  Informed Consent: I have reviewed the patients History and Physical, chart, labs and discussed the procedure including the risks, benefits and alternatives for the proposed anesthesia with the patient or authorized representative who has indicated his/her understanding and acceptance.   Dental advisory given  Plan Discussed with: CRNA  Anesthesia Plan Comments: (Risks/benefits of general anesthesia discussed with patient including risk of damage to teeth, lips, gum, and tongue, nausea/vomiting, allergic reactions to medications, and the possibility of heart attack, stroke and death.  All patient questions answered.  Patient  wishes to proceed.)        Anesthesia Quick Evaluation

## 2016-03-04 NOTE — Anesthesia Procedure Notes (Signed)
Procedure Name: Intubation Date/Time: 03/04/2016 11:43 AM Performed by: Sherryl Valido, Nuala AlphaKRISTOPHER Pre-anesthesia Checklist: Patient identified, Emergency Drugs available, Suction available, Patient being monitored and Timeout performed Patient Re-evaluated:Patient Re-evaluated prior to inductionOxygen Delivery Method: Circle system utilized Preoxygenation: Pre-oxygenation with 100% oxygen Intubation Type: IV induction Ventilation: Two handed mask ventilation required Laryngoscope Size: Mac and 3 Grade View: Grade I Tube type: Oral Tube size: 7.5 mm Number of attempts: 1 Airway Equipment and Method: Stylet Placement Confirmation: ETT inserted through vocal cords under direct vision,  positive ETCO2,  CO2 detector and breath sounds checked- equal and bilateral Secured at: 22 cm Tube secured with: Tape Dental Injury: Teeth and Oropharynx as per pre-operative assessment

## 2016-03-04 NOTE — Op Note (Signed)
03/04/2016  12:24 PM  PATIENT:  Drew Prince  40 y.o. male  PRE-OPERATIVE DIAGNOSIS:  Gallstone pancreatitis  POST-OPERATIVE DIAGNOSIS:  Gallstone pancreatitis  PROCEDURE:  Procedure(s): LAPAROSCOPIC CHOLECYSTECTOMY  (N/A)  SURGEON:  Surgeon(s) and Role:    * Axel FillerArmando Aikeem Lilley, MD - Primary ANESTHESIA:   local and general  EBL:   10cc  BLOOD ADMINISTERED:none  DRAINS: none   LOCAL MEDICATIONS USED:  BUPIVICAINE   SPECIMEN:  Source of Specimen:  gallbladder  DISPOSITION OF SPECIMEN:  PATHOLOGY  COUNTS:  YES  TOURNIQUET:  * No tourniquets in log *  DICTATION: .Dragon Dictation  EBL: <5cc   Complications: none   Counts: reported as correct x 2   Findings:chronically inflammed gallbladder and gallstoens  Indications for procedure: Pt is a 40 y/o M with RUQ pain and seen to have gallstones and an episode of biliary pancreatitis  Details of the procedure: The patient was taken to the operating and placed in the supine position with bilateral SCDs in place. A time out was called and all facts were verified. A pneumoperitoneum was obtained via A Veress needle technique to a pressure of 14mm of mercury. A 5mm trochar was then placed in the right upper quadrant under visualization, and there were no injuries to any abdominal organs. A 11 mm port was then placed in the umbilical region after infiltrating with local anesthesia under direct visualization. A second epigastric port was placed under direct visualization.   The gallbladder was identified and retracted, the peritoneum was then sharply dissected from the gallbladder and this dissection was carried down to Calot's triangle. The cystic duct was identified and stripped away circumferentially and seen going into the gallbladder 360, the critical angle was obtained.  2 clips were placed proximally one distally and the cystic duct transected. The cystic artery was identified and 2 clips placed proximally and one distally and  transected. We then proceeded to remove the gallbladder off the hepatic fossa with Bovie cautery. A retrieval bag was then placed in the abdomen and gallbladder placed in the bag. The hepatic fossa was then reexamined and hemostasis was achieved with Bovie cautery and was excellent at this portion of the case. The subhepatic fossa and perihepatic fossa was then irrigated until the effluent was clear. The specimen bag and specimen were removed from the abdominal cavity.  The 11 mm trocar fascia was reapproximated with the Endo Close #1 Vicryl x1. The pneumoperitoneum was evacuated and all trochars removed under direct visulalization. The skin was then closed with 4-0 Monocryl and the skin dressed with Steri-Strips, gauze, and tape. The patient was awaken from general anesthesia and taken to the recovery room in stable condition.    PLAN OF CARE: Discharge to home after PACU  PATIENT DISPOSITION:  PACU - hemodynamically stable.   Delay start of Pharmacological VTE agent (>24hrs) due to surgical blood loss or risk of bleeding: not applicable

## 2016-03-04 NOTE — Transfer of Care (Signed)
Immediate Anesthesia Transfer of Care Note  Patient: Drew Prince  Procedure(s) Performed: Procedure(s): LAPAROSCOPIC CHOLECYSTECTOMY  (N/A)  Patient Location: PACU  Anesthesia Type:General  Level of Consciousness:  sedated, patient cooperative and responds to stimulation  Airway & Oxygen Therapy:Patient Spontanous Breathing and Patient connected to face mask oxgen  Post-op Assessment:  Report given to PACU RN and Post -op Vital signs reviewed and stable  Post vital signs:  Reviewed and stable  Last Vitals:  Filed Vitals:   03/04/16 1040 03/04/16 1245  BP: 142/94   Pulse: 90   Temp: 36.5 C 36.6 C  Resp: 18     Complications: No apparent anesthesia complications

## 2016-03-04 NOTE — Discharge Instructions (Signed)
[facility] ______[facility] °LAPAROSCOPIC SURGERY: POST OP INSTRUCTIONS °Always review your discharge instruction sheet given to you by the facility where your surgery was performed. °IF YOU HAVE DISABILITY OR FAMILY LEAVE FORMS, YOU MUST BRING THEM TO THE OFFICE FOR PROCESSING.   °DO NOT GIVE THEM TO YOUR DOCTOR. ° °1. A prescription for pain medication may be given to you upon discharge.  Take your pain medication as prescribed, if needed.  If narcotic pain medicine is not needed, then you may take acetaminophen (Tylenol) or ibuprofen (Advil) as needed. °2. Take your usually prescribed medications unless otherwise directed. °3. If you need a refill on your pain medication, please contact your pharmacy.  They will contact our office to request authorization. Prescriptions will not be filled after 5pm or on week-ends. °4. You should follow a light diet the first few days after arrival home, such as soup and crackers, etc.  Be sure to include lots of fluids daily. °5. Most patients will experience some swelling and bruising in the area of the incisions.  Ice packs will help.  Swelling and bruising can take several days to resolve.  °6. It is common to experience some constipation if taking pain medication after surgery.  Increasing fluid intake and taking a stool softener (such as Colace) will usually help or prevent this problem from occurring.  A mild laxative (Milk of Magnesia or Miralax) should be taken according to package instructions if there are no bowel movements after 48 hours. °7. Unless discharge instructions indicate otherwise, you may remove your bandages 24-48 hours after surgery, and you may shower at that time.  You may have steri-strips (small skin tapes) in place directly over the incision.  These strips should be left on the skin for 7-10 days.  If your surgeon used skin glue on the incision, you may shower in 24 hours.  The glue will flake off over the next 2-3 weeks.  Any sutures or  staples will be removed at the office during your follow-up visit. °8. ACTIVITIES:  You may resume regular (light) daily activities beginning the next day--such as daily self-care, walking, climbing stairs--gradually increasing activities as tolerated.  You may have sexual intercourse when it is comfortable.  Refrain from any heavy lifting or straining until approved by your doctor. °a. You may drive when you are no longer taking prescription pain medication, you can comfortably wear a seatbelt, and you can safely maneuver your car and apply brakes. °b. RETURN TO WORK:  __________________________________________________________ °9. You should see your doctor in the office for a follow-up appointment approximately 2-3 weeks after your surgery.  Make sure that you call for this appointment within a day or two after you arrive home to insure a convenient appointment time. °10. OTHER INSTRUCTIONS: __________________________________________________________________________________________________________________________ __________________________________________________________________________________________________________________________ °WHEN TO CALL YOUR DOCTOR: °1. Fever over 101.0 °2. Inability to urinate °3. Continued bleeding from incision. °4. Increased pain, redness, or drainage from the incision. °5. Increasing abdominal pain ° °The clinic staff is available to answer your questions during regular business hours.  Please don’t hesitate to call and ask to speak to one of the nurses for clinical concerns.  If you have a medical emergency, go to the nearest emergency room or call 911.  A surgeon from [facility] is always on call at the hospital. °[address], [address], [place], [place]  [address] ? [address] [address], [place], [place]   [address] °[phone] ? [phone] ? FAX [phone] °Web site:   [email or web address] ° ° ° ° ° ° ° °General Anesthesia, Adult, Care After °Refer  to this sheet in the next few weeks. These instructions provide you with information on caring for yourself after your procedure. Your health care provider may also give you more specific instructions. Your treatment has been planned according to current medical practices, but problems sometimes occur. Call your health care provider if you have any problems or questions after your procedure. °WHAT TO EXPECT AFTER THE PROCEDURE °After the procedure, it is typical to experience: °· Sleepiness. °· Nausea and vomiting. °HOME CARE INSTRUCTIONS °· For the first 24 hours after general anesthesia: °¨ Have a responsible person with you. °¨ Do not drive a car. If you are alone, do not take public transportation. °¨ Do not drink alcohol. °¨ Do not take medicine that has not been prescribed by your health care provider. °¨ Do not sign important papers or make important decisions. °¨ You may resume a normal diet and activities as directed by your health care provider. °· Change bandages (dressings) as directed. °· If you have questions or problems that seem related to general anesthesia, call the hospital and ask for the anesthetist or anesthesiologist on call. °SEEK MEDICAL CARE IF: °· You have nausea and vomiting that continue the day after anesthesia. °· You develop a rash. °SEEK IMMEDIATE MEDICAL CARE IF:  °· You have difficulty breathing. °· You have chest pain. °· You have any allergic problems. °  °This information is not intended to replace advice given to you by your health care provider. Make sure you discuss any questions you have with your health care provider. °  °Document Released: 03/22/2001 Document Revised: 01/04/2015 Document Reviewed: 04/13/2012 °Elsevier Interactive Patient Education ©2016 [employer] ° °

## 2016-03-04 NOTE — Anesthesia Postprocedure Evaluation (Signed)
Anesthesia Post Note  Patient: Drew FishRiley Firman  Procedure(s) Performed: Procedure(s) (LRB): LAPAROSCOPIC CHOLECYSTECTOMY  (N/A)  Patient location during evaluation: PACU Anesthesia Type: General Level of consciousness: awake and alert Pain management: pain level controlled Vital Signs Assessment: post-procedure vital signs reviewed and stable Respiratory status: spontaneous breathing, nonlabored ventilation, respiratory function stable and patient connected to nasal cannula oxygen Cardiovascular status: blood pressure returned to baseline and stable Postop Assessment: no signs of nausea or vomiting Anesthetic complications: no    Last Vitals:  Filed Vitals:   03/04/16 1439 03/04/16 1448  BP: 128/93 145/96  Pulse: 97 96  Temp: 36.4 C 37.1 C  Resp:      Last Pain:  Filed Vitals:   03/04/16 1601  PainSc: 6                  Cecile HearingStephen Edward Turk

## 2016-03-04 NOTE — Interval H&P Note (Signed)
History and Physical Interval Note:  03/04/2016 7:48 AM  Drew Prince  has presented today for surgery, with the diagnosis of Gallstone pancreatitis  The various methods of treatment have been discussed with the patient and family. After consideration of risks, benefits and other options for treatment, the patient has consented to  Procedure(s): LAPAROSCOPIC CHOLECYSTECTOMY WITH INTRAOPERATIVE CHOLANGIOGRAM (N/A) as a surgical intervention .  The patient's history has been reviewed, patient examined, no change in status, stable for surgery.  I have reviewed the patient's chart and labs.  Questions were answered to the patient's satisfaction.     Marigene Ehlersamirez Jr., Jed LimerickArmando

## 2016-03-04 NOTE — H&P (View-Only) (Signed)
History of Present Illness Axel Filler(Kiven Vangilder MD; 02/13/2016 9:09 AM) Patient words: reck.  The patient is a 40 year old male who presents for evaluation of gall stones. The patient is a 40 year old male who is previously seen in the hospital secondary to gallstone pancreatitis. Patient had a fairly turbulent course in the hospital. Patient has a large pseudocyst on CT scan as well as distal pancreatic ischemia. Patient ultimately was discharged has been doing well. He's had some on and off pain at subsides on its own.  Patient's CT scan also reveals no gallstones however a previous ultrasound of 01/06/2016 reveals gallstones in the gallbladder.     Other Problems Gilmer Mor(Sonya Bynum, CMA; 02/13/2016 8:44 AM) Cholelithiasis Depression Gastroesophageal Reflux Disease High blood pressure Hypercholesterolemia Pancreatitis  Past Surgical History Gilmer Mor(Sonya Bynum, CMA; 02/13/2016 8:44 AM) No pertinent past surgical history  Diagnostic Studies History Gilmer Mor(Sonya Bynum, CMA; 02/13/2016 8:44 AM) Colonoscopy never  Allergies (Sonya Bynum, CMA; 02/13/2016 8:46 AM) PenicillAMINE *Assorted Classes**  Medication History (Sonya Bynum, CMA; 02/13/2016 8:46 AM) BuPROPion HCl ER (XL) (150MG  Tablet ER 24HR, Oral) Active. Pantoprazole Sodium (40MG  Tablet DR, Oral) Active. Ondansetron (4MG  Tablet Disperse, Oral) Active. OxyCODONE HCl (5MG  Tablet, Oral) Active. Medications Reconciled  Social History Gilmer Mor(Sonya Bynum, CMA; 02/13/2016 8:44 AM) Alcohol use Occasional alcohol use. Caffeine use Carbonated beverages, Tea. No drug use Tobacco use Never smoker.  Family History Gilmer Mor(Sonya Bynum, CMA; 02/13/2016 8:44 AM) First Degree Relatives No pertinent family history    Review of Systems Lamar Laundry(Sonya Bynum CMA; 02/13/2016 8:44 AM) General Present- Fatigue. Not Present- Appetite Loss, Chills, Fever, Night Sweats, Weight Gain and Weight Loss. Skin Not Present- Change in Wart/Mole, Dryness, Hives, Jaundice, New  Lesions, Non-Healing Wounds, Rash and Ulcer. HEENT Present- Seasonal Allergies and Wears glasses/contact lenses. Not Present- Earache, Hearing Loss, Hoarseness, Nose Bleed, Oral Ulcers, Ringing in the Ears, Sinus Pain, Sore Throat, Visual Disturbances and Yellow Eyes. Respiratory Present- Snoring. Not Present- Bloody sputum, Chronic Cough, Difficulty Breathing and Wheezing. Breast Not Present- Breast Mass, Breast Pain, Nipple Discharge and Skin Changes. Cardiovascular Not Present- Chest Pain, Difficulty Breathing Lying Down, Leg Cramps, Palpitations, Rapid Heart Rate, Shortness of Breath and Swelling of Extremities. Gastrointestinal Present- Abdominal Pain, Change in Bowel Habits, Constipation and Nausea. Not Present- Bloating, Bloody Stool, Chronic diarrhea, Difficulty Swallowing, Excessive gas, Gets full quickly at meals, Hemorrhoids, Indigestion, Rectal Pain and Vomiting. Male Genitourinary Present- Change in Urinary Stream. Not Present- Blood in Urine, Frequency, Impotence, Nocturia, Painful Urination, Urgency and Urine Leakage. Musculoskeletal Present- Back Pain. Not Present- Joint Pain, Joint Stiffness, Muscle Pain, Muscle Weakness and Swelling of Extremities. Neurological Not Present- Decreased Memory, Fainting, Headaches, Numbness, Seizures, Tingling, Tremor, Trouble walking and Weakness. Psychiatric Present- Anxiety and Depression. Not Present- Bipolar, Change in Sleep Pattern, Fearful and Frequent crying. Endocrine Present- Cold Intolerance. Not Present- Excessive Hunger, Hair Changes, Heat Intolerance, Hot flashes and New Diabetes. Hematology Not Present- Easy Bruising, Excessive bleeding, Gland problems, HIV and Persistent Infections.  Vitals (Sonya Bynum CMA; 02/13/2016 8:45 AM) 02/13/2016 8:45 AM Weight: 168 lb Height: 64in Body Surface Area: 1.82 m Body Mass Index: 28.84 kg/m  Temp.: 21F(Temporal)  Pulse: 76 (Regular)  BP: 124/82 (Sitting, Left Arm,  Standard)       Physical Exam Axel Filler(Audrielle Vankuren, MD; 02/13/2016 9:11 AM) General Mental Status-Alert. General Appearance-Consistent with stated age. Hydration-Well hydrated. Voice-Normal.  Head and Neck Head-normocephalic, atraumatic with no lesions or palpable masses.  Eye Eyeball - Bilateral-Extraocular movements intact. Sclera/Conjunctiva - Bilateral-No scleral icterus.  Chest and Lung  Exam Inspection Chest Wall - Normal. Back - normal.  Cardiovascular Cardiovascular examination reveals -on palpation PMI is normal in location and amplitude, no palpable S3 or S4. Normal cardiac borders., normal heart sounds, regular rate and rhythm with no murmurs, carotid auscultation reveals no bruits and normal pedal pulses bilaterally.  Abdomen Inspection   Neurologic Neurologic evaluation reveals -alert and oriented x 3 with no impairment of recent or remote memory. Mental Status-Normal.  Musculoskeletal Normal Exam - Left-Upper Extremity Strength Normal and Lower Extremity Strength Normal. Normal Exam - Right-Upper Extremity Strength Normal, Lower Extremity Weakness.    Assessment & Plan (Justyce Yeater MD; 02/13/2016 9:10 AM) SYMPTOMATIC CHOLELITHIASIS (K80.20) Impression: 39-year-old male with gallstone pancreatitis. Patient did have the area of see a pancreatic pseudocyst on CT scan in the hospital.  1. We will proceed with laparoscopic cholecystectomy with IOC. 3.All risks and benefits were discussed with the patient to generally include: infection, bleeding, possible need for post op ERCP, damage to the bile ducts, and bile leak. Alternatives were offered and described.  All questions were answered and the patient voiced understanding of the procedure and wishes to proceed at this point with a laparoscopic cholecystectomy  

## 2016-03-05 ENCOUNTER — Encounter (HOSPITAL_COMMUNITY): Payer: Self-pay | Admitting: General Surgery

## 2016-03-07 ENCOUNTER — Emergency Department (HOSPITAL_COMMUNITY): Payer: BLUE CROSS/BLUE SHIELD

## 2016-03-07 ENCOUNTER — Inpatient Hospital Stay (HOSPITAL_COMMUNITY): Payer: BLUE CROSS/BLUE SHIELD

## 2016-03-07 ENCOUNTER — Inpatient Hospital Stay (HOSPITAL_COMMUNITY)
Admission: EM | Admit: 2016-03-07 | Discharge: 2016-03-30 | DRG: 907 | Disposition: A | Discharge status: Home / Self Care | Payer: BLUE CROSS/BLUE SHIELD | Attending: General Surgery | Admitting: General Surgery

## 2016-03-07 ENCOUNTER — Encounter (HOSPITAL_COMMUNITY): Payer: Self-pay | Admitting: *Deleted

## 2016-03-07 DIAGNOSIS — K219 Gastro-esophageal reflux disease without esophagitis: Secondary | ICD-10-CM | POA: Diagnosis present

## 2016-03-07 DIAGNOSIS — R1012 Left upper quadrant pain: Secondary | ICD-10-CM

## 2016-03-07 DIAGNOSIS — T508X5A Adverse effect of diagnostic agents, initial encounter: Secondary | ICD-10-CM | POA: Diagnosis present

## 2016-03-07 DIAGNOSIS — E785 Hyperlipidemia, unspecified: Secondary | ICD-10-CM | POA: Diagnosis present

## 2016-03-07 DIAGNOSIS — E872 Acidosis: Secondary | ICD-10-CM | POA: Diagnosis present

## 2016-03-07 DIAGNOSIS — R948 Abnormal results of function studies of other organs and systems: Secondary | ICD-10-CM | POA: Insufficient documentation

## 2016-03-07 DIAGNOSIS — K839 Disease of biliary tract, unspecified: Secondary | ICD-10-CM | POA: Insufficient documentation

## 2016-03-07 DIAGNOSIS — T39395A Adverse effect of other nonsteroidal anti-inflammatory drugs [NSAID], initial encounter: Secondary | ICD-10-CM | POA: Diagnosis present

## 2016-03-07 DIAGNOSIS — R1030 Lower abdominal pain, unspecified: Secondary | ICD-10-CM

## 2016-03-07 DIAGNOSIS — G8929 Other chronic pain: Secondary | ICD-10-CM | POA: Insufficient documentation

## 2016-03-07 DIAGNOSIS — R945 Abnormal results of liver function studies: Secondary | ICD-10-CM

## 2016-03-07 DIAGNOSIS — R188 Other ascites: Secondary | ICD-10-CM

## 2016-03-07 DIAGNOSIS — N141 Nephropathy induced by other drugs, medicaments and biological substances: Secondary | ICD-10-CM | POA: Diagnosis present

## 2016-03-07 DIAGNOSIS — E876 Hypokalemia: Secondary | ICD-10-CM | POA: Diagnosis not present

## 2016-03-07 DIAGNOSIS — R1011 Right upper quadrant pain: Secondary | ICD-10-CM

## 2016-03-07 DIAGNOSIS — M62838 Other muscle spasm: Secondary | ICD-10-CM | POA: Diagnosis not present

## 2016-03-07 DIAGNOSIS — R000 Tachycardia, unspecified: Secondary | ICD-10-CM | POA: Diagnosis present

## 2016-03-07 DIAGNOSIS — N179 Acute kidney failure, unspecified: Secondary | ICD-10-CM | POA: Diagnosis not present

## 2016-03-07 DIAGNOSIS — K91870 Postprocedural hematoma of a digestive system organ or structure following a digestive system procedure: Principal | ICD-10-CM | POA: Diagnosis present

## 2016-03-07 DIAGNOSIS — I10 Essential (primary) hypertension: Secondary | ICD-10-CM | POA: Diagnosis present

## 2016-03-07 DIAGNOSIS — R630 Anorexia: Secondary | ICD-10-CM | POA: Diagnosis present

## 2016-03-07 DIAGNOSIS — R7989 Other specified abnormal findings of blood chemistry: Secondary | ICD-10-CM

## 2016-03-07 DIAGNOSIS — K659 Peritonitis, unspecified: Secondary | ICD-10-CM | POA: Diagnosis present

## 2016-03-07 DIAGNOSIS — F329 Major depressive disorder, single episode, unspecified: Secondary | ICD-10-CM | POA: Diagnosis present

## 2016-03-07 DIAGNOSIS — R109 Unspecified abdominal pain: Secondary | ICD-10-CM | POA: Diagnosis not present

## 2016-03-07 DIAGNOSIS — K863 Pseudocyst of pancreas: Secondary | ICD-10-CM | POA: Diagnosis present

## 2016-03-07 DIAGNOSIS — Z9049 Acquired absence of other specified parts of digestive tract: Secondary | ICD-10-CM | POA: Diagnosis not present

## 2016-03-07 DIAGNOSIS — R1084 Generalized abdominal pain: Secondary | ICD-10-CM | POA: Diagnosis not present

## 2016-03-07 DIAGNOSIS — K859 Acute pancreatitis without necrosis or infection, unspecified: Secondary | ICD-10-CM

## 2016-03-07 LAB — CBC
HCT: 43.9 % (ref 39.0–52.0)
Hemoglobin: 14.5 g/dL (ref 13.0–17.0)
MCH: 26.9 pg (ref 26.0–34.0)
MCHC: 33 g/dL (ref 30.0–36.0)
MCV: 81.3 fL (ref 78.0–100.0)
PLATELETS: 275 10*3/uL (ref 150–400)
RBC: 5.4 MIL/uL (ref 4.22–5.81)
RDW: 14.1 % (ref 11.5–15.5)
WBC: 14.2 10*3/uL — AB (ref 4.0–10.5)

## 2016-03-07 LAB — URINALYSIS, ROUTINE W REFLEX MICROSCOPIC
GLUCOSE, UA: NEGATIVE mg/dL
HGB URINE DIPSTICK: NEGATIVE
Ketones, ur: 15 mg/dL — AB
Nitrite: NEGATIVE
Protein, ur: 100 mg/dL — AB
SPECIFIC GRAVITY, URINE: 1.044 — AB (ref 1.005–1.030)
pH: 5.5 (ref 5.0–8.0)

## 2016-03-07 LAB — COMPREHENSIVE METABOLIC PANEL
ALK PHOS: 74 U/L (ref 38–126)
ALT: 93 U/L — AB (ref 17–63)
AST: 53 U/L — AB (ref 15–41)
Albumin: 4 g/dL (ref 3.5–5.0)
Anion gap: 15 (ref 5–15)
BILIRUBIN TOTAL: 3.3 mg/dL — AB (ref 0.3–1.2)
BUN: 10 mg/dL (ref 6–20)
CALCIUM: 9.7 mg/dL (ref 8.9–10.3)
CHLORIDE: 98 mmol/L — AB (ref 101–111)
CO2: 28 mmol/L (ref 22–32)
CREATININE: 0.94 mg/dL (ref 0.61–1.24)
Glucose, Bld: 133 mg/dL — ABNORMAL HIGH (ref 65–99)
Potassium: 3.3 mmol/L — ABNORMAL LOW (ref 3.5–5.1)
Sodium: 141 mmol/L (ref 135–145)
TOTAL PROTEIN: 7.6 g/dL (ref 6.5–8.1)

## 2016-03-07 LAB — URINE MICROSCOPIC-ADD ON

## 2016-03-07 LAB — MRSA PCR SCREENING: MRSA BY PCR: NEGATIVE

## 2016-03-07 LAB — LIPASE, BLOOD
LIPASE: 21 U/L (ref 11–51)
Lipase: 17 U/L (ref 11–51)

## 2016-03-07 LAB — LACTIC ACID, PLASMA: LACTIC ACID, VENOUS: 0.8 mmol/L (ref 0.5–2.0)

## 2016-03-07 MED ORDER — IOHEXOL 300 MG/ML  SOLN
25.0000 mL | INTRAMUSCULAR | Status: AC
  Administered 2016-03-07 (×2): 25 mL via ORAL

## 2016-03-07 MED ORDER — ONDANSETRON 4 MG PO TBDP
4.0000 mg | ORAL_TABLET | Freq: Four times a day (QID) | ORAL | Status: DC | PRN
  Administered 2016-03-11: 4 mg via ORAL
  Filled 2016-03-07 (×2): qty 1

## 2016-03-07 MED ORDER — METRONIDAZOLE IN NACL 5-0.79 MG/ML-% IV SOLN
500.0000 mg | Freq: Three times a day (TID) | INTRAVENOUS | Status: DC
  Administered 2016-03-07 – 2016-03-10 (×10): 500 mg via INTRAVENOUS
  Filled 2016-03-07 (×9): qty 100

## 2016-03-07 MED ORDER — CIPROFLOXACIN IN D5W 400 MG/200ML IV SOLN
400.0000 mg | Freq: Two times a day (BID) | INTRAVENOUS | Status: DC
Start: 2016-03-07 — End: 2016-03-10
  Administered 2016-03-07 – 2016-03-10 (×7): 400 mg via INTRAVENOUS
  Filled 2016-03-07 (×8): qty 200

## 2016-03-07 MED ORDER — HYDROMORPHONE HCL 1 MG/ML IJ SOLN
1.0000 mg | Freq: Once | INTRAMUSCULAR | Status: AC
  Administered 2016-03-07: 1 mg via INTRAVENOUS
  Filled 2016-03-07: qty 1

## 2016-03-07 MED ORDER — ONDANSETRON HCL 4 MG/2ML IJ SOLN
4.0000 mg | Freq: Four times a day (QID) | INTRAMUSCULAR | Status: DC | PRN
  Administered 2016-03-07 – 2016-03-26 (×7): 4 mg via INTRAVENOUS
  Filled 2016-03-07 (×8): qty 2

## 2016-03-07 MED ORDER — TECHNETIUM TC 99M MEBROFENIN IV KIT
5.2800 | PACK | Freq: Once | INTRAVENOUS | Status: AC | PRN
  Administered 2016-03-07: 5 via INTRAVENOUS

## 2016-03-07 MED ORDER — ENOXAPARIN SODIUM 40 MG/0.4ML ~~LOC~~ SOLN
40.0000 mg | SUBCUTANEOUS | Status: DC
  Administered 2016-03-07 – 2016-03-08 (×2): 40 mg via SUBCUTANEOUS
  Filled 2016-03-07 (×3): qty 0.4

## 2016-03-07 MED ORDER — ONDANSETRON HCL 4 MG/2ML IJ SOLN
4.0000 mg | Freq: Once | INTRAMUSCULAR | Status: AC
  Administered 2016-03-07: 4 mg via INTRAVENOUS
  Filled 2016-03-07: qty 2

## 2016-03-07 MED ORDER — ACETAMINOPHEN 325 MG PO TABS
650.0000 mg | ORAL_TABLET | Freq: Once | ORAL | Status: AC
  Administered 2016-03-07: 650 mg via ORAL
  Filled 2016-03-07: qty 2

## 2016-03-07 MED ORDER — POTASSIUM CHLORIDE IN NACL 20-0.9 MEQ/L-% IV SOLN
INTRAVENOUS | Status: DC
  Administered 2016-03-07 – 2016-03-08 (×2): via INTRAVENOUS
  Administered 2016-03-09: 125 mL/h via INTRAVENOUS
  Filled 2016-03-07 (×7): qty 1000

## 2016-03-07 MED ORDER — METRONIDAZOLE IN NACL 5-0.79 MG/ML-% IV SOLN
500.0000 mg | Freq: Once | INTRAVENOUS | Status: AC
  Administered 2016-03-07: 500 mg via INTRAVENOUS
  Filled 2016-03-07: qty 100

## 2016-03-07 MED ORDER — HYDROMORPHONE HCL 1 MG/ML IJ SOLN
1.0000 mg | INTRAMUSCULAR | Status: DC | PRN
  Administered 2016-03-07 (×2): 2 mg via INTRAVENOUS
  Administered 2016-03-07: 1 mg via INTRAVENOUS
  Administered 2016-03-07 – 2016-03-08 (×3): 2 mg via INTRAVENOUS
  Administered 2016-03-08: 1 mg via INTRAVENOUS
  Administered 2016-03-08 – 2016-03-09 (×3): 2 mg via INTRAVENOUS
  Administered 2016-03-10 (×2): 1 mg via INTRAVENOUS
  Administered 2016-03-12 (×2): 2 mg via INTRAVENOUS
  Administered 2016-03-12 – 2016-03-17 (×2): 1 mg via INTRAVENOUS
  Administered 2016-03-18: 0.5 mg via INTRAVENOUS
  Administered 2016-03-18 (×3): 1 mg via INTRAVENOUS
  Administered 2016-03-19 – 2016-03-20 (×8): 2 mg via INTRAVENOUS
  Administered 2016-03-20: 1 mg via INTRAVENOUS
  Administered 2016-03-20 (×2): 2 mg via INTRAVENOUS
  Administered 2016-03-20: 1 mg via INTRAVENOUS
  Administered 2016-03-20 (×2): 2 mg via INTRAVENOUS
  Administered 2016-03-21 (×4): 1 mg via INTRAVENOUS
  Filled 2016-03-07: qty 1
  Filled 2016-03-07 (×2): qty 2
  Filled 2016-03-07: qty 1
  Filled 2016-03-07: qty 2
  Filled 2016-03-07: qty 1
  Filled 2016-03-07: qty 2
  Filled 2016-03-07: qty 1
  Filled 2016-03-07 (×4): qty 2
  Filled 2016-03-07: qty 1
  Filled 2016-03-07 (×2): qty 2
  Filled 2016-03-07: qty 1
  Filled 2016-03-07: qty 2
  Filled 2016-03-07: qty 1
  Filled 2016-03-07 (×5): qty 2
  Filled 2016-03-07: qty 1
  Filled 2016-03-07: qty 2
  Filled 2016-03-07: qty 1
  Filled 2016-03-07 (×2): qty 2
  Filled 2016-03-07 (×3): qty 1
  Filled 2016-03-07 (×2): qty 2
  Filled 2016-03-07: qty 1
  Filled 2016-03-07: qty 2
  Filled 2016-03-07: qty 1
  Filled 2016-03-07 (×2): qty 2

## 2016-03-07 MED ORDER — FENTANYL CITRATE (PF) 100 MCG/2ML IJ SOLN
INTRAMUSCULAR | Status: AC
Start: 2016-03-07 — End: 2016-03-07
  Filled 2016-03-07: qty 2

## 2016-03-07 MED ORDER — PANTOPRAZOLE SODIUM 40 MG IV SOLR
40.0000 mg | Freq: Every day | INTRAVENOUS | Status: DC
  Administered 2016-03-07 – 2016-03-10 (×4): 40 mg via INTRAVENOUS
  Filled 2016-03-07 (×4): qty 40

## 2016-03-07 MED ORDER — SODIUM CHLORIDE 0.9 % IV BOLUS (SEPSIS)
1000.0000 mL | Freq: Once | INTRAVENOUS | Status: AC
  Administered 2016-03-07: 1000 mL via INTRAVENOUS

## 2016-03-07 MED ORDER — SODIUM CHLORIDE 0.9 % IV BOLUS (SEPSIS)
2000.0000 mL | Freq: Once | INTRAVENOUS | Status: AC
  Administered 2016-03-07: 2000 mL via INTRAVENOUS

## 2016-03-07 MED ORDER — IOHEXOL 300 MG/ML  SOLN
100.0000 mL | Freq: Once | INTRAMUSCULAR | Status: AC | PRN
  Administered 2016-03-07: 100 mL via INTRAVENOUS

## 2016-03-07 MED ORDER — HYDROMORPHONE HCL 1 MG/ML IJ SOLN
1.0000 mg | INTRAMUSCULAR | Status: AC | PRN
  Administered 2016-03-07 (×3): 1 mg via INTRAVENOUS
  Filled 2016-03-07 (×3): qty 1

## 2016-03-07 MED ORDER — DEXTROSE 5 % IV SOLN
1.0000 g | Freq: Once | INTRAVENOUS | Status: AC
  Administered 2016-03-07: 1 g via INTRAVENOUS
  Filled 2016-03-07: qty 1

## 2016-03-07 MED ORDER — ACETAMINOPHEN 650 MG RE SUPP
650.0000 mg | Freq: Once | RECTAL | Status: AC
  Administered 2016-03-07: 650 mg via RECTAL
  Filled 2016-03-07: qty 1

## 2016-03-07 MED ORDER — FENTANYL CITRATE (PF) 100 MCG/2ML IJ SOLN
50.0000 ug | Freq: Once | INTRAMUSCULAR | Status: AC
  Administered 2016-03-07: 50 ug via INTRAVENOUS

## 2016-03-07 NOTE — ED Notes (Signed)
Pt vomiting. MD made aware of patient's temperature, VS, and current status. Placing orders.

## 2016-03-07 NOTE — ED Notes (Signed)
Phlebotomy at the bedside  

## 2016-03-07 NOTE — ED Notes (Signed)
CT called to transport patient

## 2016-03-07 NOTE — Progress Notes (Signed)
Pt temp 101.2, paged MD on call, no new orders received; told to call back if temp >102. Will continue to monitor closely.

## 2016-03-07 NOTE — ED Provider Notes (Signed)
I received this patient in signout from Dr. Patria Maneampos, who evaluated him for fever and abdominal pain in the setting of recent laparoscopic cholecystectomy. We were awaiting CT scan results. CT showed some free fluid and gas within the surgical bed, not unexpected given the patient's recent surgery. No obvious explanation for the patient's fever, abdominal distention, peritonitis, and elevated bilirubin. Consulted general surgery. Pt evaluated by Dr. Lindie SpruceWyatt, who will admit for further work up. GI also consulted for evaluation. Pt admitted to surgery for further care.  Laurence Spatesachel Morgan Elliott Quade, MD 03/07/16 1228

## 2016-03-07 NOTE — Consult Note (Signed)
On call for Dr. Elnoria Howard   Consultation  Referring Provider:     Frederik Schmidt, MD Primary Care Physician:  Darreld Mclean, MD Primary Gastroenterologist:        Dr. Elnoria Howard Reason for Consultation:     Abdominal pain after lap chole     Impression / Plan:   Severe abdominal pain after lap chole - unclear etiology No bile leak per HIDA Pancreatic pseudocyst after acute gallstone pancreatitis - no pancreatitis by CT/labs now  Would continue current care w/ Abx and analgesics If clinical suspicion for retained stone then would do MRCP - not sure what hsi problem is now - ? Post-op infection - though CT shows expected post-op findings he has had fever          HPI:   Drew Prince is a 40 y.o. male s/p lap chole 3/8 - did ok then POD 1 some abdominal pain worsening each day until severe pain lower abdomen and presented. Admitted by Dr. Lindie Spruce. HIDA scan neg for leak. Feels better w/ current Tx. No other GI sxs but some nausea and vomiting. Has been febrile. Pain reminds him some of the pancreatitis.  Past Medical History  Diagnosis Date  . Hypercholesteremia   . Hypertension     01-05-16 to 02-03-16 Hospitalized for pancreatitis, B/P med discontinued during that time.  . H/O seasonal allergies     OTC med used  . Depression   . GERD (gastroesophageal reflux disease)   . Acute pancreatitis     1-8-17to 02-03-16 Hospital stay at [facility].    Past Surgical History  Procedure Laterality Date  . Wisdom tooth extraction      "local only"  . Cholecystectomy N/A 03/04/2016    Procedure: LAPAROSCOPIC CHOLECYSTECTOMY ;  Surgeon: Axel Filler, MD;  Location: [facility];  Service: General;  Laterality: N/A;    Family History  Problem Relation Age of Onset  . Hypertension Father     Social History  Substance Use Topics  . Smoking status: Never Smoker   . Smokeless tobacco: None  . Alcohol Use: 0.0 oz/week    0 Standard drinks or equivalent per week     Comment: rarely    Prior to Admission  medications   Medication Sig Start Date End Date Taking? Authorizing Provider  buPROPion (WELLBUTRIN XL) 150 MG 24 hr tablet Take 150 mg by mouth daily.   Yes Historical Provider, MD  ibuprofen (ADVIL,MOTRIN) 200 MG tablet Take 200 mg by mouth every 6 (six) hours as needed for mild pain.   Yes Historical Provider, MD  ondansetron (ZOFRAN-ODT) 4 MG disintegrating tablet Take 1-2 tablets (4-8 mg total) by mouth every 8 (eight) hours as needed for nausea or vomiting. 02/10/16  Yes Ejiroghene E Emokpae, MD  oxyCODONE (OXY IR/ROXICODONE) 5 MG immediate release tablet Take 1-2 tablets (5-10 mg total) by mouth every 8 (eight) hours as needed for severe pain. 02/24/16  Yes Courtney Paris, MD  oxyCODONE-acetaminophen (ROXICET) 5-325 MG tablet Take 1-2 tablets by mouth every 4 (four) hours as needed. 03/04/16  Yes Axel Filler, MD  pantoprazole (PROTONIX) 40 MG tablet Take 1 tablet (40 mg total) by mouth daily. 02/03/16  Yes Selina Cooley, MD    Current Facility-Administered Medications  Medication Dose Route Frequency Provider Last Rate Last Dose  . 0.9 % NaCl with KCl 20 mEq/ L  infusion   Intravenous Continuous Jimmye Norman, MD 125 mL/hr at 03/07/16 1535    . ciprofloxacin (CIPRO) IVPB 400 mg  400 mg Intravenous Q12H Jimmye NormanJames Wyatt, MD   Stopped at 03/07/16 1258   And  . metroNIDAZOLE (FLAGYL) IVPB 500 mg  500 mg Intravenous Q8H Jimmye NormanJames Wyatt, MD   Stopped at 03/07/16 1456  . enoxaparin (LOVENOX) injection 40 mg  40 mg Subcutaneous Q24H Jimmye NormanJames Wyatt, MD   40 mg at 03/07/16 1706  . HYDROmorphone (DILAUDID) injection 1-2 mg  1-2 mg Intravenous Q2H PRN Jimmye NormanJames Wyatt, MD   2 mg at 03/07/16 1706  . ondansetron (ZOFRAN-ODT) disintegrating tablet 4 mg  4 mg Oral Q6H PRN Jimmye NormanJames Wyatt, MD       Or  . ondansetron [facility](ZOFRAN) injection 4 mg  4 mg Intravenous Q6H PRN Jimmye NormanJames Wyatt, MD   4 mg at 03/07/16 1607  . pantoprazole (PROTONIX) injection 40 mg  40 mg Intravenous QHS Jimmye NormanJames Wyatt, MD        Allergies as of 03/07/2016 - Review  Complete 03/07/2016  Allergen Reaction Noted  . Penicillins Rash 01/05/2016     Review of Systems:    This is positive for those things mentioned in the HPI All other review of systems are negative.     Physical Exam:  Vital signs in last 24 hours: Temp:  [98.2 F (36.8 C)-102.2 F (39 C)] 99.4 F (37.4 C) (03/11 1801) Pulse Rate:  [118-144] 119 (03/11 1657) Resp:  [18-40] 26 (03/11 1657) BP: (132-163)/(90-104) 136/92 mmHg (03/11 1657) SpO2:  [87 %-99 %] 96 % (03/11 1657) Weight:  [175 lb 4.3 oz (79.5 kg)] 175 lb 4.3 oz (79.5 kg) (03/11 1643)    General:  Well-developed, well-nourished and in no acute distress Eyes:  anicteric. Lungs: Clear to auscultation bilaterally. Heart:  S1S2, no rubs, murmurs, gallops. Abdomen:  soft, diffusely-tender w/o peritoneal findings now, BS+ decreased. , lap chole scars/stteristrips in place Extremities:   no edema Skin   no rash. Neuro:  A&O x 3.  Psych:  appropriate mood and  Affect.   Data Reviewed:   LAB RESULTS:  Recent Labs  03/07/16 0215  WBC 14.2*  HGB 14.5  HCT 43.9  PLT 275   BMET  Recent Labs  03/07/16 0215  NA 141  K 3.3*  CL 98*  CO2 28  GLUCOSE 133*  BUN 10  CREATININE 0.94  CALCIUM 9.7   LFT  Recent Labs  03/07/16 0215  PROT 7.6  ALBUMIN 4.0  AST 53*  ALT 93*  ALKPHOS 74  BILITOT 3.3*   STUDIES: Nm Hepatobiliary Including Gb  03/07/2016  CLINICAL DATA:  Status post cholecystectomy, abnormal LFTs, evaluate for bile leak versus CBD stone EXAM: NUCLEAR MEDICINE HEPATOBILIARY IMAGING TECHNIQUE: Sequential images of the abdomen were obtained out to 60 minutes following intravenous administration of radiopharmaceutical. RADIOPHARMACEUTICALS:  5.28 mCi mCi Tc-6929m Choletec IV COMPARISON:  CT abdomen pelvis dated 03/07/2016 at 0813 hours FINDINGS: Normal hepatic excretion. Visualization of the proximal small bowel within 15 minutes, confirming common duct patency. No extravasation of radiotracer to  suggest a bile leak. IMPRESSION: No extravasation radiotracer to suggest a bile leak. Patent common duct. Electronically Signed   By: Charline BillsSriyesh  Krishnan M.D.   On: 03/07/2016 14:52   Ct Abdomen Pelvis W Contrast  03/07/2016  CLINICAL DATA:  Right-sided abdominal pain following cholecystectomy 3 days ago. EXAM: CT ABDOMEN AND PELVIS WITH CONTRAST TECHNIQUE: Multidetector CT imaging of the abdomen and pelvis was performed using the standard protocol following bolus administration of intravenous contrast. CONTRAST:  100mL OMNIPAQUE IOHEXOL 300 MG/ML  SOLN COMPARISON:  Abdominal ultrasound  02/28/2016. CT of the abdomen and pelvis 01/25/2016. FINDINGS: Lower chest: Asymmetric right-sided basilar airspace disease is present. This most likely reflects atelectasis. Early infection is not excluded. The heart size is normal. No significant pleural or pericardial effusions are present. Hepatobiliary: There is mild diffuse fatty infiltration of the liver. Gas inflammatory changes are present within the gallbladder fossa following cholecystectomy. There is no discrete fluid collection. The common bile duct is within normal limits. A small amount of perihepatic fluid is evident. Pancreas: A complex peripancreatic fluid collection is slightly decreased in size since the prior exam. The collection now measures 6.1 x 8.8 x 3.3 cm. The pancreas otherwise enhances normally. Spleen: Within normal limits. Adrenals/Urinary Tract: The adrenal glands are normal bilaterally. The kidneys and ureters are within normal limits. The urinary bladder is unremarkable. Stomach/Bowel: The stomach is within normal limits. There are secondary inflammatory changes within the second and third portions of the duodenum. The small bowel is unremarkable. The cecum and appendix are within normal limits. There are inflammatory changes about the colon at the hepatic flexure including wall thickening. The more distal transverse colon is normal. The descending  and rectosigmoid colon is within normal limits. Vascular/Lymphatic: No significant vascular lesions are present. Sub cm para-aortic lymph nodes are likely reactive. Reproductive: Within normal limits. Other: A small amount of layering fluid is present in the anatomic pelvis. Musculoskeletal: The bone windows demonstrate a vacuum disc at L5-S1. No other focal lesions are present. IMPRESSION: 1. Status post cholecystectomy. 2. Gas and fluid within they surgical bed is likely within normal limits 3 days following surgery. 3. A small amount of fluid is also present about the liver and within the anatomic pelvis, likely within normal limits. 4. Inflammatory changes are present at the level of the hepatic flexure of the colon. This is likely secondary. No discrete abscess is present. 5. Decreasing size of complex peripancreatic fluid collection compatible with pancreatic pseudocyst. The pancreas enhances throughout. 6. Diffuse fatty infiltration of the liver. Electronically Signed   By: Marin Roberts M.D.   On: 03/07/2016 08:42   Dg Chest Port 1 View  03/07/2016  CLINICAL DATA:  40 year old male with a history of abdominal pain EXAM: PORTABLE CHEST 1 VIEW COMPARISON:  01/10/2016 FINDINGS: Low lung volumes. Cardiomediastinal silhouette likely unchanged, accentuated by the low lung volumes and positioning. Interstitial markings without confluent airspace disease. Blunting at the left costophrenic angle. No pneumothorax. Interval removal of the right-sided PICC IMPRESSION: Low lung volumes, with interstitial prominence and blunting at the left costophrenic angle. While these findings are likely secondary to atelectasis and the positioning of this single frontal view, left basilar process not excluded. If there is concern for further evaluation, a dedicated PA and lateral chest x-ray would be useful. Interval removal of right PICC Signed, Yvone Neu. Loreta Ave, DO [facility]  [facility] Electronically Signed   By: Gilmer Mor D.O.   On: 03/07/2016 09:25     PREVIOUS ENDOSCOPIES:            none   Thanks   LOS: 0 days    Sena Slate, MD, [facility] @  03/07/2016, 6:33 PM

## 2016-03-07 NOTE — H&P (Signed)
Drew Prince is an 40 y.o. male.   Chief Complaint: Abdominal pain after laparoscopic cholecystectomy HPI: Patient was admitted for one month in January with acute gallstone pancreatitis, discharged and came back to [facility] for lap cholecystectomy without IOC 3 days ago.  Now has worsening diffuse abdominal pain with nausea and vomiting.  Low grade fever, abnormal LFTs and possibly retained CBD stone or bile leak.  Past Medical History  Diagnosis Date  . Hypercholesteremia   . Hypertension     01-05-16 to 02-03-16 Hospitalized for pancreatitis, B/P med discontinued during that time.  . H/O seasonal allergies     OTC med used  . Depression   . GERD (gastroesophageal reflux disease)   . Acute pancreatitis     1-8-17to 02-03-16 Hospital stay at [facility].    Past Surgical History  Procedure Laterality Date  . Wisdom tooth extraction      "local only"  . Cholecystectomy N/A 03/04/2016    Procedure: LAPAROSCOPIC CHOLECYSTECTOMY ;  Surgeon: Ralene Ok, MD;  Location: [facility];  Service: General;  Laterality: N/A;    Family History  Problem Relation Age of Onset  . Hypertension Father    Social History:  reports that he has never smoked. He does not have any smokeless tobacco history on file. He reports that he drinks alcohol. He reports that he does not use illicit drugs.  Allergies:  Allergies  Allergen Reactions  . Penicillins Rash    .Has patient had a PCN reaction causing immediate rash, facial/tongue/throat swelling, SOB or lightheadedness with hypotension: No Has patient had a PCN reaction causing severe rash involving mucus membranes or skin necrosis: No Has patient had a PCN reaction that required hospitalization No Has patient had a PCN reaction occurring within the last 10 years: No If all of the above answers are "NO", then may proceed with Cephalosporin use.      (Not in a hospital admission)  Results for orders placed or performed during the hospital encounter of 03/07/16  (from the past 48 hour(s))  Urinalysis, Routine w reflex microscopic (not at [facility])     Status: Abnormal   Collection Time: 03/07/16  2:13 AM  Result Value Ref Range   Color, Urine ORANGE (A) YELLOW    Comment: BIOCHEMICALS MAY BE AFFECTED BY COLOR   APPearance CLOUDY (A) CLEAR   Specific Gravity, Urine 1.044 (H) 1.005 - 1.030   pH 5.5 5.0 - 8.0   Glucose, UA NEGATIVE NEGATIVE mg/dL   Hgb urine dipstick NEGATIVE NEGATIVE   Bilirubin Urine MODERATE (A) NEGATIVE   Ketones, ur 15 (A) NEGATIVE mg/dL   Protein, ur 100 (A) NEGATIVE mg/dL   Nitrite NEGATIVE NEGATIVE   Leukocytes, UA TRACE (A) NEGATIVE  Urine microscopic-add on     Status: Abnormal   Collection Time: 03/07/16  2:13 AM  Result Value Ref Range   Squamous Epithelial / LPF 0-5 (A) NONE SEEN   WBC, UA 0-5 0 - 5 WBC/hpf   RBC / HPF 0-5 0 - 5 RBC/hpf   Bacteria, UA RARE (A) NONE SEEN   Urine-Other MUCOUS PRESENT     Comment: LESS THAN 10 mL OF URINE SUBMITTED  Lipase, blood     Status: None   Collection Time: 03/07/16  2:15 AM  Result Value Ref Range   Lipase 17 11 - 51 U/L  Comprehensive metabolic panel     Status: Abnormal   Collection Time: 03/07/16  2:15 AM  Result Value Ref Range   Sodium 141  135 - 145 mmol/L   Potassium 3.3 (L) 3.5 - 5.1 mmol/L   Chloride 98 (L) 101 - 111 mmol/L   CO2 28 22 - 32 mmol/L   Glucose, Bld 133 (H) 65 - 99 mg/dL   BUN 10 6 - 20 mg/dL   Creatinine, Ser 0.94 0.61 - 1.24 mg/dL   Calcium 9.7 8.9 - 10.3 mg/dL   Total Protein 7.6 6.5 - 8.1 g/dL   Albumin 4.0 3.5 - 5.0 g/dL   AST 53 (H) 15 - 41 U/L   ALT 93 (H) 17 - 63 U/L   Alkaline Phosphatase 74 38 - 126 U/L   Total Bilirubin 3.3 (H) 0.3 - 1.2 mg/dL   GFR calc non Af Amer >60 >60 mL/min   GFR calc Af Amer >60 >60 mL/min    Comment: (NOTE) The eGFR has been calculated using the CKD EPI equation. This calculation has not been validated in all clinical situations. eGFR's persistently <60 mL/min signify possible Chronic Kidney Disease.     Anion gap 15 5 - 15  CBC     Status: Abnormal   Collection Time: 03/07/16  2:15 AM  Result Value Ref Range   WBC 14.2 (H) 4.0 - 10.5 K/uL   RBC 5.40 4.22 - 5.81 MIL/uL   Hemoglobin 14.5 13.0 - 17.0 g/dL   HCT 43.9 39.0 - 52.0 %   MCV 81.3 78.0 - 100.0 fL   MCH 26.9 26.0 - 34.0 pg   MCHC 33.0 30.0 - 36.0 g/dL   RDW 14.1 11.5 - 15.5 %   Platelets 275 150 - 400 K/uL  Lactic acid, plasma     Status: None   Collection Time: 03/07/16  5:37 AM  Result Value Ref Range   Lactic Acid, Venous 0.8 0.5 - 2.0 mmol/L  Lipase, blood     Status: None   Collection Time: 03/07/16 10:44 AM  Result Value Ref Range   Lipase 21 11 - 51 U/L   Ct Abdomen Pelvis W Contrast  03/07/2016  CLINICAL DATA:  Right-sided abdominal pain following cholecystectomy 3 days ago. EXAM: CT ABDOMEN AND PELVIS WITH CONTRAST TECHNIQUE: Multidetector CT imaging of the abdomen and pelvis was performed using the standard protocol following bolus administration of intravenous contrast. CONTRAST:  113m OMNIPAQUE IOHEXOL 300 MG/ML  SOLN COMPARISON:  Abdominal ultrasound 02/28/2016. CT of the abdomen and pelvis 01/25/2016. FINDINGS: Lower chest: Asymmetric right-sided basilar airspace disease is present. This most likely reflects atelectasis. Early infection is not excluded. The heart size is normal. No significant pleural or pericardial effusions are present. Hepatobiliary: There is mild diffuse fatty infiltration of the liver. Gas inflammatory changes are present within the gallbladder fossa following cholecystectomy. There is no discrete fluid collection. The common bile duct is within normal limits. A small amount of perihepatic fluid is evident. Pancreas: A complex peripancreatic fluid collection is slightly decreased in size since the prior exam. The collection now measures 6.1 x 8.8 x 3.3 cm. The pancreas otherwise enhances normally. Spleen: Within normal limits. Adrenals/Urinary Tract: The adrenal glands are normal bilaterally. The  kidneys and ureters are within normal limits. The urinary bladder is unremarkable. Stomach/Bowel: The stomach is within normal limits. There are secondary inflammatory changes within the second and third portions of the duodenum. The small bowel is unremarkable. The cecum and appendix are within normal limits. There are inflammatory changes about the colon at the hepatic flexure including wall thickening. The more distal transverse colon is normal. The descending and rectosigmoid colon  is within normal limits. Vascular/Lymphatic: No significant vascular lesions are present. Sub cm para-aortic lymph nodes are likely reactive. Reproductive: Within normal limits. Other: A small amount of layering fluid is present in the anatomic pelvis. Musculoskeletal: The bone windows demonstrate a vacuum disc at L5-S1. No other focal lesions are present. IMPRESSION: 1. Status post cholecystectomy. 2. Gas and fluid within they surgical bed is likely within normal limits 3 days following surgery. 3. A small amount of fluid is also present about the liver and within the anatomic pelvis, likely within normal limits. 4. Inflammatory changes are present at the level of the hepatic flexure of the colon. This is likely secondary. No discrete abscess is present. 5. Decreasing size of complex peripancreatic fluid collection compatible with pancreatic pseudocyst. The pancreas enhances throughout. 6. Diffuse fatty infiltration of the liver. Electronically Signed   By: San Morelle M.D.   On: 03/07/2016 08:42   Dg Chest Port 1 View  03/07/2016  CLINICAL DATA:  39 year old male with a history of abdominal pain EXAM: PORTABLE CHEST 1 VIEW COMPARISON:  01/10/2016 FINDINGS: Low lung volumes. Cardiomediastinal silhouette likely unchanged, accentuated by the low lung volumes and positioning. Interstitial markings without confluent airspace disease. Blunting at the left costophrenic angle. No pneumothorax. Interval removal of the right-sided  PICC IMPRESSION: Low lung volumes, with interstitial prominence and blunting at the left costophrenic angle. While these findings are likely secondary to atelectasis and the positioning of this single frontal view, left basilar process not excluded. If there is concern for further evaluation, a dedicated PA and lateral chest x-ray would be useful. Interval removal of right PICC Signed, Dulcy Fanny. Earleen Newport, DO [facility] Electronically Signed   By: Corrie Mckusick D.O.   On: 03/07/2016 09:25    Review of Systems  Constitutional: Positive for fever and chills.  Gastrointestinal: Positive for nausea and abdominal pain.  Genitourinary: Negative.   Musculoskeletal: Negative.   Neurological: Negative.   Endo/Heme/Allergies: Negative.   Psychiatric/Behavioral: Negative.     Blood pressure 138/94, pulse 125, temperature 99.3 F (37.4 C), temperature source Oral, resp. rate 20, SpO2 98 %. Physical Exam  Constitutional: He is oriented to person, place, and time. He appears well-developed and well-nourished.  HENT:  Head: Normocephalic and atraumatic.  Eyes: Pupils are equal, round, and reactive to light. Scleral icterus is present.  Neck: Normal range of motion. Neck supple.  Cardiovascular: Regular rhythm.   tachycardia  Respiratory: Effort normal and breath sounds normal.  GI: Soft. Normal appearance and normal aorta. He exhibits distension. Bowel sounds are absent. There is tenderness in the epigastric area, left upper quadrant and left lower quadrant. There is rebound and guarding. There is no rigidity.  Musculoskeletal: Normal range of motion.  Neurological: He is alert and oriented to person, place, and time. He has normal reflexes.  Skin: Skin is warm and dry.     Assessment/Plan Abdominal pain and peritonitis status post lap chole. Abnormal LFT with Tbili of 3.3, normal alkaline phosphatase  Possibel bile leak versus CBD  stone. Have asked for GI consultation and I have ordered a HIDA scan to look for a bile leak.  Judeth Horn, MD 03/07/2016, 12:11 PM

## 2016-03-07 NOTE — ED Provider Notes (Signed)
CSN: [id number]     Arrival date & time 03/07/16  0146 History   First MD Initiated Contact with Patient 03/07/16 0500     Chief Complaint  Patient presents with  . Abdominal Pain      HPI Patient presents to the emergency department 3 days postop from a laparoscopic cholecystectomy after he was treated for ulcerative pancreatitis.  He states over the past 24 hours he has had fever and chills as well as nausea without vomiting.  He reports severe right-sided abdominal pain that feels to be worsening to him.  His pain is severe in severity at this time and worse with palpation and movement of the right side of his abdomen.   Past Medical History  Diagnosis Date  . Hypercholesteremia   . Hypertension     01-05-16 to 02-03-16 Hospitalized for pancreatitis, B/P med discontinued during that time.  . H/O seasonal allergies     OTC med used  . Depression   . GERD (gastroesophageal reflux disease)   . Acute pancreatitis     1-8-17to 02-03-16 Hospital stay at [facility].   Past Surgical History  Procedure Laterality Date  . Wisdom tooth extraction      "local only"  . Cholecystectomy N/A 03/04/2016    Procedure: LAPAROSCOPIC CHOLECYSTECTOMY ;  Surgeon: Axel Filler, MD;  Location: [facility];  Service: General;  Laterality: N/A;   Family History  Problem Relation Age of Onset  . Hypertension Father    Social History  Substance Use Topics  . Smoking status: Never Smoker   . Smokeless tobacco: None  . Alcohol Use: 0.0 oz/week    0 Standard drinks or equivalent per week     Comment: rarely    Review of Systems  All other systems reviewed and are negative.     Allergies  Penicillins  Home Medications   Prior to Admission medications   Medication Sig Start Date End Date Taking? Authorizing Provider  buPROPion (WELLBUTRIN XL) 150 MG 24 hr tablet Take 150 mg by mouth daily.   Yes Historical Provider, MD  ibuprofen (ADVIL,MOTRIN) 200 MG tablet Take 200 mg by mouth every 6 (six) hours as  needed for mild pain.   Yes Historical Provider, MD  ondansetron (ZOFRAN-ODT) 4 MG disintegrating tablet Take 1-2 tablets (4-8 mg total) by mouth every 8 (eight) hours as needed for nausea or vomiting. 02/10/16  Yes Ejiroghene E Emokpae, MD  oxyCODONE (OXY IR/ROXICODONE) 5 MG immediate release tablet Take 1-2 tablets (5-10 mg total) by mouth every 8 (eight) hours as needed for severe pain. 02/24/16  Yes Courtney Paris, MD  oxyCODONE-acetaminophen (ROXICET) 5-325 MG tablet Take 1-2 tablets by mouth every 4 (four) hours as needed. 03/04/16  Yes Axel Filler, MD  pantoprazole (PROTONIX) 40 MG tablet Take 1 tablet (40 mg total) by mouth daily. 02/03/16  Yes Selina Cooley, MD   BP 153/104 mmHg  Pulse 136  Temp(Src) 99.7 F (37.6 C) (Oral)  Resp 18  SpO2 91% Physical Exam  Constitutional: He is oriented to person, place, and time. He appears well-developed and well-nourished.  HENT:  Head: Normocephalic and atraumatic.  Eyes: EOM are normal.  Neck: Normal range of motion.  Cardiovascular: Regular rhythm, normal heart sounds and intact distal pulses.   Tachycardic  Pulmonary/Chest:  Diminished breath sounds in the bases.  Mild tachypnea without accessory muscle use.  Abdominal: Soft. He exhibits no distension.  Severe right upper quadrant and right lower quadrant tenderness with guarding.  No rebound.  Musculoskeletal: Normal range of motion.  Neurological: He is alert and oriented to person, place, and time.  Skin: Skin is warm and dry.  Psychiatric: He has a normal mood and affect. Judgment normal.  Nursing note and vitals reviewed.   ED Course  Procedures (including critical care time) Labs Review Labs Reviewed  COMPREHENSIVE METABOLIC PANEL - Abnormal; Notable for the following:    Potassium 3.3 (*)    Chloride 98 (*)    Glucose, Bld 133 (*)    AST 53 (*)    ALT 93 (*)    Total Bilirubin 3.3 (*)    All other components within normal limits  CBC - Abnormal; Notable for the following:     WBC 14.2 (*)    All other components within normal limits  URINALYSIS, ROUTINE W REFLEX MICROSCOPIC (NOT [facility]) - Abnormal; Notable for the following:    Color, Urine ORANGE (*)    APPearance CLOUDY (*)    Specific Gravity, Urine 1.044 (*)    Bilirubin Urine MODERATE (*)    Ketones, ur 15 (*)    Protein, ur 100 (*)    Leukocytes, UA TRACE (*)    All other components within normal limits  URINE MICROSCOPIC-ADD ON - Abnormal; Notable for the following:    Squamous Epithelial / LPF 0-5 (*)    Bacteria, UA RARE (*)    All other components within normal limits  LIPASE, BLOOD  LACTIC ACID, PLASMA    Imaging Review No results found. I have personally reviewed and evaluated these images and lab results as part of my medical decision-making.   EKG Interpretation None      MDM   Final diagnoses:  None    Patient given antibiotics early on including cefepime and Flagyl for possible intra-abdominal infection.  He does have elevation in his LFT and bilirubin as well as his white blood cell count.  This could represent an ascending cholangitis.  He could have a retained common bile duct stone.  He was treated aggressively with IV resuscitation.  He continues to be tachycardic and febrile.  He will receive rectal Tylenol at this time.  He is undergoing a CT scan.  Care was transferred to Dr. Clarene DukeLittle with CT pending.  Patient will need general surgery consultation after CT imaging      Azalia BilisKevin Cosima Prentiss, MD 03/07/16 [phone]

## 2016-03-07 NOTE — ED Notes (Signed)
The pt is c/o rt sided abd pain since he had his gallbladder removed 3-8. Elevated temp today.  He last had percocet 0030am.  No redness along the 3 incisions no drainage  Last bm earlier today

## 2016-03-07 NOTE — ED Notes (Signed)
Pt transported to nuclear medicine. 

## 2016-03-08 ENCOUNTER — Inpatient Hospital Stay (HOSPITAL_COMMUNITY): Payer: BLUE CROSS/BLUE SHIELD

## 2016-03-08 LAB — GLUCOSE, CAPILLARY
GLUCOSE-CAPILLARY: 69 mg/dL (ref 65–99)
GLUCOSE-CAPILLARY: 81 mg/dL (ref 65–99)
Glucose-Capillary: 117 mg/dL — ABNORMAL HIGH (ref 65–99)
Glucose-Capillary: 118 mg/dL — ABNORMAL HIGH (ref 65–99)
Glucose-Capillary: 96 mg/dL (ref 65–99)

## 2016-03-08 LAB — CBC
HCT: 38.8 % — ABNORMAL LOW (ref 39.0–52.0)
Hemoglobin: 12.5 g/dL — ABNORMAL LOW (ref 13.0–17.0)
MCH: 26 pg (ref 26.0–34.0)
MCHC: 32.2 g/dL (ref 30.0–36.0)
MCV: 80.8 fL (ref 78.0–100.0)
Platelets: 220 10*3/uL (ref 150–400)
RBC: 4.8 MIL/uL (ref 4.22–5.81)
RDW: 14.3 % (ref 11.5–15.5)
WBC: 6.1 10*3/uL (ref 4.0–10.5)

## 2016-03-08 LAB — COMPREHENSIVE METABOLIC PANEL
ALK PHOS: 105 U/L (ref 38–126)
ALT: 125 U/L — AB (ref 17–63)
AST: 68 U/L — AB (ref 15–41)
Albumin: 3 g/dL — ABNORMAL LOW (ref 3.5–5.0)
Anion gap: 13 (ref 5–15)
BILIRUBIN TOTAL: 3.9 mg/dL — AB (ref 0.3–1.2)
BUN: 6 mg/dL (ref 6–20)
CALCIUM: 8.5 mg/dL — AB (ref 8.9–10.3)
CHLORIDE: 101 mmol/L (ref 101–111)
CO2: 23 mmol/L (ref 22–32)
CREATININE: 0.89 mg/dL (ref 0.61–1.24)
Glucose, Bld: 120 mg/dL — ABNORMAL HIGH (ref 65–99)
Potassium: 3.7 mmol/L (ref 3.5–5.1)
Sodium: 137 mmol/L (ref 135–145)
Total Protein: 6.1 g/dL — ABNORMAL LOW (ref 6.5–8.1)

## 2016-03-08 LAB — LACTIC ACID, PLASMA
Lactic Acid, Venous: 2.2 mmol/L (ref 0.5–2.0)
Lactic Acid, Venous: 1.5 mmol/L (ref 0.5–2.0)

## 2016-03-08 MED ORDER — IBUPROFEN 200 MG PO TABS
800.0000 mg | ORAL_TABLET | Freq: Once | ORAL | Status: AC
  Administered 2016-03-08: 800 mg via ORAL
  Filled 2016-03-08: qty 4

## 2016-03-08 MED ORDER — VANCOMYCIN HCL IN DEXTROSE 1-5 GM/200ML-% IV SOLN
1000.0000 mg | Freq: Three times a day (TID) | INTRAVENOUS | Status: DC
  Administered 2016-03-08 – 2016-03-09 (×5): 1000 mg via INTRAVENOUS
  Filled 2016-03-08 (×7): qty 200

## 2016-03-08 MED ORDER — INSULIN ASPART 100 UNIT/ML ~~LOC~~ SOLN
0.0000 [IU] | SUBCUTANEOUS | Status: DC
  Administered 2016-03-10 – 2016-03-13 (×5): 1 [IU] via SUBCUTANEOUS

## 2016-03-08 MED ORDER — ACETAMINOPHEN 650 MG RE SUPP
650.0000 mg | Freq: Four times a day (QID) | RECTAL | Status: DC | PRN
  Administered 2016-03-08: 650 mg via RECTAL
  Filled 2016-03-08 (×2): qty 1

## 2016-03-08 MED ORDER — SODIUM CHLORIDE 0.9 % IV BOLUS (SEPSIS): 1000.0000 mL | Freq: Once | INTRAVENOUS | Status: DC

## 2016-03-08 MED ORDER — PROMETHAZINE HCL 25 MG/ML IJ SOLN
25.0000 mg | Freq: Four times a day (QID) | INTRAMUSCULAR | Status: DC | PRN
  Administered 2016-03-09: 25 mg via INTRAVENOUS
  Filled 2016-03-08 (×3): qty 1

## 2016-03-08 NOTE — Progress Notes (Signed)
Pt remains febrile despite PR tylenol given @ 0350. Pt HR increased to 150-160s and sustained, RR in 30-40s, O2 sats in high 80s, pt having rigor.  Pt placed on NRB, 12-lead EKG performed, rapid response RN notified; Dr. Lindie SpruceWyatt notified - orders received for 800 mg ibuprofen PO and lactic acid. Will continue to monitor closely.

## 2016-03-08 NOTE — Progress Notes (Signed)
CRITICAL VALUE ALERT  Critical value received:  Lactic Acid 2.2  Date of notification:  03/08/2016  Time of notification:  0631  Critical value read back:Yes.    Nurse who received alert:  Dianna Rossettiharles Anakin Varkey, RN  MD notified (1st page):  Jimmye NormanJames Wyatt, MD  Time of first page:  0636  MD notified (2nd page): Jimmye NormanJames Wyatt, MD  Time of second page: 0654  Responding MD:  Jimmye NormanJames Wyatt, MD  Time MD responded:  [phone], no additional orders received at this time. Will continue to monitor.

## 2016-03-08 NOTE — Significant Event (Signed)
Rapid Response Event Note Called to see pt for increased HR and increased O2 demands Overview: Time Called: 0500 Arrival Time: 0501 Event Type: Other (Comment)  Initial Focused Assessment: On arrival to the room the pt is sitting upright in bed and having rigors.  HR ST 150s, BP 158/101, RR 32, Sats 95% 5L [place].  Has already had tylenol without improvement in fever.  Dr. Lindie SpruceWyatt updated and orders rcvd for ibuprofen and lactic acid. Interventions:  Ibuprofen 800mg  po Lactic acid Blood cultures  Event Summary: Name of Physician Notified: Dr. Lindie SpruceWyatt at [phone]    at    Outcome: Stayed in room and stabalized     Drew Prince

## 2016-03-08 NOTE — Progress Notes (Signed)
Pharmacy Antibiotic Note  Drew Prince is a 40 y.o. male admitted on 03/07/2016 with peritonitis.  Pt started on Flagyl and Cipro (Day #2) for peritonitis. Pharmacy has been consulted to add Vancomycin.  Plan: Vancomycin 1gm IV q8h Will f/u micro data, renal function, and pt's clinical condition Vanc trough prn   Height: 5\' 4"  (162.6 cm) Weight: 175 lb 4.3 oz (79.5 kg) IBW/kg (Calculated) : 59.2  Temp (24hrs), Avg:101 F (38.3 C), Min:98.9 F (37.2 C), Max:103.3 F (39.6 C)   Recent Labs Lab 03/07/16 0215 03/07/16 0537  WBC 14.2*  --   CREATININE 0.94  --   LATICACIDVEN  --  0.8    Estimated Creatinine Clearance: 99.4 mL/min (by C-G formula based on Cr of 0.94).    Allergies  Allergen Reactions  . Penicillins Rash    .Has patient had a PCN reaction causing immediate rash, facial/tongue/throat swelling, SOB or lightheadedness with hypotension: No Has patient had a PCN reaction causing severe rash involving mucus membranes or skin necrosis: No Has patient had a PCN reaction that required hospitalization No Has patient had a PCN reaction occurring within the last 10 years: No If all of the above answers are "NO", then may proceed with Cephalosporin use.     Antimicrobials this admission: 3/11 Cipro >>  3/11 Flagyl >>  3/12 Vanc>>  Dose adjustments this admission: n/a  Microbiology results: 3/11 BCx:  3/11 MRSA PCR: neg  Thank you for allowing pharmacy to be a part of this patient's care.  Christoper Fabianaron Tiffanee Mcnee, PharmD, BCPS Clinical pharmacist, pager [phone] 03/08/2016 5:46 AM

## 2016-03-08 NOTE — Progress Notes (Signed)
Patient having more abdominal pain with worsening nausea, rigors, fevers up to 103.3 and tachycardia.  Urine output has been good, but only 300cc since midnight. In spite of diffuse abdominal pain and tenderness, the patient has active bowel sounds.  Will repeat plain abdominal X-rays.  BP has been good.  Adding Vanco to antibiotic regimen.  In the differential diagnosis this is either worsening pancreatitis, occult perforated viscus, or severe cholangitis.  He ruled out for a bile leak with the HIDA scan and also the CBD appeared patent.  Labs have been drawn, and will repeat his blood cultures.  Marta LamasJames O. Gae BonWyatt, III, MD, FACS [phone]--pager [phone]--office [facility]

## 2016-03-08 NOTE — Progress Notes (Signed)
Subjective: Pt feels the same   Pain in his RUQ and epigastrium  Plain films normal this am  [place] 2.2 this am which is up  Tachycardia noted   Pain 7/10   Made worse with movement   Objective: Vital signs in last 24 hours: Temp:  [98.4 F (36.9 C)-103.3 F (39.6 C)] 98.4 F (36.9 C) (03/12 0721) Pulse Rate:  [118-157] 150 (03/12 0528) Resp:  [18-40] 26 (03/12 0528) BP: (99-171)/(64-110) 99/64 mmHg (03/12 0721) SpO2:  [92 %-99 %] 94 % (03/12 0528) Weight:  [79.5 kg (175 lb 4.3 oz)] 79.5 kg (175 lb 4.3 oz) (03/11 1643)    Intake/Output from previous day: 03/11 0701 - 03/12 0700 In: 2502.1 [I.V.:1602.1; IV Piggyback:900] Out: 1300 [Urine:1300] Intake/Output this shift: Total I/O In: -  Out: 300 [Urine:300]  GI: port sites clean  tender RUQ and RLQ with rebound and guarding  Not tender LLQ or LUQ  Soft mild distention   CV  Tachycardia Resp  CTA not SOB    Lab Results:   Recent Labs  03/07/16 0215 03/08/16 0527  WBC 14.2* 6.1  HGB 14.5 12.5*  HCT 43.9 38.8*  PLT 275 220   BMET  Recent Labs  03/07/16 0215 03/08/16 0527  NA 141 137  K 3.3* 3.7  CL 98* 101  CO2 28 23  GLUCOSE 133* 120*  BUN 10 6  CREATININE 0.94 0.89  CALCIUM 9.7 8.5*   PT/INR No results for input(s): LABPROT, INR in the last 72 hours. ABG No results for input(s): PHART, HCO3 in the last 72 hours.  Invalid input(s): PCO2, PO2  Studies/Results: Nm Hepatobiliary Including Gb  03/07/2016  CLINICAL DATA:  Status post cholecystectomy, abnormal LFTs, evaluate for bile leak versus CBD stone EXAM: NUCLEAR MEDICINE HEPATOBILIARY IMAGING TECHNIQUE: Sequential images of the abdomen were obtained out to 60 minutes following intravenous administration of radiopharmaceutical. RADIOPHARMACEUTICALS:  5.28 mCi mCi Tc-45m Choletec IV COMPARISON:  CT abdomen pelvis dated 03/07/2016 at 0813 hours FINDINGS: Normal hepatic excretion. Visualization of the proximal small bowel within 15 minutes,  confirming common duct patency. No extravasation of radiotracer to suggest a bile leak. IMPRESSION: No extravasation radiotracer to suggest a bile leak. Patent common duct. Electronically Signed   By: Charline Bills M.D.   On: 03/07/2016 14:52   Ct Abdomen Pelvis W Contrast  03/07/2016  CLINICAL DATA:  Right-sided abdominal pain following cholecystectomy 3 days ago. EXAM: CT ABDOMEN AND PELVIS WITH CONTRAST TECHNIQUE: Multidetector CT imaging of the abdomen and pelvis was performed using the standard protocol following bolus administration of intravenous contrast. CONTRAST:  OMNIPAQUE IOHEXOL 300 MG/ML  SOLN COMPARISON:  Abdominal ultrasound 02/28/2016. CT of the abdomen and pelvis 01/25/2016. FINDINGS: Lower chest: Asymmetric right-sided basilar airspace disease is present. This most likely reflects atelectasis. Early infection is not excluded. The heart size is normal. No significant pleural or pericardial effusions are present. Hepatobiliary: There is mild diffuse fatty infiltration of the liver. Gas inflammatory changes are present within the gallbladder fossa following cholecystectomy. There is no discrete fluid collection. The common bile duct is within normal limits. A small amount of perihepatic fluid is evident. Pancreas: A complex peripancreatic fluid collection is slightly decreased in size since the prior exam. The collection now measures 6.1 x 8.8 x 3.3 cm. The pancreas otherwise enhances normally. Spleen: Within normal limits. Adrenals/Urinary Tract: The adrenal glands are normal bilaterally. The kidneys and ureters are within normal limits. The urinary bladder is unremarkable. Stomach/Bowel: The stomach is within  normal limits. There are secondary inflammatory changes within the second and third portions of the duodenum. The small bowel is unremarkable. The cecum and appendix are within normal limits. There are inflammatory changes about the colon at the hepatic flexure including wall  thickening. The more distal transverse colon is normal. The descending and rectosigmoid colon is within normal limits. Vascular/Lymphatic: No significant vascular lesions are present. Sub cm para-aortic lymph nodes are likely reactive. Reproductive: Within normal limits. Other: A small amount of layering fluid is present in the anatomic pelvis. Musculoskeletal: The bone windows demonstrate a vacuum disc at L5-S1. No other focal lesions are present. IMPRESSION: 1. Status post cholecystectomy. 2. Gas and fluid within they surgical bed is likely within normal limits 3 days following surgery. 3. A small amount of fluid is also present about the liver and within the anatomic pelvis, likely within normal limits. 4. Inflammatory changes are present at the level of the hepatic flexure of the colon. This is likely secondary. No discrete abscess is present. 5. Decreasing size of complex peripancreatic fluid collection compatible with pancreatic pseudocyst. The pancreas enhances throughout. 6. Diffuse fatty infiltration of the liver. Electronically Signed   By: Marin Roberts M.D.   On: 03/07/2016 08:42   Dg Chest Port 1 View  03/08/2016  CLINICAL DATA:  Peritonitis EXAM: PORTABLE CHEST 1 VIEW COMPARISON:  Yesterday FINDINGS: Unchanged low volumes with bibasilar atelectasis. No edema, effusion, or air leak. Normal heart size. IMPRESSION: Persistent hypoventilation and basilar atelectasis. Electronically Signed   By: Marnee Spring M.D.   On: 03/08/2016 06:50   Dg Chest Port 1 View  03/07/2016  CLINICAL DATA:  40 year old male with a history of abdominal pain EXAM: PORTABLE CHEST 1 VIEW COMPARISON:  01/10/2016 FINDINGS: Low lung volumes. Cardiomediastinal silhouette likely unchanged, accentuated by the low lung volumes and positioning. Interstitial markings without confluent airspace disease. Blunting at the left costophrenic angle. No pneumothorax. Interval removal of the right-sided PICC IMPRESSION: Low lung  volumes, with interstitial prominence and blunting at the left costophrenic angle. While these findings are likely secondary to atelectasis and the positioning of this single frontal view, left basilar process not excluded. If there is concern for further evaluation, a dedicated PA and lateral chest x-ray would be useful. Interval removal of right PICC Signed, Yvone Neu. Loreta Ave, DO [facility] Radiology Electronically Signed   By: Gilmer Mor D.O.   On: 03/07/2016 09:25   Dg Abd Portable 2v  03/08/2016  CLINICAL DATA:  Peritonitis EXAM: PORTABLE ABDOMEN - 2 VIEW COMPARISON:  None. FINDINGS: The bowel gas pattern is normal. Oral contrast is seen in nondilated colon. There is no evidence of free air. Bibasilar atelectasis. IMPRESSION: No evidence of pneumoperitoneum or bowel obstruction. Electronically Signed   By: Marnee Spring M.D.   On: 03/08/2016 06:51    Anti-infectives: Anti-infectives    Start     Dose/Rate Route Frequency Ordered Stop   03/08/16 0600  vancomycin (VANCOCIN) IVPB 1000 mg/200 mL premix     1,000 mg 200 mL/hr over 60 Minutes Intravenous Every 8 hours 03/08/16 0550     03/07/16 1130  ciprofloxacin (CIPRO) IVPB 400 mg     400 mg 200 mL/hr over 60 Minutes Intravenous Every 12 hours 03/07/16 1125     03/07/16 1130  metroNIDAZOLE (FLAGYL) IVPB 500 mg     500 mg 100 mL/hr over 60 Minutes Intravenous Every 8 hours 03/07/16 1125     03/07/16 0530  ceFEPIme (MAXIPIME)  1 g in dextrose 5 % 50 mL IVPB     1 g 100 mL/hr over 30 Minutes Intravenous  Once 03/07/16 0516 03/07/16 0700   03/07/16 0530  metroNIDAZOLE (FLAGYL) IVPB 500 mg     500 mg 100 mL/hr over 60 Minutes Intravenous  Once 03/07/16 0516 03/07/16 0612      Assessment/Plan: Patient Active Problem List   Diagnosis Date Noted  . Peritonitis (HCC) 03/07/2016  . Pancreatitis, acute   . Vomiting   . Pancreatic necrosis (HCC)   . Pancreatic pseudocyst   . Acute  pancreatitis 01/05/2016  . Essential hypertension 01/05/2016  . Hyperlipidemia 01/05/2016   4 days post op LAP CHOLE  no IOC Sudden onset of RUQ pain with NL HIDA but signs of sepsis  On broad spectrum ABX now  Recommended laparoscopy possible laparotomy since etiology of his symptoms not clear Discussed continued medical management as well but told him this is not the best option since unclear what his pain is from  This could be reactivation of his pancreatitis but could also be abscess bowel injury etc  He does not want to proceed with laparoscopy or laparotomy at this point    He understands his condition could worsen and death could result   He understands that he may need open surgery and possible open abdomen   Will continue to follow in SDU for now  Continue  NPO for now    LOS: 1 day    Nioma Mccubbins A. 03/08/2016

## 2016-03-08 NOTE — Progress Notes (Addendum)
[facility] Progress Note  Subjective:   HIDA with no bile leak. Continues with c/o pain in RUQ and epigastum. Feels distended.Abd films this morning with no evidence of pneumoperitoneum or bowel obstruction. WBC 6.1, tbili 3.9, alk phos 105, alt 125, ast 68.   Objective:  Vital signs in last 24 hours: Temp:  [98.4 F (36.9 C)-103.3 F (39.6 C)] 98.4 F (36.9 C) (03/12 0721) Pulse Rate:  [118-157] 150 (03/12 0528) Resp:  [18-31] 26 (03/12 0528) BP: (99-171)/(64-110) 99/64 mmHg (03/12 0721) SpO2:  [92 %-99 %] 94 % (03/12 0528) Weight:  [175 lb 4.3 oz (79.5 kg)] 175 lb 4.3 oz (79.5 kg) (03/11 1643)   General:   Alert,  Well-developed,    in NAD Heart:  Regular rate and rhythm; no murmurs Pulm;lungs clear Abdomen:  Soft,  Mildly distended, tender RUQ,epigastrum and RLQ,with guarding and rebound  Extremities:  Without edema. Neurologic: Alert and  oriented x4;  grossly normal neurologically. Psych:  Alert and cooperative. Normal mood and affect.  Intake/Output from previous day:  Lab Results:  Recent Labs  03/07/16 0215 03/08/16 0527  WBC 14.2* 6.1  HGB 14.5 12.5*  HCT 43.9 38.8*  PLT 275 220   BMET  Recent Labs  03/07/16 0215 03/08/16 0527  NA 141 137  K 3.3* 3.7  CL 98* 101  CO2 28 23  GLUCOSE 133* 120*  BUN 10 6  CREATININE 0.94 0.89  CALCIUM 9.7 8.5*   LFT  Recent Labs  03/08/16 0527  PROT 6.1*  ALBUMIN 3.0*  AST 68*  ALT 125*  ALKPHOS 105  BILITOT 3.9*    Nm Hepatobiliary Including Gb  03/07/2016  CLINICAL DATA:  Status post cholecystectomy, abnormal LFTs, evaluate for bile leak versus CBD stone EXAM: NUCLEAR MEDICINE HEPATOBILIARY IMAGING TECHNIQUE: Sequential images of the abdomen were obtained out to 60 minutes following intravenous administration of radiopharmaceutical. RADIOPHARMACEUTICALS:  5.28 mCi mCi Tc-59mCholetec IV COMPARISON:  CT abdomen pelvis dated 03/07/2016 at 0813 hours FINDINGS: Normal hepatic excretion.  Visualization of the proximal small bowel within 15 minutes, confirming common duct patency. No extravasation of radiotracer to suggest a bile leak. IMPRESSION: No extravasation radiotracer to suggest a bile leak. Patent common duct. Electronically Signed   By: SJulian HyM.D.   On: 03/07/2016 14:52   Ct Abdomen Pelvis W Contrast  03/07/2016  CLINICAL DATA:  Right-sided abdominal pain following cholecystectomy 3 days ago. EXAM: CT ABDOMEN AND PELVIS WITH CONTRAST TECHNIQUE: Multidetector CT imaging of the abdomen and pelvis was performed using the standard protocol following bolus administration of intravenous contrast. CONTRAST:  101mOMNIPAQUE IOHEXOL 300 MG/ML  SOLN COMPARISON:  Abdominal ultrasound 02/28/2016. CT of the abdomen and pelvis 01/25/2016. FINDINGS: Lower chest: Asymmetric right-sided basilar airspace disease is present. This most likely reflects atelectasis. Early infection is not excluded. The heart size is normal. No significant pleural or pericardial effusions are present. Hepatobiliary: There is mild diffuse fatty infiltration of the liver. Gas inflammatory changes are present within the gallbladder fossa following cholecystectomy. There is no discrete fluid collection. The common bile duct is within normal limits. A small amount of perihepatic fluid is evident. Pancreas: A complex peripancreatic fluid collection is slightly decreased in size since the prior exam. The collection now measures 6.1 x 8.8 x 3.3 cm. The pancreas otherwise enhances normally. Spleen: Within normal limits. Adrenals/Urinary Tract: The adrenal glands are normal bilaterally. The kidneys and ureters are within normal limits. The urinary bladder is unremarkable. Stomach/Bowel: The  stomach is within normal limits. There are secondary inflammatory changes within the second and third portions of the duodenum. The small bowel is unremarkable. The cecum and appendix are within normal limits. There are inflammatory  changes about the colon at the hepatic flexure including wall thickening. The more distal transverse colon is normal. The descending and rectosigmoid colon is within normal limits. Vascular/Lymphatic: No significant vascular lesions are present. Sub cm para-aortic lymph nodes are likely reactive. Reproductive: Within normal limits. Other: A small amount of layering fluid is present in the anatomic pelvis. Musculoskeletal: The bone windows demonstrate a vacuum disc at L5-S1. No other focal lesions are present. IMPRESSION: 1. Status post cholecystectomy. 2. Gas and fluid within they surgical bed is likely within normal limits 3 days following surgery. 3. A small amount of fluid is also present about the liver and within the anatomic pelvis, likely within normal limits. 4. Inflammatory changes are present at the level of the hepatic flexure of the colon. This is likely secondary. No discrete abscess is present. 5. Decreasing size of complex peripancreatic fluid collection compatible with pancreatic pseudocyst. The pancreas enhances throughout. 6. Diffuse fatty infiltration of the liver. Electronically Signed   By: San Morelle M.D.   On: 03/07/2016 08:42   Dg Chest Port 1 View  03/08/2016  CLINICAL DATA:  Peritonitis EXAM: PORTABLE CHEST 1 VIEW COMPARISON:  Yesterday FINDINGS: Unchanged low volumes with bibasilar atelectasis. No edema, effusion, or air leak. Normal heart size. IMPRESSION: Persistent hypoventilation and basilar atelectasis. Electronically Signed   By: Monte Fantasia M.D.   On: 03/08/2016 06:50   Dg Chest Port 1 View  03/07/2016  CLINICAL DATA:  40 year old male with a history of abdominal pain EXAM: PORTABLE CHEST 1 VIEW COMPARISON:  01/10/2016 FINDINGS: Low lung volumes. Cardiomediastinal silhouette likely unchanged, accentuated by the low lung volumes and positioning. Interstitial markings without confluent airspace disease. Blunting at the left costophrenic angle. No pneumothorax.  Interval removal of the right-sided PICC IMPRESSION: Low lung volumes, with interstitial prominence and blunting at the left costophrenic angle. While these findings are likely secondary to atelectasis and the positioning of this single frontal view, left basilar process not excluded. If there is concern for further evaluation, a dedicated PA and lateral chest x-ray would be useful. Interval removal of right PICC Signed, Dulcy Fanny. Earleen Newport, DO [facility] Radiology Electronically Signed   By: Corrie Mckusick D.O.   On: 03/07/2016 09:25   Dg Abd Portable 2v  03/08/2016  CLINICAL DATA:  Peritonitis EXAM: PORTABLE ABDOMEN - 2 VIEW COMPARISON:  None. FINDINGS: The bowel gas pattern is normal. Oral contrast is seen in nondilated colon. There is no evidence of free air. Bibasilar atelectasis. IMPRESSION: No evidence of pneumoperitoneum or bowel obstruction. Electronically Signed   By: Monte Fantasia M.D.   On: 03/08/2016 06:51    ASSESSMENT/PLAN:  40 yo male with severe abd pain after lap chole. No bile leak per HIDA. WBC improving--on cipro and flagyl. Not sure MRCP would add much--doesn't sound like a stone. Surg has recommended laparoscopy, but pt thus far has declined. Continue supportive care per [facility].    LOS: 1 day   Hvozdovic, Vita Barley PA-C 03/08/2016, Pager [phone] Mon-Fri 8a-5p [phone] after 5p, weekends, holidays    [facility] Attending   I have taken an interval history, reviewed the chart and examined the patient. I agree with the Advanced Practitioner's note, impression and recommendations.   Seems about the same right now  but had more fever and pain overnight early AM - has declined surgery  GI available if needed - will let Dr. Benson Norway know he is here when I sign out.  Gatha Mayer, MD, [facility] [phone] (pager) [phone] after 5 PM, weekends and holidays  03/08/2016 3:28 PM

## 2016-03-08 NOTE — Progress Notes (Signed)
Pt urine output in last 8 hours is less than 18600mls-dark amber color. Notified MD. New orders given.  Will continue to monitor.

## 2016-03-09 ENCOUNTER — Encounter (HOSPITAL_COMMUNITY)
Admission: EM | Disposition: A | Discharge status: Home / Self Care | Payer: Self-pay | Source: Home / Self Care | Attending: General Surgery

## 2016-03-09 ENCOUNTER — Inpatient Hospital Stay (HOSPITAL_COMMUNITY): Payer: BLUE CROSS/BLUE SHIELD | Admitting: Anesthesiology

## 2016-03-09 HISTORY — PX: HEMATOMA EVACUATION: SHX5118

## 2016-03-09 HISTORY — PX: LAPAROSCOPY: SHX197

## 2016-03-09 LAB — CBC
HCT: 34.5 % — ABNORMAL LOW (ref 39.0–52.0)
HEMATOCRIT: 34.2 % — AB (ref 39.0–52.0)
HEMOGLOBIN: 10.9 g/dL — AB (ref 13.0–17.0)
Hemoglobin: 11.2 g/dL — ABNORMAL LOW (ref 13.0–17.0)
MCH: 26 pg (ref 26.0–34.0)
MCH: 26.2 pg (ref 26.0–34.0)
MCHC: 31.6 g/dL (ref 30.0–36.0)
MCHC: 32.7 g/dL (ref 30.0–36.0)
MCV: 82.1 fL (ref 78.0–100.0)
MCV: 80.1 fL (ref 78.0–100.0)
Platelets: 204 10*3/uL (ref 150–400)
Platelets: 204 10*3/uL (ref 150–400)
RBC: 4.2 MIL/uL — AB (ref 4.22–5.81)
RBC: 4.27 MIL/uL (ref 4.22–5.81)
RDW: 14.8 % (ref 11.5–15.5)
RDW: 14.9 % (ref 11.5–15.5)
WBC: 10.4 10*3/uL (ref 4.0–10.5)
WBC: 11.7 10*3/uL — ABNORMAL HIGH (ref 4.0–10.5)

## 2016-03-09 LAB — GLUCOSE, CAPILLARY
GLUCOSE-CAPILLARY: 77 mg/dL (ref 65–99)
GLUCOSE-CAPILLARY: 120 mg/dL — AB (ref 65–99)
GLUCOSE-CAPILLARY: 103 mg/dL — AB (ref 65–99)
GLUCOSE-CAPILLARY: 86 mg/dL (ref 65–99)
GLUCOSE-CAPILLARY: 89 mg/dL (ref 65–99)
Glucose-Capillary: 102 mg/dL — ABNORMAL HIGH (ref 65–99)

## 2016-03-09 LAB — COMPREHENSIVE METABOLIC PANEL
ALK PHOS: 92 U/L (ref 38–126)
ALT: 78 U/L — AB (ref 17–63)
AST: 38 U/L (ref 15–41)
Albumin: 2.6 g/dL — ABNORMAL LOW (ref 3.5–5.0)
Anion gap: 12 (ref 5–15)
BUN: 18 mg/dL (ref 6–20)
CALCIUM: 8.4 mg/dL — AB (ref 8.9–10.3)
CHLORIDE: 104 mmol/L (ref 101–111)
CO2: 22 mmol/L (ref 22–32)
CREATININE: 2.64 mg/dL — AB (ref 0.61–1.24)
GFR, EST AFRICAN AMERICAN: 33 mL/min — AB (ref >60)
GFR, EST NON AFRICAN AMERICAN: 29 mL/min — AB (ref >60)
Glucose, Bld: 98 mg/dL (ref 65–99)
Potassium: 4.2 mmol/L (ref 3.5–5.1)
SODIUM: 138 mmol/L (ref 135–145)
Total Bilirubin: 2.3 mg/dL — ABNORMAL HIGH (ref 0.3–1.2)
Total Protein: 5.7 g/dL — ABNORMAL LOW (ref 6.5–8.1)

## 2016-03-09 LAB — BASIC METABOLIC PANEL
Anion gap: 18 — ABNORMAL HIGH (ref 5–15)
BUN: 23 mg/dL — ABNORMAL HIGH (ref 6–20)
CALCIUM: 7.4 mg/dL — AB (ref 8.9–10.3)
CO2: 12 mmol/L — AB (ref 22–32)
CREATININE: 3 mg/dL — AB (ref 0.61–1.24)
Chloride: 111 mmol/L (ref 101–111)
GFR calc Af Amer: 28 mL/min — ABNORMAL LOW (ref >60)
GFR calc non Af Amer: 25 mL/min — ABNORMAL LOW (ref >60)
GLUCOSE: 114 mg/dL — AB (ref 65–99)
Potassium: 4.3 mmol/L (ref 3.5–5.1)
Sodium: 141 mmol/L (ref 135–145)

## 2016-03-09 LAB — CREATININE, URINE, RANDOM: CREATININE, URINE: 73.39 mg/dL

## 2016-03-09 LAB — HEMOGLOBIN A1C
HEMOGLOBIN A1C: 5.3 % (ref 4.8–5.6)
MEAN PLASMA GLUCOSE: 105 mg/dL

## 2016-03-09 LAB — LIPASE, BLOOD: Lipase: 16 U/L (ref 11–51)

## 2016-03-09 LAB — SODIUM, URINE, RANDOM: SODIUM UR: 122 mmol/L

## 2016-03-09 SURGERY — LAPAROSCOPY, DIAGNOSTIC
Anesthesia: General | Site: Abdomen

## 2016-03-09 MED ORDER — NALOXONE HCL 0.4 MG/ML IJ SOLN: 0.4000 mg | INTRAMUSCULAR | Status: DC | PRN

## 2016-03-09 MED ORDER — PROMETHAZINE HCL 25 MG/ML IJ SOLN: 6.2500 mg | INTRAMUSCULAR | Status: DC | PRN

## 2016-03-09 MED ORDER — FENTANYL CITRATE (PF) 250 MCG/5ML IJ SOLN
INTRAMUSCULAR | Status: AC
  Filled 2016-03-09: qty 5

## 2016-03-09 MED ORDER — HYDROMORPHONE HCL 1 MG/ML IJ SOLN
0.2500 mg | INTRAMUSCULAR | Status: DC | PRN
  Filled 2016-03-09: qty 1

## 2016-03-09 MED ORDER — ONDANSETRON HCL 4 MG/2ML IJ SOLN: 4.0000 mg | Freq: Four times a day (QID) | INTRAMUSCULAR | Status: DC | PRN

## 2016-03-09 MED ORDER — FENTANYL CITRATE (PF) 100 MCG/2ML IJ SOLN
INTRAMUSCULAR | Status: DC | PRN
  Administered 2016-03-09: 25 ug via INTRAVENOUS
  Administered 2016-03-09 (×2): 50 ug via INTRAVENOUS
  Administered 2016-03-09: 100 ug via INTRAVENOUS
  Administered 2016-03-09: 25 ug via INTRAVENOUS
  Administered 2016-03-09 (×2): 50 ug via INTRAVENOUS
  Administered 2016-03-09: 25 ug via INTRAVENOUS

## 2016-03-09 MED ORDER — SODIUM CHLORIDE 0.9% FLUSH: 9.0000 mL | INTRAVENOUS | Status: DC | PRN

## 2016-03-09 MED ORDER — SODIUM CHLORIDE 0.9 % IV SOLN
INTRAVENOUS | Status: DC | PRN
  Administered 2016-03-09 (×4): via INTRAVENOUS

## 2016-03-09 MED ORDER — LIDOCAINE HCL (CARDIAC) 20 MG/ML IV SOLN
INTRAVENOUS | Status: DC | PRN
  Administered 2016-03-09: 100 mg via INTRAVENOUS

## 2016-03-09 MED ORDER — ONDANSETRON HCL 4 MG/2ML IJ SOLN
INTRAMUSCULAR | Status: DC | PRN
  Administered 2016-03-09: 4 mg via INTRAVENOUS

## 2016-03-09 MED ORDER — BUPIVACAINE HCL (PF) 0.25 % IJ SOLN
INTRAMUSCULAR | Status: AC
  Filled 2016-03-09: qty 30

## 2016-03-09 MED ORDER — HYDROMORPHONE HCL 1 MG/ML IJ SOLN
0.2500 mg | INTRAMUSCULAR | Status: DC | PRN
  Administered 2016-03-09 (×2): 0.5 mg via INTRAVENOUS

## 2016-03-09 MED ORDER — 0.9 % SODIUM CHLORIDE (POUR BTL) OPTIME
TOPICAL | Status: DC | PRN
  Administered 2016-03-09 (×2): 1000 mL

## 2016-03-09 MED ORDER — METRONIDAZOLE IN NACL 5-0.79 MG/ML-% IV SOLN
500.0000 mg | INTRAVENOUS | Status: DC
  Filled 2016-03-09: qty 100

## 2016-03-09 MED ORDER — SODIUM CHLORIDE 0.9 % IR SOLN
Status: DC | PRN
Start: 2016-03-09 — End: 2016-03-09
  Administered 2016-03-09: 3000 mL
  Administered 2016-03-09: 1000 mL

## 2016-03-09 MED ORDER — SUGAMMADEX SODIUM 200 MG/2ML IV SOLN
INTRAVENOUS | Status: DC | PRN
  Administered 2016-03-09: 159 mg via INTRAVENOUS

## 2016-03-09 MED ORDER — SODIUM CHLORIDE 0.9 % IV BOLUS (SEPSIS)
1000.0000 mL | Freq: Once | INTRAVENOUS | Status: AC
  Administered 2016-03-09: 1000 mL via INTRAVENOUS

## 2016-03-09 MED ORDER — SUCCINYLCHOLINE CHLORIDE 20 MG/ML IJ SOLN
INTRAMUSCULAR | Status: DC | PRN
  Administered 2016-03-09: 120 mg via INTRAVENOUS

## 2016-03-09 MED ORDER — BUPIVACAINE HCL 0.25 % IJ SOLN
INTRAMUSCULAR | Status: DC | PRN
  Administered 2016-03-09: 3 mL

## 2016-03-09 MED ORDER — SUGAMMADEX SODIUM 200 MG/2ML IV SOLN
INTRAVENOUS | Status: AC
  Filled 2016-03-09: qty 2

## 2016-03-09 MED ORDER — ARTIFICIAL TEARS OP OINT
TOPICAL_OINTMENT | OPHTHALMIC | Status: DC | PRN
  Administered 2016-03-09: 1 via OPHTHALMIC

## 2016-03-09 MED ORDER — HEMOSTATIC AGENTS (NO CHARGE) OPTIME
TOPICAL | Status: DC | PRN
  Administered 2016-03-09 (×3): 1 via TOPICAL

## 2016-03-09 MED ORDER — PROPOFOL 10 MG/ML IV BOLUS
INTRAVENOUS | Status: DC | PRN
  Administered 2016-03-09: 200 mg via INTRAVENOUS

## 2016-03-09 MED ORDER — MIDAZOLAM HCL 2 MG/2ML IJ SOLN
INTRAMUSCULAR | Status: AC
  Filled 2016-03-09: qty 2

## 2016-03-09 MED ORDER — DIPHENHYDRAMINE HCL 50 MG/ML IJ SOLN: 12.5000 mg | Freq: Four times a day (QID) | INTRAMUSCULAR | Status: DC | PRN

## 2016-03-09 MED ORDER — SODIUM CHLORIDE 0.9 % IV SOLN
INTRAVENOUS | Status: DC
  Administered 2016-03-09: 10:00:00 via INTRAVENOUS

## 2016-03-09 MED ORDER — LACTATED RINGERS IV SOLN: INTRAVENOUS | Status: DC

## 2016-03-09 MED ORDER — HYDROMORPHONE HCL 1 MG/ML IJ SOLN
INTRAMUSCULAR | Status: AC
  Filled 2016-03-09: qty 1

## 2016-03-09 MED ORDER — ROCURONIUM BROMIDE 100 MG/10ML IV SOLN
INTRAVENOUS | Status: DC | PRN
  Administered 2016-03-09: 10 mg via INTRAVENOUS
  Administered 2016-03-09: 40 mg via INTRAVENOUS

## 2016-03-09 MED ORDER — MEPERIDINE HCL 25 MG/ML IJ SOLN: 6.2500 mg | INTRAMUSCULAR | Status: DC | PRN

## 2016-03-09 MED ORDER — DIPHENHYDRAMINE HCL 12.5 MG/5ML PO ELIX: 12.5000 mg | ORAL_SOLUTION | Freq: Four times a day (QID) | ORAL | Status: DC | PRN

## 2016-03-09 MED ORDER — MORPHINE SULFATE 2 MG/ML IV SOLN
INTRAVENOUS | Status: DC
  Administered 2016-03-09: 7.5 mg via INTRAVENOUS
  Administered 2016-03-09: 3 mg via INTRAVENOUS
  Administered 2016-03-09: 2 mg via INTRAVENOUS
  Administered 2016-03-10: 10.5 mg via INTRAVENOUS
  Administered 2016-03-10: 32.71 mg via INTRAVENOUS
  Administered 2016-03-10: 7.5 mg via INTRAVENOUS
  Administered 2016-03-10: 2 mg via INTRAVENOUS
  Administered 2016-03-10: 24 mg via INTRAVENOUS
  Administered 2016-03-10: 9 mg via INTRAVENOUS
  Administered 2016-03-11: 4.5 mg via INTRAVENOUS
  Administered 2016-03-11: 1.5 mg via INTRAVENOUS
  Administered 2016-03-11: 6 mg via INTRAVENOUS
  Filled 2016-03-09 (×3): qty 25

## 2016-03-09 SURGICAL SUPPLY — 70 items
ARISTA  AH FLEXITIP XL APPLICATOR ×6 IMPLANT
BLADE SURG ROTATE 9660 (MISCELLANEOUS) IMPLANT
CANISTER SUCTION 2500CC (MISCELLANEOUS) ×3 IMPLANT
CHLORAPREP W/TINT 26ML (MISCELLANEOUS) ×3 IMPLANT
COVER SURGICAL LIGHT HANDLE (MISCELLANEOUS) ×3 IMPLANT
DEVICE TROCAR PUNCTURE CLOSURE (ENDOMECHANICALS) ×3 IMPLANT
DRAIN CHANNEL 19F RND (DRAIN) ×3 IMPLANT
DRAPE LAPAROSCOPIC ABDOMINAL (DRAPES) IMPLANT
DRAPE WARM FLUID 44X44 (DRAPE) ×3 IMPLANT
DRSG OPSITE POSTOP 4X10 (GAUZE/BANDAGES/DRESSINGS) IMPLANT
DRSG OPSITE POSTOP 4X8 (GAUZE/BANDAGES/DRESSINGS) IMPLANT
ELECT BLADE 6.5 EXT (BLADE) IMPLANT
ELECT CAUTERY BLADE 6.4 (BLADE) IMPLANT
ELECT REM PT RETURN 9FT ADLT (ELECTROSURGICAL) ×3
ELECTRODE REM PT RTRN 9FT ADLT (ELECTROSURGICAL) ×2 IMPLANT
EVACUATOR SILICONE 100CC (DRAIN) ×3 IMPLANT
GLOVE BIO SURGEON STRL SZ7 (GLOVE) ×6 IMPLANT
GLOVE BIO SURGEON STRL SZ7.5 (GLOVE) ×3 IMPLANT
GLOVE BIOGEL PI IND STRL 6 (GLOVE) ×2 IMPLANT
GLOVE BIOGEL PI IND STRL 6.5 (GLOVE) ×2 IMPLANT
GLOVE BIOGEL PI IND STRL 7.0 (GLOVE) ×8 IMPLANT
GLOVE BIOGEL PI IND STRL 7.5 (GLOVE) ×2 IMPLANT
GLOVE BIOGEL PI IND STRL 8 (GLOVE) ×2 IMPLANT
GLOVE BIOGEL PI INDICATOR 6 (GLOVE) ×1
GLOVE BIOGEL PI INDICATOR 6.5 (GLOVE) ×1
GLOVE BIOGEL PI INDICATOR 7.0 (GLOVE) ×4
GLOVE BIOGEL PI INDICATOR 7.5 (GLOVE) ×1
GLOVE BIOGEL PI INDICATOR 8 (GLOVE) ×1
GLOVE ECLIPSE 6.5 STRL STRAW (GLOVE) ×3 IMPLANT
GLOVE ECLIPSE 7.5 STRL STRAW (GLOVE) ×3 IMPLANT
GLOVE SURG SS PI 6.5 STRL IVOR (GLOVE) ×6 IMPLANT
GOWN STRL REUS W/ TWL LRG LVL3 (GOWN DISPOSABLE) ×12 IMPLANT
GOWN STRL REUS W/ TWL XL LVL3 (GOWN DISPOSABLE) ×2 IMPLANT
GOWN STRL REUS W/TWL LRG LVL3 (GOWN DISPOSABLE) ×6
GOWN STRL REUS W/TWL XL LVL3 (GOWN DISPOSABLE) ×1
HEMOSTAT ARISTA ABSORB 3G PWDR (MISCELLANEOUS) ×3 IMPLANT
HEMOSTAT SNOW SURGICEL 2X4 (HEMOSTASIS) ×3 IMPLANT
HEMOSTAT SURGICEL 2X4 FIBR (HEMOSTASIS) ×3 IMPLANT
KIT BASIN OR (CUSTOM PROCEDURE TRAY) ×3 IMPLANT
KIT ROOM TURNOVER OR (KITS) ×3 IMPLANT
LIGASURE IMPACT 36 18CM CVD LR (INSTRUMENTS) IMPLANT
LIQUID BAND (GAUZE/BANDAGES/DRESSINGS) IMPLANT
NEEDLE INSUFFLATION 14GA 120MM (NEEDLE) ×3 IMPLANT
NS IRRIG 1000ML POUR BTL (IV SOLUTION) ×6 IMPLANT
PACK GENERAL/GYN (CUSTOM PROCEDURE TRAY) ×3 IMPLANT
PAD ARMBOARD 7.5X6 YLW CONV (MISCELLANEOUS) ×3 IMPLANT
SCISSORS LAP 5X35 DISP (ENDOMECHANICALS) IMPLANT
SET IRRIG TUBING LAPAROSCOPIC (IRRIGATION / IRRIGATOR) ×3 IMPLANT
SLEEVE ENDOPATH XCEL 5M (ENDOMECHANICALS) ×6 IMPLANT
SPECIMEN JAR LARGE (MISCELLANEOUS) IMPLANT
SPONGE LAP 18X18 X RAY DECT (DISPOSABLE) IMPLANT
STAPLER VISISTAT 35W (STAPLE) IMPLANT
SUCTION POOLE TIP (SUCTIONS) IMPLANT
SUT ETHILON 2 0 FS 18 (SUTURE) ×3 IMPLANT
SUT MNCRL AB 4-0 PS2 18 (SUTURE) ×3 IMPLANT
SUT PDS AB 1 TP1 96 (SUTURE) IMPLANT
SUT SILK 2 0 SH CR/8 (SUTURE) IMPLANT
SUT SILK 2 0 TIES 10X30 (SUTURE) IMPLANT
SUT SILK 3 0 SH CR/8 (SUTURE) IMPLANT
SUT SILK 3 0 TIES 10X30 (SUTURE) IMPLANT
TOWEL OR 17X24 6PK STRL BLUE (TOWEL DISPOSABLE) ×3 IMPLANT
TOWEL OR 17X26 10 PK STRL BLUE (TOWEL DISPOSABLE) IMPLANT
TRAY FOLEY CATH 16FRSI W/METER (SET/KITS/TRAYS/PACK) ×3 IMPLANT
TRAY LAPAROSCOPIC MC (CUSTOM PROCEDURE TRAY) ×3 IMPLANT
TROCAR XCEL 12X100 BLDLESS (ENDOMECHANICALS) ×3 IMPLANT
TROCAR XCEL BLUNT TIP 100MML (ENDOMECHANICALS) IMPLANT
TROCAR XCEL NON-BLD 11X100MML (ENDOMECHANICALS) IMPLANT
TROCAR XCEL NON-BLD 5MMX100MML (ENDOMECHANICALS) ×3 IMPLANT
TUBING INSUFFLATION (TUBING) ×3 IMPLANT
YANKAUER SUCT BULB TIP NO VENT (SUCTIONS) IMPLANT

## 2016-03-09 NOTE — Progress Notes (Signed)
Subjective: Pt con't with abdominal pain. Tachy overnight  Objective: Vital signs in last 24 hours: Temp:  [97.4 F (36.3 C)-99.4 F (37.4 C)] 99.4 F (37.4 C) (03/13 0741) Pulse Rate:  [110-133] 128 (03/13 0745) Resp:  [26-33] 30 (03/13 0745) BP: (105-150)/(72-104) 136/104 mmHg (03/13 0745) SpO2:  [93 %-98 %] 97 % (03/13 0745)    Intake/Output from previous day: 03/12 0701 - 03/13 0700 In: 3125 [I.V.:2125; IV Piggyback:1000] Out: 500 [Urine:500] Intake/Output this shift:    General appearance: alert and cooperative Cardio: tachy GI: soft, distended, TTP RUQ/RLQ, no rebound  Lab Results:   Recent Labs  03/08/16 0527 03/09/16 0054  WBC 6.1 10.4  HGB 12.5* 10.9*  HCT 38.8* 34.5*  PLT 220 204   BMET  Recent Labs  03/08/16 0527 03/09/16 0054  NA 137 138  K 3.7 4.2  CL 101 104  CO2 23 22  GLUCOSE 120* 98  BUN 6 18  CREATININE 0.89 2.64*  CALCIUM 8.5* 8.4*   PT/INR No results for input(s): LABPROT, INR in the last 72 hours. ABG No results for input(s): PHART, HCO3 in the last 72 hours.  Invalid input(s): PCO2, PO2  Studies/Results: Nm Hepatobiliary Including Gb  03/07/2016  CLINICAL DATA:  Status post cholecystectomy, abnormal LFTs, evaluate for bile leak versus CBD stone EXAM: NUCLEAR MEDICINE HEPATOBILIARY IMAGING TECHNIQUE: Sequential images of the abdomen were obtained out to 60 minutes following intravenous administration of radiopharmaceutical. RADIOPHARMACEUTICALS:  5.28 mCi mCi Tc-35m Choletec IV COMPARISON:  CT abdomen pelvis dated 03/07/2016 at 0813 hours FINDINGS: Normal hepatic excretion. Visualization of the proximal small bowel within 15 minutes, confirming common duct patency. No extravasation of radiotracer to suggest a bile leak. IMPRESSION: No extravasation radiotracer to suggest a bile leak. Patent common duct. Electronically Signed   By: Charline Bills M.D.   On: 03/07/2016 14:52   Ct Abdomen Pelvis W Contrast  03/07/2016  CLINICAL  DATA:  Right-sided abdominal pain following cholecystectomy 3 days ago. EXAM: CT ABDOMEN AND PELVIS WITH CONTRAST TECHNIQUE: Multidetector CT imaging of the abdomen and pelvis was performed using the standard protocol following bolus administration of intravenous contrast. CONTRAST:  OMNIPAQUE IOHEXOL 300 MG/ML  SOLN COMPARISON:  Abdominal ultrasound 02/28/2016. CT of the abdomen and pelvis 01/25/2016. FINDINGS: Lower chest: Asymmetric right-sided basilar airspace disease is present. This most likely reflects atelectasis. Early infection is not excluded. The heart size is normal. No significant pleural or pericardial effusions are present. Hepatobiliary: There is mild diffuse fatty infiltration of the liver. Gas inflammatory changes are present within the gallbladder fossa following cholecystectomy. There is no discrete fluid collection. The common bile duct is within normal limits. A small amount of perihepatic fluid is evident. Pancreas: A complex peripancreatic fluid collection is slightly decreased in size since the prior exam. The collection now measures 6.1 x 8.8 x 3.3 cm. The pancreas otherwise enhances normally. Spleen: Within normal limits. Adrenals/Urinary Tract: The adrenal glands are normal bilaterally. The kidneys and ureters are within normal limits. The urinary bladder is unremarkable. Stomach/Bowel: The stomach is within normal limits. There are secondary inflammatory changes within the second and third portions of the duodenum. The small bowel is unremarkable. The cecum and appendix are within normal limits. There are inflammatory changes about the colon at the hepatic flexure including wall thickening. The more distal transverse colon is normal. The descending and rectosigmoid colon is within normal limits. Vascular/Lymphatic: No significant vascular lesions are present. Sub cm para-aortic lymph nodes are likely reactive. Reproductive: Within  normal limits. Other: A small amount of layering  fluid is present in the anatomic pelvis. Musculoskeletal: The bone windows demonstrate a vacuum disc at L5-S1. No other focal lesions are present. IMPRESSION: 1. Status post cholecystectomy. 2. Gas and fluid within they surgical bed is likely within normal limits 3 days following surgery. 3. A small amount of fluid is also present about the liver and within the anatomic pelvis, likely within normal limits. 4. Inflammatory changes are present at the level of the hepatic flexure of the colon. This is likely secondary. No discrete abscess is present. 5. Decreasing size of complex peripancreatic fluid collection compatible with pancreatic pseudocyst. The pancreas enhances throughout. 6. Diffuse fatty infiltration of the liver. Electronically Signed   By: Marin Roberts M.D.   On: 03/07/2016 08:42   Dg Chest Port 1 View  03/08/2016  CLINICAL DATA:  Peritonitis EXAM: PORTABLE CHEST 1 VIEW COMPARISON:  Yesterday FINDINGS: Unchanged low volumes with bibasilar atelectasis. No edema, effusion, or air leak. Normal heart size. IMPRESSION: Persistent hypoventilation and basilar atelectasis. Electronically Signed   By: Marnee Spring M.D.   On: 03/08/2016 06:50   Dg Chest Port 1 View  03/07/2016  CLINICAL DATA:  40 year old male with a history of abdominal pain EXAM: PORTABLE CHEST 1 VIEW COMPARISON:  01/10/2016 FINDINGS: Low lung volumes. Cardiomediastinal silhouette likely unchanged, accentuated by the low lung volumes and positioning. Interstitial markings without confluent airspace disease. Blunting at the left costophrenic angle. No pneumothorax. Interval removal of the right-sided PICC IMPRESSION: Low lung volumes, with interstitial prominence and blunting at the left costophrenic angle. While these findings are likely secondary to atelectasis and the positioning of this single frontal view, left basilar process not excluded. If there is concern for further evaluation, a dedicated PA and lateral chest x-ray  would be useful. Interval removal of right PICC Signed, Yvone Neu. Loreta Ave, DO [facility] Radiology Electronically Signed   By: Gilmer Mor D.O.   On: 03/07/2016 09:25   Dg Abd Portable 2v  03/08/2016  CLINICAL DATA:  Peritonitis EXAM: PORTABLE ABDOMEN - 2 VIEW COMPARISON:  None. FINDINGS: The bowel gas pattern is normal. Oral contrast is seen in nondilated colon. There is no evidence of free air. Bibasilar atelectasis. IMPRESSION: No evidence of pneumoperitoneum or bowel obstruction. Electronically Signed   By: Marnee Spring M.D.   On: 03/08/2016 06:51    Anti-infectives: Anti-infectives    Start     Dose/Rate Route Frequency Ordered Stop   03/08/16 0600  vancomycin (VANCOCIN) IVPB 1000 mg/200 mL premix     1,000 mg 200 mL/hr over 60 Minutes Intravenous Every 8 hours 03/08/16 0550     03/07/16 1130  ciprofloxacin (CIPRO) IVPB 400 mg     400 mg 200 mL/hr over 60 Minutes Intravenous Every 12 hours 03/07/16 1125     03/07/16 1130  metroNIDAZOLE (FLAGYL) IVPB 500 mg     500 mg 100 mL/hr over 60 Minutes Intravenous Every 8 hours 03/07/16 1125     03/07/16 0530  ceFEPIme (MAXIPIME) 1 g in dextrose 5 % 50 mL IVPB     1 g 100 mL/hr over 30 Minutes Intravenous  Once 03/07/16 0516 03/07/16 0700   03/07/16 0530  metroNIDAZOLE (FLAGYL) IVPB 500 mg     500 mg 100 mL/hr over 60 Minutes Intravenous  Once 03/07/16 0516 03/07/16 0612      Assessment/Plan: 13 M y/o s/p  Lap chole POD 4 1. IVF bolus 2. Recheck lipase  3. Plan on Dx lap possible ex lap if lipase normal   LOS: 2 days    Marigene Ehlersamirez Jr., [facility] 03/09/2016

## 2016-03-09 NOTE — Anesthesia Preprocedure Evaluation (Addendum)
Anesthesia Evaluation  Patient identified by MRN, date of birth, ID band Patient awake    Reviewed: Allergy & Precautions, NPO status , Patient's Chart, lab work & pertinent test results  Airway Mallampati: II  TM Distance: >3 FB Neck ROM: Full    Dental no notable dental hx. (+) Teeth Intact, Dental Advisory Given   Pulmonary neg pulmonary ROS,    Pulmonary exam normal breath sounds clear to auscultation       Cardiovascular hypertension, negative cardio ROS Normal cardiovascular exam Rhythm:Regular Rate:Normal     Neuro/Psych negative neurological ROS  negative psych ROS   GI/Hepatic negative GI ROS, Neg liver ROS,   Endo/Other  negative endocrine ROS  Renal/GU negative Renal ROS  negative genitourinary   Musculoskeletal negative musculoskeletal ROS (+)   Abdominal   Peds negative pediatric ROS (+)  Hematology negative hematology ROS (+)   Anesthesia Other Findings Patient with abdominal pain, tachycardia and tachypnea.  Reproductive/Obstetrics negative OB ROS                           Anesthesia Physical Anesthesia Plan  ASA: II and emergent  Anesthesia Plan: General   Post-op Pain Management:    Induction: Intravenous, Rapid sequence and Cricoid pressure planned  Airway Management Planned: Oral ETT  Additional Equipment:   Intra-op Plan:   Post-operative Plan: Extubation in OR  Informed Consent: I have reviewed the patients History and Physical, chart, labs and discussed the procedure including the risks, benefits and alternatives for the proposed anesthesia with the patient or authorized representative who has indicated his/her understanding and acceptance.   Dental advisory given  Plan Discussed with: CRNA  Anesthesia Plan Comments:         Anesthesia Quick Evaluation

## 2016-03-09 NOTE — Progress Notes (Signed)
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  He did not follow up in the office.

## 2016-03-09 NOTE — Transfer of Care (Signed)
Immediate Anesthesia Transfer of Care Note  Patient: Drew Prince  Procedure(s) Performed: Procedure(s): LAPAROSCOPY DIAGNOSTIC (N/A) LAPAROSCOPIC EVACUATION OF HEMATOMA  Patient Location: PACU  Anesthesia Type:General  Level of Consciousness: awake and alert   Airway & Oxygen Therapy: Patient Spontanous Breathing, Patient connected to nasal cannula oxygen and Patient connected to face mask oxygen  Post-op Assessment: Report given to RN, Post -op Vital signs reviewed and stable and Patient moving all extremities  Post vital signs: Reviewed and stable  Last Vitals:  Filed Vitals:   03/09/16 0741 03/09/16 0745  BP:  136/104  Pulse:  128  Temp: 37.4 C   Resp:  30    Complications: No apparent anesthesia complications

## 2016-03-09 NOTE — Anesthesia Procedure Notes (Signed)
Procedure Name: Intubation Date/Time: 03/09/2016 11:19 AM Performed by: Edmonia CaprioAUSTON, Doranne Schmutz M Pre-anesthesia Checklist: Patient identified, Emergency Drugs available, Suction available and Patient being monitored Patient Re-evaluated:Patient Re-evaluated prior to inductionOxygen Delivery Method: Circle system utilized Preoxygenation: Pre-oxygenation with 100% oxygen Intubation Type: IV induction, Rapid sequence and Cricoid Pressure applied Laryngoscope Size: Miller and 2 Grade View: Grade I Tube type: Oral Tube size: 7.5 mm Number of attempts: 1 Airway Equipment and Method: Stylet Placement Confirmation: ETT inserted through vocal cords under direct vision,  breath sounds checked- equal and bilateral and positive ETCO2 Secured at: 20 cm Tube secured with: Tape Dental Injury: Teeth and Oropharynx as per pre-operative assessment

## 2016-03-09 NOTE — Care Management Note (Signed)
Case Management Note  Patient Details  Name: Drew Prince MRN: [id number] Date of Birth: [birth year]  Subjective/Objective:    Patient is from home with spouse, conts with abd pain after lap chole, per HIDA no leak, conts on iv abx.  For evacuation of intra abdominal hematoma and washout today. NCM will continue to follow for dc needs.               Action/Plan:   Expected Discharge Date:                  Expected Discharge Plan:  Home w Home Health Services  In-House Referral:     Discharge planning Services  CM Consult  Post Acute Care Choice:    Choice offered to:     DME Arranged:    DME Agency:     HH Arranged:    HH Agency:     Status of Service:  In process, will continue to follow  Medicare Important Message Given:    Date Medicare IM Given:    Medicare IM give by:    Date Additional Medicare IM Given:    Additional Medicare Important Message give by:     If discussed at Long Length of Stay Meetings, dates discussed:    Additional Comments:  Leone Havenaylor, Kalenna Millett Clinton, RN 03/09/2016, 1:18 PM

## 2016-03-09 NOTE — Op Note (Signed)
03/09/2016  12:35 PM  PATIENT:  Drew Prince  40 y.o. male  PRE-OPERATIVE DIAGNOSIS:  Possible bowel injury  POST-OPERATIVE DIAGNOSIS:  INTRA-ABDOMINAL HEMATOMA  PROCEDURE:  Procedure(s): LAPAROSCOPY DIAGNOSTIC (N/A) LAPAROSCOPIC EVACUATION OF HEMATOMA AND WASHOUT  SURGEON:  Surgeon(s) and Role:    * Axel Filler, MD - Primary  ANESTHESIA:   local and general  EBL: <5cc, intraabdominal hematoma 300cc Total I/O In: 3000 [I.V.:2000; IV Piggyback:1000] Out: 145 [Urine:145]  BLOOD ADMINISTERED:none  DRAINS: (19Fr) Jackson-Pratt drain(s) with closed bulb suction in the subhepatic fossa   LOCAL MEDICATIONS USED:  BUPIVICAINE   SPECIMEN:  No Specimen  DISPOSITION OF SPECIMEN:  N/A  COUNTS:  YES  TOURNIQUET:  * No tourniquets in log *  DICTATION: .Dragon Dictation Indications for procedure: Patient is a 39 year old male status post laparoscopic cholecystectomy, postoperative day #4. Patient was admitted secondary to peritonitis postoperatively. Patient workup for bile leak which appeared to be negative HIDA scan. Patient continue with peritonitis signs, tachycardia, and thus patient was taken back to the operative for diagnostic laparoscopy.  Details of procedure: After the patient was consented he was taken back to the operating room and placed in the supine position with bilateral SCDs in place. He was placed under Gen. endotracheal anesthesia. Foley catheter was placed. Patient was prepped and draped in the usual sterile fashion. A timeout was called all facts were verified.  A Veress needle technique was used to insufflate the abdomen to 15 mmHg at Palmer's point. Subsequent to this a 5 mm trocar was placed intra-abdominally. There was no injury to any intra-abdominal organs after a 5 mm camera was placed in the abdomen visualized. There is large amount of hematoma that could be visualized throughout the abdomen. The omentum was stuck up to the right upper quadrant. A  this time a 5 mm trocar was placed just inferior the umbilicus the previous incision site. The omentum was mobilized off the abdominal wall to the right upper quadrant. A subsequent 5 mm trocar was placed in the right upper quadrant, and the same place as a previous pulmonary trocar. The omentum was taken down. The gallbladder fossa was then visualized a large amount of clot occupying the gallbladder fossa as well as posterior into the right subhepatic fossa. The area was irrigated out and washed out as well as a hematoma evacuated. A pulmonary trocar was used to upsize the left upper quadrant trocar. A 10 mm suction evacuator was then used to evacuate the hematoma throughout the abdominal cavity. The pelvis was irrigated out to the foot was clear. I proceeded to visualize the right upper quadrant gallbladder fossa. There appeared to be no active bleeding. A few spots of some oozing were cauterized. There appeared to be no bile leak. There appeared to be no stool contamination or bile coming from the duodenum. At this time Arista coagulation agent was then placed in the gallbladder fossa. There appeared to be no bleeding, bile drainage in the gallbladder fossa. This Surgicel snow was in placed into the gallbladder fossa.   At this time a 4 [place] JP drain was placed in the right subhepatic fossa and removed via the right upper quadrant 5 mm port site. There was secured to skin using a 2-0 nylon times one. At this time the pulmonary trocar was removed. The fascia was reapproximated using an Endo Close and a 0 Vicryl in interrupted fashion 1. Insufflation was evacuated all trochars were removed.  At this time the skin was reapproximated using 4-0  Monocryl in a subcuticular fashion. The skin was dressed with gauze and tape. The patient was awakened from general anesthesia and taken to the recovery room in stable condition.  PLAN OF CARE: Admit to inpatient   PATIENT DISPOSITION:  PACU - hemodynamically  stable.   Delay start of Pharmacological VTE agent (>24hrs) due to surgical blood loss or risk of bleeding: yes

## 2016-03-09 NOTE — Anesthesia Postprocedure Evaluation (Signed)
Anesthesia Post Note  Patient: Drew Prince  Procedure(s) Performed: Procedure(s) (LRB): LAPAROSCOPY DIAGNOSTIC (N/A) LAPAROSCOPIC EVACUATION OF HEMATOMA  Patient location during evaluation: PACU Anesthesia Type: General Level of consciousness: awake and alert Pain management: pain level controlled Vital Signs Assessment: post-procedure vital signs reviewed and stable Respiratory status: spontaneous breathing, nonlabored ventilation, respiratory function stable and patient connected to nasal cannula oxygen Cardiovascular status: blood pressure returned to baseline and stable Postop Assessment: no signs of nausea or vomiting Anesthetic complications: no    Last Vitals:  Filed Vitals:   03/09/16 1445 03/09/16 1450  BP:  148/92  Pulse: 132 133  Temp:    Resp: 43 33    Last Pain:  Filed Vitals:   03/09/16 1458  PainSc: 10-Worst pain ever                 Phillips Groutarignan, Sairah Knobloch

## 2016-03-10 ENCOUNTER — Encounter (HOSPITAL_COMMUNITY): Payer: Self-pay | Admitting: General Surgery

## 2016-03-10 LAB — CBC
HEMATOCRIT: 31.7 % — AB (ref 39.0–52.0)
Hemoglobin: 10.3 g/dL — ABNORMAL LOW (ref 13.0–17.0)
MCH: 25.9 pg — ABNORMAL LOW (ref 26.0–34.0)
MCHC: 32.5 g/dL (ref 30.0–36.0)
MCV: 79.6 fL (ref 78.0–100.0)
Platelets: 219 10*3/uL (ref 150–400)
RBC: 3.98 MIL/uL — ABNORMAL LOW (ref 4.22–5.81)
RDW: 14.8 % (ref 11.5–15.5)
WBC: 10.2 10*3/uL (ref 4.0–10.5)

## 2016-03-10 LAB — GLUCOSE, CAPILLARY
GLUCOSE-CAPILLARY: 88 mg/dL (ref 65–99)
GLUCOSE-CAPILLARY: 128 mg/dL — AB (ref 65–99)
GLUCOSE-CAPILLARY: 104 mg/dL — AB (ref 65–99)
GLUCOSE-CAPILLARY: 122 mg/dL — AB (ref 65–99)
Glucose-Capillary: 105 mg/dL — ABNORMAL HIGH (ref 65–99)
Glucose-Capillary: 125 mg/dL — ABNORMAL HIGH (ref 65–99)
Glucose-Capillary: 105 mg/dL — ABNORMAL HIGH (ref 65–99)

## 2016-03-10 LAB — BASIC METABOLIC PANEL
ANION GAP: 12 (ref 5–15)
BUN: 29 mg/dL — ABNORMAL HIGH (ref 6–20)
CHLORIDE: 110 mmol/L (ref 101–111)
CO2: 17 mmol/L — AB (ref 22–32)
CREATININE: 3.39 mg/dL — AB (ref 0.61–1.24)
Calcium: 7.7 mg/dL — ABNORMAL LOW (ref 8.9–10.3)
GFR calc non Af Amer: 21 mL/min — ABNORMAL LOW (ref >60)
GFR, EST AFRICAN AMERICAN: 25 mL/min — AB (ref >60)
Glucose, Bld: 98 mg/dL (ref 65–99)
POTASSIUM: 3.7 mmol/L (ref 3.5–5.1)
SODIUM: 139 mmol/L (ref 135–145)

## 2016-03-10 LAB — LACTATE DEHYDROGENASE: LDH: 231 U/L — AB (ref 98–192)

## 2016-03-10 MED ORDER — METRONIDAZOLE IN NACL 5-0.79 MG/ML-% IV SOLN
500.0000 mg | Freq: Three times a day (TID) | INTRAVENOUS | Status: DC
  Administered 2016-03-10 – 2016-03-13 (×8): 500 mg via INTRAVENOUS
  Filled 2016-03-10 (×10): qty 100

## 2016-03-10 MED ORDER — DEXTROSE-NACL 5-0.9 % IV SOLN
INTRAVENOUS | Status: DC
  Administered 2016-03-10 (×2): via INTRAVENOUS

## 2016-03-10 MED ORDER — CIPROFLOXACIN IN D5W 400 MG/200ML IV SOLN
400.0000 mg | INTRAVENOUS | Status: DC
  Administered 2016-03-11 – 2016-03-12 (×2): 400 mg via INTRAVENOUS
  Filled 2016-03-10 (×3): qty 200

## 2016-03-10 NOTE — Progress Notes (Signed)
Nutrition Brief Note  Patient identified on the Malnutrition Screening Tool (MST) Report. Patient reports 20 lb weight loss during January hospitalization, however this is not supported by chart review. Weight has actually increased since January. Nutrition focused physical exam completed.  No muscle or subcutaneous fat depletion noticed.  Wt Readings from Last 15 Encounters:  03/07/16 175 lb 4.3 oz (79.5 kg)  03/04/16 168 lb 9 oz (76.459 kg)  02/24/16 168 lb 9.6 oz (76.476 kg)  02/21/16 167 lb (75.751 kg)  02/10/16 166 lb 4.8 oz (75.433 kg)  01/20/16 167 lb 15.9 oz (76.2 kg)    Body mass index is 30.07 kg/(m^2). Patient meets criteria for obesity based on current BMI.   Current diet order is clear liquids. Labs and medications reviewed.   No nutrition interventions warranted at this time. Will monitor for diet advancement. If nutrition issues arise, please consult RD.   Joaquin CourtsKimberly Faraaz Wolin, RD, LDN, CNSC Pager [phone] After Hours Pager [phone]

## 2016-03-10 NOTE — Progress Notes (Signed)
1 Day Post-Op  Subjective: Pt feeling better this AM Less tachycardic overnight  Objective: Vital signs in last 24 hours: Temp:  [97.2 F (36.2 C)-101 F (38.3 C)] 98.6 F (37 C) (03/14 0744) Pulse Rate:  [105-157] 107 (03/14 0800) Resp:  [21-57] 27 (03/14 0800) BP: (131-175)/(86-112) 142/112 mmHg (03/14 0800) SpO2:  [94 %-100 %] 100 % (03/14 0800)    Intake/Output from previous day: 03/13 0701 - 03/14 0700 In: 6900 [I.V.:5500; IV Piggyback:1400] Out: 1637 [Urine:1420; Drains:192; Blood:25] Intake/Output this shift: Total I/O In: -  Out: 80 [Drains:80]  General appearance: alert and cooperative Cardio: tachycardic GI: soft approp ttp, ND  Lab Results:   Recent Labs  03/09/16 1314 03/10/16 0348  WBC 11.7* 10.2  HGB 11.2* 10.3*  HCT 34.2* 31.7*  PLT 204 219   BMET  Recent Labs  03/09/16 1314 03/10/16 0348  NA 141 139  K 4.3 3.7  CL 111 110  CO2 12* 17*  GLUCOSE 114* 98  BUN 23* 29*  CREATININE 3.00* 3.39*  CALCIUM 7.4* 7.7*    Anti-infectives: Anti-infectives    Start     Dose/Rate Route Frequency Ordered Stop   03/09/16 1200  metroNIDAZOLE (FLAGYL) IVPB 500 mg  Status:  Discontinued     500 mg 100 mL/hr over 60 Minutes Intravenous To Surgery 03/09/16 1145 03/09/16 1543   03/08/16 0600  vancomycin (VANCOCIN) IVPB 1000 mg/200 mL premix  Status:  Discontinued     1,000 mg 200 mL/hr over 60 Minutes Intravenous Every 8 hours 03/08/16 0550 03/09/16 1548   03/07/16 1130  ciprofloxacin (CIPRO) IVPB 400 mg     400 mg 200 mL/hr over 60 Minutes Intravenous Every 12 hours 03/07/16 1125     03/07/16 1130  metroNIDAZOLE (FLAGYL) IVPB 500 mg     500 mg 100 mL/hr over 60 Minutes Intravenous Every 8 hours 03/07/16 1125     03/07/16 0530  ceFEPIme (MAXIPIME) 1 g in dextrose 5 % 50 mL IVPB     1 g 100 mL/hr over 30 Minutes Intravenous  Once 03/07/16 0516 03/07/16 0700   03/07/16 0530  metroNIDAZOLE (FLAGYL) IVPB 500 mg     500 mg 100 mL/hr over 60 Minutes  Intravenous  Once 03/07/16 0516 03/07/16 0612      Assessment/Plan: s/p Procedure(s): LAPAROSCOPY DIAGNOSTIC (N/A) LAPAROSCOPIC EVACUATION OF HEMATOMA Pt doing better this AM, less pain.  S/w [facility] Marisue [facility]Sanford [facility] increasing Cr Decrease IVF Con't abx Mobilize Pulm toilet  LOS: 3 days    Drew EhlersRamirez Jr., [facility] 03/10/2016

## 2016-03-10 NOTE — Consult Note (Signed)
Drew Prince Admit Date: 03/07/2016 03/10/2016 Drew Prince Requesting Physician:  Nancie Neas MD  Reason for Consult:  AKI HPI:  71M seen at the request of Dr. Derrell Lolling for evaluation of elevated serum creatinine. Patient with normal baseline creatinine and minimal past medical history. Was admitted to [facility] for a prolonged stay in January of this year for acute pancreatitis with pseudocyst formation. Pancreatitis was driven by gallstones. He underwent laparoscopic cholecystectomy on 02/28/16, uneventful. He re-presented to the emergency room with fever, chills, diffuse, severe abdominal pain. He underwent exploratory laparoscopy demonstrating peritonitis.  He has developed acute renal failure. He received CT with IV contrast on 03/07/16. He was taking nonsteroidals at home before admission and received another dose on 3/12. He has made excellent urine output. Potassium is stable. He has a Foley catheter. The CT performed upon presentation of the abdomen demonstrated normal-appearing kidneys.   CREATININE, SER (mg/dL)  Date Value  40/98/1191 3.39*  03/09/2016 3.00*  03/09/2016 2.64*  03/08/2016 0.89  03/07/2016 0.94  02/21/2016 0.77  02/02/2016 0.79  01/31/2016 0.80  01/30/2016 0.81  01/29/2016 0.75  ] I/Os: I/O last 3 completed shifts: In: 57 [I.V.:6125; IV Piggyback:2100] Out: 1837 [Urine:1620; Drains:192; Blood:25]  ROS NSAIDS: on med list from home, rec 3/12 IV Contrast: rec 03/07/16 TMP/SMX No exposure Hypotension no exposure Balance of 12 systems is negative w/ exceptions as above  PMH  Past Medical History  Diagnosis Date  . Hypercholesteremia   . Hypertension     01-05-16 to 02-03-16 Hospitalized for pancreatitis, B/P med discontinued during that time.  . H/O seasonal allergies     OTC med used  . Depression   . GERD (gastroesophageal reflux disease)   . Acute pancreatitis     1-8-17to 02-03-16 Hospital stay at [facility].   PSH  Past Surgical History   Procedure Laterality Date  . Wisdom tooth extraction      "local only"  . Cholecystectomy N/A 03/04/2016    Procedure: LAPAROSCOPIC CHOLECYSTECTOMY ;  Surgeon: Axel Filler, MD;  Location: [facility] ORS;  Service: General;  Laterality: N/A;   FH  Family History  Problem Relation Age of Onset  . Hypertension Father    SH  reports that he has never smoked. He does not have any smokeless tobacco history on file. He reports that he drinks alcohol. He reports that he does not use illicit drugs. Allergies  Allergies  Allergen Reactions  . Penicillins Rash    .Has patient had a PCN reaction causing immediate rash, facial/tongue/throat swelling, SOB or lightheadedness with hypotension: No Has patient had a PCN reaction causing severe rash involving mucus membranes or skin necrosis: No Has patient had a PCN reaction that required hospitalization No Has patient had a PCN reaction occurring within the last 10 years: No If all of the above answers are "NO", then may proceed with Cephalosporin use.    Home medications Prior to Admission medications   Medication Sig Start Date End Date Taking? Authorizing Provider  buPROPion (WELLBUTRIN XL) 150 MG 24 hr tablet Take 150 mg by mouth daily.   Yes Historical Provider, MD  ibuprofen (ADVIL,MOTRIN) 200 MG tablet Take 200 mg by mouth every 6 (six) hours as needed for mild pain.   Yes Historical Provider, MD  ondansetron (ZOFRAN-ODT) 4 MG disintegrating tablet Take 1-2 tablets (4-8 mg total) by mouth every 8 (eight) hours as needed for nausea or vomiting. 02/10/16  Yes Ejiroghene E Emokpae, MD  oxyCODONE (OXY IR/ROXICODONE) 5 MG immediate release tablet  Take 1-2 tablets (5-10 mg total) by mouth every 8 (eight) hours as needed for severe pain. 02/24/16  Yes Courtney ParisEden W Jones, MD  oxyCODONE-acetaminophen (ROXICET) 5-325 MG tablet Take 1-2 tablets by mouth every 4 (four) hours as needed. 03/04/16  Yes Axel FillerArmando Ramirez, MD  pantoprazole (PROTONIX) 40 MG tablet Take 1 tablet  (40 mg total) by mouth daily. 02/03/16  Yes Selina CooleyKyle Flores, MD    Current Medications Scheduled Meds: . ciprofloxacin  400 mg Intravenous Q12H   And  . metronidazole  500 mg Intravenous Q8H  . insulin aspart  0-9 Units Subcutaneous 6 times per day  . morphine   Intravenous 6 times per day  . pantoprazole (PROTONIX) IV  40 mg Intravenous QHS  . sodium chloride  1,000 mL Intravenous Once   Continuous Infusions: . dextrose 5 % and 0.9% NaCl 100 mL/hr at 03/10/16 1112   PRN Meds:.acetaminophen, diphenhydrAMINE **OR** diphenhydrAMINE, HYDROmorphone (DILAUDID) injection, HYDROmorphone (DILAUDID) injection, naloxone **AND** sodium chloride flush, ondansetron **OR** ondansetron (ZOFRAN) IV, ondansetron (ZOFRAN) IV, promethazine  CBC  Recent Labs Lab 03/09/16 0054 03/09/16 1314 03/10/16 0348  WBC 10.4 11.7* 10.2  HGB 10.9* 11.2* 10.3*  HCT 34.5* 34.2* 31.7*  MCV 82.1 80.1 79.6  PLT 204 204 219   Basic Metabolic Panel  Recent Labs Lab 03/07/16 0215 03/08/16 0527 03/09/16 0054 03/09/16 1314 03/10/16 0348  NA 141 137 138 141 139  K 3.3* 3.7 4.2 4.3 3.7  CL 98* 101 104 111 110  CO2 28 23 22  12* 17*  GLUCOSE 133* 120* 98 114* 98  BUN 10 6 18  23* 29*  CREATININE 0.94 0.89 2.64* 3.00* 3.39*  CALCIUM 9.7 8.5* 8.4* 7.4* 7.7*    Physical Exam  Blood pressure 142/112, pulse 107, temperature 98.6 F (37 C), temperature source Oral, resp. rate 27, height 5\' 4"  (1.626 m), weight 79.5 kg (175 lb 4.3 oz), SpO2 100 %. GEN: NAD, lying in bed ENT: NCAT EYES: EOMI, glasses on CV: RRR no mgr PULM: CTAB, nl wob ABD: bandaged c/d/i, mild ttp, not firm SKIN: no rasshes/lesios EXT:no LEE NEURO: Nonfocal   Assessment 50M with AKI (nl BL SCr) 2/2 CIN and NSAIDs on 3/11 and 3/12 respectively  1. Nonoliguric AKI 2/2 CIN and NSAID use 2. Peritonitis s/p Cholecystectomy 3/8 3. Abd pain 4. Hx/o pancreatitis with pseudocyst 12/2015 5. HTN  Plan 1. Expect will recover GFR with time 2. Avoid  other nephrotoxic exposures 3. Might need to start NaHCO3 if acidosis persists 4. Cont hydration until tolerating stable diet Daily weights, Daily Renal Panel, Strict I/Os, Avoid nephrotoxins (NSAIDs, judicious IV Contrast)  Will follow along   Sabra Heckyan Annslee Tercero MD [phone] pgr 03/10/2016, 11:40 AM

## 2016-03-11 LAB — CBC
HCT: 35.8 % — ABNORMAL LOW (ref 39.0–52.0)
HEMOGLOBIN: 11.8 g/dL — AB (ref 13.0–17.0)
MCH: 25.9 pg — ABNORMAL LOW (ref 26.0–34.0)
MCHC: 33 g/dL (ref 30.0–36.0)
MCV: 78.5 fL (ref 78.0–100.0)
Platelets: 253 10*3/uL (ref 150–400)
RBC: 4.56 MIL/uL (ref 4.22–5.81)
RDW: 14.3 % (ref 11.5–15.5)
WBC: 11.2 10*3/uL — ABNORMAL HIGH (ref 4.0–10.5)

## 2016-03-11 LAB — GLUCOSE, CAPILLARY
GLUCOSE-CAPILLARY: 94 mg/dL (ref 65–99)
GLUCOSE-CAPILLARY: 107 mg/dL — AB (ref 65–99)
GLUCOSE-CAPILLARY: 103 mg/dL — AB (ref 65–99)
GLUCOSE-CAPILLARY: 93 mg/dL (ref 65–99)
Glucose-Capillary: 137 mg/dL — ABNORMAL HIGH (ref 65–99)

## 2016-03-11 LAB — BASIC METABOLIC PANEL
Anion gap: 11 (ref 5–15)
BUN: 27 mg/dL — ABNORMAL HIGH (ref 6–20)
CHLORIDE: 108 mmol/L (ref 101–111)
CO2: 20 mmol/L — ABNORMAL LOW (ref 22–32)
CREATININE: 3.11 mg/dL — AB (ref 0.61–1.24)
Calcium: 8.3 mg/dL — ABNORMAL LOW (ref 8.9–10.3)
GFR, EST AFRICAN AMERICAN: 27 mL/min — AB (ref >60)
GFR, EST NON AFRICAN AMERICAN: 23 mL/min — AB (ref >60)
Glucose, Bld: 116 mg/dL — ABNORMAL HIGH (ref 65–99)
Potassium: 4.3 mmol/L (ref 3.5–5.1)
SODIUM: 139 mmol/L (ref 135–145)

## 2016-03-11 MED ORDER — DIAZEPAM 5 MG PO TABS
5.0000 mg | ORAL_TABLET | Freq: Four times a day (QID) | ORAL | Status: DC | PRN
  Administered 2016-03-11 – 2016-03-13 (×9): 5 mg via ORAL
  Filled 2016-03-11 (×9): qty 1

## 2016-03-11 MED ORDER — OXYCODONE-ACETAMINOPHEN 5-325 MG PO TABS
1.0000 | ORAL_TABLET | ORAL | Status: DC | PRN
  Administered 2016-03-11 – 2016-03-12 (×6): 2 via ORAL
  Administered 2016-03-13: 1 via ORAL
  Administered 2016-03-13 – 2016-03-30 (×66): 2 via ORAL
  Filled 2016-03-11 (×61): qty 2
  Filled 2016-03-11: qty 1
  Filled 2016-03-11 (×12): qty 2

## 2016-03-11 MED ORDER — PANTOPRAZOLE SODIUM 40 MG PO TBEC
40.0000 mg | DELAYED_RELEASE_TABLET | Freq: Every day | ORAL | Status: DC
  Administered 2016-03-11 – 2016-03-17 (×7): 40 mg via ORAL
  Filled 2016-03-11 (×7): qty 1

## 2016-03-11 NOTE — Care Management Note (Signed)
Case Management Note  Patient Details  Name: Drew Prince MRN: [id number] Date of Birth: 05/23/1976  Subjective/Objective:                    Action/Plan:  UR updated  Expected Discharge Date:                  Expected Discharge Plan:  Home w Home Health Services  In-House Referral:     Discharge planning Services  CM Consult  Post Acute Care Choice:    Choice offered to:     DME Arranged:    DME Agency:     HH Arranged:    HH Agency:     Status of Service:  In process, will continue to follow  Medicare Important Message Given:    Date Medicare IM Given:    Medicare IM give by:    Date Additional Medicare IM Given:    Additional Medicare Important Message give by:     If discussed at Long Length of Stay Meetings, dates discussed:    Additional Comments:  Drew Prince, Drew Wrightsman Marie, RN 03/11/2016, 3:11 PM

## 2016-03-11 NOTE — Progress Notes (Signed)
Admit: 03/07/2016 LOS: 4  45M with AKI 2/2 Contrast/NSAIDs following cholecystectomy and return with abdominal pain  Subjective:  No new events Tolerating CLD Good UOP   03/14 0701 - 03/15 0700 In: 2740 [P.O.:1440; I.V.:1200; IV Piggyback:100] Out: 3400 [Urine:3000; Drains:400]  Filed Weights   03/07/16 1643 03/11/16 0455  Weight: 79.5 kg (175 lb 4.3 oz) 89.3 kg (196 lb 13.9 oz)    Scheduled Meds: . ciprofloxacin  400 mg Intravenous Q24H   And  . metronidazole  500 mg Intravenous Q8H  . insulin aspart  0-9 Units Subcutaneous 6 times per day  . pantoprazole (PROTONIX) IV  40 mg Intravenous QHS  . sodium chloride  1,000 mL Intravenous Once   Continuous Infusions:  PRN Meds:.acetaminophen, diazepam, HYDROmorphone (DILAUDID) injection, HYDROmorphone (DILAUDID) injection, ondansetron **OR** ondansetron (ZOFRAN) IV, oxyCODONE-acetaminophen, promethazine  Current Labs: reviewed, AM labs pending  Physical Exam:  Blood pressure 151/90, pulse 99, temperature 98.6 F (37 C), temperature source Oral, resp. rate 24, height 5\' 4"  (1.626 m), weight 89.3 kg (196 lb 13.9 oz), SpO2 99 %. GEN: NAD, lying in bed ENT: NCAT EYES: EOMI, glasses on CV: RRR no mgr PULM: CTAB, nl wob ABD: bandaged c/d/i, mild ttp, not firm SKIN: no rasshes/lesios EXT:no LEE NEURO: Nonfocal  A 1. Nonoliguric AKI 2/2 CIN and NSAID use; normal baseline SCr 2. Peritonitis s/p Cholecystectomy 3/8 3. Abd pain 4. Hx/o pancreatitis with pseudocyst 12/2015 5. HTN  P 1. Await AM labs, expect improvement 2. Stop IVFs, tolerating liquid diet, edematous Daily weights, Daily Renal Panel, Strict I/Os, Avoid nephrotoxins (NSAIDs, judicious IV Contrast)    Sabra Heckyan Paytience Bures MD 03/11/2016, 10:57 AM   Recent Labs Lab 03/09/16 0054 03/09/16 1314 03/10/16 0348  NA 138 141 139  K 4.2 4.3 3.7  CL 104 111 110  CO2 22 12* 17*  GLUCOSE 98 114* 98  BUN 18 23* 29*  CREATININE 2.64* 3.00* 3.39*  CALCIUM 8.4* 7.4* 7.7*     Recent Labs Lab 03/09/16 0054 03/09/16 1314 03/10/16 0348  WBC 10.4 11.7* 10.2  HGB 10.9* 11.2* 10.3*  HCT 34.5* 34.2* 31.7*  MCV 82.1 80.1 79.6  PLT 204 204 219

## 2016-03-11 NOTE — Progress Notes (Signed)
2 Days Post-Op  Subjective: Pt doing well this AM. abd pain improved.  Objective: Vital signs in last 24 hours: Temp:  [98 F (36.7 C)-98.8 F (37.1 C)] 98 F (36.7 C) (03/15 0304) Pulse Rate:  [92-112] 92 (03/15 0300) Resp:  [13-41] 19 (03/15 0442) BP: (134-159)/(95-105) 149/99 mmHg (03/15 0300) SpO2:  [97 %-100 %] 99 % (03/15 0442) Weight:  [89.3 kg (196 lb 13.9 oz)] 89.3 kg (196 lb 13.9 oz) (03/15 0455)    Intake/Output from previous day: 03/14 0701 - 03/15 0700 In: 2740 [P.O.:1440; I.V.:1200; IV Piggyback:100] Out: 3400 [Urine:3000; Drains:400] Intake/Output this shift:    General appearance: alert and cooperative Cardio: regular rate and rhythm, S1, S2 normal, no murmur, click, rub or gallop GI: soft,  min ttp  R side of abd, no rebound/guarding  Lab Results:   Recent Labs  03/09/16 1314 03/10/16 0348  WBC 11.7* 10.2  HGB 11.2* 10.3*  HCT 34.2* 31.7*  PLT 204 219   BMET  Recent Labs  03/09/16 1314 03/10/16 0348  NA 141 139  K 4.3 3.7  CL 111 110  CO2 12* 17*  GLUCOSE 114* 98  BUN 23* 29*  CREATININE 3.00* 3.39*  CALCIUM 7.4* 7.7*   PT/INR No results for input(s): LABPROT, INR in the last 72 hours. ABG No results for input(s): PHART, HCO3 in the last 72 hours.  Invalid input(s): PCO2, PO2  Studies/Results: No results found.  Anti-infectives: Anti-infectives    Start     Dose/Rate Route Frequency Ordered Stop   03/11/16 1100  ciprofloxacin (CIPRO) IVPB 400 mg     400 mg 200 mL/hr over 60 Minutes Intravenous Every 24 hours 03/10/16 1319     03/10/16 1930  metroNIDAZOLE (FLAGYL) IVPB 500 mg     500 mg 100 mL/hr over 60 Minutes Intravenous Every 8 hours 03/10/16 1319     03/09/16 1200  metroNIDAZOLE (FLAGYL) IVPB 500 mg  Status:  Discontinued     500 mg 100 mL/hr over 60 Minutes Intravenous To Surgery 03/09/16 1145 03/09/16 1543   03/08/16 0600  vancomycin (VANCOCIN) IVPB 1000 mg/200 mL premix  Status:  Discontinued     1,000 mg 200 mL/hr  over 60 Minutes Intravenous Every 8 hours 03/08/16 0550 03/09/16 1548   03/07/16 1130  ciprofloxacin (CIPRO) IVPB 400 mg  Status:  Discontinued     400 mg 200 mL/hr over 60 Minutes Intravenous Every 12 hours 03/07/16 1125 03/10/16 1319   03/07/16 1130  metroNIDAZOLE (FLAGYL) IVPB 500 mg  Status:  Discontinued     500 mg 100 mL/hr over 60 Minutes Intravenous Every 8 hours 03/07/16 1125 03/10/16 1319   03/07/16 0530  ceFEPIme (MAXIPIME) 1 g in dextrose 5 % 50 mL IVPB     1 g 100 mL/hr over 30 Minutes Intravenous  Once 03/07/16 0516 03/07/16 0700   03/07/16 0530  metroNIDAZOLE (FLAGYL) IVPB 500 mg     500 mg 100 mL/hr over 60 Minutes Intravenous  Once 03/07/16 0516 03/07/16 0612      Assessment/Plan: s/p Procedure(s): LAPAROSCOPY DIAGNOSTIC (N/A) LAPAROSCOPIC EVACUATION OF HEMATOMA ADv diet to FLD To floor PT PO Pain Rx Muscle relaxer for muscle spasms   LOS: 4 days    Marigene Ehlersamirez Jr., Jed LimerickArmando 03/11/2016

## 2016-03-12 LAB — BASIC METABOLIC PANEL
Anion gap: 10 (ref 5–15)
BUN: 24 mg/dL — ABNORMAL HIGH (ref 6–20)
CHLORIDE: 103 mmol/L (ref 101–111)
CO2: 23 mmol/L (ref 22–32)
Calcium: 8.3 mg/dL — ABNORMAL LOW (ref 8.9–10.3)
Creatinine, Ser: 2.94 mg/dL — ABNORMAL HIGH (ref 0.61–1.24)
GFR calc non Af Amer: 25 mL/min — ABNORMAL LOW (ref >60)
GFR, EST AFRICAN AMERICAN: 29 mL/min — AB (ref >60)
Glucose, Bld: 117 mg/dL — ABNORMAL HIGH (ref 65–99)
POTASSIUM: 3.7 mmol/L (ref 3.5–5.1)
SODIUM: 136 mmol/L (ref 135–145)

## 2016-03-12 LAB — GLUCOSE, CAPILLARY
GLUCOSE-CAPILLARY: 100 mg/dL — AB (ref 65–99)
GLUCOSE-CAPILLARY: 89 mg/dL (ref 65–99)
GLUCOSE-CAPILLARY: 98 mg/dL (ref 65–99)
Glucose-Capillary: 97 mg/dL (ref 65–99)
Glucose-Capillary: 96 mg/dL (ref 65–99)
Glucose-Capillary: 116 mg/dL — ABNORMAL HIGH (ref 65–99)

## 2016-03-12 LAB — CULTURE, BLOOD (ROUTINE X 2)
Culture: NO GROWTH
Culture: NO GROWTH

## 2016-03-12 LAB — CBC
HCT: 35.3 % — ABNORMAL LOW (ref 39.0–52.0)
HEMOGLOBIN: 11.8 g/dL — AB (ref 13.0–17.0)
MCH: 26 pg (ref 26.0–34.0)
MCHC: 33.4 g/dL (ref 30.0–36.0)
MCV: 77.8 fL — ABNORMAL LOW (ref 78.0–100.0)
Platelets: 310 10*3/uL (ref 150–400)
RBC: 4.54 MIL/uL (ref 4.22–5.81)
RDW: 14.3 % (ref 11.5–15.5)
WBC: 12.9 10*3/uL — ABNORMAL HIGH (ref 4.0–10.5)

## 2016-03-12 MED ORDER — SODIUM CHLORIDE 0.9 % IV BOLUS (SEPSIS)
1000.0000 mL | Freq: Once | INTRAVENOUS | Status: AC
  Administered 2016-03-12: 1000 mL via INTRAVENOUS

## 2016-03-12 NOTE — Evaluation (Signed)
Physical Therapy Evaluation Patient Details Name: Drew Prince MRN: [id number] DOB: 1976/09/11 Today's Date: 03/12/2016   History of Present Illness  patient is a 40 yo male who presented with abdominal pain and peritonitis status post lap chole  Clinical Impression  Patient mobilizing well despite significant pain. Anticipate as pain resolves patient will have no difficulties. Educated patient on transitional movements, expectations for mobility, and splinting of abdominal region. At this time, no further acute PT needs, highly encourage continued mobility with staff.    Follow Up Recommendations No PT follow up;Supervision for mobility/OOB    Equipment Recommendations       Recommendations for Other Services  (mobility team)     Precautions / Restrictions Restrictions Weight Bearing Restrictions: No      Mobility  Bed Mobility Overal bed mobility: Needs Assistance Bed Mobility: Supine to Sit;Sit to Supine     Supine to sit: Supervision Sit to supine: Supervision   General bed mobility comments: Vcs for technique, increased time and effort secondary to pain, no physical assist required  Transfers Overall transfer level: Modified independent Equipment used: Rolling walker (2 wheeled)             General transfer comment: has been mobilizing to the bathroom, can transfer without assist   Ambulation/Gait Ambulation/Gait assistance: Supervision;Modified independent (Device/Increase time) Ambulation Distance (Feet): 240 Feet Assistive device: Rolling walker (2 wheeled) Gait Pattern/deviations: Trunk flexed Gait velocity: decreased   General Gait Details: most limiting factor of gait was pain, several standing rest breaks and relinace on RW due to pain and muscle spasms  Stairs Stairs: Yes Stairs assistance: Modified independent (Device/Increase time) Stair Management: One rail Right;Step to pattern;Forwards Number of Stairs: 5 General stair comments: no  assist or cues, increased pain upon completion secondary to spasms  Wheelchair Mobility    Modified Rankin (Stroke Patients Only)       Balance                                             Pertinent Vitals/Pain Pain Assessment: 0-10 Pain Score: 8  Pain Location: abdominal region Pain Descriptors / Indicators: Stabbing;Spasm;Pressure;Operative site guarding;Penetrating Pain Intervention(s): Monitored during session;Repositioned;RN gave pain meds during session;Relaxation    Home Living Family/patient expects to be discharged to:: Private residence Living Arrangements: Spouse/significant other Available Help at Discharge: Family;Available PRN/intermittently Type of Home: House Home Access: Stairs to enter   [employer] Corporation[employer]trance Stairs-Number of Steps: 4 Home Layout: One level Home Equipment: None      Prior Function Level of Independence: Independent               Hand Dominance   Dominant Hand: Right    Extremity/Trunk Assessment   Upper Extremity Assessment: Overall WFL for tasks assessed           Lower Extremity Assessment: Overall WFL for tasks assessed      Cervical / Trunk Assessment:  (painful abdominal region/incision)  Communication   Communication: No difficulties  Cognition Arousal/Alertness: Awake/alert Behavior During Therapy: WFL for tasks assessed/performed Overall Cognitive Status: Within Functional Limits for tasks assessed                      General Comments General comments (skin integrity, edema, etc.): educated on use of pillow for splinting, encouraged use of IS and continued mobility with staff.    Exercises  Assessment/Plan    PT Assessment Patent does not need any further PT services  PT Diagnosis Difficulty walking;Abnormality of gait;Acute pain   PT Problem List    PT Treatment Interventions     PT Goals (Current goals can be found in the Care Plan section) Acute Rehab PT Goals Patient  Stated Goal: to not be in pain PT Goal Formulation: All assessment and education complete, DC therapy    Frequency     Barriers to discharge        Co-evaluation               End of Session   Activity Tolerance: Patient tolerated treatment well;Patient limited by pain Patient left: in bed;with call bell/phone within reach;with family/visitor present Nurse Communication: Mobility status         Time: 1610-9604 PT Time Calculation (min) (ACUTE ONLY): 26 min   Charges:   PT Evaluation $PT Eval Moderate Complexity: 1 Procedure PT Treatments $Gait Training: 8-22 mins   PT G CodesFabio Prince 03-23-16, 5:40 PM Drew Prince, PT DPT  [phone]

## 2016-03-12 NOTE — Progress Notes (Signed)
Admit: 03/07/2016 LOS: 5  82M with AKI 2/2 Contrast/NSAIDs following cholecystectomy and return with abdominal pain  Subjective:  Tolerating liquids GFR recovering Great UOP No new events  03/15 0701 - 03/16 0700 In: 60696.4 [I.V.:6563.1; IV Piggyback:54133.3] Out: 3161 [Urine:2900; Drains:260; Stool:1]  Filed Weights   03/07/16 1643 03/11/16 0455 03/12/16 0500  Weight: 79.5 kg (175 lb 4.3 oz) 89.3 kg (196 lb 13.9 oz) 90.3 kg (199 lb 1.2 oz)    Scheduled Meds: . ciprofloxacin  400 mg Intravenous Q24H   And  . metronidazole  500 mg Intravenous Q8H  . insulin aspart  0-9 Units Subcutaneous 6 times per day  . pantoprazole  40 mg Oral Daily  . sodium chloride  1,000 mL Intravenous Once   Continuous Infusions:  PRN Meds:.acetaminophen, diazepam, HYDROmorphone (DILAUDID) injection, HYDROmorphone (DILAUDID) injection, ondansetron **OR** ondansetron (ZOFRAN) IV, oxyCODONE-acetaminophen, promethazine  Current Labs: reviewed, AM labs pending  Physical Exam:  Blood pressure 156/95, pulse 101, temperature 97.9 F (36.6 C), temperature source Oral, resp. rate 18, height 5\' 4"  (1.626 m), weight 90.3 kg (199 lb 1.2 oz), SpO2 96 %. GEN: NAD, lying in bed ENT: NCAT EYES: EOMI, glasses on CV: RRR no mgr PULM: CTAB, nl wob ABD: bandaged c/d/i, mild ttp, not firm SKIN: no rasshes/lesios EXT:no LEE NEURO: Nonfocal  A 1. Nonoliguric AKI 2/2 CIN and NSAID use; normal baseline SCr 2. Peritonitis s/p Cholecystectomy 3/8 3. Abd pain 4. Hx/o pancreatitis with pseudocyst 12/2015 5. HTN  P 1. Clearly recovering GFR at this point; expect ongoing and should return to baseline 2. Daily weights, Daily Renal Panel, Strict I/Os, Avoid nephrotoxins (NSAIDs, judicious IV Contrast)  Will sign off for now.  Please call with any questions or concerns.  Pt does need follow up with nephrology and I will arrange to see me in office with labs prior.

## 2016-03-12 NOTE — Progress Notes (Signed)
Patient ID: Aztlan Coll, male   DOB: 10/29/1976, 40 y.o.   MRN: [id number]     [address] [place], [place] [address]    Phone: [phone] FAX: [phone]     Subjective: Passing flatus.  Still with muscle spasms.  Mobilizing more.  Little PO intake.  Drinking surgery drinks, one cup of water.  Good UOP.  Afebrile.  VSS. 248m drain output.  Objective:  Vital signs:  Filed Vitals:   03/12/16 0157 03/12/16 0500 03/12/16 0624 03/12/16 1245  BP: 144/91  156/95 156/91  Pulse: 102  101 99  Temp: 98.4 F (36.9 C)  97.9 F (36.6 C) 97.8 F (36.6 C)  TempSrc:    Oral  Resp: '18  18 18  '$ Height:      Weight:  90.3 kg (199 lb 1.2 oz)    SpO2: 98%  96% 97%    Last BM Date: 03/12/16  Intake/Output   Yesterday:  03/15 0701 - 03/16 0700 In: 681275.1[I.V.:6563.1; IV Piggyback:54133.3] Out: 37001[Urine:2900; Drains:260; Stool:1] This shift:  Total I/O In: 600 [P.O.:600] Out: 1260 [Urine:1150; Drains:110]  Physical Exam: General: Pt awake/alert/oriented x4 in no  acute distress  Abdomen: Soft.  Nondistended.   Mildly tender at incisions JP drain with light brown output.  No evidence of peritonitis.  No incarcerated hernias.    Problem List:   Active Problems:   Peritonitis ([facility]    Results:   Labs: Results for orders placed or performed during the hospital encounter of 03/07/16 (from the past 48 hour(s))  Glucose, capillary     Status: Abnormal   Collection Time: 03/10/16  5:36 PM  Result Value Ref Range   Glucose-Capillary 128 (H) 65 - 99 mg/dL   Comment 1 Notify RN    Comment 2 Document in Chart   Glucose, capillary     Status: Abnormal   Collection Time: 03/10/16  7:19 PM  Result Value Ref Range   Glucose-Capillary 104 (H) 65 - 99 mg/dL  Glucose, capillary     Status: Abnormal   Collection Time: 03/10/16 10:58 PM  Result Value Ref Range   Glucose-Capillary 122 (H) 65 - 99 mg/dL   Glucose, capillary     Status: None   Collection Time: 03/11/16  3:11 AM  Result Value Ref Range   Glucose-Capillary 94 65 - 99 mg/dL  Glucose, capillary     Status: Abnormal   Collection Time: 03/11/16  7:27 AM  Result Value Ref Range   Glucose-Capillary 137 (H) 65 - 99 mg/dL   Comment 1 Notify RN    Comment 2 Document in Chart   Glucose, capillary     Status: Abnormal   Collection Time: 03/11/16 11:29 AM  Result Value Ref Range   Glucose-Capillary 107 (H) 65 - 99 mg/dL   Comment 1 Notify RN    Comment 2 Document in Chart   CBC     Status: Abnormal   Collection Time: 03/11/16 12:05 PM  Result Value Ref Range   WBC 11.2 (H) 4.0 - 10.5 K/uL   RBC 4.56 4.22 - 5.81 MIL/uL   Hemoglobin 11.8 (L) 13.0 - 17.0 g/dL   HCT 35.8 (L) 39.0 - 52.0 %   MCV 78.5 78.0 - 100.0 fL   MCH 25.9 (L) 26.0 - 34.0 pg   MCHC 33.0 30.0 - 36.0 g/dL   RDW 14.3 11.5 - 15.5 %   Platelets 253 150 -  400 K/uL  Basic metabolic panel     Status: Abnormal   Collection Time: 03/11/16 12:05 PM  Result Value Ref Range   Sodium 139 135 - 145 mmol/L   Potassium 4.3 3.5 - 5.1 mmol/L   Chloride 108 101 - 111 mmol/L   CO2 20 (L) 22 - 32 mmol/L   Glucose, Bld 116 (H) 65 - 99 mg/dL   BUN 27 (H) 6 - 20 mg/dL   Creatinine, Ser 3.11 (H) 0.61 - 1.24 mg/dL   Calcium 8.3 (L) 8.9 - 10.3 mg/dL   GFR calc non Af Amer 23 (L) >60 mL/min   GFR calc Af Amer 27 (L) >60 mL/min    Comment: (NOTE) The eGFR has been calculated using the CKD EPI equation. This calculation has not been validated in all clinical situations. eGFR's persistently <60 mL/min signify possible Chronic Kidney Disease.    Anion gap 11 5 - 15  Glucose, capillary     Status: Abnormal   Collection Time: 03/11/16  3:45 PM  Result Value Ref Range   Glucose-Capillary 103 (H) 65 - 99 mg/dL  Glucose, capillary     Status: None   Collection Time: 03/11/16  8:12 PM  Result Value Ref Range   Glucose-Capillary 93 65 - 99 mg/dL   Comment 1 Notify RN    Comment 2  Document in Chart   Glucose, capillary     Status: None   Collection Time: 03/12/16 12:07 AM  Result Value Ref Range   Glucose-Capillary 98 65 - 99 mg/dL   Comment 1 Notify RN    Comment 2 Document in Chart   Glucose, capillary     Status: Abnormal   Collection Time: 03/12/16  4:00 AM  Result Value Ref Range   Glucose-Capillary 100 (H) 65 - 99 mg/dL   Comment 1 Notify RN    Comment 2 Document in Chart   CBC     Status: Abnormal   Collection Time: 03/12/16  4:30 AM  Result Value Ref Range   WBC 12.9 (H) 4.0 - 10.5 K/uL   RBC 4.54 4.22 - 5.81 MIL/uL   Hemoglobin 11.8 (L) 13.0 - 17.0 g/dL   HCT 35.3 (L) 39.0 - 52.0 %   MCV 77.8 (L) 78.0 - 100.0 fL   MCH 26.0 26.0 - 34.0 pg   MCHC 33.4 30.0 - 36.0 g/dL   RDW 14.3 11.5 - 15.5 %   Platelets 310 150 - 400 K/uL  Basic metabolic panel     Status: Abnormal   Collection Time: 03/12/16  4:30 AM  Result Value Ref Range   Sodium 136 135 - 145 mmol/L   Potassium 3.7 3.5 - 5.1 mmol/L   Chloride 103 101 - 111 mmol/L   CO2 23 22 - 32 mmol/L   Glucose, Bld 117 (H) 65 - 99 mg/dL   BUN 24 (H) 6 - 20 mg/dL   Creatinine, Ser 2.94 (H) 0.61 - 1.24 mg/dL   Calcium 8.3 (L) 8.9 - 10.3 mg/dL   GFR calc non Af Amer 25 (L) >60 mL/min   GFR calc Af Amer 29 (L) >60 mL/min    Comment: (NOTE) The eGFR has been calculated using the CKD EPI equation. This calculation has not been validated in all clinical situations. eGFR's persistently <60 mL/min signify possible Chronic Kidney Disease.    Anion gap 10 5 - 15  Glucose, capillary     Status: None   Collection Time: 03/12/16  7:19 AM  Result Value  Ref Range   Glucose-Capillary 97 65 - 99 mg/dL  Glucose, capillary     Status: None   Collection Time: 03/12/16 11:21 AM  Result Value Ref Range   Glucose-Capillary 96 65 - 99 mg/dL  Glucose, capillary     Status: Abnormal   Collection Time: 03/12/16  4:02 PM  Result Value Ref Range   Glucose-Capillary 116 (H) 65 - 99 mg/dL    Imaging / Studies: No  results found.  Medications / Allergies:  Scheduled Meds: . ciprofloxacin  400 mg Intravenous Q24H   And  . metronidazole  500 mg Intravenous Q8H  . insulin aspart  0-9 Units Subcutaneous 6 times per day  . pantoprazole  40 mg Oral Daily  . sodium chloride  1,000 mL Intravenous Once   Continuous Infusions:  PRN Meds:.acetaminophen, diazepam, HYDROmorphone (DILAUDID) injection, HYDROmorphone (DILAUDID) injection, ondansetron **OR** ondansetron (ZOFRAN) IV, oxyCODONE-acetaminophen, promethazine  Antibiotics: Anti-infectives    Start     Dose/Rate Route Frequency Ordered Stop   03/11/16 1100  ciprofloxacin (CIPRO) IVPB 400 mg     400 mg 200 mL/hr over 60 Minutes Intravenous Every 24 hours 03/10/16 1319     03/10/16 1930  metroNIDAZOLE (FLAGYL) IVPB 500 mg     500 mg 100 mL/hr over 60 Minutes Intravenous Every 8 hours 03/10/16 1319     03/09/16 1200  metroNIDAZOLE (FLAGYL) IVPB 500 mg  Status:  Discontinued     500 mg 100 mL/hr over 60 Minutes Intravenous To Surgery 03/09/16 1145 03/09/16 1543   03/08/16 0600  vancomycin (VANCOCIN) IVPB 1000 mg/200 mL premix  Status:  Discontinued     1,000 mg 200 mL/hr over 60 Minutes Intravenous Every 8 hours 03/08/16 0550 03/09/16 1548   03/07/16 1130  ciprofloxacin (CIPRO) IVPB 400 mg  Status:  Discontinued     400 mg 200 mL/hr over 60 Minutes Intravenous Every 12 hours 03/07/16 1125 03/10/16 1319   03/07/16 1130  metroNIDAZOLE (FLAGYL) IVPB 500 mg  Status:  Discontinued     500 mg 100 mL/hr over 60 Minutes Intravenous Every 8 hours 03/07/16 1125 03/10/16 1319   03/07/16 0530  ceFEPIme (MAXIPIME) 1 g in dextrose 5 % 50 mL IVPB     1 g 100 mL/hr over 30 Minutes Intravenous  Once 03/07/16 0516 03/07/16 0700   03/07/16 0530  metroNIDAZOLE (FLAGYL) IVPB 500 mg     500 mg 100 mL/hr over 60 Minutes Intravenous  Once 03/07/16 0516 03/07/16 0612        Assessment/Plan POD#3 diagnostic laparoscopy and evacuation of a hematoma -PO intake is  poor, will bolus with 1L NS, encouraged to drink more water, less sugary beverages -continue drain care -mobilize, IS pain control  AKI-renal panel in AM   Zayla Agar, ANP-BC [facility]    03/12/2016 4:24 PM

## 2016-03-13 LAB — BASIC METABOLIC PANEL
Anion gap: 10 (ref 5–15)
BUN: 18 mg/dL (ref 6–20)
CALCIUM: 8.1 mg/dL — AB (ref 8.9–10.3)
CO2: 24 mmol/L (ref 22–32)
Chloride: 102 mmol/L (ref 101–111)
Creatinine, Ser: 2.8 mg/dL — ABNORMAL HIGH (ref 0.61–1.24)
GFR, EST AFRICAN AMERICAN: 31 mL/min — AB (ref >60)
GFR, EST NON AFRICAN AMERICAN: 27 mL/min — AB (ref >60)
GLUCOSE: 130 mg/dL — AB (ref 65–99)
Potassium: 3 mmol/L — ABNORMAL LOW (ref 3.5–5.1)
Sodium: 136 mmol/L (ref 135–145)

## 2016-03-13 LAB — CULTURE, BLOOD (ROUTINE X 2)
CULTURE: NO GROWTH
Culture: NO GROWTH

## 2016-03-13 LAB — HEPATIC FUNCTION PANEL
ALBUMIN: 2.4 g/dL — AB (ref 3.5–5.0)
ALK PHOS: 72 U/L (ref 38–126)
ALT: 23 U/L (ref 17–63)
AST: 22 U/L (ref 15–41)
BILIRUBIN INDIRECT: 0.4 mg/dL (ref 0.3–0.9)
Bilirubin, Direct: 0.2 mg/dL (ref 0.1–0.5)
TOTAL PROTEIN: 5.9 g/dL — AB (ref 6.5–8.1)
Total Bilirubin: 0.6 mg/dL (ref 0.3–1.2)

## 2016-03-13 LAB — GLUCOSE, CAPILLARY
GLUCOSE-CAPILLARY: 127 mg/dL — AB (ref 65–99)
Glucose-Capillary: 103 mg/dL — ABNORMAL HIGH (ref 65–99)
Glucose-Capillary: 106 mg/dL — ABNORMAL HIGH (ref 65–99)
Glucose-Capillary: 107 mg/dL — ABNORMAL HIGH (ref 65–99)
Glucose-Capillary: 145 mg/dL — ABNORMAL HIGH (ref 65–99)

## 2016-03-13 MED ORDER — METRONIDAZOLE 500 MG PO TABS
500.0000 mg | ORAL_TABLET | Freq: Three times a day (TID) | ORAL | Status: DC
  Administered 2016-03-13 – 2016-03-18 (×14): 500 mg via ORAL
  Filled 2016-03-13 (×14): qty 1

## 2016-03-13 MED ORDER — CYCLOBENZAPRINE HCL 5 MG PO TABS
5.0000 mg | ORAL_TABLET | Freq: Three times a day (TID) | ORAL | Status: DC | PRN
  Administered 2016-03-13 – 2016-03-17 (×7): 5 mg via ORAL
  Filled 2016-03-13 (×7): qty 1

## 2016-03-13 MED ORDER — CIPROFLOXACIN HCL 500 MG PO TABS
500.0000 mg | ORAL_TABLET | Freq: Two times a day (BID) | ORAL | Status: DC
  Administered 2016-03-13 – 2016-03-18 (×11): 500 mg via ORAL
  Filled 2016-03-13 (×11): qty 1

## 2016-03-13 MED ORDER — AMLODIPINE BESYLATE 10 MG PO TABS
10.0000 mg | ORAL_TABLET | Freq: Every day | ORAL | Status: DC
  Administered 2016-03-13 – 2016-03-30 (×18): 10 mg via ORAL
  Filled 2016-03-13 (×18): qty 1

## 2016-03-13 NOTE — Progress Notes (Signed)
4 Days Post-Op  Subjective: Pt doing well today.  Some con't sporadic abdominal wall pain.  Mobilizing, min appetite.  Objective: Vital signs in last 24 hours: Temp:  [99.4 F (37.4 C)-99.7 F (37.6 C)] 99.6 F (37.6 C) (03/17 0410) Pulse Rate:  [99-107] 107 (03/17 0410) Resp:  [19-21] 21 (03/17 0410) BP: (153-166)/(91-109) 153/91 mmHg (03/17 0625) SpO2:  [97 %-100 %] 100 % (03/17 0410) Weight:  [86.9 kg (191 lb 9.3 oz)] 86.9 kg (191 lb 9.3 oz) (03/17 0422) Last BM Date: 03/13/16  Intake/Output from previous day: 03/16 0701 - 03/17 0700 In: 960 [P.O.:960] Out: 3485 [Urine:3280; Drains:205] Intake/Output this shift: Total I/O In: 1180 [P.O.:480; IV Piggyback:700] Out: 30 [Drains:30]  General appearance: alert and cooperative Cardio: regular rate and rhythm, S1, S2 normal, no murmur, click, rub or gallop GI: soft, approp ttp, Nd  Lab Results:   Recent Labs  03/11/16 1205 03/12/16 0430  WBC 11.2* 12.9*  HGB 11.8* 11.8*  HCT 35.8* 35.3*  PLT 253 310   BMET  Recent Labs  03/12/16 0430 03/13/16 0420  NA 136 136  K 3.7 3.0*  CL 103 102  CO2 23 24  GLUCOSE 117* 130*  BUN 24* 18  CREATININE 2.94* 2.80*  CALCIUM 8.3* 8.1*   PT/INR No results for input(s): LABPROT, INR in the last 72 hours. ABG No results for input(s): PHART, HCO3 in the last 72 hours.  Invalid input(s): PCO2, PO2  Studies/Results: No results found.  Anti-infectives: Anti-infectives    Start     Dose/Rate Route Frequency Ordered Stop   03/13/16 1400  metroNIDAZOLE (FLAGYL) tablet 500 mg     500 mg Oral 3 times per day 03/13/16 1053     03/13/16 1100  ciprofloxacin (CIPRO) tablet 500 mg     500 mg Oral 2 times daily 03/13/16 1053     03/11/16 1100  ciprofloxacin (CIPRO) IVPB 400 mg  Status:  Discontinued     400 mg 200 mL/hr over 60 Minutes Intravenous Every 24 hours 03/10/16 1319 03/13/16 1053   03/10/16 1930  metroNIDAZOLE (FLAGYL) IVPB 500 mg  Status:  Discontinued     500 mg 100  mL/hr over 60 Minutes Intravenous Every 8 hours 03/10/16 1319 03/13/16 1053   03/09/16 1200  metroNIDAZOLE (FLAGYL) IVPB 500 mg  Status:  Discontinued     500 mg 100 mL/hr over 60 Minutes Intravenous To Surgery 03/09/16 1145 03/09/16 1543   03/08/16 0600  vancomycin (VANCOCIN) IVPB 1000 mg/200 mL premix  Status:  Discontinued     1,000 mg 200 mL/hr over 60 Minutes Intravenous Every 8 hours 03/08/16 0550 03/09/16 1548   03/07/16 1130  ciprofloxacin (CIPRO) IVPB 400 mg  Status:  Discontinued     400 mg 200 mL/hr over 60 Minutes Intravenous Every 12 hours 03/07/16 1125 03/10/16 1319   03/07/16 1130  metroNIDAZOLE (FLAGYL) IVPB 500 mg  Status:  Discontinued     500 mg 100 mL/hr over 60 Minutes Intravenous Every 8 hours 03/07/16 1125 03/10/16 1319   03/07/16 0530  ceFEPIme (MAXIPIME) 1 g in dextrose 5 % 50 mL IVPB     1 g 100 mL/hr over 30 Minutes Intravenous  Once 03/07/16 0516 03/07/16 0700   03/07/16 0530  metroNIDAZOLE (FLAGYL) IVPB 500 mg     500 mg 100 mL/hr over 60 Minutes Intravenous  Once 03/07/16 0516 03/07/16 0612      Assessment/Plan: s/p Procedure(s): LAPAROSCOPY DIAGNOSTIC (N/A) LAPAROSCOPIC EVACUATION OF HEMATOMA  Check LFTs today if  TB up will order HIDA for AM Con't to mobilize Change Abx to PO    LOS: 6 days    Marigene Ehlers., [facility] 03/13/2016

## 2016-03-13 NOTE — Clinical Documentation Improvement (Addendum)
General Surgery  (Query response must be documented in the current medical record, not on the CDI BPA form. Thank you.)  Query 1 of 2     (please scroll down) Possible Clinical Conditions:  - Sepsis, present or evolving on admission  - Other condition  - Unable to clinically determine  Clinical Information/Indicators: Admission diagnosis - Peritonitis WBC on admission 14.2 Lactic Acid on admission 2.2, repeat 6 hours later 1.5 Pulse 125 on admission  Query 2 of 2  Please document if a condition below provides greater specificity regarding the patient's AKI:  - ATN, including any associated cause(s) or condition(s)  - Other conditions  - Unable to clinically determine  Clinical Information/Indicators: "Nonoliguric AKI 2/2 CIN and NSAID use" documented in renal consult BUN/Cr/GFR trend this admission   (non-African American)  Component     Latest Ref Rng 03/07/2016 03/08/2016 03/08/2016 03/09/2016  BUN     6 - 20 mg/dL '10 6  18  '$ Creatinine     0.61 - 1.24 mg/dL 0.94 0.89  2.64 (H)  EGFR (Non-African Amer.)     >60 mL/min >60 >60  29 (L)   Component     Latest Ref Rng 03/09/2016 03/10/2016 03/11/2016 03/12/2016  BUN     6 - 20 mg/dL 23 (H) 29 (H) 27 (H) 24 (H)  Creatinine     0.61 - 1.24 mg/dL 3.00 (H) 3.39 (H) 3.11 (H) 2.94 (H)  EGFR (Non-African Amer.)     >60 mL/min 25 (L) 21 (L) 23 (L) 25 (L)   Component     Latest Ref Rng 03/13/2016  BUN     6 - 20 mg/dL 18  Creatinine     0.61 - 1.24 mg/dL 2.80 (H)  EGFR (Non-African Amer.)     >60 mL/min 27 (L)     Please exercise your independent, professional judgment when responding. A specific answer is not anticipated or expected.   Thank You, Erling Conte  RN BSN CCDS [phone] Health Information Management [place]

## 2016-03-14 ENCOUNTER — Inpatient Hospital Stay (HOSPITAL_COMMUNITY): Payer: BLUE CROSS/BLUE SHIELD

## 2016-03-14 DIAGNOSIS — K659 Peritonitis, unspecified: Secondary | ICD-10-CM

## 2016-03-14 LAB — GLUCOSE, CAPILLARY: GLUCOSE-CAPILLARY: 108 mg/dL — AB (ref 65–99)

## 2016-03-14 MED ORDER — BOOST / RESOURCE BREEZE PO LIQD
1.0000 | Freq: Three times a day (TID) | ORAL | Status: DC
Start: 2016-03-14 — End: 2016-03-19
  Administered 2016-03-14 – 2016-03-17 (×5): 1 via ORAL

## 2016-03-14 MED ORDER — TECHNETIUM TC 99M MEBROFENIN IV KIT
5.3800 | PACK | Freq: Once | INTRAVENOUS | Status: AC | PRN
  Administered 2016-03-14: 5 via INTRAVENOUS

## 2016-03-14 NOTE — Progress Notes (Signed)
5 Days Post-Op  Subjective: Still not feeling well.  Complaining of RUQ abdominal pain with occasional spasms Minimal appetite   Objective: Vital signs in last 24 hours: Temp:  [98.4 F (36.9 C)-98.6 F (37 C)] 98.4 F (36.9 C) (03/18 0459) Pulse Rate:  [101-102] 101 (03/18 0459) Resp:  [18] 18 (03/18 0459) BP: (153-162)/(93-95) 153/93 mmHg (03/18 0459) SpO2:  [95 %-99 %] 95 % (03/18 0459) Weight:  [87.2 kg (192 lb 3.9 oz)] 87.2 kg (192 lb 3.9 oz) (03/18 0459) Last BM Date: 03/13/16  Intake/Output from previous day: 03/17 0701 - 03/18 0700 In: 1960 [P.O.:1260; IV Piggyback:700] Out: 130 [Drains:130] Intake/Output this shift: Total I/O In: 240 [P.O.:240] Out: -   General appearance: alert, cooperative and no distress GI: mildly distended; RUQ drain with some bile staining on dressing Bile in drain bulb; tender in RUQ  Lab Results:   Recent Labs  03/11/16 1205 03/12/16 0430  WBC 11.2* 12.9*  HGB 11.8* 11.8*  HCT 35.8* 35.3*  PLT 253 310   BMET  Recent Labs  03/12/16 0430 03/13/16 0420  NA 136 136  K 3.7 3.0*  CL 103 102  CO2 23 24  GLUCOSE 117* 130*  BUN 24* 18  CREATININE 2.94* 2.80*  CALCIUM 8.3* 8.1*   Hepatic Function Latest Ref Rng 03/13/2016 03/09/2016 03/08/2016  Total Protein 6.5 - 8.1 g/dL 5.9(L) 5.7(L) 6.1(L)  Albumin 3.5 - 5.0 g/dL 2.4(L) 2.6(L) 3.0(L)  AST 15 - 41 U/L 22 38 68(H)  ALT 17 - 63 U/L 23 78(H) 125(H)  Alk Phosphatase 38 - 126 U/L 72 92 105  Total Bilirubin 0.3 - 1.2 mg/dL 0.6 2.3(H) 3.9(H)  Bilirubin, Direct 0.1 - 0.5 mg/dL 0.2 - -     PT/INR No results for input(s): LABPROT, INR in the last 72 hours. ABG No results for input(s): PHART, HCO3 in the last 72 hours.  Invalid input(s): PCO2, PO2  Studies/Results: No results found.  Anti-infectives: Anti-infectives    Start     Dose/Rate Route Frequency Ordered Stop   03/13/16 1400  metroNIDAZOLE (FLAGYL) tablet 500 mg     500 mg Oral 3 times per day 03/13/16 1053     03/13/16 1100  ciprofloxacin (CIPRO) tablet 500 mg     500 mg Oral 2 times daily 03/13/16 1053     03/11/16 1100  ciprofloxacin (CIPRO) IVPB 400 mg  Status:  Discontinued     400 mg 200 mL/hr over 60 Minutes Intravenous Every 24 hours 03/10/16 1319 03/13/16 1053   03/10/16 1930  metroNIDAZOLE (FLAGYL) IVPB 500 mg  Status:  Discontinued     500 mg 100 mL/hr over 60 Minutes Intravenous Every 8 hours 03/10/16 1319 03/13/16 1053   03/09/16 1200  metroNIDAZOLE (FLAGYL) IVPB 500 mg  Status:  Discontinued     500 mg 100 mL/hr over 60 Minutes Intravenous To Surgery 03/09/16 1145 03/09/16 1543   03/08/16 0600  vancomycin (VANCOCIN) IVPB 1000 mg/200 mL premix  Status:  Discontinued     1,000 mg 200 mL/hr over 60 Minutes Intravenous Every 8 hours 03/08/16 0550 03/09/16 1548   03/07/16 1130  ciprofloxacin (CIPRO) IVPB 400 mg  Status:  Discontinued     400 mg 200 mL/hr over 60 Minutes Intravenous Every 12 hours 03/07/16 1125 03/10/16 1319   03/07/16 1130  metroNIDAZOLE (FLAGYL) IVPB 500 mg  Status:  Discontinued     500 mg 100 mL/hr over 60 Minutes Intravenous Every 8 hours 03/07/16 1125 03/10/16 1319   03/07/16  0530  ceFEPIme (MAXIPIME) 1 g in dextrose 5 % 50 mL IVPB     1 g 100 mL/hr over 30 Minutes Intravenous  Once 03/07/16 0516 03/07/16 0700   03/07/16 0530  metroNIDAZOLE (FLAGYL) IVPB 500 mg     500 mg 100 mL/hr over 60 Minutes Intravenous  Once 03/07/16 0516 03/07/16 0612      Assessment/Plan: s/p Procedure(s): LAPAROSCOPY DIAGNOSTIC (N/A) LAPAROSCOPIC EVACUATION OF HEMATOMA Possible bile leak after cholecystectomy  HIDA scan today NPO until after HIDA scan.   LOS: 7 days    Drew Prince K. 03/14/2016

## 2016-03-14 NOTE — Progress Notes (Signed)
Patient ID: Lana FishRiley Rung, male   DOB: 03-21-1976, 40 y.o.   MRN: [id number] I spoke with him about his HIDA results. I also spoke with GI and they are considering ERCP tomorrow. Violeta GelinasBurke Eniya Cannady, MD, MPH, FACS Trauma: [phone] General Surgery: [phone]

## 2016-03-14 NOTE — Clinical Documentation Improvement (Signed)
General Surgery  (Please document query responses in the current medical record, not on the CDI BPA form.  Thank you.)  To assist with accurate code assignment, please clarify if the patient:  - Has had Peritonitis this admission, improved or resolved  - Has Not had Peritonitis this admission, it was ruled out  - Other condition  - Unable to clinically determine  Clinical Information: "Patient continue with peritonitis signs, tachycardia, and thus patient was taken back to the operative for diagnostic laparoscopy" is documented in the dictated op note 03/09/16 "No evidence of peritonitis. No incarcerated hernias." documented in 03/09/16 at 12:44 pm cosigned by Dr. Derrell Lollingamirez   Please exercise your independent, professional judgment when responding. A specific answer is not anticipated or expected.   Thank You, Jerral Ralphathy R Marlon Suleiman  RN BSN CCDS [id number] Health Information Management [place]

## 2016-03-14 NOTE — Progress Notes (Signed)
Patient ID: Drew Prince [place], male   DOB: 1976-06-02, 40 y.o.   MRN: [id number] HIDA positive for bile leak in liver bed, ? Duct of Lushka. I will ask GI to F/U. Clears, NPO after MN. Violeta GelinasBurke Arvind Mexicano, MD, MPH, FACS Trauma: [phone] General Surgery: [phone]

## 2016-03-14 NOTE — Progress Notes (Signed)
[facility] Progress Note    Since last GI note: I am meeting him for the first time.  Briefly, severe acute pancreatitis s/b acute peripancreatic fluid collections 12/2015 seen in consultation by Dr. Hung.  Eventual d/c from that stay, he was seen in surgery office shortly afterwards and was set up for elective lap chole with IOC on 3/8 with Dr. Ramirez. That seemed to go well but 2-3 days later he represented to ER with severe abdominal pains. HIDA scan showed no biliary leak.  He underwent laparoscopic surgery again on 3/13 with evacuation of 300cc hematoma. A drain was placed in OR.  Since then he's was having sporadic abd pains.  LFTs have been normal but this morning the drain contained greenish bile.  A repeat HIDA scan was done and this one showed bile leak, see report below.  HIDA today: 1. New abnormal pooling of radiotracer within the cholecystectomy bed, consistent with a bile leak, potentially from the undersurface of the liver, although a common hepatic/bile duct or cystic duct stump origin cannot be excluded. 2. No scintigraphic evidence of biliary obstruction. 3. No scintigraphic evidence of hepatocellular dysfunction.  He is already on antibiotics (cipro/flagyl), not on blood thinners and was changed to clears earlier today, NPO after MN.  Objective: Vital signs in last 24 hours: Temp:  [98.4 F (36.9 C)-100 F (37.8 C)] 100 F (37.8 C) (03/18 1222) Pulse Rate:  [101-105] 105 (03/18 1222) Resp:  [17-18] 17 (03/18 1222) BP: (153-162)/(93-101) 157/101 mmHg (03/18 1222) SpO2:  [95 %-99 %] 96 % (03/18 1222) Weight:  [192 lb 3.9 oz (87.2 kg)] 192 lb 3.9 oz (87.2 kg) (03/18 0459) Last BM Date: 03/13/16 General: alert and oriented times 3 Heart: regular rate and rythm Abdomen: soft, non-tender, non-distended, normal bowel sounds JP drain in place: green liquid within  Lab Results:  Recent Labs  03/12/16 0430  WBC 12.9*  HGB 11.8*  PLT 310  MCV 77.8*     Recent Labs  03/12/16 0430 03/13/16 0420  NA 136 136  K 3.7 3.0*  CL 103 102  CO2 23 24  GLUCOSE 117* 130*  BUN 24* 18  CREATININE 2.94* 2.80*  CALCIUM 8.3* 8.1*    Recent Labs  03/13/16 1511  PROT 5.9*  ALBUMIN 2.4*  AST 22  ALT 23  ALKPHOS 72  BILITOT 0.6  BILIDIR 0.2  IBILI 0.4    Studies/Results: Nm Hepatobiliary Liver Func  03/14/2016  CLINICAL DATA:  39-year-old male status post laparoscopic cholecystectomy on 03/04/2016, complicated by cholecystectomy bed hematoma requiring laparoscopic drainage on 03/09/2016, with biliary appearing drainage from surgical drain. EXAM: NUCLEAR MEDICINE HEPATOBILIARY IMAGING TECHNIQUE: Sequential images of the abdomen were obtained out to 60 minutes following intravenous administration of radiopharmaceutical. RADIOPHARMACEUTICALS:  5.4 mCi Tc-99m  Choletec IV COMPARISON:  03/07/2016 hepatobiliary scintigraphy and CT abdomen/ pelvis . FINDINGS: There is normal blood pool clearance and radiotracer concentration by the liver. There is normal immediate excretion of radiotracer into the common bile duct, duodenum and proximal small bowel. There is new abnormal pooling of radiotracer within the cholecystectomy bed. IMPRESSION: 1. New abnormal pooling of radiotracer within the cholecystectomy bed, consistent with a bile leak, potentially from the undersurface of the liver, although a common hepatic/bile duct or cystic duct stump origin cannot be excluded. 2. No scintigraphic evidence of biliary obstruction. 3. No scintigraphic evidence of hepatocellular dysfunction. These results were called by telephone at the time of interpretation on 03/14/2016 at 12:58 pm to Dr. BURKE, who   verbally acknowledged these results. Electronically Signed   By: Jason A Poff M.D.   On: 03/14/2016 12:59    Medications: Scheduled Meds: . amLODipine  10 mg Oral Daily  . ciprofloxacin  500 mg Oral BID  . metroNIDAZOLE  500 mg Oral 3 times per day  . pantoprazole  40 mg  Oral Daily  . sodium chloride  1,000 mL Intravenous Once   Continuous Infusions:  PRN Meds:.acetaminophen, cyclobenzaprine, HYDROmorphone (DILAUDID) injection, HYDROmorphone (DILAUDID) injection, ondansetron **OR** ondansetron (ZOFRAN) IV, oxyCODONE-acetaminophen, promethazine   Assessment/Plan: 39 y.o. male with complicated pancreatic/biliary history over the past 2 months  Currently has HIDA confirmed bile leak and will need ERCP (stent vs sphincterotmy) to manage this.  I spoke with him and his wife and they seem to understand the plan. They understands risks (not limited to pancreatitis, perforation, bleeding) and want to proceed.  He is currently on schedule for anesthesia assisted (GA, in endo or OR) ERCP tomorrow at 9:30AM.      Jacobs, Daniel P, MD  03/14/2016, 1:54 PM [facility] Pager [phone]     

## 2016-03-15 ENCOUNTER — Encounter (HOSPITAL_COMMUNITY): Payer: Self-pay

## 2016-03-15 ENCOUNTER — Inpatient Hospital Stay (HOSPITAL_COMMUNITY): Payer: BLUE CROSS/BLUE SHIELD | Admitting: Anesthesiology

## 2016-03-15 ENCOUNTER — Encounter (HOSPITAL_COMMUNITY)
Admission: EM | Disposition: A | Discharge status: Home / Self Care | Payer: Self-pay | Source: Home / Self Care | Attending: General Surgery

## 2016-03-15 LAB — POCT I-STAT 4, (NA,K, GLUC, HGB,HCT)
GLUCOSE: 112 mg/dL — AB (ref 65–99)
HCT: 34 % — ABNORMAL LOW (ref 39.0–52.0)
Hemoglobin: 11.6 g/dL — ABNORMAL LOW (ref 13.0–17.0)
POTASSIUM: 2.8 mmol/L — AB (ref 3.5–5.1)
Sodium: 141 mmol/L (ref 135–145)

## 2016-03-15 SURGERY — ERCP, WITH INTERVENTION IF INDICATED
Anesthesia: General

## 2016-03-15 MED ORDER — FENTANYL CITRATE (PF) 250 MCG/5ML IJ SOLN
INTRAMUSCULAR | Status: AC
  Filled 2016-03-15: qty 5

## 2016-03-15 MED ORDER — MIDAZOLAM HCL 2 MG/2ML IJ SOLN
INTRAMUSCULAR | Status: AC
  Filled 2016-03-15: qty 2

## 2016-03-15 MED ORDER — POTASSIUM CHLORIDE CRYS ER 20 MEQ PO TBCR
40.0000 meq | EXTENDED_RELEASE_TABLET | Freq: Once | ORAL | Status: AC
  Administered 2016-03-15: 40 meq via ORAL
  Filled 2016-03-15: qty 2

## 2016-03-15 MED ORDER — POTASSIUM CHLORIDE 10 MEQ/100ML IV SOLN
10.0000 meq | INTRAVENOUS | Status: AC
  Administered 2016-03-15 (×4): 10 meq via INTRAVENOUS
  Filled 2016-03-15 (×4): qty 100

## 2016-03-15 MED ORDER — LACTATED RINGERS IV SOLN
INTRAVENOUS | Status: DC | PRN
  Administered 2016-03-15: 10:00:00 via INTRAVENOUS

## 2016-03-15 NOTE — Care Management Note (Signed)
Case Management Note  Patient Details  Name: Drew FishRiley Prince MRN: [id number] Date of Birth: 1976/01/23  Subjective/Objective:                  ENDOSCOPIC RETROGRADE CHOLANGIOPANCREATOGRAPHY (ERCP)  Action/Plan: CM spoke with patient at the bedside. States he has a PCP he stopped seeing due to an issue with getting his prescriptions refilled while he was non-weight bearing. Encouraged to call the telephone number on the back of his [employer] card for a list of in-network providers tomorrow. Informed the telephone number for [facility] is listed on his discharge summary. Explained he can call [facility] for a list of providers. Encouraged to call tomorrow to ensure he is able to schedule a follow-up appointment in one week per the physician's instructions. Patient agrees.   Expected Discharge Date:                  Expected Discharge Plan:  Home/Self Care  In-House Referral:     Discharge planning Services  CM Consult  Post Acute Care Choice:    Choice offered to:     DME Arranged:    DME Agency:  NA  HH Arranged:  NA HH Agency:  NA  Status of Service:  Completed, signed off  Medicare Important Message Given:    Date Medicare IM Given:    Medicare IM give by:    Date Additional Medicare IM Given:    Additional Medicare Important Message give by:     If discussed at Long Length of Stay Meetings, dates discussed:    Additional Comments:  Antony HasteBennett, Missi Mcmackin Harris, RN 03/15/2016, 3:14 PM

## 2016-03-15 NOTE — Interval H&P Note (Signed)
History and Physical Interval Note:  03/15/2016 9:15 AM  Drew Prince  has presented today for surgery, with the diagnosis of bile leak  The various methods of treatment have been discussed with the patient and family. After consideration of risks, benefits and other options for treatment, the patient has consented to  Procedure(s): ENDOSCOPIC RETROGRADE CHOLANGIOPANCREATOGRAPHY (ERCP) (N/A) as a surgical intervention .  The patient's history has been reviewed, patient examined, no change in status, stable for surgery.  I have reviewed the patient's chart and labs.  Questions were answered to the patient's satisfaction.     Rachael FeeJacobs, Jeanita Carneiro P

## 2016-03-15 NOTE — H&P (View-Only) (Signed)
[facility] Progress Note    Since last GI note: I am meeting him for the first time.  Briefly, severe acute pancreatitis s/b acute peripancreatic fluid collections 12/2015 seen in consultation by Dr. Elnoria HowardHung.  Eventual d/c from that stay, he was seen in surgery office shortly afterwards and was set up for elective lap chole with IOC on 3/8 with Dr. Derrell Lollingamirez. That seemed to go well but 2-3 days later he represented to ER with severe abdominal pains. HIDA scan showed no biliary leak.  He underwent laparoscopic surgery again on 3/13 with evacuation of 300cc hematoma. A drain was placed in OR.  Since then he's was having sporadic abd pains.  LFTs have been normal but this morning the drain contained greenish bile.  A repeat HIDA scan was done and this one showed bile leak, see report below.  HIDA today: 1. New abnormal pooling of radiotracer within the cholecystectomy bed, consistent with a bile leak, potentially from the undersurface of the liver, although a common hepatic/bile duct or cystic duct stump origin cannot be excluded. 2. No scintigraphic evidence of biliary obstruction. 3. No scintigraphic evidence of hepatocellular dysfunction.  He is already on antibiotics (cipro/flagyl), not on blood thinners and was changed to clears earlier today, NPO after MN.  Objective: Vital signs in last 24 hours: Temp:  [98.4 F (36.9 C)-100 F (37.8 C)] 100 F (37.8 C) (03/18 1222) Pulse Rate:  [101-105] 105 (03/18 1222) Resp:  [17-18] 17 (03/18 1222) BP: (153-162)/(93-101) 157/101 mmHg (03/18 1222) SpO2:  [95 %-99 %] 96 % (03/18 1222) Weight:  [192 lb 3.9 oz (87.2 kg)] 192 lb 3.9 oz (87.2 kg) (03/18 0459) Last BM Date: 03/13/16 General: alert and oriented times 3 Heart: regular rate and rythm Abdomen: soft, non-tender, non-distended, normal bowel sounds JP drain in place: green liquid within  Lab Results:  Recent Labs  03/12/16 0430  WBC 12.9*  HGB 11.8*  PLT 310  MCV 77.8*     Recent Labs  03/12/16 0430 03/13/16 0420  NA 136 136  K 3.7 3.0*  CL 103 102  CO2 23 24  GLUCOSE 117* 130*  BUN 24* 18  CREATININE 2.94* 2.80*  CALCIUM 8.3* 8.1*    Recent Labs  03/13/16 1511  PROT 5.9*  ALBUMIN 2.4*  AST 22  ALT 23  ALKPHOS 72  BILITOT 0.6  BILIDIR 0.2  IBILI 0.4    Studies/Results: Nm Hepatobiliary Liver Func  03/14/2016  CLINICAL DATA:  40 year old male status post laparoscopic cholecystectomy on 03/04/2016, complicated by cholecystectomy bed hematoma requiring laparoscopic drainage on 03/09/2016, with biliary appearing drainage from surgical drain. EXAM: NUCLEAR MEDICINE HEPATOBILIARY IMAGING TECHNIQUE: Sequential images of the abdomen were obtained out to 60 minutes following intravenous administration of radiopharmaceutical. RADIOPHARMACEUTICALS:  5.4 mCi Tc-2160m  Choletec IV COMPARISON:  03/07/2016 hepatobiliary scintigraphy and CT abdomen/ pelvis . FINDINGS: There is normal blood pool clearance and radiotracer concentration by the liver. There is normal immediate excretion of radiotracer into the common bile duct, duodenum and proximal small bowel. There is new abnormal pooling of radiotracer within the cholecystectomy bed. IMPRESSION: 1. New abnormal pooling of radiotracer within the cholecystectomy bed, consistent with a bile leak, potentially from the undersurface of the liver, although a common hepatic/bile duct or cystic duct stump origin cannot be excluded. 2. No scintigraphic evidence of biliary obstruction. 3. No scintigraphic evidence of hepatocellular dysfunction. These results were called by telephone at the time of interpretation on 03/14/2016 at 12:58 pm to Dr. Laurell JosephsBURKE, who  verbally acknowledged these results. Electronically Signed   By: Delbert Phenix M.D.   On: 03/14/2016 12:59    Medications: Scheduled Meds: . amLODipine  10 mg Oral Daily  . ciprofloxacin  500 mg Oral BID  . metroNIDAZOLE  500 mg Oral 3 times per day  . pantoprazole  40 mg  Oral Daily  . sodium chloride  1,000 mL Intravenous Once   Continuous Infusions:  PRN Meds:.acetaminophen, cyclobenzaprine, HYDROmorphone (DILAUDID) injection, HYDROmorphone (DILAUDID) injection, ondansetron **OR** ondansetron (ZOFRAN) IV, oxyCODONE-acetaminophen, promethazine   Assessment/Plan: 40 y.o. male with complicated pancreatic/biliary history over the past 2 months  Currently has HIDA confirmed bile leak and will need ERCP (stent vs sphincterotmy) to manage this.  I spoke with him and his wife and they seem to understand the plan. They understands risks (not limited to pancreatitis, perforation, bleeding) and want to proceed.  He is currently on schedule for anesthesia assisted (GA, in endo or OR) ERCP tomorrow at 9:30AM.      Rachael Fee, MD  03/14/2016, 1:54 PM [facility] Pager [phone]

## 2016-03-15 NOTE — Progress Notes (Signed)
6 Days Post-Op  Subjective: Still with some RUQ pain  Objective: Vital signs in last 24 hours: Temp:  [98.4 F (36.9 C)-100 F (37.8 C)] 98.4 F (36.9 C) (03/19 0641) Pulse Rate:  [99-110] 99 (03/19 0641) Resp:  [17-18] 18 (03/19 0641) BP: (137-157)/(82-101) 149/82 mmHg (03/19 0641) SpO2:  [95 %-96 %] 95 % (03/19 0641) Weight:  [84.1 kg (185 lb 6.5 oz)] 84.1 kg (185 lb 6.5 oz) (03/19 0641) Last BM Date: 03/14/16  Intake/Output from previous day: 03/18 0701 - 03/19 0700 In: 1820 [P.O.:1820] Out: 4285 [Urine:4100; Drains:185] Intake/Output this shift:   Abdomen with mild RUQ tenderness Drain with bile  Lab Results:  No results for input(s): WBC, HGB, HCT, PLT in the last 72 hours. BMET  Recent Labs  03/13/16 0420  NA 136  K 3.0*  CL 102  CO2 24  GLUCOSE 130*  BUN 18  CREATININE 2.80*  CALCIUM 8.1*   PT/INR No results for input(s): LABPROT, INR in the last 72 hours. ABG No results for input(s): PHART, HCO3 in the last 72 hours.  Invalid input(s): PCO2, PO2  Studies/Results: Nm Hepatobiliary Liver Func  03/14/2016  CLINICAL DATA:  40 year old male status post laparoscopic cholecystectomy on 03/04/2016, complicated by cholecystectomy bed hematoma requiring laparoscopic drainage on 03/09/2016, with biliary appearing drainage from surgical drain. EXAM: NUCLEAR MEDICINE HEPATOBILIARY IMAGING TECHNIQUE: Sequential images of the abdomen were obtained out to 60 minutes following intravenous administration of radiopharmaceutical. RADIOPHARMACEUTICALS:  5.4 mCi Tc-24m  Choletec IV COMPARISON:  03/07/2016 hepatobiliary scintigraphy and CT abdomen/ pelvis . FINDINGS: There is normal blood pool clearance and radiotracer concentration by the liver. There is normal immediate excretion of radiotracer into the common bile duct, duodenum and proximal small bowel. There is new abnormal pooling of radiotracer within the cholecystectomy bed. IMPRESSION: 1. New abnormal pooling of  radiotracer within the cholecystectomy bed, consistent with a bile leak, potentially from the undersurface of the liver, although a common hepatic/bile duct or cystic duct stump origin cannot be excluded. 2. No scintigraphic evidence of biliary obstruction. 3. No scintigraphic evidence of hepatocellular dysfunction. These results were called by telephone at the time of interpretation on 03/14/2016 at 12:58 pm to Dr. Laurell Josephs, who verbally acknowledged these results. Electronically Signed   By: Delbert Phenix M.D.   On: 03/14/2016 12:59    Anti-infectives: Anti-infectives    Start     Dose/Rate Route Frequency Ordered Stop   03/13/16 1400  metroNIDAZOLE (FLAGYL) tablet 500 mg     500 mg Oral 3 times per day 03/13/16 1053     03/13/16 1100  ciprofloxacin (CIPRO) tablet 500 mg     500 mg Oral 2 times daily 03/13/16 1053     03/11/16 1100  ciprofloxacin (CIPRO) IVPB 400 mg  Status:  Discontinued     400 mg 200 mL/hr over 60 Minutes Intravenous Every 24 hours 03/10/16 1319 03/13/16 1053   03/10/16 1930  metroNIDAZOLE (FLAGYL) IVPB 500 mg  Status:  Discontinued     500 mg 100 mL/hr over 60 Minutes Intravenous Every 8 hours 03/10/16 1319 03/13/16 1053   03/09/16 1200  metroNIDAZOLE (FLAGYL) IVPB 500 mg  Status:  Discontinued     500 mg 100 mL/hr over 60 Minutes Intravenous To Surgery 03/09/16 1145 03/09/16 1543   03/08/16 0600  vancomycin (VANCOCIN) IVPB 1000 mg/200 mL premix  Status:  Discontinued     1,000 mg 200 mL/hr over 60 Minutes Intravenous Every 8 hours 03/08/16 0550 03/09/16 1548   03/07/16  1130  ciprofloxacin (CIPRO) IVPB 400 mg  Status:  Discontinued     400 mg 200 mL/hr over 60 Minutes Intravenous Every 12 hours 03/07/16 1125 03/10/16 1319   03/07/16 1130  metroNIDAZOLE (FLAGYL) IVPB 500 mg  Status:  Discontinued     500 mg 100 mL/hr over 60 Minutes Intravenous Every 8 hours 03/07/16 1125 03/10/16 1319   03/07/16 0530  ceFEPIme (MAXIPIME) 1 g in dextrose 5 % 50 mL IVPB     1 g 100 mL/hr  over 30 Minutes Intravenous  Once 03/07/16 0516 03/07/16 0700   03/07/16 0530  metroNIDAZOLE (FLAGYL) IVPB 500 mg     500 mg 100 mL/hr over 60 Minutes Intravenous  Once 03/07/16 0516 03/07/16 0612      Assessment/Plan: s/p Procedure(s): LAPAROSCOPY DIAGNOSTIC (N/A) LAPAROSCOPIC EVACUATION OF HEMATOMA  For ERCP and stent today for bile leak Resume diet after the procedure  LOS: 8 days    Shaletta Hinostroza A 03/15/2016

## 2016-03-15 NOTE — Progress Notes (Signed)
In endoscopy pre-op, his K was found to be 2.8 and anesthesia reccommended he receive 40mg K prior to procedure, would take at least 4 hours (perhaps longer).   I am going to let him resume diet, NPO after MN and we'll put on for schedule for ERCP tomorrow with Dr. Stark. 

## 2016-03-15 NOTE — Anesthesia Preprocedure Evaluation (Addendum)
Anesthesia Evaluation  Patient identified by MRN, date of birth, ID band Patient awake    Reviewed: Allergy & Precautions, H&P , NPO status , Patient's Chart, lab work & pertinent test results  Airway Mallampati: II  TM Distance: >3 FB Neck ROM: Full    Dental no notable dental hx. (+) Teeth Intact, Dental Advisory Given   Pulmonary neg pulmonary ROS,    Pulmonary exam normal breath sounds clear to auscultation       Cardiovascular hypertension,  Rhythm:Regular Rate:Normal     Neuro/Psych Depression negative neurological ROS     GI/Hepatic Neg liver ROS, GERD  Medicated,Acute pancreatitis   Endo/Other  negative endocrine ROS  Renal/GU negative Renal ROS  negative genitourinary   Musculoskeletal   Abdominal   Peds  Hematology negative hematology ROS (+)   Anesthesia Other Findings   Reproductive/Obstetrics negative OB ROS                            Anesthesia Physical Anesthesia Plan  ASA: III  Anesthesia Plan: General   Post-op Pain Management:    Induction: Intravenous  Airway Management Planned: Oral ETT  Additional Equipment:   Intra-op Plan:   Post-operative Plan: Extubation in OR  Informed Consent: I have reviewed the patients History and Physical, chart, labs and discussed the procedure including the risks, benefits and alternatives for the proposed anesthesia with the patient or authorized representative who has indicated his/her understanding and acceptance.   Dental advisory given  Plan Discussed with: CRNA  Anesthesia Plan Comments:         Anesthesia Quick Evaluation

## 2016-03-16 ENCOUNTER — Encounter (HOSPITAL_COMMUNITY)
Admission: EM | Disposition: A | Discharge status: Home / Self Care | Payer: Self-pay | Source: Home / Self Care | Attending: General Surgery

## 2016-03-16 ENCOUNTER — Inpatient Hospital Stay (HOSPITAL_COMMUNITY): Payer: BLUE CROSS/BLUE SHIELD | Admitting: Anesthesiology

## 2016-03-16 ENCOUNTER — Inpatient Hospital Stay (HOSPITAL_COMMUNITY): Payer: BLUE CROSS/BLUE SHIELD

## 2016-03-16 ENCOUNTER — Encounter (HOSPITAL_COMMUNITY): Payer: Self-pay | Admitting: *Deleted

## 2016-03-16 DIAGNOSIS — K839 Disease of biliary tract, unspecified: Secondary | ICD-10-CM

## 2016-03-16 DIAGNOSIS — R948 Abnormal results of function studies of other organs and systems: Secondary | ICD-10-CM

## 2016-03-16 DIAGNOSIS — R1012 Left upper quadrant pain: Secondary | ICD-10-CM

## 2016-03-16 DIAGNOSIS — R1011 Right upper quadrant pain: Secondary | ICD-10-CM

## 2016-03-16 DIAGNOSIS — R1084 Generalized abdominal pain: Secondary | ICD-10-CM

## 2016-03-16 DIAGNOSIS — G8929 Other chronic pain: Secondary | ICD-10-CM | POA: Insufficient documentation

## 2016-03-16 HISTORY — PX: ERCP: SHX5425

## 2016-03-16 LAB — BASIC METABOLIC PANEL
ANION GAP: 15 (ref 5–15)
BUN: 10 mg/dL (ref 6–20)
CHLORIDE: 101 mmol/L (ref 101–111)
CO2: 25 mmol/L (ref 22–32)
Calcium: 8.1 mg/dL — ABNORMAL LOW (ref 8.9–10.3)
Creatinine, Ser: 2.25 mg/dL — ABNORMAL HIGH (ref 0.61–1.24)
GFR calc Af Amer: 40 mL/min — ABNORMAL LOW (ref >60)
GFR calc non Af Amer: 35 mL/min — ABNORMAL LOW (ref >60)
GLUCOSE: 90 mg/dL (ref 65–99)
POTASSIUM: 3.2 mmol/L — AB (ref 3.5–5.1)
Sodium: 141 mmol/L (ref 135–145)

## 2016-03-16 SURGERY — ERCP, WITH INTERVENTION IF INDICATED
Anesthesia: General

## 2016-03-16 SURGERY — ERCP, WITH INTERVENTION IF INDICATED
Anesthesia: Choice

## 2016-03-16 MED ORDER — INDOMETHACIN 50 MG RE SUPP
100.0000 mg | Freq: Once | RECTAL | Status: AC
  Administered 2016-03-16: 100 mg via RECTAL

## 2016-03-16 MED ORDER — PROPOFOL 10 MG/ML IV BOLUS
INTRAVENOUS | Status: DC | PRN
  Administered 2016-03-16: 160 mg via INTRAVENOUS

## 2016-03-16 MED ORDER — FENTANYL CITRATE (PF) 250 MCG/5ML IJ SOLN
INTRAMUSCULAR | Status: AC
  Filled 2016-03-16: qty 5

## 2016-03-16 MED ORDER — FENTANYL CITRATE (PF) 100 MCG/2ML IJ SOLN
INTRAMUSCULAR | Status: DC | PRN
  Administered 2016-03-16: 75 ug via INTRAVENOUS

## 2016-03-16 MED ORDER — LIDOCAINE HCL 4 % EX SOLN
CUTANEOUS | Status: DC | PRN
  Administered 2016-03-16: 2 mL via TOPICAL

## 2016-03-16 MED ORDER — SUCCINYLCHOLINE CHLORIDE 20 MG/ML IJ SOLN
INTRAMUSCULAR | Status: DC | PRN
  Administered 2016-03-16: 100 mg via INTRAVENOUS

## 2016-03-16 MED ORDER — INDOMETHACIN 50 MG RE SUPP
RECTAL | Status: AC
  Filled 2016-03-16: qty 1

## 2016-03-16 MED ORDER — LIDOCAINE HCL (CARDIAC) 20 MG/ML IV SOLN
INTRAVENOUS | Status: DC | PRN
  Administered 2016-03-16: 60 mg via INTRAVENOUS

## 2016-03-16 MED ORDER — IOHEXOL 350 MG/ML SOLN
INTRAVENOUS | Status: DC | PRN
  Administered 2016-03-16: 25 mL

## 2016-03-16 MED ORDER — LACTATED RINGERS IV SOLN
INTRAVENOUS | Status: DC | PRN
  Administered 2016-03-16: 12:00:00 via INTRAVENOUS

## 2016-03-16 MED ORDER — MIDAZOLAM HCL 2 MG/2ML IJ SOLN
INTRAMUSCULAR | Status: AC
  Filled 2016-03-16: qty 2

## 2016-03-16 MED ORDER — SODIUM CHLORIDE 0.9 % IV SOLN: INTRAVENOUS | Status: DC

## 2016-03-16 MED ORDER — ONDANSETRON HCL 4 MG/2ML IJ SOLN
INTRAMUSCULAR | Status: DC | PRN
  Administered 2016-03-16: 4 mg via INTRAVENOUS

## 2016-03-16 MED ORDER — ARTIFICIAL TEARS OP OINT
TOPICAL_OINTMENT | OPHTHALMIC | Status: DC | PRN
Start: 2016-03-16 — End: 2016-03-16
  Administered 2016-03-16: 1 via OPHTHALMIC

## 2016-03-16 MED ORDER — PHENYLEPHRINE HCL 10 MG/ML IJ SOLN
INTRAMUSCULAR | Status: DC | PRN
  Administered 2016-03-16 (×4): 40 ug via INTRAVENOUS

## 2016-03-16 MED ORDER — GLUCAGON HCL RDNA (DIAGNOSTIC) 1 MG IJ SOLR
INTRAMUSCULAR | Status: DC | PRN
  Administered 2016-03-16: 0.25 mg via INTRAVENOUS

## 2016-03-16 MED ORDER — GLUCAGON HCL RDNA (DIAGNOSTIC) 1 MG IJ SOLR
INTRAMUSCULAR | Status: AC
  Filled 2016-03-16: qty 1

## 2016-03-16 MED ORDER — MIDAZOLAM HCL 5 MG/5ML IJ SOLN
INTRAMUSCULAR | Status: DC | PRN
  Administered 2016-03-16: 1 mg via INTRAVENOUS

## 2016-03-16 MED ORDER — INDOMETHACIN 50 MG RE SUPP
RECTAL | Status: AC
Start: 2016-03-16 — End: 2016-03-16
  Filled 2016-03-16: qty 2

## 2016-03-16 NOTE — Interval H&P Note (Signed)
History and Physical Interval Note:  03/16/2016 12:39 PM  Drew Prince [place]  has presented today for surgery, with the diagnosis of bile duct leak  The various methods of treatment have been discussed with the patient and family. After consideration of risks, benefits and other options for treatment, the patient has consented to  Procedure(s): ENDOSCOPIC RETROGRADE CHOLANGIOPANCREATOGRAPHY (ERCP) (N/A) as a surgical intervention .  The patient's history has been reviewed, patient examined, no change in status, stable for surgery.  I have reviewed the patient's chart and labs.  Questions were answered to the patient's satisfaction.     Venita LickMalcolm T. Russella DarStark

## 2016-03-16 NOTE — Anesthesia Preprocedure Evaluation (Addendum)
Anesthesia Evaluation  Patient identified by MRN, date of birth, ID band  Reviewed: Allergy & Precautions, NPO status , Patient's Chart, lab work & pertinent test results  Airway Mallampati: II  TM Distance: >3 FB Neck ROM: Full    Dental  (+) Teeth Intact, Dental Advisory Given   Pulmonary    breath sounds clear to auscultation       Cardiovascular hypertension, Pt. on medications  Rhythm:Regular Rate:Normal     Neuro/Psych    GI/Hepatic GERD  ,  Endo/Other    Renal/GU      Musculoskeletal   Abdominal   Peds  Hematology   Anesthesia Other Findings S/p Lap Chole 3/8 with laparoscopic exploration for pain on 3/13.  Suspected bile leak now for ERCP.  Reproductive/Obstetrics                            Anesthesia Physical Anesthesia Plan  ASA: II  Anesthesia Plan: General   Post-op Pain Management:    Induction: Intravenous  Airway Management Planned: Oral ETT  Additional Equipment:   Intra-op Plan:   Post-operative Plan: Extubation in OR  Informed Consent: I have reviewed the patients History and Physical, chart, labs and discussed the procedure including the risks, benefits and alternatives for the proposed anesthesia with the patient or authorized representative who has indicated his/her understanding and acceptance.   Dental advisory given  Plan Discussed with: CRNA and Surgeon  Anesthesia Plan Comments:         Anesthesia Quick Evaluation                                  Anesthesia Evaluation  Patient identified by MRN, date of birth, ID band Patient awake    Reviewed: Allergy & Precautions, H&P , NPO status , Patient's Chart, lab work & pertinent test results  Airway Mallampati: II  TM Distance: >3 FB Neck ROM: Full    Dental no notable dental hx. (+) Teeth Intact, Dental Advisory Given   Pulmonary neg pulmonary ROS,    Pulmonary exam normal breath  sounds clear to auscultation       Cardiovascular hypertension,  Rhythm:Regular Rate:Normal     Neuro/Psych Depression negative neurological ROS     GI/Hepatic Neg liver ROS, GERD  Medicated,Acute pancreatitis   Endo/Other  negative endocrine ROS  Renal/GU negative Renal ROS  negative genitourinary   Musculoskeletal   Abdominal   Peds  Hematology negative hematology ROS (+)   Anesthesia Other Findings   Reproductive/Obstetrics negative OB ROS                            Anesthesia Physical Anesthesia Plan  ASA: III  Anesthesia Plan: General   Post-op Pain Management:    Induction: Intravenous  Airway Management Planned: Oral ETT  Additional Equipment:   Intra-op Plan:   Post-operative Plan: Extubation in OR  Informed Consent: I have reviewed the patients History and Physical, chart, labs and discussed the procedure including the risks, benefits and alternatives for the proposed anesthesia with the patient or authorized representative who has indicated his/her understanding and acceptance.   Dental advisory given  Plan Discussed with: CRNA  Anesthesia Plan Comments:         Anesthesia Quick Evaluation

## 2016-03-16 NOTE — Anesthesia Procedure Notes (Signed)
Procedure Name: Intubation Date/Time: 03/16/2016 12:10 PM Performed by: Suzy Bouchard Pre-anesthesia Checklist: Patient identified, Timeout performed, Emergency Drugs available, Patient being monitored and Suction available Patient Re-evaluated:Patient Re-evaluated prior to inductionOxygen Delivery Method: Circle system utilized Preoxygenation: Pre-oxygenation with 100% oxygen Intubation Type: IV induction Ventilation: Mask ventilation without difficulty Laryngoscope Size: Miller and 2 Grade View: Grade I Tube size: 7.5 mm Number of attempts: 1 Airway Equipment and Method: Stylet and LTA kit utilized Placement Confirmation: ETT inserted through vocal cords under direct vision,  positive ETCO2 and CO2 detector Secured at: 22 cm Tube secured with: Tape Dental Injury: Teeth and Oropharynx as per pre-operative assessment

## 2016-03-16 NOTE — Anesthesia Postprocedure Evaluation (Signed)
Anesthesia Post Note  Patient: Lana FishRiley Bunten  Procedure(s) Performed: Procedure(s) (LRB): ENDOSCOPIC RETROGRADE CHOLANGIOPANCREATOGRAPHY (ERCP) (N/A)  Patient location during evaluation: PACU Anesthesia Type: General Level of consciousness: awake and alert Pain management: pain level controlled Vital Signs Assessment: post-procedure vital signs reviewed and stable Respiratory status: spontaneous breathing, nonlabored ventilation, respiratory function stable and patient connected to nasal cannula oxygen Cardiovascular status: blood pressure returned to baseline and stable Postop Assessment: no signs of nausea or vomiting Anesthetic complications: no    Last Vitals:  Filed Vitals:   03/16/16 1411 03/16/16 1426  BP: 159/101 149/90  Pulse: 107 105  Temp:  36.7 C  Resp: 21 21    Last Pain:  Filed Vitals:   03/16/16 1427  PainSc: Asleep                 Jhamal Plucinski,JAMES TERRILL

## 2016-03-16 NOTE — Transfer of Care (Signed)
Immediate Anesthesia Transfer of Care Note  Patient: Drew Prince  Procedure(s) Performed: Procedure(s): ENDOSCOPIC RETROGRADE CHOLANGIOPANCREATOGRAPHY (ERCP) (N/A)  Patient Location: Endoscopy Unit  Anesthesia Type:General  Level of Consciousness: sedated  Airway & Oxygen Therapy: Patient Spontanous Breathing and Patient connected to nasal cannula oxygen  Post-op Assessment: Report given to RN, Post -op Vital signs reviewed and stable and Patient moving all extremities  Post vital signs: Reviewed and stable  Last Vitals:  Filed Vitals:   03/16/16 1324 03/16/16 1325  BP: 160/102   Pulse: 110   Temp:  36.9 C  Resp: 18     Complications: No apparent anesthesia complications

## 2016-03-16 NOTE — Op Note (Signed)
[facility] Patient Name: Drew FishRiley Yingling Procedure Date : 03/16/2016 MRN: [id number] Attending MD: Meryl DareMalcolm T Ceara Wrightson , MD Date of Birth: [birth year] CSN: [id number] Age: [age] Admit Type: Inpatient Procedure:                ERCP Indications:              Abdominal pain of suspected biliary origin,                            Abnormal hepatobiliary scintigraphy, Bile leak Providers:                Venita LickMalcolm T. Russella DarStark, MD, Dwain SarnaPatricia Ford, RN, Beryle BeamsJanie                            Billups, Technician Referring MD:              Medicines:                General Anesthesia Complications:            No immediate complications. Estimated Blood Loss:     Estimated blood loss: none. Procedure:                Pre-Anesthesia Assessment:                           - Prior to the procedure, a History and Physical                            was performed, and patient medications and                            allergies were reviewed. The patient's tolerance of                            previous anesthesia was also reviewed. The risks                            and benefits of the procedure and the sedation                            options and risks were discussed with the patient.                            All questions were answered, and informed consent                            was obtained. Prior Anticoagulants: The patient has                            taken no previous anticoagulant or antiplatelet                            agents. ASA Grade Assessment: II - A patient with  mild systemic disease. After reviewing the risks                            and benefits, the patient was deemed in                            satisfactory condition to undergo the procedure.                           After obtaining informed consent, the scope was                            passed under direct vision. Throughout the                            procedure, the patient's blood  pressure, pulse, and                            oxygen saturations were monitored continuously. The                            [id number]) scope was introduced through                            the mouth, and used to inject contrast into and                            used to inject contrast into the bile duct. The                            ERCP was accomplished without difficulty. The                            patient tolerated the procedure well. Scope In: Scope Out: Findings:      The scout film was normal. The esophagus was successfully intubated       under direct vision. The scope was advanced to a normal major papilla in       the descending duodenum without detailed examination of the pharynx,       larynx and associated structures, and upper GI tract. The upper GI tract       was grossly normal. The major papilla appeared normal and was located       within a large diverticulum. The bile duct was deeply cannulated with       the traction (standard) sphincterotome. Contrast was injected and       adequately filled the biliary tree. There was brisk flow of contrast       through the ducts. Image quality was adequate. Contrast extended to the       hepatic ducts. Extravasation of contrast at the gallbladder fossa was       noted. The exact site of extravasation was not clear. The biliary tree       otherwise appeared normal, postcholecystectomy. One 8.5 Fr by 5 cm       plastic stent with two external flaps was placed into the common bile  duct. Bile flowed through the stent. The stent was in good position. The       PD was not cannulated of injected by intention. Impression:               - A bile leak was found.                           - Periampullary diverticulum                           - Otherwise normal post-cholecystectomy                            cholangiogram Moderate Sedation:      none Recommendation:           - Return patient to hospital ward for  ongoing care. Procedure Code(s):        --- Professional ---                           [phone], Endoscopic retrograde                            cholangiopancreatography (ERCP); with placement of                            endoscopic stent into biliary or pancreatic duct,                            including pre- and post-dilation and guide wire                            passage, when performed, including sphincterotomy,                            when performed, each stent                           [id number], Endoscopic catheterization of the biliary                            ductal system, radiological supervision and                            interpretation Diagnosis Code(s):        --- Professional ---                           R10.9, Unspecified abdominal pain                           K83.8, Other specified diseases of biliary tract                           R94.5, Abnormal results of liver function studies CPT copyright 2016 [employer] Association. All rights reserved. The codes documented in this report are preliminary and upon coder review may  be revised to meet current compliance requirements. Venita Lick.  Russella Dar, MD Meryl Dare, MD 03/16/2016 1:09:23 PM This report has been signed electronically. Number of Addenda: 0

## 2016-03-16 NOTE — Progress Notes (Signed)
1 Day Post-Op  Subjective: Pt con't with sporadic abd pain  Objective: Vital signs in last 24 hours: Temp:  [98.1 F (36.7 C)-99.2 F (37.3 C)] 99.2 F (37.3 C) (03/20 0629) Pulse Rate:  [94-102] 102 (03/20 0629) Resp:  [18-19] 18 (03/20 0629) BP: (149-160)/(82-90) 160/88 mmHg (03/20 0629) SpO2:  [95 %-99 %] 96 % (03/20 0629) Weight:  [84.1 kg (185 lb 6.5 oz)-84.3 kg (185 lb 13.6 oz)] 84.3 kg (185 lb 13.6 oz) (03/20 0629) Last BM Date: 03/14/16  Intake/Output from previous day: 03/19 0701 - 03/20 0700 In: 1820 [P.O.:720; I.V.:700; IV Piggyback:400] Out: 1850 [Urine:1800; Drains:50] Intake/Output this shift: Total I/O In: 360 [P.O.:360] Out: 800 [Urine:800]  General appearance: alert and cooperative Resp: clear to auscultation bilaterally Cardio: regular rate and rhythm, S1, S2 normal, no murmur, click, rub or gallop GI: soft, gen ttp  Lab Results:   Recent Labs  03/15/16 0937  HGB 11.6*  HCT 34.0*   BMET  Recent Labs  03/15/16 0937  NA 141  K 2.8*  GLUCOSE 112*   PT/INR No results for input(s): LABPROT, INR in the last 72 hours. ABG No results for input(s): PHART, HCO3 in the last 72 hours.  Invalid input(s): PCO2, PO2  Studies/Results: Nm Hepatobiliary Liver Func  03/14/2016  CLINICAL DATA:  40 year old male status post laparoscopic cholecystectomy on 03/04/2016, complicated by cholecystectomy bed hematoma requiring laparoscopic drainage on 03/09/2016, with biliary appearing drainage from surgical drain. EXAM: NUCLEAR MEDICINE HEPATOBILIARY IMAGING TECHNIQUE: Sequential images of the abdomen were obtained out to 60 minutes following intravenous administration of radiopharmaceutical. RADIOPHARMACEUTICALS:  5.4 mCi Tc-49m  Choletec IV COMPARISON:  03/07/2016 hepatobiliary scintigraphy and CT abdomen/ pelvis . FINDINGS: There is normal blood pool clearance and radiotracer concentration by the liver. There is normal immediate excretion of radiotracer into the  common bile duct, duodenum and proximal small bowel. There is new abnormal pooling of radiotracer within the cholecystectomy bed. IMPRESSION: 1. New abnormal pooling of radiotracer within the cholecystectomy bed, consistent with a bile leak, potentially from the undersurface of the liver, although a common hepatic/bile duct or cystic duct stump origin cannot be excluded. 2. No scintigraphic evidence of biliary obstruction. 3. No scintigraphic evidence of hepatocellular dysfunction. These results were called by telephone at the time of interpretation on 03/14/2016 at 12:58 pm to Dr. Laurell Josephs, who verbally acknowledged these results. Electronically Signed   By: Delbert Phenix M.D.   On: 03/14/2016 12:59    Anti-infectives: Anti-infectives    Start     Dose/Rate Route Frequency Ordered Stop   03/13/16 1400  metroNIDAZOLE (FLAGYL) tablet 500 mg     500 mg Oral 3 times per day 03/13/16 1053     03/13/16 1100  ciprofloxacin (CIPRO) tablet 500 mg     500 mg Oral 2 times daily 03/13/16 1053     03/11/16 1100  ciprofloxacin (CIPRO) IVPB 400 mg  Status:  Discontinued     400 mg 200 mL/hr over 60 Minutes Intravenous Every 24 hours 03/10/16 1319 03/13/16 1053   03/10/16 1930  metroNIDAZOLE (FLAGYL) IVPB 500 mg  Status:  Discontinued     500 mg 100 mL/hr over 60 Minutes Intravenous Every 8 hours 03/10/16 1319 03/13/16 1053   03/09/16 1200  metroNIDAZOLE (FLAGYL) IVPB 500 mg  Status:  Discontinued     500 mg 100 mL/hr over 60 Minutes Intravenous To Surgery 03/09/16 1145 03/09/16 1543   03/08/16 0600  vancomycin (VANCOCIN) IVPB 1000 mg/200 mL premix  Status:  Discontinued  1,000 mg 200 mL/hr over 60 Minutes Intravenous Every 8 hours 03/08/16 0550 03/09/16 1548   03/07/16 1130  ciprofloxacin (CIPRO) IVPB 400 mg  Status:  Discontinued     400 mg 200 mL/hr over 60 Minutes Intravenous Every 12 hours 03/07/16 1125 03/10/16 1319   03/07/16 1130  metroNIDAZOLE (FLAGYL) IVPB 500 mg  Status:  Discontinued     500  mg 100 mL/hr over 60 Minutes Intravenous Every 8 hours 03/07/16 1125 03/10/16 1319   03/07/16 0530  ceFEPIme (MAXIPIME) 1 g in dextrose 5 % 50 mL IVPB     1 g 100 mL/hr over 30 Minutes Intravenous  Once 03/07/16 0516 03/07/16 0700   03/07/16 0530  metroNIDAZOLE (FLAGYL) IVPB 500 mg     500 mg 100 mL/hr over 60 Minutes Intravenous  Once 03/07/16 0516 03/07/16 0612      Assessment/Plan: 40 y/o s/p lap chole with bile leak -K+ pending this AM -for ERCP later this AM  -OK for PO after procedure    LOS: 9 days    Marigene Ehlersamirez Jr., Jed LimerickArmando 03/16/2016

## 2016-03-16 NOTE — H&P (View-Only) (Signed)
In endoscopy pre-op, his K was found to be 2.8 and anesthesia reccommended he receive 40mg  K prior to procedure, would take at least 4 hours (perhaps longer).   I am going to let him resume diet, NPO after MN and we'll put on for schedule for ERCP tomorrow with Dr. Russella DarStark.

## 2016-03-17 ENCOUNTER — Encounter (HOSPITAL_COMMUNITY): Payer: Self-pay | Admitting: Gastroenterology

## 2016-03-17 MED ORDER — CYCLOBENZAPRINE HCL 5 MG PO TABS
7.5000 mg | ORAL_TABLET | Freq: Three times a day (TID) | ORAL | Status: DC | PRN
  Administered 2016-03-18: 7.5 mg via ORAL
  Filled 2016-03-17 (×2): qty 2

## 2016-03-17 NOTE — Progress Notes (Signed)
Daily Rounding Note  03/17/2016, 9:36 AM  LOS: 10 days   SUBJECTIVE:       Persistent pain greatest in upper and mid right abdomen, slightly improved from it's peak.  Oral pain meds moderately effective. Dilaudid works better.  Recorded bile drainage 15 ml yesterday, none recorded yet today.   Had BM within last 2 days. No nausea.  Appetite variable, not good so far today.   OBJECTIVE:         Vital signs in last 24 hours:    Temp:  [98 F (36.7 C)-98.6 F (37 C)] 98.2 F (36.8 C) (03/21 0624) Pulse Rate:  [93-110] 100 (03/21 0624) Resp:  [17-21] 19 (03/21 0624) BP: (138-160)/(79-104) 138/79 mmHg (03/21 0624) SpO2:  [94 %-100 %] 97 % (03/21 0624) Weight:  [81.9 kg (180 lb 8.9 oz)] 81.9 kg (180 lb 8.9 oz) (03/21 0500) Last BM Date: 03/16/16 Filed Weights   03/15/16 0641 03/16/16 0629 03/17/16 0500  Weight: 84.1 kg (185 lb 6.5 oz) 84.3 kg (185 lb 13.6 oz) 81.9 kg (180 lb 8.9 oz)   General: looks depressed and unwell.  Sallow but no jaundice  Heart: RRR Chest: clear bil.  No dyspnea Abdomen: distended, tense, bruising on right.  Clear, brown drainage to JP drain.  Tender diffusely, worse on right.   Extremities: no CCE.   Neuro/Psych:  Calm, appropriate, flat affect.   Intake/Output from previous day: 03/20 0701 - 03/21 0700 In: 560 [P.O.:560] Out: 2365 [Urine:2350; Drains:15]  Intake/Output this shift:    Lab Results:  Recent Labs  03/15/16 0937  HGB 11.6*  HCT 34.0*   BMET  Recent Labs  03/15/16 0937 03/16/16 0431  NA 141 141  K 2.8* 3.2*  CL  --  101  CO2  --  25  GLUCOSE 112* 90  BUN  --  10  CREATININE  --  2.25*  CALCIUM  --  8.1*   LFT No results for input(s): PROT, ALBUMIN, AST, ALT, ALKPHOS, BILITOT, BILIDIR, IBILI in the last 72 hours. PT/INR No results for input(s): LABPROT, INR in the last 72 hours. Hepatitis Panel No results for input(s): HEPBSAG, HCVAB, HEPAIGM, HEPBIGM in the  last 72 hours.  Studies/Results: Dg Ercp  03/16/2016  CLINICAL DATA:  40 year old male with a history of ERCP, choledocholithiasis EXAM: ERCP TECHNIQUE: Multiple spot images obtained with the fluoroscopic device and submitted for interpretation post-procedure. FLUOROSCOPY TIME:  Fluoroscopy Time:  1 minutes 21 seconds COMPARISON:  Nuclear medicine study 03/14/2016, CT 03/07/2016 FINDINGS: Limited fluoroscopic images during ERCP. Initial image demonstrates endoscope projecting over the upper abdomen with guidewire cross the ampulla within the biliary system. There is partial opacification of the extrahepatic biliary ducts. Final image demonstrates placement of a plastic stent at the distal common bile duct. IMPRESSION: Limited images during ERCP demonstrates partial opacification of the ductal system and placement of a plastic biliary stent at the distal common bile duct. Please refer to the dictated operative report for full details of intraoperative findings and procedure. Signed, Yvone NeuJaime S. Loreta AveWagner, DO [facility] Electronically Signed   By: Gilmer MorJaime  Wagner D.O.   On: 03/16/2016 13:24   Scheduled Meds: . amLODipine  10 mg Oral Daily  . ciprofloxacin  500 mg Oral BID  . feeding supplement  1 Container Oral TID BM  . metroNIDAZOLE  500 mg Oral 3 times per day  . pantoprazole  40 mg Oral Daily  .  sodium chloride  1,000 mL Intravenous Once   Continuous Infusions:  PRN Meds:.acetaminophen, cyclobenzaprine, HYDROmorphone (DILAUDID) injection, HYDROmorphone (DILAUDID) injection, ondansetron **OR** ondansetron (ZOFRAN) IV, oxyCODONE-acetaminophen, promethazine  ASSESMENT:   *  Bile leak post op.  Lap chole 03/04/16. 3/13 laparoscopy with evacuation of hematoma and drain placement.  03/16/16 ERCP with sphincterotomy and placement of plastic CBD stent. + extravasation of contrast but unable to precisely define locus of bile leak.  Pain continues to be  an issue   PLAN   *  Normally CBD stent removed at ~ 6-8 weeks after placement, that would be in 04/2016.     Jennye Moccasin  03/17/2016, 9:36 AM Pager: [phone]    Attending physician's note   I have taken an interval history, reviewed the chart and examined the patient. I agree with the Advanced Practitioner's note, impression and recommendations. Stable post ERCP/stent placement for bile leak. 15 cc of drain output yesterday and none so far today. Adequate pain control remains a problem and his abdomen remains mildly diffusely tender on exam. Pain related to bile leak should improved steadily day to day.   Claudette Head, MD Clementeen Graham [phone] Mon-Fri 8a-5p [phone] after 5p, weekends, holidays

## 2016-03-17 NOTE — Progress Notes (Signed)
1 Day Post-Op  Subjective: Pt doing well this AM.  Some con't abd soreness  Objective: Vital signs in last 24 hours: Temp:  [98 F (36.7 C)-98.6 F (37 C)] 98.2 F (36.8 C) (03/21 0624) Pulse Rate:  [93-110] 100 (03/21 0624) Resp:  [17-21] 19 (03/21 0624) BP: (138-160)/(79-104) 138/79 mmHg (03/21 0624) SpO2:  [94 %-100 %] 97 % (03/21 0624) Weight:  [81.9 kg (180 lb 8.9 oz)] 81.9 kg (180 lb 8.9 oz) (03/21 0500) Last BM Date: 03/15/16  Intake/Output from previous day: 03/20 0701 - 03/21 0700 In: 560 [P.O.:560] Out: 2365 [Urine:2350; Drains:15] Intake/Output this shift:    General appearance: alert and cooperative Cardio: regular rate and rhythm, S1, S2 normal, no murmur, click, rub or gallop GI: soft, min ttp, JP in place-empty, recently emptied, no rebound/guarding  Lab Results:   Recent Labs  03/15/16 0937  HGB 11.6*  HCT 34.0*   BMET  Recent Labs  03/15/16 0937 03/16/16 0431  NA 141 141  K 2.8* 3.2*  CL  --  101  CO2  --  25  GLUCOSE 112* 90  BUN  --  10  CREATININE  --  2.25*  CALCIUM  --  8.1*   PT/INR No results for input(s): LABPROT, INR in the last 72 hours. ABG No results for input(s): PHART, HCO3 in the last 72 hours.  Invalid input(s): PCO2, PO2  Studies/Results: Dg Ercp  03/16/2016  CLINICAL DATA:  40 year old male with a history of ERCP, choledocholithiasis EXAM: ERCP TECHNIQUE: Multiple spot images obtained with the fluoroscopic device and submitted for interpretation post-procedure. FLUOROSCOPY TIME:  Fluoroscopy Time:  1 minutes 21 seconds COMPARISON:  Nuclear medicine study 03/14/2016, CT 03/07/2016 FINDINGS: Limited fluoroscopic images during ERCP. Initial image demonstrates endoscope projecting over the upper abdomen with guidewire cross the ampulla within the biliary system. There is partial opacification of the extrahepatic biliary ducts. Final image demonstrates placement of a plastic stent at the distal common bile duct. IMPRESSION:  Limited images during ERCP demonstrates partial opacification of the ductal system and placement of a plastic biliary stent at the distal common bile duct. Please refer to the dictated operative report for full details of intraoperative findings and procedure. Signed, Yvone Neu. Loreta Ave, DO [facility] Radiology Electronically Signed   By: Gilmer Mor D.O.   On: 03/16/2016 13:24    Anti-infectives: Anti-infectives    Start     Dose/Rate Route Frequency Ordered Stop   03/13/16 1400  metroNIDAZOLE (FLAGYL) tablet 500 mg     500 mg Oral 3 times per day 03/13/16 1053     03/13/16 1100  ciprofloxacin (CIPRO) tablet 500 mg     500 mg Oral 2 times daily 03/13/16 1053     03/11/16 1100  ciprofloxacin (CIPRO) IVPB 400 mg  Status:  Discontinued     400 mg 200 mL/hr over 60 Minutes Intravenous Every 24 hours 03/10/16 1319 03/13/16 1053   03/10/16 1930  metroNIDAZOLE (FLAGYL) IVPB 500 mg  Status:  Discontinued     500 mg 100 mL/hr over 60 Minutes Intravenous Every 8 hours 03/10/16 1319 03/13/16 1053   03/09/16 1200  metroNIDAZOLE (FLAGYL) IVPB 500 mg  Status:  Discontinued     500 mg 100 mL/hr over 60 Minutes Intravenous To Surgery 03/09/16 1145 03/09/16 1543   03/08/16 0600  vancomycin (VANCOCIN) IVPB 1000 mg/200 mL premix  Status:  Discontinued     1,000 mg 200 mL/hr over 60 Minutes Intravenous Every 8  hours 03/08/16 0550 03/09/16 1548   03/07/16 1130  ciprofloxacin (CIPRO) IVPB 400 mg  Status:  Discontinued     400 mg 200 mL/hr over 60 Minutes Intravenous Every 12 hours 03/07/16 1125 03/10/16 1319   03/07/16 1130  metroNIDAZOLE (FLAGYL) IVPB 500 mg  Status:  Discontinued     500 mg 100 mL/hr over 60 Minutes Intravenous Every 8 hours 03/07/16 1125 03/10/16 1319   03/07/16 0530  ceFEPIme (MAXIPIME) 1 g in dextrose 5 % 50 mL IVPB     1 g 100 mL/hr over 30 Minutes Intravenous  Once 03/07/16 0516 03/07/16 0700   03/07/16 0530  metroNIDAZOLE (FLAGYL)  IVPB 500 mg     500 mg 100 mL/hr over 60 Minutes Intravenous  Once 03/07/16 0516 03/07/16 0612      Assessment/Plan: s/p Procedure(s): ENDOSCOPIC RETROGRADE CHOLANGIOPANCREATOGRAPHY (ERCP) (N/A) -adv diet as tol -mobilize -JP drain education -increase flexeril -home soon   LOS: 10 days    [name], Jed Limerickrmando 03/17/2016

## 2016-03-17 NOTE — Progress Notes (Signed)
On call MD paged due to patient having a temp of 100.2. Awaiting a return call.

## 2016-03-17 NOTE — Care Management Note (Signed)
Case Management Note  Patient Details  Name: Drew Prince MRN: [id number] Date of Birth: 07-14-1976  Subjective/Objective:                    Action/Plan:   Expected Discharge Date:                  Expected Discharge Plan:  Home/Self Care  In-House Referral:     Discharge planning Services     Post Acute Care Choice:    Choice offered to:     DME Arranged:    DME Agency:  NA  HH Arranged:  NA HH Agency:  NA  Status of Service:  Completed, signed off  Medicare Important Message Given:    Date Medicare IM Given:    Medicare IM give by:    Date Additional Medicare IM Given:    Additional Medicare Important Message give by:     If discussed at Long Length of Stay Meetings, dates discussed:  03-17-16  Additional Comments: UR updated  [name], Tenise Stetler Marie, RN 03/17/2016, 10:01 AM

## 2016-03-18 ENCOUNTER — Ambulatory Visit: Payer: BLUE CROSS/BLUE SHIELD | Admitting: Internal Medicine

## 2016-03-18 MED ORDER — PANTOPRAZOLE SODIUM 40 MG PO TBEC
40.0000 mg | DELAYED_RELEASE_TABLET | Freq: Two times a day (BID) | ORAL | Status: DC
  Administered 2016-03-18 – 2016-03-30 (×24): 40 mg via ORAL
  Filled 2016-03-18 (×24): qty 1

## 2016-03-18 MED ORDER — ALUM & MAG HYDROXIDE-SIMETH 200-200-20 MG/5ML PO SUSP
30.0000 mL | ORAL | Status: DC | PRN
  Administered 2016-03-18 – 2016-03-29 (×3): 30 mL via ORAL
  Filled 2016-03-18 (×3): qty 30

## 2016-03-18 NOTE — Progress Notes (Signed)
2 Days Post-Op  Subjective: Pt doing well this PM. Epigastric pain seem to have resolved with mylanta Mobilizing  Objective: Vital signs in last 24 hours: Temp:  [98 F (36.7 C)-100.4 F (38 C)] 98.6 F (37 C) (03/22 0525) Pulse Rate:  [94-104] 94 (03/22 0525) Resp:  [17-18] 17 (03/22 0525) BP: (131-132)/(79-81) 131/81 mmHg (03/22 0525) SpO2:  [94 %-97 %] 94 % (03/22 0525) Weight:  [81.9 kg (180 lb 8.9 oz)] 81.9 kg (180 lb 8.9 oz) (03/22 0500) Last BM Date: 03/17/16  Intake/Output from previous day: 03/21 0701 - 03/22 0700 In: 1220 [P.O.:1220] Out: 410 [Urine:400; Drains:10] Intake/Output this shift: Total I/O In: 600 [P.O.:600] Out: 1000 [Urine:1000]  General appearance: alert and cooperative GI: soft approp ttp, ND, JP drain min bilious output  Lab Results:  No results for input(s): WBC, HGB, HCT, PLT in the last 72 hours. BMET  Recent Labs  03/16/16 0431  NA 141  K 3.2*  CL 101  CO2 25  GLUCOSE 90  BUN 10  CREATININE 2.25*  CALCIUM 8.1*   PT/INR No results for input(s): LABPROT, INR in the last 72 hours. ABG No results for input(s): PHART, HCO3 in the last 72 hours.  Invalid input(s): PCO2, PO2  Studies/Results: Dg Ercp  03/16/2016  CLINICAL DATA:  40 year old male with a history of ERCP, choledocholithiasis EXAM: ERCP TECHNIQUE: Multiple spot images obtained with the fluoroscopic device and submitted for interpretation post-procedure. FLUOROSCOPY TIME:  Fluoroscopy Time:  1 minutes 21 seconds COMPARISON:  Nuclear medicine study 03/14/2016, CT 03/07/2016 FINDINGS: Limited fluoroscopic images during ERCP. Initial image demonstrates endoscope projecting over the upper abdomen with guidewire cross the ampulla within the biliary system. There is partial opacification of the extrahepatic biliary ducts. Final image demonstrates placement of a plastic stent at the distal common bile duct. IMPRESSION: Limited images during ERCP demonstrates partial opacification of  the ductal system and placement of a plastic biliary stent at the distal common bile duct. Please refer to the dictated operative report for full details of intraoperative findings and procedure. Signed, Yvone Neu. Loreta Ave, DO [facility] Radiology Electronically Signed   By: Gilmer Mor D.O.   On: 03/16/2016 13:24    Anti-infectives: Anti-infectives    Start     Dose/Rate Route Frequency Ordered Stop   03/13/16 1400  metroNIDAZOLE (FLAGYL) tablet 500 mg     500 mg Oral 3 times per day 03/13/16 1053     03/13/16 1100  ciprofloxacin (CIPRO) tablet 500 mg     500 mg Oral 2 times daily 03/13/16 1053     03/11/16 1100  ciprofloxacin (CIPRO) IVPB 400 mg  Status:  Discontinued     400 mg 200 mL/hr over 60 Minutes Intravenous Every 24 hours 03/10/16 1319 03/13/16 1053   03/10/16 1930  metroNIDAZOLE (FLAGYL) IVPB 500 mg  Status:  Discontinued     500 mg 100 mL/hr over 60 Minutes Intravenous Every 8 hours 03/10/16 1319 03/13/16 1053   03/09/16 1200  metroNIDAZOLE (FLAGYL) IVPB 500 mg  Status:  Discontinued     500 mg 100 mL/hr over 60 Minutes Intravenous To Surgery 03/09/16 1145 03/09/16 1543   03/08/16 0600  vancomycin (VANCOCIN) IVPB 1000 mg/200 mL premix  Status:  Discontinued     1,000 mg 200 mL/hr over 60 Minutes Intravenous Every 8 hours 03/08/16 0550 03/09/16 1548   03/07/16 1130  ciprofloxacin (CIPRO) IVPB 400 mg  Status:  Discontinued     400 mg 200  mL/hr over 60 Minutes Intravenous Every 12 hours 03/07/16 1125 03/10/16 1319   03/07/16 1130  metroNIDAZOLE (FLAGYL) IVPB 500 mg  Status:  Discontinued     500 mg 100 mL/hr over 60 Minutes Intravenous Every 8 hours 03/07/16 1125 03/10/16 1319   03/07/16 0530  ceFEPIme (MAXIPIME) 1 g in dextrose 5 % 50 mL IVPB     1 g 100 mL/hr over 30 Minutes Intravenous  Once 03/07/16 0516 03/07/16 0700   03/07/16 0530  metroNIDAZOLE (FLAGYL) IVPB 500 mg     500 mg 100 mL/hr over 60 Minutes Intravenous   Once 03/07/16 0516 03/07/16 0612      Assessment/Plan: s/p Procedure(s): ENDOSCOPIC RETROGRADE CHOLANGIOPANCREATOGRAPHY (ERCP) (N/A) Adv to reg diet Mobilize Hopefully home in next 1-2d   LOS: 11 days    [name], Jed Limerickrmando 03/18/2016

## 2016-03-18 NOTE — Progress Notes (Signed)
Daily Rounding Note  03/18/2016, 9:10 AM  LOS: 11 days   SUBJECTIVE:       Temp to 100.4 at 1730 last night.  Used 8 mg oxycodone yesterday, 2 mg so far today.   Used 1 mg Dilaudid yesterday, 1 mg so far today.  JP drain output 10 ml recorded for 3/21.  Last BM 3/20.  Overall feels a bit better, pain persists but improved.  Anorexia persists, no nausea.   OBJECTIVE:         Vital signs in last 24 hours:    Temp:  [98 F (36.7 C)-100.4 F (38 C)] 98.6 F (37 C) (03/22 0525) Pulse Rate:  [94-104] 94 (03/22 0525) Resp:  [17-18] 17 (03/22 0525) BP: (131-132)/(79-81) 131/81 mmHg (03/22 0525) SpO2:  [94 %-97 %] 94 % (03/22 0525) Weight:  [81.9 kg (180 lb 8.9 oz)] 81.9 kg (180 lb 8.9 oz) (03/22 0500) Last BM Date: 03/17/16 Filed Weights   03/16/16 0629 03/17/16 0500 03/18/16 0500  Weight: 84.3 kg (185 lb 13.6 oz) 81.9 kg (180 lb 8.9 oz) 81.9 kg (180 lb 8.9 oz)   General: looks about the same, moderately ill and sallow vs jaundice.     Heart: tachy, regular Chest: clear bil.  No cough or dyspnea Abdomen: soft, BS quiet, distended.  Tender all over upper and mid abdomen.  Scant brownish/clear fluid in JP drain.   Extremities: no CCE Neuro/Psych:  Oriented x 3.  Calm, cooperative.  No neuro deficits.  Intake/Output from previous day: 03/21 0701 - 03/22 0700 In: 1220 [P.O.:1220] Out: 410 [Urine:400; Drains:10]  Intake/Output this shift: Total I/O In: -  Out: 1000 [Urine:1000]  Lab Results:  Recent Labs  03/15/16 0937  HGB 11.6*  HCT 34.0*   BMET  Recent Labs  03/15/16 0937 03/16/16 0431  NA 141 141  K 2.8* 3.2*  CL  --  101  CO2  --  25  GLUCOSE 112* 90  BUN  --  10  CREATININE  --  2.25*  CALCIUM  --  8.1*   LFT No results for input(s): PROT, ALBUMIN, AST, ALT, ALKPHOS, BILITOT, BILIDIR, IBILI in the last 72 hours. PT/INR No results for input(s): LABPROT, INR in the last 72 hours. Hepatitis  Panel No results for input(s): HEPBSAG, HCVAB, HEPAIGM, HEPBIGM in the last 72 hours.  Studies/Results: Dg Ercp  03/16/2016  CLINICAL DATA:  40 year old male with a history of ERCP, choledocholithiasis EXAM: ERCP TECHNIQUE: Multiple spot images obtained with the fluoroscopic device and submitted for interpretation post-procedure. FLUOROSCOPY TIME:  Fluoroscopy Time:  1 minutes 21 seconds COMPARISON:  Nuclear medicine study 03/14/2016, CT 03/07/2016 FINDINGS: Limited fluoroscopic images during ERCP. Initial image demonstrates endoscope projecting over the upper abdomen with guidewire cross the ampulla within the biliary system. There is partial opacification of the extrahepatic biliary ducts. Final image demonstrates placement of a plastic stent at the distal common bile duct. IMPRESSION: Limited images during ERCP demonstrates partial opacification of the ductal system and placement of a plastic biliary stent at the distal common bile duct. Please refer to the dictated operative report for full details of intraoperative findings and procedure. Signed, Yvone NeuJaime S. Loreta AveWagner, DO [facility] Electronically Signed   By: Gilmer MorJaime  Wagner D.O.   On: 03/16/2016 13:24   Scheduled Meds: . amLODipine  10 mg Oral Daily  . ciprofloxacin  500 mg Oral BID  . feeding supplement  1 Container  Oral TID BM  . metroNIDAZOLE  500 mg Oral 3 times per day  . pantoprazole  40 mg Oral Daily  . sodium chloride  1,000 mL Intravenous Once   Continuous Infusions:  PRN Meds:.acetaminophen, cyclobenzaprine, HYDROmorphone (DILAUDID) injection, HYDROmorphone (DILAUDID) injection, ondansetron **OR** ondansetron (ZOFRAN) IV, oxyCODONE-acetaminophen, promethazine   ASSESMENT:   * Bile leak post op. Lap chole 03/04/16. 3/13 laparoscopy with evacuation of hematoma and drain placement.  03/16/16 ERCP with sphincterotomy and placement of plastic CBD stent. + extravasation of contrast  but unable to precisely define locus of bile leak.  Very minor drainage volume to JP.  Pain slightly better.  Last LFTs assay (normal except for low albumin)  3/17.    *  Fever.  Continues on oral Flagyl/Cipro.  No WBC count since 3/16.  *  Hypokalemia.   *  AKI.  GFR improving.    PLAN   *  Maintain supportive care.    *   CBC, CMET in AM.      Jennye Moccasin  03/18/2016, 9:10 AM Pager: [phone]     Attending physician's note   I have taken an interval history, reviewed the chart and examined the patient. I agree with the Advanced Practitioner's note, impression and recommendations. Abd pain improving. JP drainage of 10 ml yesterday. Biliary stent is functioning to seal off bile leak. Fever to 100.4 yesterday. He complains of indigestion and lower chest pain with deep breaths today. Will add Mylanta and increase Protonix to BID for now. Primary service to further evaluate chest pain and fever. GI signing off. Outpatient GI follow up with Dr. Christella Hartigan in 1 month.   Claudette Head, MD Clementeen Graham [phone] Mon-Fri 8a-5p [phone] after 5p, weekends, holidays

## 2016-03-19 LAB — COMPREHENSIVE METABOLIC PANEL
ALBUMIN: 2.7 g/dL — AB (ref 3.5–5.0)
ALT: 29 U/L (ref 17–63)
ANION GAP: 13 (ref 5–15)
AST: 61 U/L — AB (ref 15–41)
Alkaline Phosphatase: 297 U/L — ABNORMAL HIGH (ref 38–126)
BILIRUBIN TOTAL: 0.7 mg/dL (ref 0.3–1.2)
BUN: 8 mg/dL (ref 6–20)
CHLORIDE: 95 mmol/L — AB (ref 101–111)
CO2: 31 mmol/L (ref 22–32)
Calcium: 8.4 mg/dL — ABNORMAL LOW (ref 8.9–10.3)
Creatinine, Ser: 1.67 mg/dL — ABNORMAL HIGH (ref 0.61–1.24)
GFR calc Af Amer: 58 mL/min — ABNORMAL LOW (ref >60)
GFR calc non Af Amer: 50 mL/min — ABNORMAL LOW (ref >60)
GLUCOSE: 125 mg/dL — AB (ref 65–99)
POTASSIUM: 3.5 mmol/L (ref 3.5–5.1)
SODIUM: 139 mmol/L (ref 135–145)
TOTAL PROTEIN: 6.4 g/dL — AB (ref 6.5–8.1)

## 2016-03-19 LAB — CBC
HCT: 33.9 % — ABNORMAL LOW (ref 39.0–52.0)
Hemoglobin: 11.1 g/dL — ABNORMAL LOW (ref 13.0–17.0)
MCH: 25.3 pg — ABNORMAL LOW (ref 26.0–34.0)
MCHC: 32.7 g/dL (ref 30.0–36.0)
MCV: 77.4 fL — ABNORMAL LOW (ref 78.0–100.0)
PLATELETS: 523 10*3/uL — AB (ref 150–400)
RBC: 4.38 MIL/uL (ref 4.22–5.81)
RDW: 14 % (ref 11.5–15.5)
WBC: 15.2 10*3/uL — AB (ref 4.0–10.5)

## 2016-03-19 LAB — LIPASE, BLOOD: Lipase: 390 U/L — ABNORMAL HIGH (ref 11–51)

## 2016-03-19 MED ORDER — DEXTROSE-NACL 5-0.9 % IV SOLN
INTRAVENOUS | Status: DC
  Administered 2016-03-19 – 2016-03-24 (×11): via INTRAVENOUS

## 2016-03-19 NOTE — Care Management Note (Signed)
Case Management Note  Patient Details  Name: Drew Prince MRN: [id number] Date of Birth: [birth year]  Subjective/Objective:                    Action/Plan:  UR updated  Expected Discharge Date:                  Expected Discharge Plan:  Home/Self Care  In-House Referral:     Discharge planning Services     Post Acute Care Choice:    Choice offered to:     DME Arranged:    DME Agency:  NA  HH Arranged:  NA HH Agency:  NA  Status of Service:  Completed, signed off  Medicare Important Message Given:    Date Medicare IM Given:    Medicare IM give by:    Date Additional Medicare IM Given:    Additional Medicare Important Message give by:     If discussed at Long Length of Stay Meetings, dates discussed:    Additional Comments:  [name], Saniyya Gau Marie, RN 03/19/2016, 3:10 PM

## 2016-03-19 NOTE — Progress Notes (Signed)
3 Days Post-Op  Subjective: Min appetite Complaint of upper epigastric pain  Objective: Vital signs in last 24 hours: Temp:  [98 F (36.7 C)-99.8 F (37.7 C)] 98 F (36.7 C) (03/23 0657) Pulse Rate:  [96-113] 96 (03/23 0657) Resp:  [18] 18 (03/23 0657) BP: (131-145)/(78-85) 143/85 mmHg (03/23 0657) SpO2:  [96 %] 96 % (03/23 0657) Weight:  [80.8 kg (178 lb 2.1 oz)] 80.8 kg (178 lb 2.1 oz) (03/23 0452) Last BM Date: 03/18/16  Intake/Output from previous day: 03/22 0701 - 03/23 0700 In: 1560 [P.O.:1560] Out: 3550 [Urine:3550] Intake/Output this shift:    General appearance: alert and cooperative Cardio: regular rate and rhythm, S1, S2 normal, no murmur, click, rub or gallop GI: midn ttp in epigastrum, no rebound/guarding  Lab Results:   Recent Labs  03/19/16 0430  WBC 15.2*  HGB 11.1*  HCT 33.9*  PLT 523*   BMET  Recent Labs  03/19/16 0430  NA 139  K 3.5  CL 95*  CO2 31  GLUCOSE 125*  BUN 8  CREATININE 1.67*  CALCIUM 8.4*   PT/INR No results for input(s): LABPROT, INR in the last 72 hours. ABG No results for input(s): PHART, HCO3 in the last 72 hours.  Invalid input(s): PCO2, PO2  Studies/Results: No results found.  Anti-infectives: Anti-infectives    Start     Dose/Rate Route Frequency Ordered Stop   03/13/16 1400  metroNIDAZOLE (FLAGYL) tablet 500 mg  Status:  Discontinued     500 mg Oral 3 times per day 03/13/16 1053 03/18/16 1300   03/13/16 1100  ciprofloxacin (CIPRO) tablet 500 mg  Status:  Discontinued     500 mg Oral 2 times daily 03/13/16 1053 03/18/16 1300   03/11/16 1100  ciprofloxacin (CIPRO) IVPB 400 mg  Status:  Discontinued     400 mg 200 mL/hr over 60 Minutes Intravenous Every 24 hours 03/10/16 1319 03/13/16 1053   03/10/16 1930  metroNIDAZOLE (FLAGYL) IVPB 500 mg  Status:  Discontinued     500 mg 100 mL/hr over 60 Minutes Intravenous Every 8 hours 03/10/16 1319 03/13/16 1053   03/09/16 1200  metroNIDAZOLE (FLAGYL) IVPB 500 mg   Status:  Discontinued     500 mg 100 mL/hr over 60 Minutes Intravenous To Surgery 03/09/16 1145 03/09/16 1543   03/08/16 0600  vancomycin (VANCOCIN) IVPB 1000 mg/200 mL premix  Status:  Discontinued     1,000 mg 200 mL/hr over 60 Minutes Intravenous Every 8 hours 03/08/16 0550 03/09/16 1548   03/07/16 1130  ciprofloxacin (CIPRO) IVPB 400 mg  Status:  Discontinued     400 mg 200 mL/hr over 60 Minutes Intravenous Every 12 hours 03/07/16 1125 03/10/16 1319   03/07/16 1130  metroNIDAZOLE (FLAGYL) IVPB 500 mg  Status:  Discontinued     500 mg 100 mL/hr over 60 Minutes Intravenous Every 8 hours 03/07/16 1125 03/10/16 1319   03/07/16 0530  ceFEPIme (MAXIPIME) 1 g in dextrose 5 % 50 mL IVPB     1 g 100 mL/hr over 30 Minutes Intravenous  Once 03/07/16 0516 03/07/16 0700   03/07/16 0530  metroNIDAZOLE (FLAGYL) IVPB 500 mg     500 mg 100 mL/hr over 60 Minutes Intravenous  Once 03/07/16 0516 03/07/16 0612      Assessment/Plan: s/p Procedure(s): ENDOSCOPIC RETROGRADE CHOLANGIOPANCREATOGRAPHY (ERCP) (N/A) Will repeat lipase this AM.  Could be a component of post ERCP pancreas aggrevation. Will obtain EKG to r/o cardiac issues Mobilize   LOS: 12 days  Marigene Ehlers., Jed Limerick 03/19/2016

## 2016-03-20 LAB — COMPREHENSIVE METABOLIC PANEL
ALT: 21 U/L (ref 17–63)
ANION GAP: 10 (ref 5–15)
AST: 23 U/L (ref 15–41)
Albumin: 3.1 g/dL — ABNORMAL LOW (ref 3.5–5.0)
Alkaline Phosphatase: 199 U/L — ABNORMAL HIGH (ref 38–126)
BUN: 7 mg/dL (ref 6–20)
CHLORIDE: 101 mmol/L (ref 101–111)
CO2: 31 mmol/L (ref 22–32)
Calcium: 9 mg/dL (ref 8.9–10.3)
Creatinine, Ser: 1.49 mg/dL — ABNORMAL HIGH (ref 0.61–1.24)
GFR calc non Af Amer: 57 mL/min — ABNORMAL LOW (ref >60)
Glucose, Bld: 108 mg/dL — ABNORMAL HIGH (ref 65–99)
POTASSIUM: 3.4 mmol/L — AB (ref 3.5–5.1)
SODIUM: 142 mmol/L (ref 135–145)
Total Bilirubin: 0.7 mg/dL (ref 0.3–1.2)
Total Protein: 7.2 g/dL (ref 6.5–8.1)

## 2016-03-20 LAB — LIPASE, BLOOD: LIPASE: 90 U/L — AB (ref 11–51)

## 2016-03-20 MED ORDER — ENOXAPARIN SODIUM 40 MG/0.4ML ~~LOC~~ SOLN
40.0000 mg | SUBCUTANEOUS | Status: DC
  Administered 2016-03-20 – 2016-03-25 (×6): 40 mg via SUBCUTANEOUS
  Filled 2016-03-20 (×6): qty 0.4

## 2016-03-20 MED ORDER — FENTANYL 25 MCG/HR TD PT72
25.0000 ug | MEDICATED_PATCH | TRANSDERMAL | Status: DC
  Administered 2016-03-20 – 2016-03-29 (×4): 25 ug via TRANSDERMAL
  Filled 2016-03-20 (×4): qty 1

## 2016-03-20 NOTE — Progress Notes (Signed)
Patient ID: Drew Prince, male   DOB: 23-Jun-1976, 40 y.o.   MRN: [id number]     [address] [place], [place] [address]    Phone: [phone] FAX: [phone]     Subjective: No n/v. C/o pain mostly ruq. Afebrile.  VSS.   Minimal drain output.  Objective:  Vital signs:  Filed Vitals:   03/19/16 1504 03/19/16 2238 03/20/16 0500 03/20/16 0602  BP: 136/84 153/98  128/99  Pulse: 107 109  99  Temp: 98.8 F (37.1 C) 99.3 F (37.4 C)  98.7 F (37.1 C)  TempSrc: Oral Oral  Oral  Resp: '18 18  18  '$ Height:      Weight:   80.9 kg (178 lb 5.6 oz)   SpO2: 96% 98%  99%    Last BM Date: 03/18/16  Intake/Output   Yesterday:  03/23 0701 - 03/24 0700 In: 1005 [P.O.:300; I.V.:705] Out: 1941 [Urine:1150; Drains:2] This shift: I/O last 3 completed shifts: In: 7408 [P.O.:780; I.V.:705] Out: 1952 [Urine:1950; Drains:2]   Physical Exam: General: Pt awake/alert/oriented x4 in no acute distress  Abdomen: Soft.  Nondistended. Mild ttp ruq and luq. Drain with bilious output.   Problem List:   Active Problems:   Peritonitis (HCC)   Bile leak   Generalized abdominal pain   Abnormal biliary HIDA scan    Results:   Labs: Results for orders placed or performed during the hospital encounter of 03/07/16 (from the past 48 hour(s))  CBC     Status: Abnormal   Collection Time: 03/19/16  4:30 AM  Result Value Ref Range   WBC 15.2 (H) 4.0 - 10.5 K/uL   RBC 4.38 4.22 - 5.81 MIL/uL   Hemoglobin 11.1 (L) 13.0 - 17.0 g/dL   HCT 33.9 (L) 39.0 - 52.0 %   MCV 77.4 (L) 78.0 - 100.0 fL   MCH 25.3 (L) 26.0 - 34.0 pg   MCHC 32.7 30.0 - 36.0 g/dL   RDW 14.0 11.5 - 15.5 %   Platelets 523 (H) 150 - 400 K/uL  Comprehensive metabolic panel     Status: Abnormal   Collection Time: 03/19/16  4:30 AM  Result Value Ref Range   Sodium 139 135 - 145 mmol/L   Potassium 3.5 3.5 - 5.1 mmol/L   Chloride 95 (L) 101 - 111 mmol/L   CO2 31  22 - 32 mmol/L   Glucose, Bld 125 (H) 65 - 99 mg/dL   BUN 8 6 - 20 mg/dL   Creatinine, Ser 1.67 (H) 0.61 - 1.24 mg/dL   Calcium 8.4 (L) 8.9 - 10.3 mg/dL   Total Protein 6.4 (L) 6.5 - 8.1 g/dL   Albumin 2.7 (L) 3.5 - 5.0 g/dL   AST 61 (H) 15 - 41 U/L   ALT 29 17 - 63 U/L   Alkaline Phosphatase 297 (H) 38 - 126 U/L   Total Bilirubin 0.7 0.3 - 1.2 mg/dL   GFR calc non Af Amer 50 (L) >60 mL/min   GFR calc Af Amer 58 (L) >60 mL/min    Comment: (NOTE) The eGFR has been calculated using the CKD EPI equation. This calculation has not been validated in all clinical situations. eGFR's persistently <60 mL/min signify possible Chronic Kidney Disease.    Anion gap 13 5 - 15  Lipase, blood     Status: Abnormal   Collection Time: 03/19/16  8:28 AM  Result Value Ref Range   Lipase  390 (H) 11 - 51 U/L    Imaging / Studies: No results found.  Medications / Allergies:  Scheduled Meds: . amLODipine  10 mg Oral Daily  . pantoprazole  40 mg Oral BID AC  . sodium chloride  1,000 mL Intravenous Once   Continuous Infusions: . dextrose 5 % and 0.9% NaCl 100 mL/hr at 03/20/16 0602   PRN Meds:.acetaminophen, alum & mag hydroxide-simeth, cyclobenzaprine, HYDROmorphone (DILAUDID) injection, HYDROmorphone (DILAUDID) injection, ondansetron **OR** ondansetron (ZOFRAN) IV, oxyCODONE-acetaminophen, promethazine  Antibiotics: Anti-infectives    Start     Dose/Rate Route Frequency Ordered Stop   03/13/16 1400  metroNIDAZOLE (FLAGYL) tablet 500 mg  Status:  Discontinued     500 mg Oral 3 times per day 03/13/16 1053 03/18/16 1300   03/13/16 1100  ciprofloxacin (CIPRO) tablet 500 mg  Status:  Discontinued     500 mg Oral 2 times daily 03/13/16 1053 03/18/16 1300   03/11/16 1100  ciprofloxacin (CIPRO) IVPB 400 mg  Status:  Discontinued     400 mg 200 mL/hr over 60 Minutes Intravenous Every 24 hours 03/10/16 1319 03/13/16 1053   03/10/16 1930  metroNIDAZOLE (FLAGYL) IVPB 500 mg  Status:  Discontinued      500 mg 100 mL/hr over 60 Minutes Intravenous Every 8 hours 03/10/16 1319 03/13/16 1053   03/09/16 1200  metroNIDAZOLE (FLAGYL) IVPB 500 mg  Status:  Discontinued     500 mg 100 mL/hr over 60 Minutes Intravenous To Surgery 03/09/16 1145 03/09/16 1543   03/08/16 0600  vancomycin (VANCOCIN) IVPB 1000 mg/200 mL premix  Status:  Discontinued     1,000 mg 200 mL/hr over 60 Minutes Intravenous Every 8 hours 03/08/16 0550 03/09/16 1548   03/07/16 1130  ciprofloxacin (CIPRO) IVPB 400 mg  Status:  Discontinued     400 mg 200 mL/hr over 60 Minutes Intravenous Every 12 hours 03/07/16 1125 03/10/16 1319   03/07/16 1130  metroNIDAZOLE (FLAGYL) IVPB 500 mg  Status:  Discontinued     500 mg 100 mL/hr over 60 Minutes Intravenous Every 8 hours 03/07/16 1125 03/10/16 1319   03/07/16 0530  ceFEPIme (MAXIPIME) 1 g in dextrose 5 % 50 mL IVPB     1 g 100 mL/hr over 30 Minutes Intravenous  Once 03/07/16 0516 03/07/16 0700   03/07/16 0530  metroNIDAZOLE (FLAGYL) IVPB 500 mg     500 mg 100 mL/hr over 60 Minutes Intravenous  Once 03/07/16 0516 03/07/16 0612        Assessment/Plan Lap chole for GSP 03/04/16 Diagnostic laparoscopy, evacuation of hematoma 03/09/16 ERCP for bile leak, stent  Post ERCP pancreatitis  -repeat labs, allow for clears only, mobilize, pain meds, IVF AKI-improving  VTE prophylaxis-SCD, add lovenox   Erby Pian, ANP-BC [facility] Pager [phone](7A-4:30P) For consults and floor pages call [phone](7A-4:30P)  03/20/2016 10:07 AM

## 2016-03-21 LAB — CBC
HEMATOCRIT: 34.4 % — AB (ref 39.0–52.0)
Hemoglobin: 11.4 g/dL — ABNORMAL LOW (ref 13.0–17.0)
MCH: 26.1 pg (ref 26.0–34.0)
MCHC: 33.1 g/dL (ref 30.0–36.0)
MCV: 78.9 fL (ref 78.0–100.0)
Platelets: 483 10*3/uL — ABNORMAL HIGH (ref 150–400)
RBC: 4.36 MIL/uL (ref 4.22–5.81)
RDW: 14.3 % (ref 11.5–15.5)
WBC: 10.8 10*3/uL — AB (ref 4.0–10.5)

## 2016-03-21 LAB — COMPREHENSIVE METABOLIC PANEL
ALT: 17 U/L (ref 17–63)
AST: 18 U/L (ref 15–41)
Albumin: 3 g/dL — ABNORMAL LOW (ref 3.5–5.0)
Alkaline Phosphatase: 159 U/L — ABNORMAL HIGH (ref 38–126)
Anion gap: 11 (ref 5–15)
BUN: 5 mg/dL — ABNORMAL LOW (ref 6–20)
CHLORIDE: 103 mmol/L (ref 101–111)
CO2: 29 mmol/L (ref 22–32)
Calcium: 9.1 mg/dL (ref 8.9–10.3)
Creatinine, Ser: 1.3 mg/dL — ABNORMAL HIGH (ref 0.61–1.24)
Glucose, Bld: 114 mg/dL — ABNORMAL HIGH (ref 65–99)
POTASSIUM: 3.3 mmol/L — AB (ref 3.5–5.1)
SODIUM: 143 mmol/L (ref 135–145)
Total Bilirubin: 1 mg/dL (ref 0.3–1.2)
Total Protein: 7 g/dL (ref 6.5–8.1)

## 2016-03-21 LAB — LIPASE, BLOOD: Lipase: 83 U/L — ABNORMAL HIGH (ref 11–51)

## 2016-03-21 MED ORDER — HYDROMORPHONE HCL 1 MG/ML IJ SOLN
1.0000 mg | INTRAMUSCULAR | Status: DC | PRN
  Administered 2016-03-21 – 2016-03-30 (×49): 1 mg via INTRAVENOUS
  Filled 2016-03-21 (×50): qty 1

## 2016-03-21 NOTE — Progress Notes (Signed)
[facility] Progress Note  5 Days Post-Op  Subjective: Pt doing okay, still has pain in epigastrium and over drain.  Drain with straw colored output.  Ambulating OOB well, still requiring IV pain medicine frequently.  Still NPO.    Objective: Vital signs in last 24 hours: Temp:  [98.7 F (37.1 C)] 98.7 F (37.1 C) (03/25 0458) Pulse Rate:  [102-115] 102 (03/25 0458) Resp:  [18] 18 (03/25 0458) BP: (134-146)/(88-100) 134/92 mmHg (03/25 0458) SpO2:  [100 %] 100 % (03/25 0458) Weight:  [80.2 kg (176 lb 12.9 oz)] 80.2 kg (176 lb 12.9 oz) (03/25 0458) Last BM Date: 03/18/16  Intake/Output from previous day: 03/24 0701 - 03/25 0700 In: 3692.7 [P.O.:100; I.V.:3592.7] Out: 2910 [Urine:2900; Drains:10] Intake/Output this shift:    PE: Gen:  Alert, NAD, pleasant Card:  RRR, no M/G/R heard Pulm:  CTA, no W/R/R Abd: Soft, ND, moderate tenderness over epigastrium and RUQ drain, +BS, no HSM, incisions C/D/I, drain with minimal straw colored drainage   Lab Results:   Recent Labs  03/19/16 0430  WBC 15.2*  HGB 11.1*  HCT 33.9*  PLT 523*   BMET  Recent Labs  03/19/16 0430 03/20/16 1058  NA 139 142  K 3.5 3.4*  CL 95* 101  CO2 31 31  GLUCOSE 125* 108*  BUN 8 7  CREATININE 1.67* 1.49*  CALCIUM 8.4* 9.0   PT/INR No results for input(s): LABPROT, INR in the last 72 hours. CMP     Component Value Date/Time   NA 142 03/20/2016 1058   K 3.4* 03/20/2016 1058   CL 101 03/20/2016 1058   CO2 31 03/20/2016 1058   GLUCOSE 108* 03/20/2016 1058   BUN 7 03/20/2016 1058   CREATININE 1.49* 03/20/2016 1058   CALCIUM 9.0 03/20/2016 1058   PROT 7.2 03/20/2016 1058   ALBUMIN 3.1* 03/20/2016 1058   AST 23 03/20/2016 1058   ALT 21 03/20/2016 1058   ALKPHOS 199* 03/20/2016 1058   BILITOT 0.7 03/20/2016 1058   GFRNONAA 57* 03/20/2016 1058   GFRAA >60 03/20/2016 1058   Lipase     Component Value Date/Time   LIPASE 90* 03/20/2016 1058        Studies/Results: No results found.  Anti-infectives: Anti-infectives    Start     Dose/Rate Route Frequency Ordered Stop   03/13/16 1400  metroNIDAZOLE (FLAGYL) tablet 500 mg  Status:  Discontinued     500 mg Oral 3 times per day 03/13/16 1053 03/18/16 1300   03/13/16 1100  ciprofloxacin (CIPRO) tablet 500 mg  Status:  Discontinued     500 mg Oral 2 times daily 03/13/16 1053 03/18/16 1300   03/11/16 1100  ciprofloxacin (CIPRO) IVPB 400 mg  Status:  Discontinued     400 mg 200 mL/hr over 60 Minutes Intravenous Every 24 hours 03/10/16 1319 03/13/16 1053   03/10/16 1930  metroNIDAZOLE (FLAGYL) IVPB 500 mg  Status:  Discontinued     500 mg 100 mL/hr over 60 Minutes Intravenous Every 8 hours 03/10/16 1319 03/13/16 1053   03/09/16 1200  metroNIDAZOLE (FLAGYL) IVPB 500 mg  Status:  Discontinued     500 mg 100 mL/hr over 60 Minutes Intravenous To Surgery 03/09/16 1145 03/09/16 1543   03/08/16 0600  vancomycin (VANCOCIN) IVPB 1000 mg/200 mL premix  Status:  Discontinued     1,000 mg 200 mL/hr over 60 Minutes Intravenous Every 8 hours 03/08/16 0550 03/09/16 1548   03/07/16 1130  ciprofloxacin (CIPRO) IVPB 400 mg  Status:  Discontinued     400 mg 200 mL/hr over 60 Minutes Intravenous Every 12 hours 03/07/16 1125 03/10/16 1319   03/07/16 1130  metroNIDAZOLE (FLAGYL) IVPB 500 mg  Status:  Discontinued     500 mg 100 mL/hr over 60 Minutes Intravenous Every 8 hours 03/07/16 1125 03/10/16 1319   03/07/16 0530  ceFEPIme (MAXIPIME) 1 g in dextrose 5 % 50 mL IVPB     1 g 100 mL/hr over 30 Minutes Intravenous  Once 03/07/16 0516 03/07/16 0700   03/07/16 0530  metroNIDAZOLE (FLAGYL) IVPB 500 mg     500 mg 100 mL/hr over 60 Minutes Intravenous  Once 03/07/16 0516 03/07/16 0612       Assessment/Plan Lap chole for GS Pancreatitis 03/04/16 POD #12 s/p Diagnostic laparoscopy, evacuation of hematoma 03/09/16 ERCP for bile leak, stent  Post ERCP pancreatitis  -repeat labs pending, ordered  for tomorrow as well, NPO currently due to pain, mobilize, encouraged orals for pain, IVF AKI-improving  VTE prophylaxis-SCD, add lovenox Disp-When pain improved can start diet    LOS: 14 days    Nonie HoyerMegan N Zehava Turski 03/21/2016, 8:05 AM Pager: [phone]  (7am - 4:30pm M-F; 7am - 11:30am Sa/Su)

## 2016-03-22 LAB — COMPREHENSIVE METABOLIC PANEL
ALBUMIN: 3 g/dL — AB (ref 3.5–5.0)
ALT: 15 U/L — ABNORMAL LOW (ref 17–63)
ANION GAP: 11 (ref 5–15)
AST: 21 U/L (ref 15–41)
Alkaline Phosphatase: 134 U/L — ABNORMAL HIGH (ref 38–126)
BUN: 5 mg/dL — AB (ref 6–20)
CHLORIDE: 103 mmol/L (ref 101–111)
CO2: 27 mmol/L (ref 22–32)
Calcium: 9 mg/dL (ref 8.9–10.3)
Creatinine, Ser: 1.24 mg/dL (ref 0.61–1.24)
GFR calc Af Amer: >60 mL/min (ref >60)
GFR calc non Af Amer: >60 mL/min (ref >60)
GLUCOSE: 106 mg/dL — AB (ref 65–99)
POTASSIUM: 3.4 mmol/L — AB (ref 3.5–5.1)
SODIUM: 141 mmol/L (ref 135–145)
TOTAL PROTEIN: 6.7 g/dL (ref 6.5–8.1)
Total Bilirubin: 0.8 mg/dL (ref 0.3–1.2)

## 2016-03-22 LAB — CBC
HEMATOCRIT: 34 % — AB (ref 39.0–52.0)
HEMOGLOBIN: 10.9 g/dL — AB (ref 13.0–17.0)
MCH: 25.3 pg — ABNORMAL LOW (ref 26.0–34.0)
MCHC: 32.1 g/dL (ref 30.0–36.0)
MCV: 79.1 fL (ref 78.0–100.0)
Platelets: 445 10*3/uL — ABNORMAL HIGH (ref 150–400)
RBC: 4.3 MIL/uL (ref 4.22–5.81)
RDW: 14 % (ref 11.5–15.5)
WBC: 8.6 10*3/uL (ref 4.0–10.5)

## 2016-03-22 LAB — LIPASE, BLOOD: Lipase: 87 U/L — ABNORMAL HIGH (ref 11–51)

## 2016-03-22 MED ORDER — IOHEXOL 300 MG/ML  SOLN: 50.0000 mL | INTRAMUSCULAR | Status: AC

## 2016-03-22 NOTE — Progress Notes (Signed)
6 Days Post-Op  Subjective: Not any better, some nausea, small amt flatus, epigastric discomfort  Objective: Vital signs in last 24 hours: Temp:  [97.7 F (36.5 C)-98.6 F (37 C)] 97.7 F (36.5 C) (03/26 0505) Pulse Rate:  [94-103] 94 (03/26 0505) Resp:  [17-18] 17 (03/26 0505) BP: (130-140)/(82-93) 130/82 mmHg (03/26 0505) SpO2:  [97 %-100 %] 97 % (03/26 0505) Weight:  [76.5 kg (168 lb 10.4 oz)] 76.5 kg (168 lb 10.4 oz) (03/26 0505) Last BM Date: 03/18/16  Intake/Output from previous day: 03/25 0701 - 03/26 0700 In: 2634 [P.O.:100; I.V.:2534] Out: 2807.5 [Urine:2800; Drains:7.5] Intake/Output this shift: Total I/O In: 0  Out: 700 [Urine:700]  GI: drain with minimal fluid in it now, tender epigastrium, some bs  Lab Results:   Recent Labs  03/21/16 1009 03/22/16 0618  WBC 10.8* 8.6  HGB 11.4* 10.9*  HCT 34.4* 34.0*  PLT 483* 445*   BMET  Recent Labs  03/21/16 1009 03/22/16 0618  NA 143 141  K 3.3* 3.4*  CL 103 103  CO2 29 27  GLUCOSE 114* 106*  BUN 5* 5*  CREATININE 1.30* 1.24  CALCIUM 9.1 9.0   PT/INR No results for input(s): LABPROT, INR in the last 72 hours. ABG No results for input(s): PHART, HCO3 in the last 72 hours.  Invalid input(s): PCO2, PO2  Studies/Results: No results found.  Anti-infectives: Anti-infectives    Start     Dose/Rate Route Frequency Ordered Stop   03/13/16 1400  metroNIDAZOLE (FLAGYL) tablet 500 mg  Status:  Discontinued     500 mg Oral 3 times per day 03/13/16 1053 03/18/16 1300   03/13/16 1100  ciprofloxacin (CIPRO) tablet 500 mg  Status:  Discontinued     500 mg Oral 2 times daily 03/13/16 1053 03/18/16 1300   03/11/16 1100  ciprofloxacin (CIPRO) IVPB 400 mg  Status:  Discontinued     400 mg 200 mL/hr over 60 Minutes Intravenous Every 24 hours 03/10/16 1319 03/13/16 1053   03/10/16 1930  metroNIDAZOLE (FLAGYL) IVPB 500 mg  Status:  Discontinued     500 mg 100 mL/hr over 60 Minutes Intravenous Every 8 hours  03/10/16 1319 03/13/16 1053   03/09/16 1200  metroNIDAZOLE (FLAGYL) IVPB 500 mg  Status:  Discontinued     500 mg 100 mL/hr over 60 Minutes Intravenous To Surgery 03/09/16 1145 03/09/16 1543   03/08/16 0600  vancomycin (VANCOCIN) IVPB 1000 mg/200 mL premix  Status:  Discontinued     1,000 mg 200 mL/hr over 60 Minutes Intravenous Every 8 hours 03/08/16 0550 03/09/16 1548   03/07/16 1130  ciprofloxacin (CIPRO) IVPB 400 mg  Status:  Discontinued     400 mg 200 mL/hr over 60 Minutes Intravenous Every 12 hours 03/07/16 1125 03/10/16 1319   03/07/16 1130  metroNIDAZOLE (FLAGYL) IVPB 500 mg  Status:  Discontinued     500 mg 100 mL/hr over 60 Minutes Intravenous Every 8 hours 03/07/16 1125 03/10/16 1319   03/07/16 0530  ceFEPIme (MAXIPIME) 1 g in dextrose 5 % 50 mL IVPB     1 g 100 mL/hr over 30 Minutes Intravenous  Once 03/07/16 0516 03/07/16 0700   03/07/16 0530  metroNIDAZOLE (FLAGYL) IVPB 500 mg     500 mg 100 mL/hr over 60 Minutes Intravenous  Once 03/07/16 0516 03/07/16 0612      Assessment/Plan: Lap chole for GS Pancreatitis 03/04/16 POD #13 s/p Diagnostic laparoscopy, evacuation of hematoma 03/09/16 ERCP for bile leak, stent  Post ERCP pancreatitis -  there is mild elevation of lipase, Im not sure he actually has this as the source AKI-improving, cr normal now VTE prophylaxis-SCD, lovenox  I think he needs repeat ct scan today.  I would expect him to be getting better. Has history of pseudocysts.  Lipase really isnt elevated enough to give dx of pancreatitis at this point.    Carris Health LLCWAKEFIELD,Drew Prince 03/22/2016

## 2016-03-23 ENCOUNTER — Encounter (HOSPITAL_COMMUNITY): Payer: Self-pay | Admitting: Radiology

## 2016-03-23 ENCOUNTER — Inpatient Hospital Stay (HOSPITAL_COMMUNITY): Payer: BLUE CROSS/BLUE SHIELD

## 2016-03-23 LAB — BASIC METABOLIC PANEL
Anion gap: 12 (ref 5–15)
CO2: 26 mmol/L (ref 22–32)
CREATININE: 1.06 mg/dL (ref 0.61–1.24)
Calcium: 9 mg/dL (ref 8.9–10.3)
Chloride: 100 mmol/L — ABNORMAL LOW (ref 101–111)
GFR calc Af Amer: >60 mL/min (ref >60)
Glucose, Bld: 101 mg/dL — ABNORMAL HIGH (ref 65–99)
POTASSIUM: 3 mmol/L — AB (ref 3.5–5.1)
SODIUM: 138 mmol/L (ref 135–145)

## 2016-03-23 MED ORDER — POTASSIUM CHLORIDE 20 MEQ/15ML (10%) PO SOLN
40.0000 meq | Freq: Two times a day (BID) | ORAL | Status: DC
  Administered 2016-03-23 – 2016-03-30 (×15): 40 meq via ORAL
  Filled 2016-03-23 (×15): qty 30

## 2016-03-23 MED ORDER — IOHEXOL 300 MG/ML  SOLN
80.0000 mL | Freq: Once | INTRAMUSCULAR | Status: AC | PRN
  Administered 2016-03-23: 80 mL via INTRAVENOUS

## 2016-03-23 NOTE — Progress Notes (Signed)
7 Days Post-Op  Subjective: Pt with some R costal margin pain. Min appetite Ambulating well  Objective: Vital signs in last 24 hours: Temp:  [98.5 F (36.9 C)-98.9 F (37.2 C)] 98.5 F (36.9 C) (03/27 0505) Pulse Rate:  [99-115] 99 (03/27 0505) Resp:  [17-18] 18 (03/27 0505) BP: (127-154)/(83-99) 127/83 mmHg (03/27 0505) SpO2:  [96 %-99 %] 96 % (03/27 0505) Last BM Date: 03/18/16  Intake/Output from previous day: 03/26 0701 - 03/27 0700 In: 2400 [I.V.:2400] Out: 3552.5 [Urine:3550; Drains:2.5] Intake/Output this shift:    General appearance: alert and cooperative Cardio: regular rate and rhythm, S1, S2 normal, no murmur, click, rub or gallop GI: soft, ttp RUQ, JP drain light green  Lab Results:   Recent Labs  03/21/16 1009 03/22/16 0618  WBC 10.8* 8.6  HGB 11.4* 10.9*  HCT 34.4* 34.0*  PLT 483* 445*   BMET  Recent Labs  03/22/16 0618 03/22/16 2320  NA 141 138  K 3.4* 3.0*  CL 103 100*  CO2 27 26  GLUCOSE 106* 101*  BUN 5* <5*  CREATININE 1.24 1.06  CALCIUM 9.0 9.0   PT/INR No results for input(s): LABPROT, INR in the last 72 hours. ABG No results for input(s): PHART, HCO3 in the last 72 hours.  Invalid input(s): PCO2, PO2  Studies/Results: Ct Abdomen Pelvis W Contrast  03/23/2016  CLINICAL DATA:  Six days status post ERCP. Recent laparoscopy with drain placement. Initial encounter. EXAM: CT ABDOMEN AND PELVIS WITH CONTRAST TECHNIQUE: Multidetector CT imaging of the abdomen and pelvis was performed using the standard protocol following bolus administration of intravenous contrast. CONTRAST:  80mL OMNIPAQUE IOHEXOL 300 MG/ML  SOLN COMPARISON:  CT of the abdomen and pelvis performed 03/07/2016 FINDINGS: Mild bibasilar atelectasis or scarring is noted. There is a prominent collection of fluid at the gallbladder fossa, measuring approximately 7.4 x 7.0 x 6.8 cm, with trace associated air. The patient's drainage catheter passes through the collection, though  it ends more centrally at the mid abdomen. Reaccumulation of fluid at the gallbladder fossa is suspicious for a bile leak. The patient's common bile duct stent is noted in expected position. The previously noted complex pseudocyst along the pancreatic body and tail is relatively stable from the prior study, measuring approximately 3.3 cm in width, and 10 cm in length. No significant soft tissue inflammation is noted about the pancreas at this time. This abuts the stomach, but does not appear to erode into the gastric wall, with slight preservation of surrounding fat at this time. Mild pneumobilia is noted, likely reflecting common bile duct stent placement. The liver and spleen are otherwise unremarkable. The patient is status post cholecystectomy. The adrenal glands are unremarkable. A tiny right renal cyst is noted. The kidneys are otherwise unremarkable. Mild nonspecific perinephric stranding is noted bilaterally. There is no evidence of hydronephrosis. No renal or ureteral stones are seen. The small bowel is unremarkable in appearance. The stomach is within normal limits. No acute vascular abnormalities are seen. The appendix is normal in caliber, without evidence of appendicitis. Contrast progresses to the level of the hepatic flexure of the colon. The colon is unremarkable in appearance. The bladder is mildly distended and grossly unremarkable. The prostate remains normal in size. No inguinal lymphadenopathy is seen. No acute osseous abnormalities are identified. IMPRESSION: 1. Prominent collection of fluid at the gallbladder fossa, measuring 7.4 x 7.0 x 6.8 cm, with trace associated air. Drainage catheter passes through the collection, though the catheter ends more centrally at  the mid abdomen. Reaccumulation of fluid the gallbladder fossa is suspicious for a bile leak. Common bile duct stent noted in expected position. 2. Previously noted complex pseudocyst along the pancreatic body and tail is relatively  stable from the prior study, measuring approximately 10 cm in length, and 3.3 cm in length. This abuts the stomach, but does not appear to erode into the gastric wall. 3. Mild pneumobilia likely reflects common bile duct stent placement. 4. Tiny right renal cyst noted. 5. Mild bibasilar atelectasis or scarring noted. These results were called by telephone at the time of interpretation on 03/23/2016 at 1:20 am to Dr. Emelia LoronMATTHEW WAKEFIELD, who verbally acknowledged these results. Electronically Signed   By: Roanna RaiderJeffery  Chang M.D.   On: 03/23/2016 02:50    Anti-infectives: Anti-infectives    Start     Dose/Rate Route Frequency Ordered Stop   03/13/16 1400  metroNIDAZOLE (FLAGYL) tablet 500 mg  Status:  Discontinued     500 mg Oral 3 times per day 03/13/16 1053 03/18/16 1300   03/13/16 1100  ciprofloxacin (CIPRO) tablet 500 mg  Status:  Discontinued     500 mg Oral 2 times daily 03/13/16 1053 03/18/16 1300   03/11/16 1100  ciprofloxacin (CIPRO) IVPB 400 mg  Status:  Discontinued     400 mg 200 mL/hr over 60 Minutes Intravenous Every 24 hours 03/10/16 1319 03/13/16 1053   03/10/16 1930  metroNIDAZOLE (FLAGYL) IVPB 500 mg  Status:  Discontinued     500 mg 100 mL/hr over 60 Minutes Intravenous Every 8 hours 03/10/16 1319 03/13/16 1053   03/09/16 1200  metroNIDAZOLE (FLAGYL) IVPB 500 mg  Status:  Discontinued     500 mg 100 mL/hr over 60 Minutes Intravenous To Surgery 03/09/16 1145 03/09/16 1543   03/08/16 0600  vancomycin (VANCOCIN) IVPB 1000 mg/200 mL premix  Status:  Discontinued     1,000 mg 200 mL/hr over 60 Minutes Intravenous Every 8 hours 03/08/16 0550 03/09/16 1548   03/07/16 1130  ciprofloxacin (CIPRO) IVPB 400 mg  Status:  Discontinued     400 mg 200 mL/hr over 60 Minutes Intravenous Every 12 hours 03/07/16 1125 03/10/16 1319   03/07/16 1130  metroNIDAZOLE (FLAGYL) IVPB 500 mg  Status:  Discontinued     500 mg 100 mL/hr over 60 Minutes Intravenous Every 8 hours 03/07/16 1125 03/10/16 1319    03/07/16 0530  ceFEPIme (MAXIPIME) 1 g in dextrose 5 % 50 mL IVPB     1 g 100 mL/hr over 30 Minutes Intravenous  Once 03/07/16 0516 03/07/16 0700   03/07/16 0530  metroNIDAZOLE (FLAGYL) IVPB 500 mg     500 mg 100 mL/hr over 60 Minutes Intravenous  Once 03/07/16 0516 03/07/16 0612      Assessment/Plan: s/p Procedure(s): ENDOSCOPIC RETROGRADE CHOLANGIOPANCREATOGRAPHY (ERCP) (N/A) Advance diet to FLD Lipase appears to have decreased significantly Decrease IVF Mobilize  LOS: 16 days    Marigene Ehlersamirez Jr., Jed Limerickrmando 03/23/2016

## 2016-03-24 MED ORDER — SODIUM CHLORIDE 0.9 % IV SOLN
INTRAVENOUS | Status: DC
  Administered 2016-03-24 – 2016-03-29 (×8): via INTRAVENOUS

## 2016-03-24 NOTE — Progress Notes (Signed)
8 Days Post-Op  Subjective: Pt with con't RUQ abd pain where IA fluid collection sits with JP running through it. Mobilizing Tol PO  Objective: Vital signs in last 24 hours: Temp:  [98.6 F (37 C)-99.1 F (37.3 C)] 99.1 F (37.3 C) (03/28 0500) Pulse Rate:  [102-106] 102 (03/28 0500) Resp:  [18] 18 (03/28 0500) BP: (120-135)/(75-89) 120/75 mmHg (03/28 0500) SpO2:  [96 %-99 %] 96 % (03/28 0500) Last BM Date: 03/18/16  Intake/Output from previous day: 03/27 0701 - 03/28 0700 In: 3118.3 [P.O.:1080; I.V.:2038.3] Out: 2010 [Urine:2000; Drains:10] Intake/Output this shift:    General appearance: alert and cooperative GI: soft, non-tender; bowel sounds normal; no masses,  no organomegaly and JP min with clear/greenish drainage  Lab Results:   Recent Labs  03/21/16 1009 03/22/16 0618  WBC 10.8* 8.6  HGB 11.4* 10.9*  HCT 34.4* 34.0*  PLT 483* 445*   BMET  Recent Labs  03/22/16 0618 03/22/16 2320  NA 141 138  K 3.4* 3.0*  CL 103 100*  CO2 27 26  GLUCOSE 106* 101*  BUN 5* <5*  CREATININE 1.24 1.06  CALCIUM 9.0 9.0   PT/INR No results for input(s): LABPROT, INR in the last 72 hours. ABG No results for input(s): PHART, HCO3 in the last 72 hours.  Invalid input(s): PCO2, PO2  Studies/Results: Ct Abdomen Pelvis W Contrast  03/23/2016  CLINICAL DATA:  Six days status post ERCP. Recent laparoscopy with drain placement. Initial encounter. EXAM: CT ABDOMEN AND PELVIS WITH CONTRAST TECHNIQUE: Multidetector CT imaging of the abdomen and pelvis was performed using the standard protocol following bolus administration of intravenous contrast. CONTRAST:  80mL OMNIPAQUE IOHEXOL 300 MG/ML  SOLN COMPARISON:  CT of the abdomen and pelvis performed 03/07/2016 FINDINGS: Mild bibasilar atelectasis or scarring is noted. There is a prominent collection of fluid at the gallbladder fossa, measuring approximately 7.4 x 7.0 x 6.8 cm, with trace associated air. The patient's drainage catheter  passes through the collection, though it ends more centrally at the mid abdomen. Reaccumulation of fluid at the gallbladder fossa is suspicious for a bile leak. The patient's common bile duct stent is noted in expected position. The previously noted complex pseudocyst along the pancreatic body and tail is relatively stable from the prior study, measuring approximately 3.3 cm in width, and 10 cm in length. No significant soft tissue inflammation is noted about the pancreas at this time. This abuts the stomach, but does not appear to erode into the gastric wall, with slight preservation of surrounding fat at this time. Mild pneumobilia is noted, likely reflecting common bile duct stent placement. The liver and spleen are otherwise unremarkable. The patient is status post cholecystectomy. The adrenal glands are unremarkable. A tiny right renal cyst is noted. The kidneys are otherwise unremarkable. Mild nonspecific perinephric stranding is noted bilaterally. There is no evidence of hydronephrosis. No renal or ureteral stones are seen. The small bowel is unremarkable in appearance. The stomach is within normal limits. No acute vascular abnormalities are seen. The appendix is normal in caliber, without evidence of appendicitis. Contrast progresses to the level of the hepatic flexure of the colon. The colon is unremarkable in appearance. The bladder is mildly distended and grossly unremarkable. The prostate remains normal in size. No inguinal lymphadenopathy is seen. No acute osseous abnormalities are identified. IMPRESSION: 1. Prominent collection of fluid at the gallbladder fossa, measuring 7.4 x 7.0 x 6.8 cm, with trace associated air. Drainage catheter passes through the collection, though the catheter  ends more centrally at the mid abdomen. Reaccumulation of fluid the gallbladder fossa is suspicious for a bile leak. Common bile duct stent noted in expected position. 2. Previously noted complex pseudocyst along the  pancreatic body and tail is relatively stable from the prior study, measuring approximately 10 cm in length, and 3.3 cm in length. This abuts the stomach, but does not appear to erode into the gastric wall. 3. Mild pneumobilia likely reflects common bile duct stent placement. 4. Tiny right renal cyst noted. 5. Mild bibasilar atelectasis or scarring noted. These results were called by telephone at the time of interpretation on 03/23/2016 at 1:20 am to Dr. Emelia Loron, who verbally acknowledged these results. Electronically Signed   By: Roanna Raider M.D.   On: 03/23/2016 02:50    Anti-infectives: Anti-infectives    Start     Dose/Rate Route Frequency Ordered Stop   03/13/16 1400  metroNIDAZOLE (FLAGYL) tablet 500 mg  Status:  Discontinued     500 mg Oral 3 times per day 03/13/16 1053 03/18/16 1300   03/13/16 1100  ciprofloxacin (CIPRO) tablet 500 mg  Status:  Discontinued     500 mg Oral 2 times daily 03/13/16 1053 03/18/16 1300   03/11/16 1100  ciprofloxacin (CIPRO) IVPB 400 mg  Status:  Discontinued     400 mg 200 mL/hr over 60 Minutes Intravenous Every 24 hours 03/10/16 1319 03/13/16 1053   03/10/16 1930  metroNIDAZOLE (FLAGYL) IVPB 500 mg  Status:  Discontinued     500 mg 100 mL/hr over 60 Minutes Intravenous Every 8 hours 03/10/16 1319 03/13/16 1053   03/09/16 1200  metroNIDAZOLE (FLAGYL) IVPB 500 mg  Status:  Discontinued     500 mg 100 mL/hr over 60 Minutes Intravenous To Surgery 03/09/16 1145 03/09/16 1543   03/08/16 0600  vancomycin (VANCOCIN) IVPB 1000 mg/200 mL premix  Status:  Discontinued     1,000 mg 200 mL/hr over 60 Minutes Intravenous Every 8 hours 03/08/16 0550 03/09/16 1548   03/07/16 1130  ciprofloxacin (CIPRO) IVPB 400 mg  Status:  Discontinued     400 mg 200 mL/hr over 60 Minutes Intravenous Every 12 hours 03/07/16 1125 03/10/16 1319   03/07/16 1130  metroNIDAZOLE (FLAGYL) IVPB 500 mg  Status:  Discontinued     500 mg 100 mL/hr over 60 Minutes Intravenous Every 8  hours 03/07/16 1125 03/10/16 1319   03/07/16 0530  ceFEPIme (MAXIPIME) 1 g in dextrose 5 % 50 mL IVPB     1 g 100 mL/hr over 30 Minutes Intravenous  Once 03/07/16 0516 03/07/16 0700   03/07/16 0530  metroNIDAZOLE (FLAGYL) IVPB 500 mg     500 mg 100 mL/hr over 60 Minutes Intravenous  Once 03/07/16 0516 03/07/16 0612      Assessment/Plan: s/p Procedure(s): ENDOSCOPIC RETROGRADE CHOLANGIOPANCREATOGRAPHY (ERCP) (N/A) Adv diet to soft low fat Mobilize Possible IR to aspirate fluid collection, pt currently not in favor Home soon   LOS: 17 days    Marigene Ehlers., Jed Limerick 03/24/2016

## 2016-03-25 NOTE — Care Management Note (Signed)
Case Management Note  Patient Details  Name: Drew Prince MRN: [id number] Date of Birth: 1976-04-13  Subjective/Objective:                    Action/Plan:  UR updated  Expected Discharge Date:                  Expected Discharge Plan:  Home/Self Care  In-House Referral:     Discharge planning Services     Post Acute Care Choice:    Choice offered to:     DME Arranged:    DME Agency:  NA  HH Arranged:  NA HH Agency:  NA  Status of Service:  Completed, signed off  Medicare Important Message Given:    Date Medicare IM Given:    Medicare IM give by:    Date Additional Medicare IM Given:    Additional Medicare Important Message give by:     If discussed at Long Length of Stay Meetings, dates discussed:    Additional Comments:  [name], [name], RN 03/25/2016, 7:44 AM

## 2016-03-25 NOTE — Progress Notes (Signed)
9 Days Post-Op  Subjective: Pt with some cont' RUQ pain. Mobilizing.  Tol some soft diet  Objective: Vital signs in last 24 hours: Temp:  [98.3 F (36.8 C)-98.4 F (36.9 C)] 98.3 F (36.8 C) (03/29 0635) Pulse Rate:  [101-109] 105 (03/29 0635) Resp:  [16-17] 16 (03/29 0635) BP: (122-140)/(71-83) 140/83 mmHg (03/29 0635) SpO2:  [93 %-97 %] 93 % (03/29 0635) Last BM Date: 03/18/16  Intake/Output from previous day: 03/28 0701 - 03/29 0700 In: 2610 [P.O.:1560; I.V.:1050] Out: 2215 [Urine:2200; Drains:15] Intake/Output this shift:    General appearance: alert and cooperative GI: soft, ttp RUQ, no rebound/guarding  Lab Results:  No results for input(s): WBC, HGB, HCT, PLT in the last 72 hours. BMET  Recent Labs  03/22/16 2320  NA 138  K 3.0*  CL 100*  CO2 26  GLUCOSE 101*  BUN <5*  CREATININE 1.06  CALCIUM 9.0   PT/INR No results for input(s): LABPROT, INR in the last 72 hours. ABG No results for input(s): PHART, HCO3 in the last 72 hours.  Invalid input(s): PCO2, PO2  Studies/Results: No results found.  Anti-infectives: Anti-infectives    Start     Dose/Rate Route Frequency Ordered Stop   03/13/16 1400  metroNIDAZOLE (FLAGYL) tablet 500 mg  Status:  Discontinued     500 mg Oral 3 times per day 03/13/16 1053 03/18/16 1300   03/13/16 1100  ciprofloxacin (CIPRO) tablet 500 mg  Status:  Discontinued     500 mg Oral 2 times daily 03/13/16 1053 03/18/16 1300   03/11/16 1100  ciprofloxacin (CIPRO) IVPB 400 mg  Status:  Discontinued     400 mg 200 mL/hr over 60 Minutes Intravenous Every 24 hours 03/10/16 1319 03/13/16 1053   03/10/16 1930  metroNIDAZOLE (FLAGYL) IVPB 500 mg  Status:  Discontinued     500 mg 100 mL/hr over 60 Minutes Intravenous Every 8 hours 03/10/16 1319 03/13/16 1053   03/09/16 1200  metroNIDAZOLE (FLAGYL) IVPB 500 mg  Status:  Discontinued     500 mg 100 mL/hr over 60 Minutes Intravenous To Surgery 03/09/16 1145 03/09/16 1543   03/08/16 0600   vancomycin (VANCOCIN) IVPB 1000 mg/200 mL premix  Status:  Discontinued     1,000 mg 200 mL/hr over 60 Minutes Intravenous Every 8 hours 03/08/16 0550 03/09/16 1548   03/07/16 1130  ciprofloxacin (CIPRO) IVPB 400 mg  Status:  Discontinued     400 mg 200 mL/hr over 60 Minutes Intravenous Every 12 hours 03/07/16 1125 03/10/16 1319   03/07/16 1130  metroNIDAZOLE (FLAGYL) IVPB 500 mg  Status:  Discontinued     500 mg 100 mL/hr over 60 Minutes Intravenous Every 8 hours 03/07/16 1125 03/10/16 1319   03/07/16 0530  ceFEPIme (MAXIPIME) 1 g in dextrose 5 % 50 mL IVPB     1 g 100 mL/hr over 30 Minutes Intravenous  Once 03/07/16 0516 03/07/16 0700   03/07/16 0530  metroNIDAZOLE (FLAGYL) IVPB 500 mg     500 mg 100 mL/hr over 60 Minutes Intravenous  Once 03/07/16 0516 03/07/16 0612      Assessment/Plan: s/p Procedure(s): ENDOSCOPIC RETROGRADE CHOLANGIOPANCREATOGRAPHY (ERCP) (N/A) -Consult RADS for US guided aspiration of intraabd fluid collection -Mobilize -home soon   LOS: 18 days    [name], Jed Limerickrmando 03/25/2016

## 2016-03-25 NOTE — H&P (Signed)
Chief Complaint: RUQ pain with GB fossa fluid collection  Referring Physician(s): Axel Filler  Supervising Physician: Ruel Favors  History of Present Illness: Drew Prince is a 40 y.o. male who underwent a Laparoscopic Cholecystectomy on March 8 for gallstone pancreatitis.  His post-operative course has been complicated by development of hematoma and peritnoitis and he underwent laparoscopy with placement of a 19 french drain on March 13th.  He then was diagnosed with a bile leak on March 18th and he underwent ERCP on March 19th.  He has remained in the hospital on IV antibiotics and slowly advancing diet.  JP output slowed and repeat CT scan was obtained which revealed a prominent fluid collection in the GB fossa.  We are asked to evaluate him for image guided drainage/drain placement.  Past Medical History  Diagnosis Date  . Hypercholesteremia   . Hypertension     01-05-16 to 02-03-16 Hospitalized for pancreatitis, B/P med discontinued during that time.  . H/O seasonal allergies     OTC med used  . Depression   . GERD (gastroesophageal reflux disease)   . Acute pancreatitis     1-8-17to 02-03-16 Hospital stay at [facility].    Past Surgical History  Procedure Laterality Date  . Wisdom tooth extraction      "local only"  . Cholecystectomy N/A 03/04/2016    Procedure: LAPAROSCOPIC CHOLECYSTECTOMY ;  Surgeon: Axel Filler, MD;  Location: [facility] ORS;  Service: General;  Laterality: N/A;  . Laparoscopy N/A 03/09/2016    Procedure: LAPAROSCOPY DIAGNOSTIC;  Surgeon: Axel Filler, MD;  [facility];  Service: General;  Laterality: N/A;  . Hematoma evacuation  03/09/2016    Procedure: LAPAROSCOPIC EVACUATION OF HEMATOMA;  Surgeon: Axel Filler, MD;  Location: [facility] OR;  Service: General;;  . Ercp N/A 03/16/2016    Procedure: ENDOSCOPIC RETROGRADE CHOLANGIOPANCREATOGRAPHY (ERCP);  Surgeon: Meryl Dare, MD;  Location: [facility] ENDOSCOPY;  Service: Endoscopy;  Laterality: N/A;      Allergies: Penicillins  Medications: Prior to Admission medications   Medication Sig Start Date End Date Taking? Authorizing Provider  buPROPion (WELLBUTRIN XL) 150 MG 24 hr tablet Take 150 mg by mouth daily.   Yes Historical Provider, MD  ibuprofen (ADVIL,MOTRIN) 200 MG tablet Take 200 mg by mouth every 6 (six) hours as needed for mild pain.   Yes Historical Provider, MD  ondansetron (ZOFRAN-ODT) 4 MG disintegrating tablet Take 1-2 tablets (4-8 mg total) by mouth every 8 (eight) hours as needed for nausea or vomiting. 02/10/16  Yes Ejiroghene E Emokpae, MD  oxyCODONE (OXY IR/ROXICODONE) 5 MG immediate release tablet Take 1-2 tablets (5-10 mg total) by mouth every 8 (eight) hours as needed for severe pain. 02/24/16  Yes Courtney Paris, MD  oxyCODONE-acetaminophen (ROXICET) 5-325 MG tablet Take 1-2 tablets by mouth every 4 (four) hours as needed. 03/04/16  Yes Axel Filler, MD  pantoprazole (PROTONIX) 40 MG tablet Take 1 tablet (40 mg total) by mouth daily. 02/03/16  Yes Selina Cooley, MD     Family History  Problem Relation Age of Onset  . Hypertension Father     Social History   Social History  . Marital Status: Married    Spouse Name: N/A  . Number of Children: N/A  . Years of Education: N/A   Social History Main Topics  . Smoking status: Never Smoker   . Smokeless tobacco: None  . Alcohol Use: 0.0 oz/week    0 Standard drinks or equivalent per week  Comment: rarely  . Drug Use: No  . Sexual Activity: Not Asked   Other Topics Concern  . None   Social History Narrative    Review of Systems: A 12 point ROS discussed  Review of Systems  Constitutional: Positive for activity change and appetite change. Negative for fever and chills.  HENT: Negative.   Respiratory: Negative for cough and shortness of breath.   Cardiovascular: Negative for chest pain.  Gastrointestinal: Positive for abdominal pain. Negative for nausea and vomiting.  Genitourinary: Negative.    Musculoskeletal: Negative.   Skin: Negative.   Neurological: Negative.   Psychiatric/Behavioral: Negative.     Vital Signs: BP 134/87 mmHg  Pulse 112  Temp(Src) 98.3 F (36.8 C) (Oral)  Resp 16  Ht  (1.626 m)  Wt 168 lb 10.4 oz (76.5 kg)  BMI 28.93 kg/m2  SpO2 93%  Physical Exam  Constitutional: He is oriented to person, place, and time. He appears well-developed and well-nourished.  HENT:  Head: Normocephalic and atraumatic.  Eyes: EOM are normal.  Neck: Neck supple.  Cardiovascular: Normal rate, regular rhythm and normal heart sounds.   Pulmonary/Chest: Effort normal and breath sounds normal. No respiratory distress.  Abdominal: Soft. There is tenderness.  Musculoskeletal: Normal range of motion.  Neurological: He is alert and oriented to person, place, and time.  Skin: Skin is warm and dry.  Psychiatric: He has a normal mood and affect. His behavior is normal. Judgment and thought content normal.  Vitals reviewed.   Mallampati Score:  MD Evaluation Airway: WNL Heart: WNL Abdomen: Other (comments) Abdomen comments: Post-op incisions healing. Drain in place RUQ Chest/ Lungs: WNL ASA  Classification: 2 Mallampati/Airway Score: One  Imaging: Nm Hepatobiliary Liver Func  03/14/2016  CLINICAL DATA:  40 year old male status post laparoscopic cholecystectomy on 03/04/2016, complicated by cholecystectomy bed hematoma requiring laparoscopic drainage on 03/09/2016, with biliary appearing drainage from surgical drain. EXAM: NUCLEAR MEDICINE HEPATOBILIARY IMAGING TECHNIQUE: Sequential images of the abdomen were obtained out to 60 minutes following intravenous administration of radiopharmaceutical. RADIOPHARMACEUTICALS:  5.4 mCi Tc-6m  Choletec IV COMPARISON:  03/07/2016 hepatobiliary scintigraphy and CT abdomen/ pelvis . FINDINGS: There is normal blood pool clearance and radiotracer concentration by the liver. There is normal immediate excretion of radiotracer into the  common bile duct, duodenum and proximal small bowel. There is new abnormal pooling of radiotracer within the cholecystectomy bed. IMPRESSION: 1. New abnormal pooling of radiotracer within the cholecystectomy bed, consistent with a bile leak, potentially from the undersurface of the liver, although a common hepatic/bile duct or cystic duct stump origin cannot be excluded. 2. No scintigraphic evidence of biliary obstruction. 3. No scintigraphic evidence of hepatocellular dysfunction. These results were called by telephone at the time of interpretation on 03/14/2016 at 12:58 pm to Dr. Laurell Josephs, who verbally acknowledged these results. Electronically Signed   By: Delbert Phenix M.D.   On: 03/14/2016 12:59   Nm Hepatobiliary Including Gb  03/07/2016  CLINICAL DATA:  Status post cholecystectomy, abnormal LFTs, evaluate for bile leak versus CBD stone EXAM: NUCLEAR MEDICINE HEPATOBILIARY IMAGING TECHNIQUE: Sequential images of the abdomen were obtained out to 60 minutes following intravenous administration of radiopharmaceutical. RADIOPHARMACEUTICALS:  5.28 mCi mCi Tc-58m Choletec IV COMPARISON:  CT abdomen pelvis dated 03/07/2016 at 0813 hours FINDINGS: Normal hepatic excretion. Visualization of the proximal small bowel within 15 minutes, confirming common duct patency. No extravasation of radiotracer to suggest a bile leak. IMPRESSION: No extravasation radiotracer to suggest a bile leak. Patent common duct. Electronically  Signed   By: Charline Bills M.D.   On: 03/07/2016 14:52   US Abdomen Complete  02/28/2016  CLINICAL DATA:  Cholelithiasis. EXAM: ABDOMEN ULTRASOUND COMPLETE COMPARISON:  CT 01/25/2016. FINDINGS: Gallbladder: Gallstones. Largest measures 1.4 mm. Gallbladder wall is thickened at 4.2 mm. Cholecystitis could present this fashion. Positive Murphy sign. A gallstone in the cystic duct cannot be excluded. Common bile duct: Diameter: 4.7 mm. Liver: Liver is echogenic suggesting fatty infiltration and/or  hepatocellular disease. Air for appears to be focal fatty sparing noted gallbladder fossa. IVC: No abnormality visualized. Pancreas: Visualized portion unremarkable. Spleen: Size and appearance within normal limits. Right Kidney: Length: 11.1 cm. Echogenicity within normal limits. No mass or hydronephrosis visualized. Left Kidney: Length: 11.5 cm. Echogenicity within normal limits. No mass or hydronephrosis visualized. Abdominal aorta: No aneurysm visualized. Other findings: None. IMPRESSION: 1. Gallstones with probable stone in the cystic duct. Gallbladder wall thickening to 4.2 mm. Positive Murphy sign. Cholecystitis cannot be excluded. Common bile duct is nondilated. 2. Findings consistent with hepatic fatty infiltration with focal fatty sparing Electronically Signed   By: Maisie Fus  Register   On: 02/28/2016 11:30   Ct Abdomen Pelvis W Contrast  03/23/2016  CLINICAL DATA:  Six days status post ERCP. Recent laparoscopy with drain placement. Initial encounter. EXAM: CT ABDOMEN AND PELVIS WITH CONTRAST TECHNIQUE: Multidetector CT imaging of the abdomen and pelvis was performed using the standard protocol following bolus administration of intravenous contrast. CONTRAST:  80mL OMNIPAQUE IOHEXOL 300 MG/ML  SOLN COMPARISON:  CT of the abdomen and pelvis performed 03/07/2016 FINDINGS: Mild bibasilar atelectasis or scarring is noted. There is a prominent collection of fluid at the gallbladder fossa, measuring approximately 7.4 x 7.0 x 6.8 cm, with trace associated air. The patient's drainage catheter passes through the collection, though it ends more centrally at the mid abdomen. Reaccumulation of fluid at the gallbladder fossa is suspicious for a bile leak. The patient's common bile duct stent is noted in expected position. The previously noted complex pseudocyst along the pancreatic body and tail is relatively stable from the prior study, measuring approximately 3.3 cm in width, and 10 cm in length. No significant soft  tissue inflammation is noted about the pancreas at this time. This abuts the stomach, but does not appear to erode into the gastric wall, with slight preservation of surrounding fat at this time. Mild pneumobilia is noted, likely reflecting common bile duct stent placement. The liver and spleen are otherwise unremarkable. The patient is status post cholecystectomy. The adrenal glands are unremarkable. A tiny right renal cyst is noted. The kidneys are otherwise unremarkable. Mild nonspecific perinephric stranding is noted bilaterally. There is no evidence of hydronephrosis. No renal or ureteral stones are seen. The small bowel is unremarkable in appearance. The stomach is within normal limits. No acute vascular abnormalities are seen. The appendix is normal in caliber, without evidence of appendicitis. Contrast progresses to the level of the hepatic flexure of the colon. The colon is unremarkable in appearance. The bladder is mildly distended and grossly unremarkable. The prostate remains normal in size. No inguinal lymphadenopathy is seen. No acute osseous abnormalities are identified. IMPRESSION: 1. Prominent collection of fluid at the gallbladder fossa, measuring 7.4 x 7.0 x 6.8 cm, with trace associated air. Drainage catheter passes through the collection, though the catheter ends more centrally at the mid abdomen. Reaccumulation of fluid the gallbladder fossa is suspicious for a bile leak. Common bile duct stent noted in expected position. 2. Previously noted complex pseudocyst  along the pancreatic body and tail is relatively stable from the prior study, measuring approximately 10 cm in length, and 3.3 cm in length. This abuts the stomach, but does not appear to erode into the gastric wall. 3. Mild pneumobilia likely reflects common bile duct stent placement. 4. Tiny right renal cyst noted. 5. Mild bibasilar atelectasis or scarring noted. These results were called by telephone at the time of interpretation on  03/23/2016 at 1:20 am to Dr. Emelia Loron, who verbally acknowledged these results. Electronically Signed   By: Roanna Raider M.D.   On: 03/23/2016 02:50   Ct Abdomen Pelvis W Contrast  03/07/2016  CLINICAL DATA:  Right-sided abdominal pain following cholecystectomy 3 days ago. EXAM: CT ABDOMEN AND PELVIS WITH CONTRAST TECHNIQUE: Multidetector CT imaging of the abdomen and pelvis was performed using the standard protocol following bolus administration of intravenous contrast. CONTRAST:  OMNIPAQUE IOHEXOL 300 MG/ML  SOLN COMPARISON:  Abdominal ultrasound 02/28/2016. CT of the abdomen and pelvis 01/25/2016. FINDINGS: Lower chest: Asymmetric right-sided basilar airspace disease is present. This most likely reflects atelectasis. Early infection is not excluded. The heart size is normal. No significant pleural or pericardial effusions are present. Hepatobiliary: There is mild diffuse fatty infiltration of the liver. Gas inflammatory changes are present within the gallbladder fossa following cholecystectomy. There is no discrete fluid collection. The common bile duct is within normal limits. A small amount of perihepatic fluid is evident. Pancreas: A complex peripancreatic fluid collection is slightly decreased in size since the prior exam. The collection now measures 6.1 x 8.8 x 3.3 cm. The pancreas otherwise enhances normally. Spleen: Within normal limits. Adrenals/Urinary Tract: The adrenal glands are normal bilaterally. The kidneys and ureters are within normal limits. The urinary bladder is unremarkable. Stomach/Bowel: The stomach is within normal limits. There are secondary inflammatory changes within the second and third portions of the duodenum. The small bowel is unremarkable. The cecum and appendix are within normal limits. There are inflammatory changes about the colon at the hepatic flexure including wall thickening. The more distal transverse colon is normal. The descending and rectosigmoid  colon is within normal limits. Vascular/Lymphatic: No significant vascular lesions are present. Sub cm para-aortic lymph nodes are likely reactive. Reproductive: Within normal limits. Other: A small amount of layering fluid is present in the anatomic pelvis. Musculoskeletal: The bone windows demonstrate a vacuum disc at L5-S1. No other focal lesions are present. IMPRESSION: 1. Status post cholecystectomy. 2. Gas and fluid within they surgical bed is likely within normal limits 3 days following surgery. 3. A small amount of fluid is also present about the liver and within the anatomic pelvis, likely within normal limits. 4. Inflammatory changes are present at the level of the hepatic flexure of the colon. This is likely secondary. No discrete abscess is present. 5. Decreasing size of complex peripancreatic fluid collection compatible with pancreatic pseudocyst. The pancreas enhances throughout. 6. Diffuse fatty infiltration of the liver. Electronically Signed   By: Marin Roberts M.D.   On: 03/07/2016 08:42   Dg Chest Port 1 View  03/08/2016  CLINICAL DATA:  Peritonitis EXAM: PORTABLE CHEST 1 VIEW COMPARISON:  Yesterday FINDINGS: Unchanged low volumes with bibasilar atelectasis. No edema, effusion, or air leak. Normal heart size. IMPRESSION: Persistent hypoventilation and basilar atelectasis. Electronically Signed   By: Marnee Spring M.D.   On: 03/08/2016 06:50   Dg Chest Port 1 View  03/07/2016  CLINICAL DATA:  40 year old male with a history of abdominal pain EXAM: PORTABLE  CHEST 1 VIEW COMPARISON:  01/10/2016 FINDINGS: Low lung volumes. Cardiomediastinal silhouette likely unchanged, accentuated by the low lung volumes and positioning. Interstitial markings without confluent airspace disease. Blunting at the left costophrenic angle. No pneumothorax. Interval removal of the right-sided PICC IMPRESSION: Low lung volumes, with interstitial prominence and blunting at the left costophrenic angle. While  these findings are likely secondary to atelectasis and the positioning of this single frontal view, left basilar process not excluded. If there is concern for further evaluation, a dedicated PA and lateral chest x-ray would be useful. Interval removal of right PICC Signed, Yvone NeuJaime S. Loreta AveWagner, DO [facility] Electronically Signed   By: Gilmer MorJaime  Wagner D.O.   On: 03/07/2016 09:25   Dg Ercp  03/16/2016  CLINICAL DATA:  40 year old male with a history of ERCP, choledocholithiasis EXAM: ERCP TECHNIQUE: Multiple spot images obtained with the fluoroscopic device and submitted for interpretation post-procedure. FLUOROSCOPY TIME:  Fluoroscopy Time:  1 minutes 21 seconds COMPARISON:  Nuclear medicine study 03/14/2016, CT 03/07/2016 FINDINGS: Limited fluoroscopic images during ERCP. Initial image demonstrates endoscope projecting over the upper abdomen with guidewire cross the ampulla within the biliary system. There is partial opacification of the extrahepatic biliary ducts. Final image demonstrates placement of a plastic stent at the distal common bile duct. IMPRESSION: Limited images during ERCP demonstrates partial opacification of the ductal system and placement of a plastic biliary stent at the distal common bile duct. Please refer to the dictated operative report for full details of intraoperative findings and procedure. Signed, Yvone NeuJaime S. Loreta AveWagner, DO [facility] Electronically Signed   By: Gilmer MorJaime  Wagner D.O.   On: 03/16/2016 13:24   Dg Abd Portable 2v  03/08/2016  CLINICAL DATA:  Peritonitis EXAM: PORTABLE ABDOMEN - 2 VIEW COMPARISON:  None. FINDINGS: The bowel gas pattern is normal. Oral contrast is seen in nondilated colon. There is no evidence of free air. Bibasilar atelectasis. IMPRESSION: No evidence of pneumoperitoneum or bowel obstruction. Electronically Signed   By: Marnee SpringJonathon  Watts M.D.   On:  03/08/2016 06:51    Labs:  CBC:  Recent Labs  03/12/16 0430 03/15/16 0937 03/19/16 0430 03/21/16 1009 03/22/16 0618  WBC 12.9*  --  15.2* 10.8* 8.6  HGB 11.8* 11.6* 11.1* 11.4* 10.9*  HCT 35.3* 34.0* 33.9* 34.4* 34.0*  PLT 310  --  523* 483* 445*    COAGS:  Recent Labs  01/09/16 1054  INR 1.11  APTT 30    BMP:  Recent Labs  03/20/16 1058 03/21/16 1009 03/22/16 0618 03/22/16 2320  NA 142 143 141 138  K 3.4* 3.3* 3.4* 3.0*  CL 101 103 103 100*  CO2 31 29 27 26   GLUCOSE 108* 114* 106* 101*  BUN 7 5* 5* <5*  CALCIUM 9.0 9.1 9.0 9.0  CREATININE 1.49* 1.30* 1.24 1.06  GFRNONAA 57* >60 >60 >60  GFRAA >60 >60 >60 >60    LIVER FUNCTION TESTS:  Recent Labs  03/19/16 0430 03/20/16 1058 03/21/16 1009 03/22/16 0618  BILITOT 0.7 0.7 1.0 0.8  AST 61* 23 18 21   ALT 29 21 17  15*  ALKPHOS 297* 199* 159* 134*  PROT 6.4* 7.2 7.0 6.7  ALBUMIN 2.7* 3.1* 3.0* 3.0*    TUMOR MARKERS: No results for input(s): AFPTM, CEA, CA199, CHROMGRNA in the last 8760 hours.  Assessment and Plan:  S/P Lap chole for gallstone pancreatitis. Complicated by hematoma/peritonitis and bile leak = s/p 19 fr JP drain by Gen  Surg and ERCP by GI.  Now with GB fossa fluid collection  Needs image guided drainage/drain placement by our service, however patient would like to speak with his wife before signing consent.  Risks and Benefits discussed with the patient including bleeding, infection, damage to adjacent structures, bowel perforation/fistula connection, and sepsis.  All of the patient's questions were answered  Patient will discuss with his wife.  If both are agreeable, will proceed tomorrow (03/26/2016)  Will order NPO after MN and hold lovenox.  Thank you for this interesting consult.  I greatly enjoyed meeting Melvin Whiteford and look forward to participating in their care.  A copy of this report was sent to the requesting provider on this date.  Electronically  Signed: Gwynneth Macleod PA-C 03/25/2016, 10:54 AM   I spent a total of 40 Minutes in face to face in clinical consultation, greater than 50% of which was counseling/coordinating care for CT guided drain placement

## 2016-03-26 ENCOUNTER — Inpatient Hospital Stay (HOSPITAL_COMMUNITY): Payer: BLUE CROSS/BLUE SHIELD

## 2016-03-26 LAB — PROTIME-INR
INR: 1.18 (ref 0.00–1.49)
Prothrombin Time: 15.2 seconds (ref 11.6–15.2)

## 2016-03-26 MED ORDER — LIDOCAINE HCL 1 % IJ SOLN
INTRAMUSCULAR | Status: AC
  Filled 2016-03-26: qty 20

## 2016-03-26 MED ORDER — MIDAZOLAM HCL 2 MG/2ML IJ SOLN
INTRAMUSCULAR | Status: AC | PRN
  Administered 2016-03-26: 1 mg via INTRAVENOUS
  Administered 2016-03-26 (×2): 0.5 mg via INTRAVENOUS

## 2016-03-26 MED ORDER — FENTANYL CITRATE (PF) 100 MCG/2ML IJ SOLN
INTRAMUSCULAR | Status: AC
  Filled 2016-03-26: qty 2

## 2016-03-26 MED ORDER — FENTANYL CITRATE (PF) 100 MCG/2ML IJ SOLN
INTRAMUSCULAR | Status: AC | PRN
  Administered 2016-03-26 (×3): 25 ug via INTRAVENOUS
  Administered 2016-03-26: 50 ug via INTRAVENOUS

## 2016-03-26 MED ORDER — ENOXAPARIN SODIUM 40 MG/0.4ML ~~LOC~~ SOLN
40.0000 mg | SUBCUTANEOUS | Status: DC
  Administered 2016-03-26 – 2016-03-29 (×4): 40 mg via SUBCUTANEOUS
  Filled 2016-03-26 (×4): qty 0.4

## 2016-03-26 MED ORDER — MIDAZOLAM HCL 2 MG/2ML IJ SOLN
INTRAMUSCULAR | Status: AC
  Filled 2016-03-26: qty 2

## 2016-03-26 NOTE — Procedures (Signed)
Successful placement of 10 French drain in gallbladder fossa fluid collection with CT guidance.  Cloudy green fluid was aspirated from drain.  No immediate complication.  Minimal blood loss.

## 2016-03-26 NOTE — Sedation Documentation (Signed)
Patient is resting comfortably. No complaints of pain at this time. 

## 2016-03-26 NOTE — Progress Notes (Signed)
10 Days Post-Op  Subjective: Pt with no acute changes overnight JP drain began to drain new fluid, possible fluid collection seen on CT.  Objective: Vital signs in last 24 hours: Temp:  [99 F (37.2 C)-99.9 F (37.7 C)] 99.9 F (37.7 C) (03/29 2113) Pulse Rate:  [105-121] 121 (03/29 2113) Resp:  [16] 16 (03/29 2113) BP: (129-138)/(72-97) 138/97 mmHg (03/29 2113) SpO2:  [96 %-97 %] 96 % (03/29 2113) Last BM Date: 03/18/16  Intake/Output from previous day: 03/29 0701 - 03/30 0700 In: 1150 [P.O.:600; I.V.:550] Out: 500 [Urine:500] Intake/Output this shift:    General appearance: alert and cooperative Cardio: regular rate and rhythm, S1, S2 normal, no murmur, click, rub or gallop GI: soft, JP in place, thick green drainage  Lab Results:  No results for input(s): WBC, HGB, HCT, PLT in the last 72 hours. BMET No results for input(s): NA, K, CL, CO2, GLUCOSE, BUN, CREATININE, CALCIUM in the last 72 hours. PT/INR  Recent Labs  03/26/16 0456  LABPROT 15.2  INR 1.18   ABG No results for input(s): PHART, HCO3 in the last 72 hours.  Invalid input(s): PCO2, PO2  Studies/Results: No results found.  Anti-infectives: Anti-infectives    Start     Dose/Rate Route Frequency Ordered Stop   03/13/16 1400  metroNIDAZOLE (FLAGYL) tablet 500 mg  Status:  Discontinued     500 mg Oral 3 times per day 03/13/16 1053 03/18/16 1300   03/13/16 1100  ciprofloxacin (CIPRO) tablet 500 mg  Status:  Discontinued     500 mg Oral 2 times daily 03/13/16 1053 03/18/16 1300   03/11/16 1100  ciprofloxacin (CIPRO) IVPB 400 mg  Status:  Discontinued     400 mg 200 mL/hr over 60 Minutes Intravenous Every 24 hours 03/10/16 1319 03/13/16 1053   03/10/16 1930  metroNIDAZOLE (FLAGYL) IVPB 500 mg  Status:  Discontinued     500 mg 100 mL/hr over 60 Minutes Intravenous Every 8 hours 03/10/16 1319 03/13/16 1053   03/09/16 1200  metroNIDAZOLE (FLAGYL) IVPB 500 mg  Status:  Discontinued     500 mg 100 mL/hr  over 60 Minutes Intravenous To Surgery 03/09/16 1145 03/09/16 1543   03/08/16 0600  vancomycin (VANCOCIN) IVPB 1000 mg/200 mL premix  Status:  Discontinued     1,000 mg 200 mL/hr over 60 Minutes Intravenous Every 8 hours 03/08/16 0550 03/09/16 1548   03/07/16 1130  ciprofloxacin (CIPRO) IVPB 400 mg  Status:  Discontinued     400 mg 200 mL/hr over 60 Minutes Intravenous Every 12 hours 03/07/16 1125 03/10/16 1319   03/07/16 1130  metroNIDAZOLE (FLAGYL) IVPB 500 mg  Status:  Discontinued     500 mg 100 mL/hr over 60 Minutes Intravenous Every 8 hours 03/07/16 1125 03/10/16 1319   03/07/16 0530  ceFEPIme (MAXIPIME) 1 g in dextrose 5 % 50 mL IVPB     1 g 100 mL/hr over 30 Minutes Intravenous  Once 03/07/16 0516 03/07/16 0700   03/07/16 0530  metroNIDAZOLE (FLAGYL) IVPB 500 mg     500 mg 100 mL/hr over 60 Minutes Intravenous  Once 03/07/16 0516 03/07/16 0612      Assessment/Plan: s/p Procedure(s): ENDOSCOPIC RETROGRADE CHOLANGIOPANCREATOGRAPHY (ERCP) (N/A) Await RADS eval. And possible drainage of intraabdominal fluid collection.  ?possible decompression from JP as its started putting out new drainage Home tomorrow if con't to do well   LOS: 19 days    Marigene Ehlersamirez Jr., Jed LimerickArmando 03/26/2016

## 2016-03-26 NOTE — Sedation Documentation (Signed)
Patient is resting comfortably. No complaints at this time 

## 2016-03-26 NOTE — Sedation Documentation (Signed)
Pt grimacing 

## 2016-03-26 NOTE — Sedation Documentation (Signed)
Pt grimacing in pain. Reports pain 7/10 at incision site. [facility]

## 2016-03-26 NOTE — Sedation Documentation (Addendum)
Patient is grimacing in pain. Moaning.

## 2016-03-27 NOTE — Progress Notes (Signed)
11 Days Post-Op  Subjective: Pt doing well this AM.  Tol PO. RADS proc went well. Appreciate their help  Objective: Vital signs in last 24 hours: Temp:  [98.1 F (36.7 C)-98.6 F (37 C)] 98.6 F (37 C) (03/30 2110) Pulse Rate:  [98-107] 107 (03/30 2110) Resp:  [11-19] 19 (03/30 2110) BP: (120-137)/(79-86) 137/84 mmHg (03/30 2110) SpO2:  [96 %-100 %] 100 % (03/30 2110) Last BM Date: 03/18/16  Intake/Output from previous day: 03/30 0701 - 03/31 0700 In: 1050.8 [I.V.:1040.8] Out: 2760 [Urine:2500; Drains:260] Intake/Output this shift: Total I/O In: 560 [I.V.:550; Other:10] Out: 945 [Urine:900; Drains:45]  General appearance: alert and cooperative GI: soft, non-tender; bowel sounds normal; no masses,  no organomegaly and JP in place.  1 appears bilious, 2 appears cloudy green, min  Lab Results:  No results for input(s): WBC, HGB, HCT, PLT in the last 72 hours. BMET No results for input(s): NA, K, CL, CO2, GLUCOSE, BUN, CREATININE, CALCIUM in the last 72 hours. PT/INR  Recent Labs  03/26/16 0456  LABPROT 15.2  INR 1.18   ABG No results for input(s): PHART, HCO3 in the last 72 hours.  Invalid input(s): PCO2, PO2  Studies/Results: Ct Image Guided Drainage By Percutaneous Catheter  03/26/2016  INDICATION: 40 year old with history of gallstone pancreatitis and status post laparoscopic cholecystectomy. Complicated postoperative course including a bile leak. Patient has a non metallic biliary stent. The patient now has a large fluid collection in the gallbladder fossa which needs drainage. EXAM: CT GUIDED DRAINAGE OF GALLBLADDER FOSSA FLUID COLLECTION MEDICATIONS: No antibiotics given for this procedure. ANESTHESIA/SEDATION: 2.0 mg IV Versed 100 mcg IV Fentanyl Moderate Sedation Time:  12 minutes The patient was continuously monitored during the procedure by the interventional radiology nurse under my direct supervision. COMPLICATIONS: None immediate. TECHNIQUE: Informed written  consent was obtained from the patient after a thorough discussion of the procedural risks, benefits and alternatives. All questions were addressed. A timeout was performed prior to the initiation of the procedure. PROCEDURE: Patient was placed supine on the CT scanner. Images through the upper abdomen were obtained. The right side of the abdomen was prepped with chlorhexidine and sterile field was created. Skin and soft tissues were anesthetized with 1% lidocaine. 18 gauge trocar needle was directed into the gallbladder fossa collection from a transhepatic approach with CT guidance. Green bilious fluid was aspirated. Stiff Amplatz wire was placed. The tract was dilated to accommodate a 10 [place] multipurpose drain. Initially, approximately 30 mL of cloudy bilious fluid was removed. Catheter was attached to a suction bulb. This fluid was sent for culture. Catheter was sutured to the skin and a dressing was placed. FINDINGS: There is large fluid collection in the gallbladder fossa measuring up to 6.4 cm with small foci of air. Patient has a surgical drain which lies adjacent to this collection. Drain was placed within the gallbladder fossa collection. Cloudy bilious fluid was removed. IMPRESSION: CT-guided placement of a drainage catheter within the gallbladder fossa fluid collection. Fluid was sent for culture. Electronically Signed   By: Richarda OverlieAdam  Henn M.D.   On: 03/26/2016 17:13    Anti-infectives: Anti-infectives    Start     Dose/Rate Route Frequency Ordered Stop   03/13/16 1400  metroNIDAZOLE (FLAGYL) tablet 500 mg  Status:  Discontinued     500 mg Oral 3 times per day 03/13/16 1053 03/18/16 1300   03/13/16 1100  ciprofloxacin (CIPRO) tablet 500 mg  Status:  Discontinued     500 mg Oral 2  times daily 03/13/16 1053 03/18/16 1300   03/11/16 1100  ciprofloxacin (CIPRO) IVPB 400 mg  Status:  Discontinued     400 mg 200 mL/hr over 60 Minutes Intravenous Every 24 hours 03/10/16 1319 03/13/16 1053   03/10/16  1930  metroNIDAZOLE (FLAGYL) IVPB 500 mg  Status:  Discontinued     500 mg 100 mL/hr over 60 Minutes Intravenous Every 8 hours 03/10/16 1319 03/13/16 1053   03/09/16 1200  metroNIDAZOLE (FLAGYL) IVPB 500 mg  Status:  Discontinued     500 mg 100 mL/hr over 60 Minutes Intravenous To Surgery 03/09/16 1145 03/09/16 1543   03/08/16 0600  vancomycin (VANCOCIN) IVPB 1000 mg/200 mL premix  Status:  Discontinued     1,000 mg 200 mL/hr over 60 Minutes Intravenous Every 8 hours 03/08/16 0550 03/09/16 1548   03/07/16 1130  ciprofloxacin (CIPRO) IVPB 400 mg  Status:  Discontinued     400 mg 200 mL/hr over 60 Minutes Intravenous Every 12 hours 03/07/16 1125 03/10/16 1319   03/07/16 1130  metroNIDAZOLE (FLAGYL) IVPB 500 mg  Status:  Discontinued     500 mg 100 mL/hr over 60 Minutes Intravenous Every 8 hours 03/07/16 1125 03/10/16 1319   03/07/16 0530  ceFEPIme (MAXIPIME) 1 g in dextrose 5 % 50 mL IVPB     1 g 100 mL/hr over 30 Minutes Intravenous  Once 03/07/16 0516 03/07/16 0700   03/07/16 0530  metroNIDAZOLE (FLAGYL) IVPB 500 mg     500 mg 100 mL/hr over 60 Minutes Intravenous  Once 03/07/16 0516 03/07/16 0612      Assessment/Plan: s/p Procedure(s): ENDOSCOPIC RETROGRADE CHOLANGIOPANCREATOGRAPHY (ERCP) (N/A) con't pain control Mobilize Resume PO Home possibly in next 2-3days   LOS: 20 days    Marigene Ehlers., [facility] 03/27/2016

## 2016-03-27 NOTE — Progress Notes (Signed)
Referring Physician(s): DR Derrell Lolling  Supervising Physician: Ruel Favors  Chief Complaint:  Lap chole 3/8 GB fossa abscess  Subjective:  Drain placed 3/30 In place Pt does feel some better Output good Pt up in chair eating some   Allergies: Penicillins  Medications: Prior to Admission medications   Medication Sig Start Date End Date Taking? Authorizing Provider  buPROPion (WELLBUTRIN XL) 150 MG 24 hr tablet Take 150 mg by mouth daily.   Yes Historical Provider, MD  ibuprofen (ADVIL,MOTRIN) 200 MG tablet Take 200 mg by mouth every 6 (six) hours as needed for mild pain.   Yes Historical Provider, MD  ondansetron (ZOFRAN-ODT) 4 MG disintegrating tablet Take 1-2 tablets (4-8 mg total) by mouth every 8 (eight) hours as needed for nausea or vomiting. 02/10/16  Yes Ejiroghene E Emokpae, MD  oxyCODONE (OXY IR/ROXICODONE) 5 MG immediate release tablet Take 1-2 tablets (5-10 mg total) by mouth every 8 (eight) hours as needed for severe pain. 02/24/16  Yes Courtney Paris, MD  oxyCODONE-acetaminophen (ROXICET) 5-325 MG tablet Take 1-2 tablets by mouth every 4 (four) hours as needed. 03/04/16  Yes Axel Filler, MD  pantoprazole (PROTONIX) 40 MG tablet Take 1 tablet (40 mg total) by mouth daily. 02/03/16  Yes Selina Cooley, MD     Vital Signs: BP 137/84 mmHg  Pulse 107  Temp(Src) 98.6 F (37 C) (Oral)  Resp 19  Ht  (1.626 m)  Wt 168 lb 10.4 oz (76.5 kg)  BMI 28.93 kg/m2  SpO2 100%  Physical Exam  Abdominal: Soft. Bowel sounds are normal. There is tenderness.  Skin: Skin is warm and dry.  Site of IR drain is clean and dry Tender No bleeding  Output yellow clear now 260 cc yesterday 10 cc in JP  Cx pending afeb  Nursing note and vitals reviewed.   Imaging: Ct Image Guided Drainage By Percutaneous Catheter  03/26/2016  INDICATION: 40 year old with history of gallstone pancreatitis and status post laparoscopic cholecystectomy. Complicated postoperative course  including a bile leak. Patient has a non metallic biliary stent. The patient now has a large fluid collection in the gallbladder fossa which needs drainage. EXAM: CT GUIDED DRAINAGE OF GALLBLADDER FOSSA FLUID COLLECTION MEDICATIONS: No antibiotics given for this procedure. ANESTHESIA/SEDATION: 2.0 mg IV Versed 100 mcg IV Fentanyl Moderate Sedation Time:  12 minutes The patient was continuously monitored during the procedure by the interventional radiology nurse under my direct supervision. COMPLICATIONS: None immediate. TECHNIQUE: Informed written consent was obtained from the patient after a thorough discussion of the procedural risks, benefits and alternatives. All questions were addressed. A timeout was performed prior to the initiation of the procedure. PROCEDURE: Patient was placed supine on the CT scanner. Images through the upper abdomen were obtained. The right side of the abdomen was prepped with chlorhexidine and sterile field was created. Skin and soft tissues were anesthetized with 1% lidocaine. 18 gauge trocar needle was directed into the gallbladder fossa collection from a transhepatic approach with CT guidance. Green bilious fluid was aspirated. Stiff Amplatz wire was placed. The tract was dilated to accommodate a 10 [place] multipurpose drain. Initially, approximately 30 mL of cloudy bilious fluid was removed. Catheter was attached to a suction bulb. This fluid was sent for culture. Catheter was sutured to the skin and a dressing was placed. FINDINGS: There is large fluid collection in the gallbladder fossa measuring up to 6.4 cm with small foci of air. Patient has a surgical drain which lies adjacent to this  collection. Drain was placed within the gallbladder fossa collection. Cloudy bilious fluid was removed. IMPRESSION: CT-guided placement of a drainage catheter within the gallbladder fossa fluid collection. Fluid was sent for culture. Electronically Signed   By: Richarda OverlieAdam  Henn M.D.   On: 03/26/2016  17:13    Labs:  CBC:  Recent Labs  03/12/16 0430 03/15/16 0937 03/19/16 0430 03/21/16 1009 03/22/16 0618  WBC 12.9*  --  15.2* 10.8* 8.6  HGB 11.8* 11.6* 11.1* 11.4* 10.9*  HCT 35.3* 34.0* 33.9* 34.4* 34.0*  PLT 310  --  523* 483* 445*    COAGS:  Recent Labs  01/09/16 1054 03/26/16 0456  INR 1.11 1.18  APTT 30  --     BMP:  Recent Labs  03/20/16 1058 03/21/16 1009 03/22/16 0618 03/22/16 2320  NA 142 143 141 138  K 3.4* 3.3* 3.4* 3.0*  CL 101 103 103 100*  CO2 31 29 27 26   GLUCOSE 108* 114* 106* 101*  BUN 7 5* 5* <5*  CALCIUM 9.0 9.1 9.0 9.0  CREATININE 1.49* 1.30* 1.24 1.06  GFRNONAA 57* >60 >60 >60  GFRAA >60 >60 >60 >60    LIVER FUNCTION TESTS:  Recent Labs  03/19/16 0430 03/20/16 1058 03/21/16 1009 03/22/16 0618  BILITOT 0.7 0.7 1.0 0.8  AST 61* 23 18 21   ALT 29 21 17  15*  ALKPHOS 297* 199* 159* 134*  PROT 6.4* 7.2 7.0 6.7  ALBUMIN 2.7* 3.1* 3.0* 3.0*    Assessment and Plan:  Lap chole 3/8 GB fossa abscess drain placed 3/30 Will follow  Electronically Signed: Franziska Podgurski A 03/27/2016, 8:30 AM   I spent a total of 15 Minutes at the the patient's bedside AND on the patient's hospital floor or unit, greater than 50% of which was counseling/coordinating care for GB fossa abscess drain

## 2016-03-28 NOTE — Progress Notes (Signed)
Notified doctor of jp drain (rlq) not holding charge. Other drain working fine. No orders received.

## 2016-03-28 NOTE — Progress Notes (Signed)
[facility]:  [phone] General Surgery Progress Note   LOS: 21 days  POD -  12 Days Post-Op  Assessment/Plan: 1.  Bile leak  Perc drain - 03/26/2016 - Henn  ENDOSCOPIC RETROGRADE, CHOLANGIOPANCREATOGRAPHY (ERCP) - 03/16/2016 - Russella Dar  He actually looks okay.  He could go home today or tomorrow, but he is under the impression that Dr. Derrell Lolling said he would stay until Monday.  His primary issue is pain control.  Needs to ambulate more.  2.  LAPAROSCOPIC CHOLECYSTECTOMY - 03/04/2016 - Derrell Lolling  LAPAROSCOPIC EVACUATION OF HEMATOMA AND WASHOUT - 03/09/2016 - Derrell Lolling   3.  DVT prophylaxis - Lovenox   Active Problems:   Peritonitis (HCC)   Bile leak   Generalized abdominal pain   Abnormal biliary HIDA scan  Subjective:  Not ready to go home.  Expects to stay until Monday, 4/3.  Objective:   Filed Vitals:   03/27/16 2100 03/28/16 0444  BP: 128/83 112/75  Pulse: 109 102  Temp: 98.8 F (37.1 C) 98.1 F (36.7 C)  Resp: 17 17     Intake/Output from previous day:  03/31 0701 - 04/01 0700 In: 1865 [P.O.:1200; I.V.:650] Out: 0981 [Urine:6800; Drains:97]  Intake/Output this shift:      Physical Exam:   General: WN male who is alert and oriented.    HEENT: Normal. Pupils equal. .   Lungs: Clear   Abdomen: Soft.  Has BS   Wound: Drains - 1/2 - 2/95 cc recorded last 24 hours     Lab Results:   No results for input(s): WBC, HGB, HCT, PLT in the last 72 hours.  BMET  No results for input(s): NA, K, CL, CO2, GLUCOSE, BUN, CREATININE, CALCIUM in the last 72 hours.  PT/INR   Recent Labs  03/26/16 0456  LABPROT 15.2  INR 1.18    ABG  No results for input(s): PHART, HCO3 in the last 72 hours.  Invalid input(s): PCO2, PO2   Studies/Results:  Ct Image Guided Drainage By Percutaneous Catheter  03/26/2016  INDICATION: 40 year old with history of gallstone pancreatitis and status post laparoscopic cholecystectomy. Complicated postoperative course including  a bile leak. Patient has a non metallic biliary stent. The patient now has a large fluid collection in the gallbladder fossa which needs drainage. EXAM: CT GUIDED DRAINAGE OF GALLBLADDER FOSSA FLUID COLLECTION MEDICATIONS: No antibiotics given for this procedure. ANESTHESIA/SEDATION: 2.0 mg IV Versed 100 mcg IV Fentanyl Moderate Sedation Time:  12 minutes The patient was continuously monitored during the procedure by the interventional radiology nurse under my direct supervision. COMPLICATIONS: None immediate. TECHNIQUE: Informed written consent was obtained from the patient after a thorough discussion of the procedural risks, benefits and alternatives. All questions were addressed. A timeout was performed prior to the initiation of the procedure. PROCEDURE: Patient was placed supine on the CT scanner. Images through the upper abdomen were obtained. The right side of the abdomen was prepped with chlorhexidine and sterile field was created. Skin and soft tissues were anesthetized with 1% lidocaine. 18 gauge trocar needle was directed into the gallbladder fossa collection from a transhepatic approach with CT guidance. Green bilious fluid was aspirated. Stiff Amplatz wire was placed. The tract was dilated to accommodate a 10 [place] multipurpose drain. Initially, approximately 30 mL of cloudy bilious fluid was removed. Catheter was attached to a suction bulb. This fluid was sent for culture. Catheter was sutured to the skin and a dressing was placed. FINDINGS: There is large fluid collection in the gallbladder  fossa measuring up to 6.4 cm with small foci of air. Patient has a surgical drain which lies adjacent to this collection. Drain was placed within the gallbladder fossa collection. Cloudy bilious fluid was removed. IMPRESSION: CT-guided placement of a drainage catheter within the gallbladder fossa fluid collection. Fluid was sent for culture. Electronically Signed   By: Richarda OverlieAdam  Henn M.D.   On: 03/26/2016 17:13      Anti-infectives:   Anti-infectives    Start     Dose/Rate Route Frequency Ordered Stop   03/13/16 1400  metroNIDAZOLE (FLAGYL) tablet 500 mg  Status:  Discontinued     500 mg Oral 3 times per day 03/13/16 1053 03/18/16 1300   03/13/16 1100  ciprofloxacin (CIPRO) tablet 500 mg  Status:  Discontinued     500 mg Oral 2 times daily 03/13/16 1053 03/18/16 1300   03/11/16 1100  ciprofloxacin (CIPRO) IVPB 400 mg  Status:  Discontinued     400 mg 200 mL/hr over 60 Minutes Intravenous Every 24 hours 03/10/16 1319 03/13/16 1053   03/10/16 1930  metroNIDAZOLE (FLAGYL) IVPB 500 mg  Status:  Discontinued     500 mg 100 mL/hr over 60 Minutes Intravenous Every 8 hours 03/10/16 1319 03/13/16 1053   03/09/16 1200  metroNIDAZOLE (FLAGYL) IVPB 500 mg  Status:  Discontinued     500 mg 100 mL/hr over 60 Minutes Intravenous To Surgery 03/09/16 1145 03/09/16 1543   03/08/16 0600  vancomycin (VANCOCIN) IVPB 1000 mg/200 mL premix  Status:  Discontinued     1,000 mg 200 mL/hr over 60 Minutes Intravenous Every 8 hours 03/08/16 0550 03/09/16 1548   03/07/16 1130  ciprofloxacin (CIPRO) IVPB 400 mg  Status:  Discontinued     400 mg 200 mL/hr over 60 Minutes Intravenous Every 12 hours 03/07/16 1125 03/10/16 1319   03/07/16 1130  metroNIDAZOLE (FLAGYL) IVPB 500 mg  Status:  Discontinued     500 mg 100 mL/hr over 60 Minutes Intravenous Every 8 hours 03/07/16 1125 03/10/16 1319   03/07/16 0530  ceFEPIme (MAXIPIME) 1 g in dextrose 5 % 50 mL IVPB     1 g 100 mL/hr over 30 Minutes Intravenous  Once 03/07/16 0516 03/07/16 0700   03/07/16 0530  metroNIDAZOLE (FLAGYL) IVPB 500 mg     500 mg 100 mL/hr over 60 Minutes Intravenous  Once 03/07/16 0516 03/07/16 0612      Ovidio Kinavid Carolan Avedisian, MD, FACS Pager: [phone] [facility]: [phone] 03/28/2016

## 2016-03-28 NOTE — Progress Notes (Signed)
Patient ID: Drew Prince, male   DOB: 06/25/1976, 40 y.o.   MRN: [id number]    Referring Physician(s): Ramirez,Armando  Supervising Physician: Ruel Favors  Chief Complaint:  GB fossa fluid collection  Subjective:  Pt doing ok today; no new c/o  Allergies: Penicillins  Medications: Prior to Admission medications   Medication Sig Start Date End Date Taking? Authorizing Provider  buPROPion (WELLBUTRIN XL) 150 MG 24 hr tablet Take 150 mg by mouth daily.   Yes Historical Provider, MD  ibuprofen (ADVIL,MOTRIN) 200 MG tablet Take 200 mg by mouth every 6 (six) hours as needed for mild pain.   Yes Historical Provider, MD  ondansetron (ZOFRAN-ODT) 4 MG disintegrating tablet Take 1-2 tablets (4-8 mg total) by mouth every 8 (eight) hours as needed for nausea or vomiting. 02/10/16  Yes Ejiroghene E Emokpae, MD  oxyCODONE (OXY IR/ROXICODONE) 5 MG immediate release tablet Take 1-2 tablets (5-10 mg total) by mouth every 8 (eight) hours as needed for severe pain. 02/24/16  Yes Courtney Paris, MD  oxyCODONE-acetaminophen (ROXICET) 5-325 MG tablet Take 1-2 tablets by mouth every 4 (four) hours as needed. 03/04/16  Yes Axel Filler, MD  pantoprazole (PROTONIX) 40 MG tablet Take 1 tablet (40 mg total) by mouth daily. 02/03/16  Yes Selina Cooley, MD     Vital Signs: BP 134/88 mmHg  Pulse 102  Temp(Src) 98.1 F (36.7 C) (Oral)  Resp 17  Ht  (1.626 m)  Wt 168 lb 10.4 oz (76.5 kg)  BMI 28.93 kg/m2  SpO2 96%  Physical Exam GB fossa drain intact, insertion site ok, mildly tender; output 95 cc yellow fluid with tissue fragments; cx's- few candida  Imaging: Ct Image Guided Drainage By Percutaneous Catheter  03/26/2016  INDICATION: 40 year old with history of gallstone pancreatitis and status post laparoscopic cholecystectomy. Complicated postoperative course including a bile leak. Patient has a non metallic biliary stent. The patient now has a large fluid collection in the gallbladder fossa which  needs drainage. EXAM: CT GUIDED DRAINAGE OF GALLBLADDER FOSSA FLUID COLLECTION MEDICATIONS: No antibiotics given for this procedure. ANESTHESIA/SEDATION: 2.0 mg IV Versed 100 mcg IV Fentanyl Moderate Sedation Time:  12 minutes The patient was continuously monitored during the procedure by the interventional radiology nurse under my direct supervision. COMPLICATIONS: None immediate. TECHNIQUE: Informed written consent was obtained from the patient after a thorough discussion of the procedural risks, benefits and alternatives. All questions were addressed. A timeout was performed prior to the initiation of the procedure. PROCEDURE: Patient was placed supine on the CT scanner. Images through the upper abdomen were obtained. The right side of the abdomen was prepped with chlorhexidine and sterile field was created. Skin and soft tissues were anesthetized with 1% lidocaine. 18 gauge trocar needle was directed into the gallbladder fossa collection from a transhepatic approach with CT guidance. Green bilious fluid was aspirated. Stiff Amplatz wire was placed. The tract was dilated to accommodate a 10 [place] multipurpose drain. Initially, approximately 30 mL of cloudy bilious fluid was removed. Catheter was attached to a suction bulb. This fluid was sent for culture. Catheter was sutured to the skin and a dressing was placed. FINDINGS: There is large fluid collection in the gallbladder fossa measuring up to 6.4 cm with small foci of air. Patient has a surgical drain which lies adjacent to this collection. Drain was placed within the gallbladder fossa collection. Cloudy bilious fluid was removed. IMPRESSION: CT-guided placement of a drainage catheter within the gallbladder fossa fluid collection. Fluid was sent for  culture. Electronically Signed   By: Richarda OverlieAdam  Henn M.D.   On: 03/26/2016 17:13    Labs:  CBC:  Recent Labs  03/12/16 0430 03/15/16 0937 03/19/16 0430 03/21/16 1009 03/22/16 0618  WBC 12.9*  --  15.2*  10.8* 8.6  HGB 11.8* 11.6* 11.1* 11.4* 10.9*  HCT 35.3* 34.0* 33.9* 34.4* 34.0*  PLT 310  --  523* 483* 445*    COAGS:  Recent Labs  01/09/16 1054 03/26/16 0456  INR 1.11 1.18  APTT 30  --     BMP:  Recent Labs  03/20/16 1058 03/21/16 1009 03/22/16 0618 03/22/16 2320  NA 142 143 141 138  K 3.4* 3.3* 3.4* 3.0*  CL 101 103 103 100*  CO2 31 29 27 26   GLUCOSE 108* 114* 106* 101*  BUN 7 5* 5* <5*  CALCIUM 9.0 9.1 9.0 9.0  CREATININE 1.49* 1.30* 1.24 1.06  GFRNONAA 57* >60 >60 >60  GFRAA >60 >60 >60 >60    LIVER FUNCTION TESTS:  Recent Labs  03/19/16 0430 03/20/16 1058 03/21/16 1009 03/22/16 0618  BILITOT 0.7 0.7 1.0 0.8  AST 61* 23 18 21   ALT 29 21 17  15*  ALKPHOS 297* 199* 159* 134*  PROT 6.4* 7.2 7.0 6.7  ALBUMIN 2.7* 3.1* 3.0* 3.0*    Assessment and Plan: S/p GB fossa fluid collection drainage 3/30 (post cholecystectomy 3/8); AF; no new lab data; fluid cx's- few candida albicans; cont drain irrigation; monitor output closely; OOB; once output minimal obtain f/u CT /drain injection - if d/c'd home with drain can f/u in IR drain clinic 1 week post d/c  Electronically Signed: D. Jeananne RamaKevin Artur Winningham 03/28/2016, 11:03 AM   I spent a total of 15 minutes at the the patient's bedside AND on the patient's hospital floor or unit, greater than 50% of which was counseling/coordinating care for GB fossa fluid collection drain

## 2016-03-29 LAB — CULTURE, ROUTINE-ABSCESS: GRAM STAIN: NONE SEEN

## 2016-03-29 MED ORDER — POLYETHYLENE GLYCOL 3350 17 G PO PACK
17.0000 g | PACK | Freq: Two times a day (BID) | ORAL | Status: DC | PRN
  Administered 2016-03-29: 17 g via ORAL
  Filled 2016-03-29: qty 1

## 2016-03-29 NOTE — Progress Notes (Signed)
[facility]:  [phone] General Surgery Progress Note   LOS: 22 days  POD -  13 Days Post-Op  Assessment/Plan: 1.  Bile leak  Perc drain - 03/26/2016 - Henn  ENDOSCOPIC RETROGRADE, CHOLANGIOPANCREATOGRAPHY (ERCP) - 03/16/2016 - Russella DarStark  He is under the impression that Dr. Derrell Lollingamirez said he would stay until Monday.  His primary issue is pain control.  Looks okay.  Still complains of abdominal pain.  Surgical drain removed because of cracked tubing, not holding charge, and no drainage.  2.  LAPAROSCOPIC CHOLECYSTECTOMY - 03/04/2016 - Derrell Lollingamirez  LAPAROSCOPIC EVACUATION OF HEMATOMA AND WASHOUT - 03/09/2016 - Derrell Lollingamirez   3.  DVT prophylaxis - Lovenox   Active Problems:   Peritonitis (HCC)   Bile leak   Generalized abdominal pain   Abnormal biliary HIDA scan  Subjective:  He expects to stay until Monday, 4/3.  No questions.  Objective:   Filed Vitals:   03/28/16 2111 03/29/16 0519  BP: 129/83 128/86  Pulse: 109 97  Temp: 98.2 F (36.8 C) 98.5 F (36.9 C)  Resp: 19 19     Intake/Output from previous day:  04/01 0701 - 04/02 0700 In: 1340 [P.O.:240; I.V.:1095] Out: 3650 [Urine:3550; Drains:100]  Intake/Output this shift:      Physical Exam:   General: WN male who is alert and oriented.    HEENT: Normal. Pupils equal. .   Lungs: Clear   Abdomen: Soft.  Has BS   Wound: Drains - 1/2 - 0/100 cc recorded last 24 hours.  The surgical drain was not holding a charge.  The tubing was cracked about 8 cm above the exit - so I removed the tube.   Lab Results:   No results for input(s): WBC, HGB, HCT, PLT in the last 72 hours.  BMET  No results for input(s): NA, K, CL, CO2, GLUCOSE, BUN, CREATININE, CALCIUM in the last 72 hours.  PT/INR  No results for input(s): LABPROT, INR in the last 72 hours.  ABG  No results for input(s): PHART, HCO3 in the last 72 hours.  Invalid input(s): PCO2, PO2   Studies/Results:  No results  found.   Anti-infectives:   Anti-infectives    Start     Dose/Rate Route Frequency Ordered Stop   03/13/16 1400  metroNIDAZOLE (FLAGYL) tablet 500 mg  Status:  Discontinued     500 mg Oral 3 times per day 03/13/16 1053 03/18/16 1300   03/13/16 1100  ciprofloxacin (CIPRO) tablet 500 mg  Status:  Discontinued     500 mg Oral 2 times daily 03/13/16 1053 03/18/16 1300   03/11/16 1100  ciprofloxacin (CIPRO) IVPB 400 mg  Status:  Discontinued     400 mg 200 mL/hr over 60 Minutes Intravenous Every 24 hours 03/10/16 1319 03/13/16 1053   03/10/16 1930  metroNIDAZOLE (FLAGYL) IVPB 500 mg  Status:  Discontinued     500 mg 100 mL/hr over 60 Minutes Intravenous Every 8 hours 03/10/16 1319 03/13/16 1053   03/09/16 1200  metroNIDAZOLE (FLAGYL) IVPB 500 mg  Status:  Discontinued     500 mg 100 mL/hr over 60 Minutes Intravenous To Surgery 03/09/16 1145 03/09/16 1543   03/08/16 0600  vancomycin (VANCOCIN) IVPB 1000 mg/200 mL premix  Status:  Discontinued     1,000 mg 200 mL/hr over 60 Minutes Intravenous Every 8 hours 03/08/16 0550 03/09/16 1548   03/07/16 1130  ciprofloxacin (CIPRO) IVPB 400 mg  Status:  Discontinued     400 mg 200 mL/hr over  60 Minutes Intravenous Every 12 hours 03/07/16 1125 03/10/16 1319   03/07/16 1130  metroNIDAZOLE (FLAGYL) IVPB 500 mg  Status:  Discontinued     500 mg 100 mL/hr over 60 Minutes Intravenous Every 8 hours 03/07/16 1125 03/10/16 1319   03/07/16 0530  ceFEPIme (MAXIPIME) 1 g in dextrose 5 % 50 mL IVPB     1 g 100 mL/hr over 30 Minutes Intravenous  Once 03/07/16 0516 03/07/16 0700   03/07/16 0530  metroNIDAZOLE (FLAGYL) IVPB 500 mg     500 mg 100 mL/hr over 60 Minutes Intravenous  Once 03/07/16 0516 03/07/16 0612      Ovidio Kin, MD, FACS Pager: [phone] [facility]: [phone] 03/29/2016

## 2016-03-30 MED ORDER — OXYCODONE-ACETAMINOPHEN 5-325 MG PO TABS: 1.0000 | ORAL_TABLET | ORAL | Status: DC | PRN

## 2016-03-30 NOTE — Discharge Summary (Signed)
Physician Discharge Summary  Patient ID: Drew Prince MRN: [id number] DOB/AGE: [birth year] 39 y.o.  Admit date: 03/07/2016 Discharge date: 03/30/2016  Admission Diagnoses:Peritonitis  Discharge Diagnoses:  Active Problems:   Peritonitis (HCC)   Bile leak   Generalized abdominal pain   Abnormal biliary HIDA scan   Discharged Condition: good  Hospital Course: Patient was admitted with peritonitis. Patient was observed for the first several days secondary to a negative HIDA scan. The working diagnosis was a bile leak.. Secondary to his physical exam and pain patient was taken to the operating room on hospital day #3. Please see operative note for full details.  Patient had what appeared to be acute renal failure. Nephrology was consult. This resolved during his hospital stay was likely due to contrast load with initial CT scan.  Postoperatively patient slowly progressed. Patient continued with abdominal pain. HIDA scan was repeated. HIDA scan revealed a bile leak. ERCP was scheduled in common bile duct stent placement was placed. Patient was continued on antibiotics from admission. Patient was slowly transitioned to a low-fat diet. Patient underwent CT scan which revealed a resolution/decreasing fluid collection consistent with a pseudocyst.  Secondary to the patient's failure to progress. Patient underwent a CT scan. CT scan revealed a fluid collection in his gallbladder fossa. Patient was scheduled for IR drain placement.  Patient continue with a regular diet. Pain was controlled. Patient left torso within normal limits. Patient was thus discharged with a drain in place.    Consults: nephrology and GI and IR  Significant Diagnostic Studies: HIDA scan #1 negative. HIDA scan #2 positive for bile leak. CT scan revealed resolution/decreasing fluid collection that was consistent with a pseudocyst. Follow-up CT scan revealed fluid collection in his gallbladder fossa. This was drained by  IR.  Treatments: Surgery as above, IR placement of drain, GI placement of common bile duct stent. Discharge Exam: Blood pressure 125/88, pulse 94, temperature 98.5 F (36.9 C), temperature source Oral, resp. rate 18, height 5\' 4"  (1.626 m), weight 76.5 kg (168 lb 10.4 oz), SpO2 97 %. General appearance: alert and cooperative GI: soft, non-tender; bowel sounds normal; no masses,  no organomegaly  Disposition: 01-Home or Self Care  Discharge Instructions    Diet - low sodium heart healthy    Complete by:  As directed      Increase activity slowly    Complete by:  As directed             Medication List    TAKE these medications        buPROPion 150 MG 24 hr tablet  Commonly known as:  WELLBUTRIN XL  Take 150 mg by mouth daily.     ibuprofen 200 MG tablet  Commonly known as:  ADVIL,MOTRIN  Take 200 mg by mouth every 6 (six) hours as needed for mild pain.     ondansetron 4 MG disintegrating tablet  Commonly known as:  ZOFRAN-ODT  Take 1-2 tablets (4-8 mg total) by mouth every 8 (eight) hours as needed for nausea or vomiting.     oxyCODONE 5 MG immediate release tablet  Commonly known as:  Oxy IR/ROXICODONE  Take 1-2 tablets (5-10 mg total) by mouth every 8 (eight) hours as needed for severe pain.     oxyCODONE-acetaminophen 5-325 MG tablet  Commonly known as:  ROXICET  Take 1-2 tablets by mouth every 4 (four) hours as needed.     oxyCODONE-acetaminophen 5-325 MG tablet  Commonly known as:  PERCOCET/ROXICET  Take 1-2 tablets  by mouth every 4 (four) hours as needed for severe pain.     pantoprazole 40 MG tablet  Commonly known as:  PROTONIX  Take 1 tablet (40 mg total) by mouth daily.           Follow-up Information    Follow up with [facility].   Why:  Please call this number, follow the prompts to get a Primary Care Physician   Contact information:   [phone]      Signed: Marigene Ehlers., Jed Limerick 03/30/2016, 2:03 PM

## 2016-03-30 NOTE — Discharge Instructions (Signed)
Bulb Drain Home Care °A bulb drain consists of a thin rubber tube and a soft, round bulb that creates a gentle suction. The rubber tube is placed in the area where you had surgery. A bulb is attached to the end of the tube that is outside the body. The bulb drain removes excess fluid that normally builds up in a surgical wound after surgery. The color and amount of fluid will vary. Immediately after surgery, the fluid is bright red and is a little thicker than water. It may gradually change to a yellow or pink color and become more thin and water-like. When the amount decreases to about 1 or 2 tbsp in 24 hours, your health care provider will usually remove it. °DAILY CARE °· Keep the bulb flat (compressed) at all times, except while emptying it. The flatness creates suction. You can flatten the bulb by squeezing it firmly in the middle and then closing the cap. °· Keep sites where the tube enters the skin dry and covered with a bandage (dressing). °· Secure the tube 1-2 in (2.5-5.1 cm) below the insertion sites to keep it from pulling on your stitches. The tube is stitched in place and will not slip out. °· Secure the bulb as directed by your health care provider. °· For the first 3 days after surgery, there usually is more fluid in the bulb. Empty the bulb whenever it becomes half full because the bulb does not create enough suction if it is too full. The bulb could also overflow. Write down how much fluid you remove each time you empty your drain. Add up the amount removed in 24 hours. °· Empty the bulb at the same time every day once the amount of fluid decreases and you only need to empty it once a day. Write down the amounts and the 24-hour totals to give to your health care provider. This helps your health care provider know when the tubes can be removed. °EMPTYING THE BULB DRAIN °Before emptying the bulb, get a measuring cup, a piece of paper and a pen, and wash your hands. °· Gently run your fingers down the  tube (stripping) to empty any drainage from the tubing into the bulb. This may need to be done several times a day to clear the tubing of clots and tissue. °· Open the bulb cap to release suction, which causes it to inflate. Do not touch the inside of the cap. °· Gently run your fingers down the tube (stripping) to empty any drainage from the tubing into the bulb. °· Hold the cap out of the way, and pour fluid into the measuring cup.   °· Squeeze the bulb to provide suction.  °· Replace the cap.   °· Check the tape that holds the tube to your skin. If it is becoming loose, you can remove the loose piece of tape and apply a new one. Then, pin the bulb to your shirt.   °· Write down the amount of fluid you emptied out. Write down the date and each time you emptied your bulb drain. (If there are 2 bulbs, note the amount of drainage from each bulb and keep the totals separate. Your health care provider will want to know the total amounts for each drain and which tube is draining more.)   °· Flush the fluid down the toilet and wash your hands.   °· Call your health care provider once you have less than 2 tbsp of fluid collecting in the bulb drain every 24 hours. °If   there is drainage around the tube site, change dressings and keep the area dry. Cleanse around tube with sterile saline and place dry gauze around site. This gauze should be changed when it is soiled. If it stays clean and unsoiled, it should still be changed daily.  °SEEK MEDICAL CARE IF: °· Your drainage has a bad smell or is cloudy.   °· You have a fever.   °· Your drainage is increasing instead of decreasing.   °· Your tube fell out.   °· You have redness or swelling around the tube site.   °· You have drainage from a surgical wound.   °· Your bulb drain will not stay flat after you empty it.   °MAKE SURE YOU:  °· Understand these instructions. °· Will watch your condition. °· Will get help right away if you are not doing well or get worse. °  °This  information is not intended to replace advice given to you by your health care provider. Make sure you discuss any questions you have with your health care provider. °  °Document Released: 12/11/2000 Document Revised: 01/04/2015 Document Reviewed: 07/03/2015 °[employer] Interactive Patient Education ©2016 [employer] ° °

## 2016-03-31 ENCOUNTER — Other Ambulatory Visit: Payer: Self-pay | Admitting: General Surgery

## 2016-03-31 DIAGNOSIS — T814XXD Infection following a procedure, subsequent encounter: Principal | ICD-10-CM

## 2016-03-31 DIAGNOSIS — K651 Peritoneal abscess: Principal | ICD-10-CM

## 2016-03-31 DIAGNOSIS — IMO0001 Reserved for inherently not codable concepts without codable children: Secondary | ICD-10-CM

## 2016-04-01 ENCOUNTER — Other Ambulatory Visit: Payer: Self-pay | Admitting: General Surgery

## 2016-04-01 DIAGNOSIS — IMO0001 Reserved for inherently not codable concepts without codable children: Secondary | ICD-10-CM

## 2016-04-01 DIAGNOSIS — K651 Peritoneal abscess: Principal | ICD-10-CM

## 2016-04-01 DIAGNOSIS — T814XXA Infection following a procedure, initial encounter: Principal | ICD-10-CM

## 2016-04-03 ENCOUNTER — Emergency Department (HOSPITAL_COMMUNITY)
Admission: EM | Admit: 2016-04-03 | Discharge: 2016-04-03 | Disposition: A | Discharge status: Home / Self Care | Payer: BLUE CROSS/BLUE SHIELD | Attending: Emergency Medicine | Admitting: Emergency Medicine

## 2016-04-03 ENCOUNTER — Emergency Department (HOSPITAL_COMMUNITY): Payer: BLUE CROSS/BLUE SHIELD

## 2016-04-03 ENCOUNTER — Encounter (HOSPITAL_COMMUNITY): Payer: Self-pay | Admitting: Emergency Medicine

## 2016-04-03 DIAGNOSIS — Z9049 Acquired absence of other specified parts of digestive tract: Secondary | ICD-10-CM | POA: Insufficient documentation

## 2016-04-03 DIAGNOSIS — R1012 Left upper quadrant pain: Secondary | ICD-10-CM | POA: Diagnosis present

## 2016-04-03 DIAGNOSIS — K219 Gastro-esophageal reflux disease without esophagitis: Secondary | ICD-10-CM | POA: Diagnosis not present

## 2016-04-03 DIAGNOSIS — K9189 Other postprocedural complications and disorders of digestive system: Secondary | ICD-10-CM | POA: Diagnosis not present

## 2016-04-03 DIAGNOSIS — K5900 Constipation, unspecified: Secondary | ICD-10-CM | POA: Insufficient documentation

## 2016-04-03 DIAGNOSIS — I10 Essential (primary) hypertension: Secondary | ICD-10-CM | POA: Insufficient documentation

## 2016-04-03 DIAGNOSIS — R109 Unspecified abdominal pain: Secondary | ICD-10-CM

## 2016-04-03 DIAGNOSIS — Z79899 Other long term (current) drug therapy: Secondary | ICD-10-CM | POA: Diagnosis not present

## 2016-04-03 DIAGNOSIS — R112 Nausea with vomiting, unspecified: Secondary | ICD-10-CM | POA: Insufficient documentation

## 2016-04-03 DIAGNOSIS — F329 Major depressive disorder, single episode, unspecified: Secondary | ICD-10-CM | POA: Insufficient documentation

## 2016-04-03 DIAGNOSIS — Z880 Allergy status to penicillin: Secondary | ICD-10-CM | POA: Insufficient documentation

## 2016-04-03 DIAGNOSIS — K838 Other specified diseases of biliary tract: Secondary | ICD-10-CM

## 2016-04-03 LAB — CBC
HEMATOCRIT: 35.2 % — AB (ref 39.0–52.0)
Hemoglobin: 11.4 g/dL — ABNORMAL LOW (ref 13.0–17.0)
MCH: 25 pg — ABNORMAL LOW (ref 26.0–34.0)
MCHC: 32.4 g/dL (ref 30.0–36.0)
MCV: 77.2 fL — ABNORMAL LOW (ref 78.0–100.0)
PLATELETS: 512 10*3/uL — AB (ref 150–400)
RBC: 4.56 MIL/uL (ref 4.22–5.81)
RDW: 13.9 % (ref 11.5–15.5)
WBC: 6.9 10*3/uL (ref 4.0–10.5)

## 2016-04-03 LAB — COMPREHENSIVE METABOLIC PANEL
ALBUMIN: 4 g/dL (ref 3.5–5.0)
ALK PHOS: 104 U/L (ref 38–126)
ALT: 22 U/L (ref 17–63)
AST: 31 U/L (ref 15–41)
Anion gap: 13 (ref 5–15)
BILIRUBIN TOTAL: 0.8 mg/dL (ref 0.3–1.2)
BUN: 7 mg/dL (ref 6–20)
CALCIUM: 9.7 mg/dL (ref 8.9–10.3)
CO2: 28 mmol/L (ref 22–32)
Chloride: 98 mmol/L — ABNORMAL LOW (ref 101–111)
Creatinine, Ser: 1.05 mg/dL (ref 0.61–1.24)
GFR calc Af Amer: >60 mL/min (ref >60)
GLUCOSE: 141 mg/dL — AB (ref 65–99)
POTASSIUM: 3.9 mmol/L (ref 3.5–5.1)
Sodium: 139 mmol/L (ref 135–145)
TOTAL PROTEIN: 8.2 g/dL — AB (ref 6.5–8.1)

## 2016-04-03 LAB — URINALYSIS, ROUTINE W REFLEX MICROSCOPIC
BILIRUBIN URINE: NEGATIVE
Glucose, UA: NEGATIVE mg/dL
Hgb urine dipstick: NEGATIVE
KETONES UR: NEGATIVE mg/dL
LEUKOCYTES UA: NEGATIVE
NITRITE: NEGATIVE
PROTEIN: NEGATIVE mg/dL
Specific Gravity, Urine: 1.005 (ref 1.005–1.030)
pH: 6.5 (ref 5.0–8.0)

## 2016-04-03 LAB — LIPASE, BLOOD: Lipase: 36 U/L (ref 11–51)

## 2016-04-03 MED ORDER — OXYCODONE-ACETAMINOPHEN 5-325 MG PO TABS: 1.0000 | ORAL_TABLET | ORAL | Status: DC | PRN

## 2016-04-03 MED ORDER — HYDROMORPHONE HCL 1 MG/ML IJ SOLN: 1.0000 mg | INTRAMUSCULAR | Status: DC | PRN

## 2016-04-03 MED ORDER — IOPAMIDOL (ISOVUE-300) INJECTION 61%
INTRAVENOUS | Status: AC
  Administered 2016-04-03: 100 mL via INTRAVENOUS
  Filled 2016-04-03: qty 100

## 2016-04-03 MED ORDER — ONDANSETRON HCL 4 MG/2ML IJ SOLN
4.0000 mg | Freq: Once | INTRAMUSCULAR | Status: AC | PRN
  Administered 2016-04-03: 4 mg via INTRAVENOUS
  Filled 2016-04-03: qty 2

## 2016-04-03 MED ORDER — POLYETHYLENE GLYCOL 3350 17 G PO PACK: 17.0000 g | PACK | Freq: Every day | ORAL | Status: DC

## 2016-04-03 NOTE — H&P (Signed)
Chief Complaint: abdominal pain HPI: Drew Prince is a 40 year male s/p cholecystectomy 03/04/16 complicated by a bile leak and post ERCP pancreatitis with a pseudocyst.  He was discharged home on 4/3 and presents today with gradually worsening abdominal pain and 1 episode of emesis this morning.  Denies fevers.  Endorses to chills.  Last bowel movement was on Wednesday.  Denies shortness of breath or dyspnea on exertion.  Denies chest pains.  Has a RUQ drain which has been putting out about 30cc per day, but endorses to cloudy and foul smelling drainage.      Past Medical History  Diagnosis Date  . Hypercholesteremia   . Hypertension     01-05-16 to 02-03-16 Hospitalized for pancreatitis, B/P med discontinued during that time.  . H/O seasonal allergies     OTC med used  . Depression   . GERD (gastroesophageal reflux disease)   . Acute pancreatitis     1-8-17to 02-03-16 Hospital stay at [facility].    Past Surgical History  Procedure Laterality Date  . Wisdom tooth extraction      "local only"  . Cholecystectomy N/A 03/04/2016    Procedure: LAPAROSCOPIC CHOLECYSTECTOMY ;  Surgeon: Axel Filler, MD;  Location: [facility] ORS;  Service: General;  Laterality: N/A;  . Laparoscopy N/A 03/09/2016    Procedure: LAPAROSCOPY DIAGNOSTIC;  Surgeon: Axel Filler, MD;  Location: [facility] OR;  Service: General;  Laterality: N/A;  . Hematoma evacuation  03/09/2016    Procedure: LAPAROSCOPIC EVACUATION OF HEMATOMA;  Surgeon: Axel Filler, MD;  Location: [facility] OR;  Service: General;;  . Ercp N/A 03/16/2016    Procedure: ENDOSCOPIC RETROGRADE CHOLANGIOPANCREATOGRAPHY (ERCP);  Surgeon: Meryl Dare, MD;  Location: [facility] ENDOSCOPY;  Service: Endoscopy;  Laterality: N/A;    Family History  Problem Relation Age of Onset  . Hypertension Father    Social History:  reports that he has never smoked. He does not have any smokeless tobacco history on file. He reports that he drinks alcohol. He reports that he does not use illicit  drugs.  Allergies:  Allergies  Allergen Reactions  . Penicillins Rash    .Has patient had a PCN reaction causing immediate rash, facial/tongue/throat swelling, SOB or lightheadedness with hypotension: No Has patient had a PCN reaction causing severe rash involving mucus membranes or skin necrosis: No Has patient had a PCN reaction that required hospitalization No Has patient had a PCN reaction occurring within the last 10 years: No If all of the above answers are "NO", then may proceed with Cephalosporin use.      (Not in a hospital admission)  Results for orders placed or performed during the hospital encounter of 04/03/16 (from the past 48 hour(s))  Lipase, blood     Status: None   Collection Time: 04/03/16 12:10 PM  Result Value Ref Range   Lipase 36 11 - 51 U/L  Comprehensive metabolic panel     Status: Abnormal   Collection Time: 04/03/16 12:10 PM  Result Value Ref Range   Sodium 139 135 - 145 mmol/L   Potassium 3.9 3.5 - 5.1 mmol/L   Chloride 98 (L) 101 - 111 mmol/L   CO2 28 22 - 32 mmol/L   Glucose, Bld 141 (H) 65 - 99 mg/dL   BUN 7 6 - 20 mg/dL   Creatinine, Ser 6.91 0.61 - 1.24 mg/dL   Calcium 9.7 8.9 - 14.6 mg/dL   Total Protein 8.2 (H) 6.5 - 8.1 g/dL   Albumin 4.0 3.5 - 5.0  g/dL   AST 31 15 - 41 U/L   ALT 22 17 - 63 U/L   Alkaline Phosphatase 104 38 - 126 U/L   Total Bilirubin 0.8 0.3 - 1.2 mg/dL   GFR calc non Af Amer >60 >60 mL/min   GFR calc Af Amer >60 >60 mL/min    Comment: (NOTE) The eGFR has been calculated using the CKD EPI equation. This calculation has not been validated in all clinical situations. eGFR's persistently <60 mL/min signify possible Chronic Kidney Disease.    Anion gap 13 5 - 15  CBC     Status: Abnormal   Collection Time: 04/03/16 12:10 PM  Result Value Ref Range   WBC 6.9 4.0 - 10.5 K/uL   RBC 4.56 4.22 - 5.81 MIL/uL   Hemoglobin 11.4 (L) 13.0 - 17.0 g/dL   HCT 56.1 (L) 54.8 - 84.5 %   MCV 77.2 (L) 78.0 - 100.0 fL   MCH 25.0  (L) 26.0 - 34.0 pg   MCHC 32.4 30.0 - 36.0 g/dL   RDW 73.3 44.8 - 30.1 %   Platelets 512 (H) 150 - 400 K/uL  Urinalysis, Routine w reflex microscopic (not at [facility])     Status: None   Collection Time: 04/03/16  2:23 PM  Result Value Ref Range   Color, Urine YELLOW YELLOW   APPearance CLEAR CLEAR   Specific Gravity, Urine 1.005 1.005 - 1.030   pH 6.5 5.0 - 8.0   Glucose, UA NEGATIVE NEGATIVE mg/dL   Hgb urine dipstick NEGATIVE NEGATIVE   Bilirubin Urine NEGATIVE NEGATIVE   Ketones, ur NEGATIVE NEGATIVE mg/dL   Protein, ur NEGATIVE NEGATIVE mg/dL   Nitrite NEGATIVE NEGATIVE   Leukocytes, UA NEGATIVE NEGATIVE    Comment: MICROSCOPIC NOT DONE ON URINES WITH NEGATIVE PROTEIN, BLOOD, LEUKOCYTES, NITRITE, OR GLUCOSE <1000 mg/dL.   No results found.  Review of Systems  Constitutional: Positive for chills and malaise/fatigue. Negative for fever and diaphoresis.  Respiratory: Negative for cough, hemoptysis, sputum production, shortness of breath and wheezing.   Cardiovascular: Negative for chest pain, palpitations, orthopnea, claudication, leg swelling and PND.  Gastrointestinal: Positive for nausea, vomiting and abdominal pain. Negative for heartburn, diarrhea, constipation, blood in stool and melena.  Genitourinary: Negative for dysuria, urgency, frequency, hematuria and flank pain.  Neurological: Positive for weakness.    Blood pressure 142/89, pulse 103, temperature 98.5 F (36.9 C), temperature source Oral, resp. rate 16, SpO2 100 %. Physical Exam  Constitutional: He is oriented to person, place, and time. He appears well-developed and well-nourished. No distress.  Cardiovascular: Normal rate, regular rhythm, normal heart sounds and intact distal pulses.  Exam reveals no friction rub.   No murmur heard. Respiratory: Effort normal and breath sounds normal. No respiratory distress. He has no wheezes. He has no rales. He exhibits no tenderness.  GI: Soft. Bowel sounds are normal.  Upper  abdominal tenderness without guarding.  Light bilious drainage from ruq drain.  Incisions with steristrips in place.   Musculoskeletal: Normal range of motion. He exhibits no edema or tenderness.  Neurological: He is alert and oriented to person, place, and time.  Skin: Skin is warm and dry. No rash noted. He is not diaphoretic. No erythema.  Psychiatric: He has a normal mood and affect. His behavior is normal. Judgment and thought content normal.     Assessment/Plan Lap cholecystectomy  03/04/16--Dr. Derrell Lolling Diagnostic laparoscopy with evacuation of hematoma 03/09/16 Post ERCP pancreatitis and pseudocyst Bile leak s/p stent  Labs are essentially  normal. Exhibits upper abdominal tenderness, but no evidence of peritonitis.  Vital signs are stable.  Could have an enlarging pseudocyst that is compressing on stomach and causing vomiting and abdominal pain.  White count is normal and afebrile, but reports fouls smelling draining from RUQ drain.   With this, will proceed with a CT of abdomen and pelvis for further evaluation.  Further recommendations to follow based on the results.   Erby Pian, NP 04/03/2016, 3:37 PM

## 2016-04-03 NOTE — ED Notes (Signed)
Pt reports ongoing left sided abdominal pain that began prior to being discharged from the hospital on 4/3. Pt has had increasing pain all week. Vomited x1 today. Pt ambulatory, NAD at present. VSS.

## 2016-04-03 NOTE — ED Provider Notes (Signed)
General surgery has performed consultation for the patient. His CT scan has been reviewed. At this time, surgery has requested ED discharge instructions for the patient including MiraLAX for constipation and Percocet for pain control.  Donae Kueker PfeiffArby Barretteer, MD 04/03/16 [phone]

## 2016-04-03 NOTE — Discharge Instructions (Signed)
Abdominal Pain, Adult (post operative) You have been seen by your surgical team. At this time, they recommend using MiraLAX for constipation and also a prescription for Percocet (be aware, Percocet increases constipation and you should try to limit your use to only as much as needed for your surgical wound and healing).  SEEK MEDICAL CARE IF:  You have unexplained abdominal pain.  You have abdominal pain associated with nausea or diarrhea.  You have pain when you urinate or have a bowel movement.  You experience abdominal pain that wakes you in the night.  You have a fever. SEEK IMMEDIATE MEDICAL CARE IF:  Your pain does not go away within 2 hours.  You keep throwing up (vomiting).  Your pain is felt only in portions of the abdomen, such as the right side or the left lower portion of the abdomen.  You pass bloody or black tarry stools. MAKE SURE YOU:  Understand these instructions.  Will watch your condition.  Will get help right away if you are not doing well or get worse.   This information is not intended to replace advice given to you by your health care provider. Make sure you discuss any questions you have with your health care provider.   Document Released: 09/23/2005 Document Revised: 09/04/2015 Document Reviewed: 08/23/2013 Elsevier Interactive Patient Education 2016 [employer]. Constipation, Adult Constipation is when a person has fewer than three bowel movements a week, has difficulty having a bowel movement, or has stools that are dry, hard, or larger than normal. As people grow older, constipation is more common. A low-fiber diet, not taking in enough fluids, and taking certain medicines may make constipation worse.  CAUSES   Certain medicines, such as antidepressants, pain medicine, iron supplements, antacids, and water pills.   Certain diseases, such as diabetes, irritable bowel syndrome (IBS), thyroid disease, or depression.   Not drinking enough water.    Not eating enough fiber-rich foods.   Stress or travel.   Lack of physical activity or exercise.   Ignoring the urge to have a bowel movement.   Using laxatives too much.  SIGNS AND SYMPTOMS   Having fewer than three bowel movements a week.   Straining to have a bowel movement.   Having stools that are hard, dry, or larger than normal.   Feeling full or bloated.   Pain in the lower abdomen.   Not feeling relief after having a bowel movement.  DIAGNOSIS  Your health care provider will take a medical history and perform a physical exam. Further testing may be done for severe constipation. Some tests may include:  A barium enema X-ray to examine your rectum, colon, and, sometimes, your small intestine.   A sigmoidoscopy to examine your lower colon.   A colonoscopy to examine your entire colon. TREATMENT  Treatment will depend on the severity of your constipation and what is causing it. Some dietary treatments include drinking more fluids and eating more fiber-rich foods. Lifestyle treatments may include regular exercise. If these diet and lifestyle recommendations do not help, your health care provider may recommend taking over-the-counter laxative medicines to help you have bowel movements. Prescription medicines may be prescribed if over-the-counter medicines do not work.  HOME CARE INSTRUCTIONS   Eat foods that have a lot of fiber, such as fruits, vegetables, whole grains, and beans.  Limit foods high in fat and processed sugars, such as french fries, hamburgers, cookies, candies, and soda.   A fiber supplement may be added to  your diet if you cannot get enough fiber from foods.   Drink enough fluids to keep your urine clear or pale yellow.   Exercise regularly or as directed by your health care provider.   Go to the restroom when you have the urge to go. Do not hold it.   Only take over-the-counter or prescription medicines as directed by your  health care provider. Do not take other medicines for constipation without talking to your health care provider first.  SEEK IMMEDIATE MEDICAL CARE IF:   You have bright red blood in your stool.   Your constipation lasts for more than 4 days or gets worse.   You have abdominal or rectal pain.   You have thin, pencil-like stools.   You have unexplained weight loss. MAKE SURE YOU:   Understand these instructions.  Will watch your condition.  Will get help right away if you are not doing well or get worse.   This information is not intended to replace advice given to you by your health care provider. Make sure you discuss any questions you have with your health care provider.   Document Released: 09/11/2004 Document Revised: 01/04/2015 Document Reviewed: 09/25/2013 Elsevier Interactive Patient Education [employer]2016 Elsevier Inc.

## 2016-04-03 NOTE — ED Provider Notes (Signed)
CSN: [id number]     Arrival date & time 04/03/16  1158 History   First MD Initiated Contact with Patient 04/03/16 1401     Chief Complaint  Patient presents with  . Abdominal Pain  . Emesis      Patient is a 40 y.o. male presenting with abdominal pain and vomiting. The history is provided by the patient.  Abdominal Pain Pain location:  LUQ Pain quality: dull   Pain radiates to:  Does not radiate Associated symptoms: nausea and vomiting   Associated symptoms: no chest pain, no chills, no diarrhea, no dysuria, no fever and no shortness of breath   Emesis Associated symptoms: abdominal pain   Associated symptoms: no chills, no diarrhea and no headaches   Patient presents with abdominal pain. He is at her, kicked last few months with pancreatitis followed by cholecystectomy complicated by bile leak and fluid collection/infection. Discharge from the hospital 4 days ago. States during the end of that time he began having worse pain in his left abdomen. States since then has gotten worse and he started to have vomiting. Pain and vomiting or uncontrolled pain meds and nausea medicines. He is on Percocet and Zofran. No fevers. He has a drain in his right abdomen is continued to drain about 30 mL a day. No change in the drainage. No fevers or chills. No diarrhea. States he is vomited up green emesis.  Past Medical History  Diagnosis Date  . Hypercholesteremia   . Hypertension     01-05-16 to 02-03-16 Hospitalized for pancreatitis, B/P med discontinued during that time.  . H/O seasonal allergies     OTC med used  . Depression   . GERD (gastroesophageal reflux disease)   . Acute pancreatitis     1-8-17to 02-03-16 Hospital stay at [facility].   Past Surgical History  Procedure Laterality Date  . Wisdom tooth extraction      "local only"  . Cholecystectomy N/A 03/04/2016    Procedure: LAPAROSCOPIC CHOLECYSTECTOMY ;  Surgeon: Axel Filler, MD;  Location: [facility] ORS;  Service: General;  Laterality: N/A;  .  Laparoscopy N/A 03/09/2016    Procedure: LAPAROSCOPY DIAGNOSTIC;  Surgeon: Axel Filler, MD;  Location: [facility] OR;  Service: General;  Laterality: N/A;  . Hematoma evacuation  03/09/2016    Procedure: LAPAROSCOPIC EVACUATION OF HEMATOMA;  Surgeon: Axel Filler, MD;  Location: [facility] OR;  Service: General;;  . Ercp N/A 03/16/2016    Procedure: ENDOSCOPIC RETROGRADE CHOLANGIOPANCREATOGRAPHY (ERCP);  Surgeon: Meryl Dare, MD;  Location: [facility] ENDOSCOPY;  Service: Endoscopy;  Laterality: N/A;   Family History  Problem Relation Age of Onset  . Hypertension Father    Social History  Substance Use Topics  . Smoking status: Never Smoker   . Smokeless tobacco: None  . Alcohol Use: 0.0 oz/week    0 Standard drinks or equivalent per week     Comment: rarely    Review of Systems  Constitutional: Negative for fever, chills, activity change and appetite change.  Eyes: Negative for pain.  Respiratory: Negative for chest tightness and shortness of breath.   Cardiovascular: Negative for chest pain and leg swelling.  Gastrointestinal: Positive for nausea, vomiting and abdominal pain. Negative for diarrhea.  Genitourinary: Negative for dysuria and flank pain.  Musculoskeletal: Negative for back pain and neck stiffness.  Skin: Negative for rash.  Neurological: Negative for weakness, numbness and headaches.  Psychiatric/Behavioral: Negative for behavioral problems.      Allergies  Penicillins  Home Medications   Prior  to Admission medications   Medication Sig Start Date End Date Taking? Authorizing Provider  buPROPion (WELLBUTRIN XL) 150 MG 24 hr tablet Take 150 mg by mouth daily.    Historical Provider, MD  ibuprofen (ADVIL,MOTRIN) 200 MG tablet Take 200 mg by mouth every 6 (six) hours as needed for mild pain.    Historical Provider, MD  ondansetron (ZOFRAN-ODT) 4 MG disintegrating tablet Take 1-2 tablets (4-8 mg total) by mouth every 8 (eight) hours as needed for nausea or vomiting. 02/10/16    Ejiroghene Wendall StadeE Emokpae, MD  oxyCODONE (OXY IR/ROXICODONE) 5 MG immediate release tablet Take 1-2 tablets (5-10 mg total) by mouth every 8 (eight) hours as needed for severe pain. 02/24/16   Courtney ParisEden W Jones, MD  oxyCODONE-acetaminophen (PERCOCET/ROXICET) 5-325 MG tablet Take 1-2 tablets by mouth every 4 (four) hours as needed for severe pain. 03/30/16   Axel FillerArmando Ramirez, MD  oxyCODONE-acetaminophen (ROXICET) 5-325 MG tablet Take 1-2 tablets by mouth every 4 (four) hours as needed. 03/04/16   Axel FillerArmando Ramirez, MD  pantoprazole (PROTONIX) 40 MG tablet Take 1 tablet (40 mg total) by mouth daily. 02/03/16   Selina CooleyKyle Flores, MD   BP 152/103 mmHg  Pulse 91  Temp(Src) 98.5 F (36.9 C) (Oral)  Resp 18  SpO2 100% Physical Exam  Constitutional: He appears well-developed and well-nourished.  HENT:  Head: Normocephalic and atraumatic.  Eyes: EOM are normal. Pupils are equal, round, and reactive to light.  Neck: Normal range of motion. Neck supple.  Cardiovascular: Normal rate, regular rhythm and normal heart sounds.   No murmur heard. Pulmonary/Chest: Effort normal and breath sounds normal.  Abdominal: Soft. He exhibits no distension and no mass. There is tenderness. There is no rebound and no guarding.  Right upper quadrant drain with some serosanguineous fluid drainage. No real tenderness to site left upper quadrant epigastric tenderness. No rebound or guarding.  Neurological: He is alert.  Skin: Skin is warm.  Nursing note and vitals reviewed.   ED Course  Procedures (including critical care time) Labs Review Labs Reviewed  COMPREHENSIVE METABOLIC PANEL - Abnormal; Notable for the following:    Chloride 98 (*)    Glucose, Bld 141 (*)    Total Protein 8.2 (*)    All other components within normal limits  CBC - Abnormal; Notable for the following:    Hemoglobin 11.4 (*)    HCT 35.2 (*)    MCV 77.2 (*)    MCH 25.0 (*)    Platelets 512 (*)    All other components within normal limits  LIPASE, BLOOD   URINALYSIS, ROUTINE W REFLEX MICROSCOPIC (NOT AT [facility])    Imaging Review No results found. I have personally reviewed and evaluated these images and lab results as part of my medical decision-making.   EKG Interpretation None      MDM   Final diagnoses:  Left upper quadrant pain    Patient with upper abdominal pain. Complicated biliary issues recently. Has drain in right upper quadrant. Labs overall reassuring. Tenderness is worsened since recent admission. Will have patient seen by general surgery.    Benjiman CoreNathan Naftuli Dalsanto, MD 04/03/16 1452

## 2016-04-03 NOTE — ED Notes (Signed)
Pt requests more information from general surgery. So general surgery will be re paged.

## 2016-04-14 ENCOUNTER — Other Ambulatory Visit (HOSPITAL_COMMUNITY): Payer: Self-pay | Admitting: Diagnostic Radiology

## 2016-04-14 ENCOUNTER — Other Ambulatory Visit: Payer: Self-pay | Admitting: General Surgery

## 2016-04-14 ENCOUNTER — Ambulatory Visit
Admission: RE | Admit: 2016-04-14 | Discharge: 2016-04-14 | Disposition: A | Discharge status: Home / Self Care | Payer: BLUE CROSS/BLUE SHIELD | Source: Ambulatory Visit | Attending: General Surgery | Admitting: General Surgery

## 2016-04-14 ENCOUNTER — Ambulatory Visit: Admission: RE | Admit: 2016-04-14 | Payer: BLUE CROSS/BLUE SHIELD | Source: Ambulatory Visit

## 2016-04-14 DIAGNOSIS — IMO0001 Reserved for inherently not codable concepts without codable children: Secondary | ICD-10-CM

## 2016-04-14 DIAGNOSIS — K863 Pseudocyst of pancreas: Secondary | ICD-10-CM

## 2016-04-14 DIAGNOSIS — T814XXD Infection following a procedure, subsequent encounter: Principal | ICD-10-CM

## 2016-04-14 DIAGNOSIS — K651 Peritoneal abscess: Principal | ICD-10-CM

## 2016-04-14 DIAGNOSIS — T814XXA Infection following a procedure, initial encounter: Principal | ICD-10-CM

## 2016-04-14 MED ORDER — IOPAMIDOL (ISOVUE-300) INJECTION 61%
100.0000 mL | Freq: Once | INTRAVENOUS | Status: AC | PRN
  Administered 2016-04-14: 100 mL via INTRAVENOUS

## 2016-04-14 NOTE — Progress Notes (Signed)
Patient ID: Drew Prince, male   DOB: [birth year], 40 y.o.   MRN: [id number]   Referring Physician(s): Ramirez,Armando  Chief Complaint: The patient is seen in follow up today s/p gallbladder fossa fluid collection drainage on 03/26/16. History of present illness: Drew Prince is a 40 year old male status post cholecystectomy secondary to gallstone pancreatitis on 03/04/16 by Dr. Derrell Lolling. The procedure was complicated by bile leak and post ERCP pancreatitis with pseudocyst formation. He also underwent evacuation of an intra-abdominal hematoma on 03/09/16. A distal common bile duct stent was placed by Dr. Russella Dar on 03/16/16. The patient presents today for follow-up CT of the abdomen to assess adequacy of drainage. He currently denies fevers, headaches, chest pain, dyspnea, cough, vomiting or abnormal bleeding. He does have some intermittent nausea as well as right and left upper quadrant abdominal discomfort . He is currently not on antibiotic therapy. Fluid culture from previous gallbladder fossa region grew a few Candida albicans. He is currently flushing the drain with 5 mL of sterile normal saline once daily. Output of drain is averaging approximately 20 mL per day of light yellow colored fluid.    Past Medical History  Diagnosis Date  . Hypercholesteremia   . Hypertension     01-05-16 to 02-03-16 Hospitalized for pancreatitis, B/P med discontinued during that time.  . H/O seasonal allergies     OTC med used  . Depression   . GERD (gastroesophageal reflux disease)   . Acute pancreatitis     1-8-17to 02-03-16 Hospital stay at [facility].    Past Surgical History  Procedure Laterality Date  . Wisdom tooth extraction      "local only"  . Cholecystectomy N/A 03/04/2016    Procedure: LAPAROSCOPIC CHOLECYSTECTOMY ;  Surgeon: Axel Filler, MD;  Location: [facility] ORS;  Service: General;  Laterality: N/A;  . Laparoscopy N/A 03/09/2016    Procedure: LAPAROSCOPY DIAGNOSTIC;  Surgeon: Axel Filler, MD;   Location[facility] OR;  Service: General;  Laterality: N/A;  . Hematoma evacuation  03/09/2016    Procedure: LAPAROSCOPIC EVACUATION OF HEMATOMA;  Surgeon: Axel Filler, MD;  Location: [facility] OR;  Service: General;;  . Ercp N/A 03/16/2016    Procedure: ENDOSCOPIC RETROGRADE CHOLANGIOPANCREATOGRAPHY (ERCP);  Surgeon: Meryl Dare, MD;  Location: [facility] ENDOSCOPY;  Service: Endoscopy;  Laterality: N/A;    Allergies: Penicillins  Medications: Prior to Admission medications   Medication Sig Start Date End Date Taking? Authorizing Provider  buPROPion (WELLBUTRIN XL) 150 MG 24 hr tablet Take 150 mg by mouth daily.    Historical Provider, MD  ibuprofen (ADVIL,MOTRIN) 200 MG tablet Take 200 mg by mouth every 6 (six) hours as needed for mild pain.    Historical Provider, MD  ondansetron (ZOFRAN-ODT) 4 MG disintegrating tablet Take 1-2 tablets (4-8 mg total) by mouth every 8 (eight) hours as needed for nausea or vomiting. 02/10/16   Ejiroghene Wendall Stade, MD  oxyCODONE (OXY IR/ROXICODONE) 5 MG immediate release tablet Take 1-2 tablets (5-10 mg total) by mouth every 8 (eight) hours as needed for severe pain. 02/24/16   Courtney Paris, MD  oxyCODONE-acetaminophen (PERCOCET/ROXICET) 5-325 MG tablet Take 1-2 tablets by mouth every 4 (four) hours as needed for severe pain. 03/30/16   Axel Filler, MD  oxyCODONE-acetaminophen (PERCOCET/ROXICET) 5-325 MG tablet Take 1-2 tablets by mouth every 4 (four) hours as needed for severe pain. 04/03/16   Arby Barrette, MD  oxyCODONE-acetaminophen (ROXICET) 5-325 MG tablet Take 1-2 tablets by mouth every 4 (four) hours as needed. 03/04/16   Axel Filler,  MD  pantoprazole (PROTONIX) 40 MG tablet Take 1 tablet (40 mg total) by mouth daily. 02/03/16   Selina CooleyKyle Flores, MD  polyethylene glycol [facility](MIRALAX / Ethelene HalGLYCOLAX) packet Take 17 g by mouth daily. 04/03/16   Arby BarretteMarcy Pfeiffer, MD     Family History  Problem Relation Age of Onset  . Hypertension Father     Social History   Social History  .  Marital Status: Married    Spouse Name: N/A  . Number of Children: N/A  . Years of Education: N/A   Social History Main Topics  . Smoking status: Never Smoker   . Smokeless tobacco: Not on file  . Alcohol Use: 0.0 oz/week    0 Standard drinks or equivalent per week     Comment: rarely  . Drug Use: No  . Sexual Activity: Not on file   Other Topics Concern  . Not on file   Social History Narrative     Vital Signs:Blood pressure 160/96, heart rate 96, temp 98.2, oxygen saturation 100% room air   Physical Exam patient awake, alert. Chest clear to auscultation bilaterally. Heart with regular rate and rhythm. Abdomen soft, positive bowel sounds, clean, intact right upper quadrant drain with few cc of light yellow fluid in JP bulb. Mild tenderness to palpation in right upper and left upper abd quadrants; lower extremities with no edema  Imaging: No results found.  Labs:  CBC:  Recent Labs  03/19/16 0430 03/21/16 1009 03/22/16 0618 04/03/16 1210  WBC 15.2* 10.8* 8.6 6.9  HGB 11.1* 11.4* 10.9* 11.4*  HCT 33.9* 34.4* 34.0* 35.2*  PLT 523* 483* 445* 512*    COAGS:  Recent Labs  01/09/16 1054 03/26/16 0456  INR 1.11 1.18  APTT 30  --     BMP:  Recent Labs  03/21/16 1009 03/22/16 0618 03/22/16 2320 04/03/16 1210  NA 143 141 138 139  K 3.3* 3.4* 3.0* 3.9  CL 103 103 100* 98*  CO2 29 27 26 28   GLUCOSE 114* 106* 101* 141*  BUN 5* 5* <5* 7  CALCIUM 9.1 9.0 9.0 9.7  CREATININE 1.30* 1.24 1.06 1.05  GFRNONAA >60 >60 >60 >60  GFRAA >60 >60 >60 >60    LIVER FUNCTION TESTS:  Recent Labs  03/20/16 1058 03/21/16 1009 03/22/16 0618 04/03/16 1210  BILITOT 0.7 1.0 0.8 0.8  AST 23 18 21 31   ALT 21 17 15* 22  ALKPHOS 199* 159* 134* 104  PROT 7.2 7.0 6.7 8.2*  ALBUMIN 3.1* 3.0* 3.0* 4.0    Assessment: Patient status post gallbladder fossa fluid collection drainage on 03/26/16; status post cholecystectomy on 3/8 with subsequent bile leak/common bile duct  stent placement, post ERCP pancreatitis and pseudocyst formation/evacuation of intra-abdominal hematoma. Patient currently stable with approximately 20 mL per day output of light yellow fluid from gallbladder fossa drain. Follow-up CT today reveals no significant fluid in gallbladder fossa region, stable pancreatic pseudocysts. Due to persistent drainage from gallbladder fossa region will plan to discontinue drain irrigation and reevaluate patient next week for possible drain removal if output has trended down. He is scheduled for follow-up with Dr. Derrell Lollingamirez this morning as well. Drew Prince was told to contact our service in the interim with any additional questions.   Signed: D. Jeananne RamaKevin Izsak Meir 04/14/2016, 10:19 AM   Please refer to Dr. Antonietta JewelYamagata's attestation of this note for management and plan.

## 2016-04-21 ENCOUNTER — Ambulatory Visit (INDEPENDENT_AMBULATORY_CARE_PROVIDER_SITE_OTHER): Payer: BLUE CROSS/BLUE SHIELD | Admitting: Internal Medicine

## 2016-04-21 ENCOUNTER — Ambulatory Visit
Admission: RE | Admit: 2016-04-21 | Discharge: 2016-04-21 | Disposition: A | Discharge status: Home / Self Care | Payer: BLUE CROSS/BLUE SHIELD | Source: Ambulatory Visit | Attending: Diagnostic Radiology | Admitting: Diagnostic Radiology

## 2016-04-21 ENCOUNTER — Encounter: Payer: Self-pay | Admitting: Internal Medicine

## 2016-04-21 VITALS — BP 136/98 | HR 80 | Ht 64.0 in | Wt 154.8 lb

## 2016-04-21 DIAGNOSIS — K863 Pseudocyst of pancreas: Secondary | ICD-10-CM

## 2016-04-21 DIAGNOSIS — K651 Peritoneal abscess: Principal | ICD-10-CM

## 2016-04-21 DIAGNOSIS — K839 Disease of biliary tract, unspecified: Secondary | ICD-10-CM | POA: Diagnosis not present

## 2016-04-21 DIAGNOSIS — T814XXD Infection following a procedure, subsequent encounter: Principal | ICD-10-CM

## 2016-04-21 DIAGNOSIS — IMO0001 Reserved for inherently not codable concepts without codable children: Secondary | ICD-10-CM

## 2016-04-21 NOTE — Progress Notes (Signed)
   Subjective:    Patient ID: Drew Prince, male    DOB: 02/08/1976, 40 y.o.   MRN: [id number] Chief complaint: Follow-up of bile leak HPI The patient is a 40 year old man with a history of severe acute pancreatitis. He eventually had his all bladder removed and suffered a bile leak that was difficult to diagnose, Dr. Russella DarStark placed a stent into the bile duct on 03/16/2016, with clinical resolution of the bile leak. The patient is out of the hospital obviously and he is improved but he continues to have problems with upper quadrant pain. He is being followed by intervention radiology and Dr. Derrell Lollingamirez. He may get his gallbladder fossa drain removed later this week. He says it's putting out about 20 mL of green tinged fluid a day, there is slight bile tinged fluid in it. He has remained out of work. MiraLAX is treating constipation related to narcotic pain medication. Medications, allergies, past medical history, past surgical history, family history and social history are reviewed and updated in the EMR.  Review of Systems As above    Objective:   Physical Exam BP 136/98 mmHg  Pulse 80  Ht 5\' 4"  (1.626 m)  Wt 154 lb 12.8 oz (70.217 kg)  BMI 26.56 kg/m2 Eyes anicteric Abdomen soft bowel sounds present. There is a right upper quadrant drain with a suction bulb attached with just a 5-10 mL of green tinged fluid. Abdomen is soft he has some mild to moderate tenderness in left upper quadrant I can detect no masses. There are well-healed surgical scars.  Last CT April 14 2016 . Resolution of gallbladder fossa of fluid collection by CT after percutaneous drainage. 2. No new fluid collections identified. Stable appearance of endoscopic common bile duct stent which is normally positioned. No evidence of biliary ductal dilatation. 3. Stable appearance of lobulated/multiloculated pseudocyst involving the tail of the pancreas. No associated hemorrhage or abnormal gas within the pseudocyst. 9 x 4 cm    Assessment & Plan:   1. Bile leak   2. Pancreatic pseudocyst    We'll await CT scan, anticipate removing the stent in 1-2 months. Would wait until his biliary drain is removed most likely. I've explained to the patient and lookout for jaundice, fever right upper quadrant pain that could indicate stent occlusion and to notify us promptly or go to the emergency room.  WU:[id number]:Armando Lianne Morisamirez M.D.

## 2016-04-21 NOTE — Assessment & Plan Note (Signed)
Being followed by Dr. Derrell Lollingamirez of surgery. If it fails to resolve it sounds like a cyst gastrostomy may be the next step.

## 2016-04-21 NOTE — Patient Instructions (Signed)
   Please let us know if you have a fever, rlght upper quadrant pain, jaundice .    We will follow up with you after your CT scan with plans.    I appreciate the opportunity to care for you.

## 2016-04-21 NOTE — Progress Notes (Signed)
Referring Physician(s): Dr. Axel FillerArmando Ramirez  Supervising Physician: Richarda OverlieHenn, Adam  Patient Status: Out-pt  Chief Complaint: Gallbladder fossa abscess  Subjective: 40 yo male s/p perc drain for gb fossa abscess on 3/30. He also has pancreatic pseudocyst from post ERCP pancreatitis. He is doing better overall. His last CT 4/18 showed resolution of the gb fossa abscess. He has continued to have about 20mL of serous fluid from his drain and is here today for consideration of drain removal. However, he notes he is scheduled for another CT this Friday. No fevers, pains, N/V, appetite ok.   Allergies: Penicillins  Medications: Prior to Admission medications   Medication Sig Start Date End Date Taking? Authorizing Provider  buPROPion (WELLBUTRIN XL) 150 MG 24 hr tablet Take 150 mg by mouth daily.    Historical Provider, MD  ibuprofen (ADVIL,MOTRIN) 200 MG tablet Take 200 mg by mouth every 6 (six) hours as needed for mild pain.    Historical Provider, MD  ondansetron (ZOFRAN-ODT) 4 MG disintegrating tablet Take 1-2 tablets (4-8 mg total) by mouth every 8 (eight) hours as needed for nausea or vomiting. 02/10/16   Ejiroghene Wendall StadeE Emokpae, MD  oxyCODONE (OXY IR/ROXICODONE) 5 MG immediate release tablet Take 1-2 tablets (5-10 mg total) by mouth every 8 (eight) hours as needed for severe pain. 02/24/16   Courtney ParisEden W Jones, MD  oxyCODONE-acetaminophen (PERCOCET/ROXICET) 5-325 MG tablet Take 1-2 tablets by mouth every 4 (four) hours as needed for severe pain. 03/30/16   Axel FillerArmando Ramirez, MD  oxyCODONE-acetaminophen (PERCOCET/ROXICET) 5-325 MG tablet Take 1-2 tablets by mouth every 4 (four) hours as needed for severe pain. 04/03/16   Arby BarretteMarcy Pfeiffer, MD  oxyCODONE-acetaminophen (ROXICET) 5-325 MG tablet Take 1-2 tablets by mouth every 4 (four) hours as needed. 03/04/16   Axel FillerArmando Ramirez, MD  pantoprazole (PROTONIX) 40 MG tablet Take 1 tablet (40 mg total) by mouth daily. 02/03/16   Selina CooleyKyle Flores, MD  polyethylene glycol  [facility](MIRALAX / Ethelene HalGLYCOLAX) packet Take 17 g by mouth daily. 04/03/16   Arby BarretteMarcy Pfeiffer, MD     Vital Signs: BP 142/99 mmHg  Pulse 96  Temp(Src) 98.8 F (37.1 C) (Oral)  Resp 14  SpO2 100%  Physical Exam (R)UQ drain intact, site clean. Output thin clear serous, no purulence.   Imaging: No results found.  Labs:  CBC:  Recent Labs  03/19/16 0430 03/21/16 1009 03/22/16 0618 04/03/16 1210  WBC 15.2* 10.8* 8.6 6.9  HGB 11.1* 11.4* 10.9* 11.4*  HCT 33.9* 34.4* 34.0* 35.2*  PLT 523* 483* 445* 512*    COAGS:  Recent Labs  01/09/16 1054 03/26/16 0456  INR 1.11 1.18  APTT 30  --     BMP:  Recent Labs  03/21/16 1009 03/22/16 0618 03/22/16 2320 04/03/16 1210  NA 143 141 138 139  K 3.3* 3.4* 3.0* 3.9  CL 103 103 100* 98*  CO2 29 27 26 28   GLUCOSE 114* 106* 101* 141*  BUN 5* 5* <5* 7  CALCIUM 9.1 9.0 9.0 9.7  CREATININE 1.30* 1.24 1.06 1.05  GFRNONAA >60 >60 >60 >60  GFRAA >60 >60 >60 >60    LIVER FUNCTION TESTS:  Recent Labs  03/20/16 1058 03/21/16 1009 03/22/16 0618 04/03/16 1210  BILITOT 0.7 1.0 0.8 0.8  AST 23 18 21 31   ALT 21 17 15* 22  ALKPHOS 199* 159* 134* 104  PROT 7.2 7.0 6.7 8.2*  ALBUMIN 3.1* 3.0* 3.0* 4.0    Assessment and Plan: GB fossa abscess, resolved by CT 4/18. Minimal  innocent appearing serous fluid from drain. Will defer removal until after CT Friday if it shows continued resolution of abscess.  Electronically Signed: Brayton El 04/21/2016, 9:48 AM   I spent a total of 15 Minutes at the the patient's bedside AND on the patient's hospital floor or unit, greater than 50% of which was counseling/coordinating care for gallbladder fossa abscess drain.

## 2016-04-21 NOTE — Assessment & Plan Note (Signed)
Tx w/ stent Await drain removal and repeat CT Then remove stent

## 2016-04-23 ENCOUNTER — Other Ambulatory Visit: Payer: Self-pay | Admitting: General Surgery

## 2016-04-23 DIAGNOSIS — L0291 Cutaneous abscess, unspecified: Secondary | ICD-10-CM

## 2016-04-24 ENCOUNTER — Ambulatory Visit
Admission: RE | Admit: 2016-04-24 | Discharge: 2016-04-24 | Disposition: A | Discharge status: Home / Self Care | Payer: BLUE CROSS/BLUE SHIELD | Source: Ambulatory Visit | Attending: General Surgery | Admitting: General Surgery

## 2016-04-24 DIAGNOSIS — L0291 Cutaneous abscess, unspecified: Secondary | ICD-10-CM

## 2016-04-24 MED ORDER — IOPAMIDOL (ISOVUE-300) INJECTION 61%
100.0000 mL | Freq: Once | INTRAVENOUS | Status: AC | PRN
  Administered 2016-04-24: 100 mL via INTRAVENOUS

## 2016-04-24 NOTE — Progress Notes (Signed)
Pt returned to clinic after repeat CT of the abdomen today. CT reviewed with Dr. Lowella DandyHenn Gallbladder fossa abscess remains resolved. Drain removed at bedside, no complications.  Pt to follow up with his surgeon as scheduled.  Brayton ElKevin Filomena Pokorney PA-C Interventional Radiology 04/24/2016 10:57 AM

## 2016-04-27 NOTE — Progress Notes (Signed)
Quick Note:  CT reviewed - will need stent removal May or June Would like to know if he has a plan from Dr. Derrell Lollingamirez re: pseudocyst surgery first ______

## 2016-04-30 ENCOUNTER — Other Ambulatory Visit: Payer: Self-pay | Admitting: Internal Medicine

## 2016-04-30 ENCOUNTER — Other Ambulatory Visit: Payer: Self-pay | Admitting: *Deleted

## 2016-05-01 ENCOUNTER — Other Ambulatory Visit: Payer: Self-pay | Admitting: *Deleted

## 2016-05-01 MED ORDER — PANTOPRAZOLE SODIUM 40 MG PO TBEC: 40.0000 mg | DELAYED_RELEASE_TABLET | Freq: Every day | ORAL | Status: DC

## 2016-05-01 NOTE — Telephone Encounter (Signed)
Needs Pcp appt next month

## 2016-05-04 ENCOUNTER — Other Ambulatory Visit: Payer: Self-pay | Admitting: *Deleted

## 2016-05-13 ENCOUNTER — Telehealth: Payer: Self-pay | Admitting: Internal Medicine

## 2016-05-13 ENCOUNTER — Other Ambulatory Visit: Payer: Self-pay

## 2016-05-13 DIAGNOSIS — T859XXS Unspecified complication of internal prosthetic device, implant and graft, sequela: Secondary | ICD-10-CM

## 2016-05-13 NOTE — Progress Notes (Signed)
Quick Note:  Having pancreatic pseudocyst surgery 6/14 He should have bile duct stent removed w/ ERCP my hospital week please Needs cipro 400 mg IV before and indomethacin given after for pancreatitis prevention ______

## 2016-05-13 NOTE — Telephone Encounter (Signed)
See result notes of CT for additional details

## 2016-05-14 ENCOUNTER — Other Ambulatory Visit: Payer: Self-pay | Admitting: Internal Medicine

## 2016-05-14 ENCOUNTER — Telehealth: Payer: Self-pay | Admitting: Internal Medicine

## 2016-05-14 DIAGNOSIS — Z4689 Encounter for fitting and adjustment of other specified devices: Secondary | ICD-10-CM

## 2016-05-14 NOTE — Telephone Encounter (Signed)
I have patient r/s[facility]ed to [facility].  Patient aware, he needed it r/s'ed for transportation reasons.

## 2016-05-14 NOTE — Telephone Encounter (Signed)
Patient returned phone call. Best # [phone]

## 2016-05-14 NOTE — Telephone Encounter (Signed)
Left message for patient to call us back [facility]  I spoke with Dr Leone PayorGessner and his ERCP needs to be done before his other surgery on 06/10/16 with Dr Derrell Lollingamirez.

## 2016-05-19 NOTE — Progress Notes (Signed)
CHEST XRAY 1 VIEW 03-08-16 EPIC EKG 03-23-16 EPIC

## 2016-05-19 NOTE — Patient Instructions (Addendum)
Drew Prince  05/19/2016   Your procedure is scheduled on:   [facility]  [facility]  [facility] to 3rd floor to  Short Stay Center at 630 AM.  Call this number if you have problems the morning of surgery [phone]   Remember: ONLY 1 PERSON MAY GO WITH YOU TO SHORT STAY TO GET  READY MORNING OF YOUR SURGERY.  Do not eat food or drink liquids :After Midnight.     Take these medicines the morning of surgery with A SIP OF WATER:, OXYCODONE IF NEEDED                              You may not have any metal on your body including hair pins and              piercings  Do not wear jewelry, make-up, lotions, powders or perfumes, deodorant             Do not wear nail polish.  Do not shave  48 hours prior to surgery.              Men may shave face and neck.   Do not bring valuables to the hospital. [facility] IS NOT             RESPONSIBLE   FOR VALUABLES.  Contacts, dentures or bridgework may not be worn into surgery.  Leave suitcase in the car. After surgery it may be brought to your room.                  Please read over the following fact sheets you were given: _____________________________________________________________________             [facility] - Preparing for [facility] Before surgery, you can play an important role.  Because skin is not sterile, your skin needs to be as free of germs as possible.  You can reduce the number of germs on your skin by washing with CHG (chlorahexidine gluconate) soap before surgery.  CHG is an antiseptic cleaner which kills germs and bonds with the skin to continue killing germs even after washing. Please DO NOT use if you have an allergy to CHG or antibacterial soaps.  If your skin becomes reddened/irritated stop using the CHG and inform your nurse when you arrive at Short Stay. Do not shave (including legs and underarms) for at least 48 hours prior to the first CHG shower.  You may shave your  face/neck. Please follow these instructions carefully:  1.  Shower with CHG Soap the night before surgery and the  morning of Surgery.  2.  If you choose to wash your hair, wash your hair first as usual with your  normal  shampoo.  3.  After you shampoo, rinse your hair and body thoroughly to remove the  shampoo.                           4.  Use CHG as you would any other liquid soap.  You can apply chg directly  to the skin and wash                       Gently with a scrungie or clean washcloth.  5.  Apply the CHG Soap to  your body ONLY FROM THE NECK DOWN.   Do not use on face/ open                           Wound or open sores. Avoid contact with eyes, ears mouth and genitals (private parts).                       Wash face,  Genitals (private parts) with your normal soap.             6.  Wash thoroughly, paying special attention to the area where your surgery  will be performed.  7.  Thoroughly rinse your body with warm water from the neck down.  8.  DO NOT shower/wash with your normal soap after using and rinsing off  the CHG Soap.                9.  Pat yourself dry with a clean towel.            10.  Wear clean pajamas.            11.  Place clean sheets on your bed the night of your first shower and do not  sleep with pets. Day of Surgery : Do not apply any lotions/deodorants the morning of surgery.  Please wear clean clothes to the hospital/surgery center.  FAILURE TO FOLLOW THESE INSTRUCTIONS MAY RESULT IN THE CANCELLATION OF YOUR SURGERY PATIENT SIGNATURE_________________________________  NURSE SIGNATURE__________________________________  ________________________________________________________________________

## 2016-05-20 ENCOUNTER — Encounter (HOSPITAL_COMMUNITY)
Admission: RE | Admit: 2016-05-20 | Discharge: 2016-05-20 | Disposition: A | Discharge status: Home / Self Care | Payer: BLUE CROSS/BLUE SHIELD | Source: Ambulatory Visit | Attending: General Surgery | Admitting: General Surgery

## 2016-05-20 ENCOUNTER — Encounter (HOSPITAL_COMMUNITY): Payer: Self-pay

## 2016-05-20 DIAGNOSIS — K863 Pseudocyst of pancreas: Secondary | ICD-10-CM | POA: Insufficient documentation

## 2016-05-20 DIAGNOSIS — Z01812 Encounter for preprocedural laboratory examination: Secondary | ICD-10-CM | POA: Insufficient documentation

## 2016-05-20 HISTORY — DX: Other specified postprocedural states: Z98.890

## 2016-05-20 HISTORY — DX: Nausea with vomiting, unspecified: R11.2

## 2016-05-20 LAB — COMPREHENSIVE METABOLIC PANEL
ALBUMIN: 4.7 g/dL (ref 3.5–5.0)
ALK PHOS: 88 U/L (ref 38–126)
ALT: 63 U/L (ref 17–63)
AST: 30 U/L (ref 15–41)
Anion gap: 9 (ref 5–15)
BILIRUBIN TOTAL: 1.2 mg/dL (ref 0.3–1.2)
BUN: 11 mg/dL (ref 6–20)
CALCIUM: 9.7 mg/dL (ref 8.9–10.3)
CO2: 30 mmol/L (ref 22–32)
Chloride: 101 mmol/L (ref 101–111)
Creatinine, Ser: 0.86 mg/dL (ref 0.61–1.24)
GFR calc Af Amer: >60 mL/min (ref >60)
GFR calc non Af Amer: >60 mL/min (ref >60)
GLUCOSE: 107 mg/dL — AB (ref 65–99)
Potassium: 4.2 mmol/L (ref 3.5–5.1)
SODIUM: 140 mmol/L (ref 135–145)
TOTAL PROTEIN: 7.9 g/dL (ref 6.5–8.1)

## 2016-05-20 LAB — CBC
HEMATOCRIT: 42.9 % (ref 39.0–52.0)
HEMOGLOBIN: 14.2 g/dL (ref 13.0–17.0)
MCH: 25.9 pg — ABNORMAL LOW (ref 26.0–34.0)
MCHC: 33.1 g/dL (ref 30.0–36.0)
MCV: 78.3 fL (ref 78.0–100.0)
Platelets: 336 10*3/uL (ref 150–400)
RBC: 5.48 MIL/uL (ref 4.22–5.81)
RDW: 14.1 % (ref 11.5–15.5)
WBC: 7.4 10*3/uL (ref 4.0–10.5)

## 2016-05-20 MED ORDER — HYDROMORPHONE HCL 1 MG/ML IJ SOLN: 0.2500 mg | INTRAMUSCULAR | Status: DC | PRN

## 2016-05-28 ENCOUNTER — Telehealth: Payer: Self-pay | Admitting: Internal Medicine

## 2016-05-28 ENCOUNTER — Ambulatory Visit: Payer: Self-pay | Admitting: General Surgery

## 2016-05-28 NOTE — H&P (Signed)
History of Present Illness (Zadia Uhde MD; 04/27/2016 10:45 AM) The patient is a 39 year old male who presents with acute pancreatitis. Patient is a 39-year-old male with a documented history of for continued pseudocysts. Patient recently underwent CT scan and had his right upper quadrant drain removed. The right upper quadrant fluid collection at resolved.  Patient continues with left upper quadrant abdominal pain, nausea, decreased appetite, early fullness. She is currently taking pain medication help with the pain. He states this is affecting the pain a minimal amount. CT scan did show a stable pseudocyst of the pancreas.   Allergies (Ashley Beck, CMA; 04/27/2016 10:24 AM) PenicillAMINE *Assorted Classes**  Medication History (Ashley Beck, CMA; 04/27/2016 10:24 AM) BuPROPion HCl ER (XL) (150MG Tablet ER 24HR, Oral) Active. Pantoprazole Sodium (40MG Tablet DR, Oral) Active. Ondansetron (4MG Tablet Disperse, Oral) Active. OxyCODONE HCl (5MG Tablet, Oral) Active. Medications Reconciled  Vitals (Ashley Beck CMA; 04/27/2016 10:24 AM) 04/27/2016 10:24 AM Weight: 156 lb Height: 64in Body Surface Area: 1.76 m Body Mass Index: 26.78 kg/m  Temp.: 97.6F  Pulse: 100 (Regular)  BP: 128/82 (Sitting, Left Arm, Standard)       Physical Exam ([place] Souter MD; 04/27/2016 10:45 AM) General Mental Status-Alert. General Appearance-Consistent with stated age. Hydration-Well hydrated. Voice-Normal.  Chest and Lung Exam Chest and lung exam reveals -quiet, even and easy respiratory effort with no use of accessory muscles and on auscultation, normal breath sounds, no adventitious sounds and normal vocal resonance. Inspection Chest Wall - Normal. Back - normal.  Cardiovascular Cardiovascular examination reveals -normal heart sounds, regular rate and rhythm with no murmurs and normal pedal pulses bilaterally.  Abdomen Note: Left upper quadrant abdominal  pain.     Assessment & Plan (Lason Eveland MD; 04/27/2016 10:49 AM) PSEUDOCYST OF PANCREAS (K86.3) Impression: 39-year-old male with a pancreatic pseudocyst. Pseudocyst probably been stable for the last 2 months. Patient continues to be symptomatic from the pseudocyst. Patient's artery was removed based on the resolution of his right upper quadrant fluid collection  1. We will schedule the patient for laparoscopic cyst gastrostomy. 2. I discussed with him the risks benefits the procedure to include but not limited to: Infection, bleeding, damage to structures, possible recurrence. Patient was understanding and wished to proceed.  

## 2016-05-28 NOTE — Telephone Encounter (Signed)
Patient advised [facility]:00 on 06/05/16 and be NPO after midnight.  He is advised to bring a driver

## 2016-06-01 ENCOUNTER — Ambulatory Visit (HOSPITAL_COMMUNITY): Admit: 2016-06-01 | Payer: Self-pay | Admitting: Internal Medicine

## 2016-06-01 ENCOUNTER — Encounter (HOSPITAL_COMMUNITY): Payer: Self-pay

## 2016-06-01 SURGERY — ERCP, WITH INTERVENTION IF INDICATED
Anesthesia: General

## 2016-06-02 ENCOUNTER — Telehealth: Payer: Self-pay | Admitting: *Deleted

## 2016-06-02 ENCOUNTER — Other Ambulatory Visit: Payer: Self-pay | Admitting: Internal Medicine

## 2016-06-02 MED ORDER — PANTOPRAZOLE SODIUM 40 MG PO TBEC: 40.0000 mg | DELAYED_RELEASE_TABLET | Freq: Every evening | ORAL | Status: DC

## 2016-06-02 NOTE — Telephone Encounter (Signed)
Pt needs appt

## 2016-06-04 ENCOUNTER — Encounter (HOSPITAL_COMMUNITY): Payer: Self-pay | Admitting: *Deleted

## 2016-06-04 NOTE — Progress Notes (Signed)
Spoke with pt for pre-op call. He denies cardiac history, chest pain or sob. States he was recently taken off of his HTN medicines.    EKG - 03/19/16 - in EPIC

## 2016-06-04 NOTE — Telephone Encounter (Signed)
I called patient today, but no answer.  Left voice msg making him aware of his appt on 07-01-16 @ 3:15 pm with Dr. Terrilee CroakV. Patel.  Going to send him [employer] msg also.

## 2016-06-05 ENCOUNTER — Encounter (HOSPITAL_COMMUNITY): Payer: Self-pay | Admitting: *Deleted

## 2016-06-05 ENCOUNTER — Ambulatory Visit (HOSPITAL_COMMUNITY): Payer: BLUE CROSS/BLUE SHIELD

## 2016-06-05 ENCOUNTER — Ambulatory Visit (HOSPITAL_COMMUNITY)
Admission: RE | Admit: 2016-06-05 | Discharge: 2016-06-05 | Disposition: A | Discharge status: Home / Self Care | Payer: BLUE CROSS/BLUE SHIELD | Source: Ambulatory Visit | Attending: Internal Medicine | Admitting: Internal Medicine

## 2016-06-05 ENCOUNTER — Encounter (HOSPITAL_COMMUNITY)
Admission: RE | Disposition: A | Discharge status: Home / Self Care | Payer: Self-pay | Source: Ambulatory Visit | Attending: Internal Medicine

## 2016-06-05 ENCOUNTER — Ambulatory Visit (HOSPITAL_COMMUNITY): Payer: BLUE CROSS/BLUE SHIELD | Admitting: Anesthesiology

## 2016-06-05 DIAGNOSIS — Z4659 Encounter for fitting and adjustment of other gastrointestinal appliance and device: Secondary | ICD-10-CM | POA: Diagnosis present

## 2016-06-05 DIAGNOSIS — K863 Pseudocyst of pancreas: Secondary | ICD-10-CM | POA: Insufficient documentation

## 2016-06-05 DIAGNOSIS — Z9049 Acquired absence of other specified parts of digestive tract: Secondary | ICD-10-CM | POA: Insufficient documentation

## 2016-06-05 DIAGNOSIS — Z79899 Other long term (current) drug therapy: Secondary | ICD-10-CM | POA: Diagnosis not present

## 2016-06-05 DIAGNOSIS — Z4689 Encounter for fitting and adjustment of other specified devices: Secondary | ICD-10-CM | POA: Insufficient documentation

## 2016-06-05 DIAGNOSIS — K219 Gastro-esophageal reflux disease without esophagitis: Secondary | ICD-10-CM | POA: Insufficient documentation

## 2016-06-05 DIAGNOSIS — T859XXS Unspecified complication of internal prosthetic device, implant and graft, sequela: Secondary | ICD-10-CM | POA: Diagnosis not present

## 2016-06-05 DIAGNOSIS — T859XXA Unspecified complication of internal prosthetic device, implant and graft, initial encounter: Secondary | ICD-10-CM | POA: Insufficient documentation

## 2016-06-05 DIAGNOSIS — J302 Other seasonal allergic rhinitis: Secondary | ICD-10-CM | POA: Diagnosis not present

## 2016-06-05 DIAGNOSIS — K839 Disease of biliary tract, unspecified: Secondary | ICD-10-CM | POA: Diagnosis not present

## 2016-06-05 HISTORY — PX: GASTROINTESTINAL STENT REMOVAL: SHX6384

## 2016-06-05 HISTORY — PX: ERCP: SHX5425

## 2016-06-05 SURGERY — ERCP, WITH INTERVENTION IF INDICATED
Anesthesia: General

## 2016-06-05 MED ORDER — ROCURONIUM BROMIDE 100 MG/10ML IV SOLN
INTRAVENOUS | Status: DC | PRN
  Administered 2016-06-05: 30 mg via INTRAVENOUS

## 2016-06-05 MED ORDER — INDOMETHACIN 50 MG RE SUPP
RECTAL | Status: AC
  Filled 2016-06-05: qty 2

## 2016-06-05 MED ORDER — LACTATED RINGERS IV BOLUS (SEPSIS)
1000.0000 mL | Freq: Once | INTRAVENOUS | Status: AC
  Administered 2016-06-05: 1000 mL via INTRAVENOUS

## 2016-06-05 MED ORDER — LABETALOL HCL 5 MG/ML IV SOLN
INTRAVENOUS | Status: AC
  Filled 2016-06-05: qty 4

## 2016-06-05 MED ORDER — IOPAMIDOL (ISOVUE-300) INJECTION 61%
INTRAVENOUS | Status: AC
  Filled 2016-06-05: qty 50

## 2016-06-05 MED ORDER — SUCCINYLCHOLINE CHLORIDE 20 MG/ML IJ SOLN
INTRAMUSCULAR | Status: DC | PRN
  Administered 2016-06-05: 100 mg via INTRAVENOUS

## 2016-06-05 MED ORDER — ONDANSETRON HCL 4 MG/2ML IJ SOLN
INTRAMUSCULAR | Status: DC | PRN
  Administered 2016-06-05: 4 mg via INTRAVENOUS

## 2016-06-05 MED ORDER — MIDAZOLAM HCL 5 MG/5ML IJ SOLN
INTRAMUSCULAR | Status: DC | PRN
  Administered 2016-06-05: 2 mg via INTRAVENOUS

## 2016-06-05 MED ORDER — HYDRALAZINE HCL 20 MG/ML IJ SOLN
INTRAMUSCULAR | Status: AC
  Filled 2016-06-05: qty 1

## 2016-06-05 MED ORDER — LACTATED RINGERS IV SOLN
INTRAVENOUS | Status: DC
  Administered 2016-06-05 (×2): via INTRAVENOUS

## 2016-06-05 MED ORDER — PROPOFOL 10 MG/ML IV BOLUS
INTRAVENOUS | Status: DC | PRN
  Administered 2016-06-05: 200 mg via INTRAVENOUS

## 2016-06-05 MED ORDER — LABETALOL HCL 5 MG/ML IV SOLN: 5.0000 mg | INTRAVENOUS | Status: DC | PRN

## 2016-06-05 MED ORDER — HYDRALAZINE HCL 20 MG/ML IJ SOLN
5.0000 mg | INTRAMUSCULAR | Status: AC | PRN
  Administered 2016-06-05 (×2): 5 mg via INTRAVENOUS

## 2016-06-05 MED ORDER — SODIUM CHLORIDE 0.9 % IV SOLN
INTRAVENOUS | Status: DC | PRN
  Administered 2016-06-05: 10 mL

## 2016-06-05 MED ORDER — SODIUM CHLORIDE 0.9 % IV SOLN: INTRAVENOUS | Status: DC

## 2016-06-05 MED ORDER — CIPROFLOXACIN IN D5W 400 MG/200ML IV SOLN
400.0000 mg | Freq: Once | INTRAVENOUS | Status: AC
  Administered 2016-06-05: 400 mg via INTRAVENOUS

## 2016-06-05 MED ORDER — INDOMETHACIN 50 MG RE SUPP
100.0000 mg | Freq: Once | RECTAL | Status: AC
  Administered 2016-06-05: 100 mg via RECTAL

## 2016-06-05 MED ORDER — GLUCAGON HCL RDNA (DIAGNOSTIC) 1 MG IJ SOLR
INTRAMUSCULAR | Status: AC
  Filled 2016-06-05: qty 1

## 2016-06-05 MED ORDER — LIDOCAINE HCL (CARDIAC) 20 MG/ML IV SOLN
INTRAVENOUS | Status: DC | PRN
Start: 2016-06-05 — End: 2016-06-05
  Administered 2016-06-05: 60 mg via INTRAVENOUS

## 2016-06-05 MED ORDER — SUGAMMADEX SODIUM 200 MG/2ML IV SOLN
INTRAVENOUS | Status: DC | PRN
  Administered 2016-06-05: 142 mg via INTRAVENOUS

## 2016-06-05 MED ORDER — ARTIFICIAL TEARS OP OINT
TOPICAL_OINTMENT | OPHTHALMIC | Status: DC | PRN
  Administered 2016-06-05: 1 via OPHTHALMIC

## 2016-06-05 MED ORDER — FENTANYL CITRATE (PF) 100 MCG/2ML IJ SOLN
INTRAMUSCULAR | Status: DC | PRN
  Administered 2016-06-05 (×2): 50 ug via INTRAVENOUS

## 2016-06-05 NOTE — Anesthesia Preprocedure Evaluation (Addendum)
Anesthesia Evaluation  Patient identified by MRN, date of birth, ID band  Reviewed: Allergy & Precautions, NPO status , Patient's Chart, lab work & pertinent test results  Airway Mallampati: II  TM Distance: >3 FB Neck ROM: Full    Dental  (+) Teeth Intact, Dental Advisory Given   Pulmonary    breath sounds clear to auscultation       Cardiovascular hypertension, Pt. on medications  Rhythm:Regular Rate:Normal     Neuro/Psych    GI/Hepatic GERD  ,  Endo/Other    Renal/GU      Musculoskeletal   Abdominal   Peds  Hematology   Anesthesia Other Findings S/p Lap Chole 3/8 with laparoscopic exploration for pain on 3/13.  S/p bile duct stent for removal  Reproductive/Obstetrics                            Anesthesia Physical  Anesthesia Plan  ASA: II  Anesthesia Plan: General   Post-op Pain Management:    Induction: Intravenous  Airway Management Planned: Oral ETT  Additional Equipment:   Intra-op Plan:   Post-operative Plan: Extubation in OR  Informed Consent: I have reviewed the patients History and Physical, chart, labs and discussed the procedure including the risks, benefits and alternatives for the proposed anesthesia with the patient or authorized representative who has indicated his/her understanding and acceptance.   Dental advisory given  Plan Discussed with: CRNA and Surgeon  Anesthesia Plan Comments:         Anesthesia Quick Evaluation

## 2016-06-05 NOTE — Anesthesia Procedure Notes (Addendum)
Procedure Name: Intubation Date/Time: 06/05/2016 9:07 AM Performed by: Fransisca KaufmannMEYER, Amberleigh Gerken E Pre-anesthesia Checklist: Patient identified, Emergency Drugs available, Suction available and Patient being monitored Patient Re-evaluated:Patient Re-evaluated prior to inductionOxygen Delivery Method: Circle System Utilized Preoxygenation: Pre-oxygenation with 100% oxygen Intubation Type: IV induction Ventilation: Mask ventilation without difficulty Laryngoscope Size: Miller and 2 Grade View: Grade I Tube type: Oral Tube size: 7.5 mm Number of attempts: 1 Airway Equipment and Method: Stylet and Oral airway Placement Confirmation: ETT inserted through vocal cords under direct vision,  positive ETCO2 and breath sounds checked- equal and bilateral Tube secured with: Tape Dental Injury: Teeth and Oropharynx as per pre-operative assessment

## 2016-06-05 NOTE — H&P (Signed)
[facility] History and Physical   Primary Care Physician:  Darreld Mclean, MD   Reason for Procedure:   remove bile duct stent and pre-op ERCP  Plan:    ERCP, remove stent The risks and benefits as well as alternatives of endoscopic procedure(s) have been discussed and reviewed. All questions answered. The patient agrees to proceed.      HPI: Drew Prince is a 40 y.o. male with hx of severe gallstone pancreatitis, s/p lap chole and then bile leak treated with plastic bile duct stent March 2017. To have cyst gastrostomy next week - for ERCP and stent removal today. Has early satiety and LUQ pain, and some RUQ pains at times - sharp.   Past Medical History  Diagnosis Date  . Hypercholesteremia   . H/O seasonal allergies     OTC med used  . Depression   . GERD (gastroesophageal reflux disease)   . Acute pancreatitis     1-8-17to 02-03-16 Hospital stay at [facility].  . Pancreatic necrosis (HCC)   . Pancreatic pseudocyst   . Bile leak   . Hypertension     01-05-16 to 02-03-16 Hospitalized for pancreatitis, B/P med discontinued during that time.  Marland Kitchen PONV (postoperative nausea and vomiting) 03-04-16    ponv after cholecystectomy    Past Surgical History  Procedure Laterality Date  . Wisdom tooth extraction      "local only"  . Laparoscopy N/A 03/09/2016    Procedure: LAPAROSCOPY DIAGNOSTIC;  Surgeon: Axel Filler, MD;  Location: [facility] OR;  Service: General;  Laterality: N/A;  . Hematoma evacuation  03/09/2016    Procedure: LAPAROSCOPIC EVACUATION OF HEMATOMA;  Surgeon: Axel Filler, MD;  Location: [facility] OR;  Service: General;;  . Ercp N/A 03/16/2016    Procedure: ENDOSCOPIC RETROGRADE CHOLANGIOPANCREATOGRAPHY (ERCP);  Surgeon: Meryl Dare, MD;  Location: [facility] ENDOSCOPY;  Service: Endoscopy;  Laterality: N/A;  . Cholecystectomy N/A 03/04/2016    Procedure: LAPAROSCOPIC CHOLECYSTECTOMY ;  Surgeon: Axel Filler, MD;  Location: [facility] ORS;  Service: General;  Laterality: N/A;     Prior to Admission medications   Medication Sig Start Date End Date Taking? Authorizing Provider  acetaminophen (TYLENOL) 500 MG tablet Take 1,000 mg by mouth every 6 (six) hours as needed (For pain.).   Yes Historical Provider, MD  cetirizine (ZYRTEC) 10 MG tablet Take 10 mg by mouth every evening.    Yes Historical Provider, MD  diphenhydrAMINE (BENADRYL) 25 MG tablet Take 25 mg by mouth every evening.    Yes Historical Provider, MD  ondansetron (ZOFRAN-ODT) 4 MG disintegrating tablet Take 1-2 tablets (4-8 mg total) by mouth every 8 (eight) hours as needed for nausea or vomiting. 02/10/16  Yes Ejiroghene E Emokpae, MD  Oxycodone HCl 10 MG TABS Take 10 mg by mouth every 6 (six) hours as needed (For pain.).  04/27/16  Yes Historical Provider, MD  pantoprazole (PROTONIX) 40 MG tablet Take 1 tablet (40 mg total) by mouth every evening. 06/02/16  Yes Darreld Mclean, MD    Current Facility-Administered Medications  Medication Dose Route Frequency Provider Last Rate Last Dose  . 0.9 %  sodium chloride infusion   Intravenous Continuous Iva Boop, MD      . 0.9 %  sodium chloride infusion   Intravenous Continuous Iva Boop, MD      . ciprofloxacin (CIPRO) IVPB 400 mg  400 mg Intravenous Once Iva Boop, MD      . indomethacin (INDOCIN) 50 MG suppository 100 mg  100  mg Rectal Once Iva Booparl E Deshara Rossi, MD      . lactated ringers infusion   Intravenous Continuous Iva Booparl E Addaleigh Nicholls, MD 50 mL/hr at 06/05/16 [id number]      Allergies as of 05/14/2016 - Review Complete 04/21/2016  Allergen Reaction Noted  . Penicillins Rash 01/05/2016    Family History  Problem Relation Age of Onset  . Hypertension Father     Social History   Social History  . Marital Status: Married    Spouse Name: N/A  . Number of Children: 1  . Years of Education: N/A   Occupational History  . Pharmacy Tech    Social History Main Topics  . Smoking status: Never Smoker   . Smokeless tobacco: Never Used  . Alcohol Use:  0.0 oz/week    0 Standard drinks or equivalent per week     Comment: rarely  . Drug Use: No  . Sexual Activity: Not on file   Other Topics Concern  . Not on file   Social History Narrative    Review of Systems:  All other review of systems negative except as mentioned in the HPI.  Physical Exam: Vital signs in last 24 hours: Temp:  [98.4 F (36.9 C)] 98.4 F (36.9 C) (06/09 0705) Pulse Rate:  [84] 84 (06/09 0705) Resp:  [10] 10 (06/09 0705) BP: (159)/(100) 159/100 mmHg (06/09 0705) SpO2:  [98 %] 98 % (06/09 0705)   General:   Alert,  Well-developed, well-nourished, pleasant and cooperative in NAD Lungs:  Clear throughout to auscultation.   Heart:  Regular rate and rhythm; no murmurs, clicks, rubs,  or gallops. Abdomen:  Soft, nontender and nondistended. Normal bowel sounds.  He is tender on right costal margin -ribs tender Neuro/Psych:  Alert and cooperative. Normal mood and affect. A and O x 3   @[name]  Sena SlateE. Jaymee Tilson, MD, Antionette FairyFACG [facility] [phone] (pager) 06/05/2016 7:47 AM@

## 2016-06-05 NOTE — Anesthesia Postprocedure Evaluation (Signed)
Anesthesia Post Note  Patient: Drew Prince  Procedure(s) Performed: Procedure(s) (LRB): ENDOSCOPIC RETROGRADE CHOLANGIOPANCREATOGRAPHY (ERCP) (N/A) GASTROINTESTINAL STENT REMOVAL (N/A)  Patient location during evaluation: PACU Anesthesia Type: General Level of consciousness: awake and alert Pain management: pain level controlled Vital Signs Assessment: post-procedure vital signs reviewed and stable Respiratory status: spontaneous breathing, nonlabored ventilation, respiratory function stable and patient connected to nasal cannula oxygen Cardiovascular status: blood pressure returned to baseline and stable Postop Assessment: no signs of nausea or vomiting Anesthetic complications: no    Last Vitals:  Filed Vitals:   06/05/16 1103 06/05/16 1112  BP: 167/96 169/96  Pulse: 87 91  Temp:    Resp: 16 19    Last Pain:  Filed Vitals:   06/05/16 1116  PainSc: 5                  Kennieth RadFitzgerald, Truitt Cruey E

## 2016-06-05 NOTE — Discharge Instructions (Addendum)
° °  I removed the stent - the leak is resolved. Pancreas looks normal to middle then the duct does not fill - not surprising - think this is where it is damaged.  Please stay on clear liquids today until about noon then smoothies and full liquids ok, and try soft low fat foods tomorrow.  Good luck with your surgery next week.  I appreciate the opportunity to care for you. Iva Booparl E. Lawrie Tunks, MD, FACG  YOU HAD AN ENDOSCOPIC PROCEDURE TODAY: Refer to the procedure report and other information in the discharge instructions given to you for any specific questions about what was found during the examination. If this information does not answer your questions, please call Dr. Marvell FullerGessner's office at [phone] to clarify.   YOU SHOULD EXPECT: Some feelings of bloating in the abdomen. Passage of more gas than usual. Walking can help get rid of the air that was put into your GI tract during the procedure and reduce the bloating. If you had a lower endoscopy (such as a colonoscopy or flexible sigmoidoscopy) you may notice spotting of blood in your stool or on the toilet paper. Some abdominal soreness may be present for a day or two, also.  DIET:  See above  ACTIVITY: Your care partner should take you home directly after the procedure. You should plan to take it easy, moving slowly for the rest of the day. You can resume normal activity the day after the procedure however YOU SHOULD NOT DRIVE, use power tools, machinery or perform tasks that involve climbing or major physical exertion for 24 hours (because of the sedation medicines used during the test).   SYMPTOMS TO REPORT IMMEDIATELY: A gastroenterologist can be reached at any hour. Please call [phone]  for any of the following symptoms:   Following upper endoscopy (EGD, EUS, ERCP, esophageal dilation) Vomiting of blood or coffee ground material  New, significant abdominal pain  New, significant chest pain or pain under the shoulder blades    Painful or persistently difficult swallowing  New shortness of breath  Black, tarry-looking or red, bloody stools  .

## 2016-06-05 NOTE — Transfer of Care (Signed)
Immediate Anesthesia Transfer of Care Note  Patient: Drew Prince  Procedure(s) Performed: Procedure(s): ENDOSCOPIC RETROGRADE CHOLANGIOPANCREATOGRAPHY (ERCP) (N/A) GASTROINTESTINAL STENT REMOVAL (N/A)  Patient Location: PACU and Endoscopy Unit  Anesthesia Type:General  Level of Consciousness: awake, alert , oriented and sedated  Airway & Oxygen Therapy: Patient Spontanous Breathing and Patient connected to face mask oxygen  Post-op Assessment: Report given to RN, Post -op Vital signs reviewed and stable and Patient moving all extremities  Post vital signs: Reviewed and stable  Last Vitals:  Filed Vitals:   06/05/16 0705  BP: 159/100  Pulse: 84  Temp: 36.9 C  Resp: 10    Last Pain: There were no vitals filed for this visit.       Complications: No apparent anesthesia complications

## 2016-06-05 NOTE — Progress Notes (Signed)
Per Dr. Sampson GoonFitzgerald, ok for discharge once SBP <180, DBP<100, pt's BP now 160s/90s.  Will discharge to home with wife.  Roselie AwkwardShannon Neliah Cuyler, RN

## 2016-06-05 NOTE — Op Note (Signed)
[facility] Patient Name: Drew Prince Procedure Date : 06/05/2016 MRN: [id number] Attending MD: Iva Boop , MD Date of Birth: 06-26-1976 CSN: [id number] Age: 40 Admit Type: Outpatient Procedure:                ERCP Indications:              Pancreatic pseudocyst, Biliary stent removal, Preop                            exam: Endoscopic transgastric pseudocyst drainage Providers:                Iva Boop, MD, Dwain Sarna, RN, Jacquiline Doe, RN, Arlee Muslim, Technician, Frederich Cha,                            CRNA Referring MD:              Medicines:                General Anesthesia, Cipro 400 mg IV Complications:            No immediate complications. Estimated Blood Loss:     Estimated blood loss: none. Procedure:                Pre-Anesthesia Assessment:                           - Prior to the procedure, a History and Physical                            was performed, and patient medications and                            allergies were reviewed. The patient's tolerance of                            previous anesthesia was also reviewed. The risks                            and benefits of the procedure and the sedation                            options and risks were discussed with the patient.                            All questions were answered, and informed consent                            was obtained. Prior Anticoagulants: The patient has                            taken no previous anticoagulant or antiplatelet  agents. ASA Grade Assessment: II - A patient with                            mild systemic disease. After reviewing the risks                            and benefits, the patient was deemed in                            satisfactory condition to undergo the procedure.                           After obtaining informed consent, the scope was                            passed  under direct vision. Throughout the                            procedure, the patient's blood pressure, pulse, and                            oxygen saturations were monitored continuously. The                            [id number]) scope was introduced through                            the mouth, and used to inject contrast into and                            used to inject contrast into the bile duct and                            ventral pancreatic duct. The ERCP was accomplished                            without difficulty. The patient tolerated the                            procedure well. Scope In: Scope Out: Findings:      A biliary stent was visible on the scout film. The scope was passed       through the upper GI tract without discovering UGI findings. One plastic       stent originating in the common bile duct was emerging from the major       papilla. The stent was visibly patent. One stent was removed from the       common bile duct using a snare. The bile duct was deeply cannulated with       the short-nosed traction sphincterotome. Contrast was injected.       Opacification of the entire biliary tree except for the gallbladder was       successful. The maximum diameter of the ducts was 11 mm. There was no       extravasation of contrast. The common  bile duct was diffusely dilated,       acquired. The largest diameter was 11mm. A cholecystectomy had been       performed. The ventral pancreatic duct was deeply cannulated with the       short-nosed traction sphincterotome. Contrast was injected. The       pancreatic duct in the body of the pancreas was completely obstructed by       a known pseudocyst. Impression:               - One visibly patent stent from the common bile                            duct was seen in the major papilla.                           - The common bile duct was dilated, acquired.                           - The patient has had a  cholecystectomy.                           - A pancreatic obstruction secondary to a known                            pseudocyst was found in the body of the pancreas.                           - One stent was removed from the common bile duct. Recommendation:           - Discharge patient to home (ambulatory).                           - Having cystgastrostomy next week                           - Clear liquid diet today.                           - Low fat diet after today. Procedure Code(s):        --- Professional ---                           [phone], Endoscopic retrograde                            cholangiopancreatography (ERCP); with removal of                            foreign body(s) or stent(s) from biliary/pancreatic                            duct(s) Diagnosis Code(s):        --- Professional ---                           A21.30, Presence of other specified functional  implants                           K86.89, Other specified diseases of pancreas                           K86.3, Pseudocyst of pancreas                           Z46.59, Encounter for fitting and adjustment of                            other gastrointestinal appliance and device                           Z01.818, Encounter for other preprocedural                            examination CPT copyright 2016 [employer] Association. All rights reserved. The codes documented in this report are preliminary and upon coder review may  be revised to meet current compliance requirements. Iva Boop, MD 06/05/2016 10:02:27 AM This report has been signed electronically. Number of Addenda: 0

## 2016-06-05 NOTE — Progress Notes (Addendum)
Pt's BP 170s-180s/110s-120s (MAPs 120s), pt resting comfortably in no distress.  Dr. Sampson GoonFitzgerald notified, orders received for IV hydralazine.  Will continue to monitor.  Roselie AwkwardShannon Claudene Gatliff, RN

## 2016-06-06 ENCOUNTER — Encounter (HOSPITAL_COMMUNITY): Payer: Self-pay | Admitting: Internal Medicine

## 2016-06-10 ENCOUNTER — Encounter (HOSPITAL_COMMUNITY)
Admission: RE | Disposition: A | Discharge status: Home / Self Care | Payer: Self-pay | Source: Ambulatory Visit | Attending: General Surgery

## 2016-06-10 ENCOUNTER — Inpatient Hospital Stay (HOSPITAL_COMMUNITY): Payer: BLUE CROSS/BLUE SHIELD | Admitting: Certified Registered"

## 2016-06-10 ENCOUNTER — Inpatient Hospital Stay (HOSPITAL_COMMUNITY)
Admission: RE | Admit: 2016-06-10 | Discharge: 2016-06-16 | DRG: 423 | Disposition: A | Discharge status: Home / Self Care | Payer: BLUE CROSS/BLUE SHIELD | Source: Ambulatory Visit | Attending: General Surgery | Admitting: General Surgery

## 2016-06-10 ENCOUNTER — Encounter (HOSPITAL_COMMUNITY): Payer: Self-pay | Admitting: *Deleted

## 2016-06-10 DIAGNOSIS — I10 Essential (primary) hypertension: Secondary | ICD-10-CM | POA: Diagnosis present

## 2016-06-10 DIAGNOSIS — K863 Pseudocyst of pancreas: Secondary | ICD-10-CM | POA: Diagnosis present

## 2016-06-10 DIAGNOSIS — K859 Acute pancreatitis without necrosis or infection, unspecified: Secondary | ICD-10-CM | POA: Diagnosis present

## 2016-06-10 DIAGNOSIS — K920 Hematemesis: Secondary | ICD-10-CM | POA: Diagnosis present

## 2016-06-10 DIAGNOSIS — Z9889 Other specified postprocedural states: Secondary | ICD-10-CM

## 2016-06-10 DIAGNOSIS — R1012 Left upper quadrant pain: Secondary | ICD-10-CM | POA: Diagnosis present

## 2016-06-10 DIAGNOSIS — E785 Hyperlipidemia, unspecified: Secondary | ICD-10-CM | POA: Diagnosis present

## 2016-06-10 HISTORY — PX: LAPAROSCOPIC GASTROSTOMY: SHX5896

## 2016-06-10 SURGERY — CREATION, GASTROSTOMY, LAPAROSCOPIC
Anesthesia: General

## 2016-06-10 MED ORDER — ONDANSETRON HCL 4 MG/2ML IJ SOLN
INTRAMUSCULAR | Status: AC
  Filled 2016-06-10: qty 2

## 2016-06-10 MED ORDER — SUGAMMADEX SODIUM 200 MG/2ML IV SOLN
INTRAVENOUS | Status: DC | PRN
  Administered 2016-06-10: 200 mg via INTRAVENOUS

## 2016-06-10 MED ORDER — HYDROMORPHONE HCL 2 MG/ML IJ SOLN
INTRAMUSCULAR | Status: AC
  Filled 2016-06-10: qty 1

## 2016-06-10 MED ORDER — FENTANYL CITRATE (PF) 250 MCG/5ML IJ SOLN
INTRAMUSCULAR | Status: AC
  Filled 2016-06-10: qty 5

## 2016-06-10 MED ORDER — BUPIVACAINE-EPINEPHRINE 0.25% -1:200000 IJ SOLN
INTRAMUSCULAR | Status: AC
  Filled 2016-06-10: qty 1

## 2016-06-10 MED ORDER — LABETALOL HCL 5 MG/ML IV SOLN
INTRAVENOUS | Status: AC
  Filled 2016-06-10: qty 4

## 2016-06-10 MED ORDER — HYDROMORPHONE HCL 1 MG/ML IJ SOLN
INTRAMUSCULAR | Status: AC
  Administered 2016-06-10: 1 mg via INTRAVENOUS
  Filled 2016-06-10: qty 1

## 2016-06-10 MED ORDER — ROCURONIUM BROMIDE 100 MG/10ML IV SOLN
INTRAVENOUS | Status: DC | PRN
  Administered 2016-06-10: 10 mg via INTRAVENOUS
  Administered 2016-06-10: 50 mg via INTRAVENOUS

## 2016-06-10 MED ORDER — OXYCODONE HCL 5 MG PO TABS
5.0000 mg | ORAL_TABLET | ORAL | Status: DC | PRN
  Administered 2016-06-10 – 2016-06-16 (×23): 10 mg via ORAL
  Filled 2016-06-10 (×23): qty 2

## 2016-06-10 MED ORDER — METRONIDAZOLE IN NACL 5-0.79 MG/ML-% IV SOLN
500.0000 mg | INTRAVENOUS | Status: AC
  Administered 2016-06-10: 500 mg via INTRAVENOUS
  Filled 2016-06-10: qty 100

## 2016-06-10 MED ORDER — ONDANSETRON HCL 4 MG/2ML IJ SOLN
4.0000 mg | Freq: Four times a day (QID) | INTRAMUSCULAR | Status: DC | PRN
Start: 2016-06-10 — End: 2016-06-16
  Administered 2016-06-10 – 2016-06-14 (×3): 4 mg via INTRAVENOUS
  Filled 2016-06-10 (×3): qty 2

## 2016-06-10 MED ORDER — PHENYLEPHRINE HCL 10 MG/ML IJ SOLN
INTRAMUSCULAR | Status: DC | PRN
  Administered 2016-06-10: 80 ug via INTRAVENOUS

## 2016-06-10 MED ORDER — LACTATED RINGERS IV SOLN
INTRAVENOUS | Status: DC
  Administered 2016-06-10: 12:00:00 via INTRAVENOUS

## 2016-06-10 MED ORDER — BUPIVACAINE HCL (PF) 0.25 % IJ SOLN
INTRAMUSCULAR | Status: AC
  Filled 2016-06-10: qty 30

## 2016-06-10 MED ORDER — LACTATED RINGERS IR SOLN
Status: DC | PRN
Start: 2016-06-10 — End: 2016-06-10
  Administered 2016-06-10: 3000 mL

## 2016-06-10 MED ORDER — DEXTROSE-NACL 5-0.9 % IV SOLN
INTRAVENOUS | Status: DC
  Administered 2016-06-10 – 2016-06-15 (×10): via INTRAVENOUS
  Administered 2016-06-15: 1000 mL via INTRAVENOUS
  Administered 2016-06-15 – 2016-06-16 (×2): via INTRAVENOUS

## 2016-06-10 MED ORDER — DEXAMETHASONE SODIUM PHOSPHATE 10 MG/ML IJ SOLN
INTRAMUSCULAR | Status: DC | PRN
  Administered 2016-06-10: 10 mg via INTRAVENOUS

## 2016-06-10 MED ORDER — ENOXAPARIN SODIUM 40 MG/0.4ML ~~LOC~~ SOLN
40.0000 mg | SUBCUTANEOUS | Status: DC
  Administered 2016-06-10: 40 mg via SUBCUTANEOUS
  Filled 2016-06-10 (×2): qty 0.4

## 2016-06-10 MED ORDER — LABETALOL HCL 5 MG/ML IV SOLN
10.0000 mg | Freq: Once | INTRAVENOUS | Status: AC
  Administered 2016-06-10: 10 mg via INTRAVENOUS

## 2016-06-10 MED ORDER — HYDRALAZINE HCL 20 MG/ML IJ SOLN
10.0000 mg | INTRAMUSCULAR | Status: DC | PRN
  Administered 2016-06-11: 10 mg via INTRAVENOUS
  Filled 2016-06-10 (×2): qty 1

## 2016-06-10 MED ORDER — LACTATED RINGERS IV SOLN
INTRAVENOUS | Status: DC | PRN
  Administered 2016-06-10 (×2): via INTRAVENOUS

## 2016-06-10 MED ORDER — PANTOPRAZOLE SODIUM 40 MG PO TBEC
40.0000 mg | DELAYED_RELEASE_TABLET | Freq: Every evening | ORAL | Status: DC
  Administered 2016-06-10: 40 mg via ORAL
  Filled 2016-06-10 (×2): qty 1

## 2016-06-10 MED ORDER — HYDROMORPHONE HCL 1 MG/ML IJ SOLN
1.0000 mg | INTRAMUSCULAR | Status: DC | PRN
  Administered 2016-06-10 (×3): 1 mg via INTRAVENOUS
  Administered 2016-06-11 – 2016-06-12 (×10): 2 mg via INTRAVENOUS
  Administered 2016-06-13: 1 mg via INTRAVENOUS
  Administered 2016-06-13 – 2016-06-14 (×3): 2 mg via INTRAVENOUS
  Administered 2016-06-14 (×2): 1 mg via INTRAVENOUS
  Administered 2016-06-14 – 2016-06-16 (×6): 2 mg via INTRAVENOUS
  Filled 2016-06-10 (×2): qty 2
  Filled 2016-06-10: qty 1
  Filled 2016-06-10 (×7): qty 2
  Filled 2016-06-10: qty 1
  Filled 2016-06-10 (×4): qty 2
  Filled 2016-06-10: qty 1
  Filled 2016-06-10 (×6): qty 2
  Filled 2016-06-10: qty 1
  Filled 2016-06-10: qty 2
  Filled 2016-06-10: qty 1

## 2016-06-10 MED ORDER — LIDOCAINE HCL (CARDIAC) 20 MG/ML IV SOLN
INTRAVENOUS | Status: AC
  Filled 2016-06-10: qty 5

## 2016-06-10 MED ORDER — CIPROFLOXACIN IN D5W 400 MG/200ML IV SOLN
INTRAVENOUS | Status: AC
Start: 2016-06-10 — End: 2016-06-10
  Filled 2016-06-10: qty 200

## 2016-06-10 MED ORDER — PROPOFOL 10 MG/ML IV BOLUS
INTRAVENOUS | Status: DC | PRN
  Administered 2016-06-10: 200 mg via INTRAVENOUS

## 2016-06-10 MED ORDER — PROMETHAZINE HCL 25 MG/ML IJ SOLN
6.2500 mg | INTRAMUSCULAR | Status: AC | PRN
  Administered 2016-06-10 (×2): 6.25 mg via INTRAVENOUS

## 2016-06-10 MED ORDER — ONDANSETRON 4 MG PO TBDP: 4.0000 mg | ORAL_TABLET | Freq: Four times a day (QID) | ORAL | Status: DC | PRN

## 2016-06-10 MED ORDER — HYDROMORPHONE HCL 1 MG/ML IJ SOLN
INTRAMUSCULAR | Status: DC | PRN
  Administered 2016-06-10: 0.5 mg via INTRAVENOUS
  Administered 2016-06-10: .25 mg via INTRAVENOUS
  Administered 2016-06-10: 0.5 mg via INTRAVENOUS
  Administered 2016-06-10: .25 mg via INTRAVENOUS

## 2016-06-10 MED ORDER — SENNOSIDES-DOCUSATE SODIUM 8.6-50 MG PO TABS
1.0000 | ORAL_TABLET | Freq: Every day | ORAL | Status: DC
  Administered 2016-06-10 – 2016-06-16 (×6): 1 via ORAL
  Filled 2016-06-10 (×7): qty 1

## 2016-06-10 MED ORDER — ONDANSETRON HCL 4 MG/2ML IJ SOLN
INTRAMUSCULAR | Status: DC | PRN
  Administered 2016-06-10: 4 mg via INTRAVENOUS

## 2016-06-10 MED ORDER — PROMETHAZINE HCL 25 MG/ML IJ SOLN
INTRAMUSCULAR | Status: AC
Start: 2016-06-10 — End: 2016-06-11
  Filled 2016-06-10: qty 1

## 2016-06-10 MED ORDER — FENTANYL CITRATE (PF) 250 MCG/5ML IJ SOLN
INTRAMUSCULAR | Status: DC | PRN
  Administered 2016-06-10 (×4): 50 ug via INTRAVENOUS
  Administered 2016-06-10: 100 ug via INTRAVENOUS
  Administered 2016-06-10: 50 ug via INTRAVENOUS
  Administered 2016-06-10: 100 ug via INTRAVENOUS
  Administered 2016-06-10: 50 ug via INTRAVENOUS

## 2016-06-10 MED ORDER — PHENYLEPHRINE 40 MCG/ML (10ML) SYRINGE FOR IV PUSH (FOR BLOOD PRESSURE SUPPORT)
PREFILLED_SYRINGE | INTRAVENOUS | Status: AC
Start: 2016-06-10 — End: 2016-06-10
  Filled 2016-06-10: qty 10

## 2016-06-10 MED ORDER — EPHEDRINE SULFATE 50 MG/ML IJ SOLN
INTRAMUSCULAR | Status: AC
  Filled 2016-06-10: qty 1

## 2016-06-10 MED ORDER — METRONIDAZOLE IN NACL 5-0.79 MG/ML-% IV SOLN
INTRAVENOUS | Status: AC
  Filled 2016-06-10: qty 100

## 2016-06-10 MED ORDER — MIDAZOLAM HCL 2 MG/2ML IJ SOLN
INTRAMUSCULAR | Status: AC
  Filled 2016-06-10: qty 2

## 2016-06-10 MED ORDER — MIDAZOLAM HCL 2 MG/2ML IJ SOLN
INTRAMUSCULAR | Status: DC | PRN
  Administered 2016-06-10 (×2): 1 mg via INTRAVENOUS

## 2016-06-10 MED ORDER — SODIUM CHLORIDE 0.9 % IJ SOLN
INTRAMUSCULAR | Status: AC
Start: 2016-06-10 — End: 2016-06-10
  Filled 2016-06-10: qty 10

## 2016-06-10 MED ORDER — BUPIVACAINE-EPINEPHRINE 0.25% -1:200000 IJ SOLN
INTRAMUSCULAR | Status: DC | PRN
  Administered 2016-06-10: 10 mL

## 2016-06-10 MED ORDER — PROPOFOL 10 MG/ML IV BOLUS
INTRAVENOUS | Status: AC
  Filled 2016-06-10: qty 20

## 2016-06-10 MED ORDER — SIMETHICONE 80 MG PO CHEW: 40.0000 mg | CHEWABLE_TABLET | Freq: Four times a day (QID) | ORAL | Status: DC | PRN

## 2016-06-10 MED ORDER — HYDROMORPHONE HCL 1 MG/ML IJ SOLN
0.2500 mg | INTRAMUSCULAR | Status: DC | PRN
  Administered 2016-06-10 (×3): 0.25 mg via INTRAVENOUS
  Administered 2016-06-10 (×2): 0.5 mg via INTRAVENOUS
  Administered 2016-06-10: 0.25 mg via INTRAVENOUS

## 2016-06-10 MED ORDER — LIDOCAINE HCL (CARDIAC) 20 MG/ML IV SOLN
INTRAVENOUS | Status: DC | PRN
  Administered 2016-06-10: 60 mg via INTRAVENOUS

## 2016-06-10 MED ORDER — SODIUM CHLORIDE 0.9 % IJ SOLN
INTRAMUSCULAR | Status: AC
  Filled 2016-06-10: qty 10

## 2016-06-10 MED ORDER — ROCURONIUM BROMIDE 100 MG/10ML IV SOLN
INTRAVENOUS | Status: AC
Start: 2016-06-10 — End: 2016-06-10
  Filled 2016-06-10: qty 1

## 2016-06-10 MED ORDER — CIPROFLOXACIN IN D5W 400 MG/200ML IV SOLN
400.0000 mg | INTRAVENOUS | Status: AC
  Administered 2016-06-10: 400 mg via INTRAVENOUS

## 2016-06-10 MED ORDER — CHLORHEXIDINE GLUCONATE 4 % EX LIQD: 1.0000 "application " | Freq: Once | CUTANEOUS | Status: DC

## 2016-06-10 MED ORDER — PHENYLEPHRINE HCL 10 MG/ML IJ SOLN
INTRAMUSCULAR | Status: AC
  Filled 2016-06-10: qty 1

## 2016-06-10 SURGICAL SUPPLY — 32 items
BENZOIN TINCTURE PRP APPL 2/3 (GAUZE/BANDAGES/DRESSINGS) ×2 IMPLANT
CHLORAPREP W/TINT 26ML (MISCELLANEOUS) ×2 IMPLANT
COVER SURGICAL LIGHT HANDLE (MISCELLANEOUS) ×2 IMPLANT
CUTTER FLEX LINEAR 45M (STAPLE) ×2 IMPLANT
DEVICE TROCAR PUNCTURE CLOSURE (ENDOMECHANICALS) ×2 IMPLANT
DRAPE LAPAROSCOPIC ABDOMINAL (DRAPES) ×2 IMPLANT
DRSG TEGADERM 2-3/8X2-3/4 SM (GAUZE/BANDAGES/DRESSINGS) ×10 IMPLANT
ELECT REM PT RETURN 9FT ADLT (ELECTROSURGICAL) ×2
ELECTRODE REM PT RTRN 9FT ADLT (ELECTROSURGICAL) ×1 IMPLANT
GAUZE SPONGE 2X2 8PLY STRL LF (GAUZE/BANDAGES/DRESSINGS) ×2 IMPLANT
GLOVE BIO SURGEON STRL SZ7.5 (GLOVE) ×2 IMPLANT
GOWN STRL REUS W/TWL XL LVL3 (GOWN DISPOSABLE) ×8 IMPLANT
KIT BASIN OR (CUSTOM PROCEDURE TRAY) ×2 IMPLANT
NEEDLE INSUFFLATION 14GA 120MM (NEEDLE) ×2 IMPLANT
PAD POSITIONING PINK XL (MISCELLANEOUS) ×2 IMPLANT
RELOAD STAPLE TA45 3.5 REG BLU (ENDOMECHANICALS) ×6 IMPLANT
RUBBERBAND STERILE (MISCELLANEOUS) ×2 IMPLANT
SET IRRIG TUBING LAPAROSCOPIC (IRRIGATION / IRRIGATOR) ×2 IMPLANT
SHEARS HARMONIC ACE PLUS 36CM (ENDOMECHANICALS) ×2 IMPLANT
SLEEVE XCEL OPT CAN 5 100 (ENDOMECHANICALS) ×4 IMPLANT
SPONGE DRAIN TRACH 4X4 STRL 2S (GAUZE/BANDAGES/DRESSINGS) ×2 IMPLANT
SPONGE GAUZE 2X2 STER 10/PKG (GAUZE/BANDAGES/DRESSINGS) ×2
SUT ETHILON 2 0 PS N (SUTURE) ×2 IMPLANT
SUT MNCRL AB 4-0 PS2 18 (SUTURE) ×2 IMPLANT
SUT SILK 2 0 SH (SUTURE) ×6 IMPLANT
TOWEL OR 17X26 10 PK STRL BLUE (TOWEL DISPOSABLE) ×2 IMPLANT
TOWEL OR NON WOVEN STRL DISP B (DISPOSABLE) ×2 IMPLANT
TRAY LAPAROSCOPIC (CUSTOM PROCEDURE TRAY) ×2 IMPLANT
TROCAR ADV FIXATION 5X100MM (TROCAR) ×6 IMPLANT
TROCAR BLADELESS OPT 5 100 (ENDOMECHANICALS) ×2 IMPLANT
TROCAR XCEL NON-BLD 11X100MML (ENDOMECHANICALS) ×2 IMPLANT
TUBING INSUF HEATED (TUBING) ×2 IMPLANT

## 2016-06-10 NOTE — Op Note (Signed)
06/10/2016  10:55 AM  PATIENT:  Drew Prince  40 y.o. male  PRE-OPERATIVE DIAGNOSIS:  Chronic Pancreatic pseudocyst   POST-OPERATIVE DIAGNOSIS: Chronic Pancreatic pseudocyst   PROCEDURE:  Procedure(s): LAPAROSCOPIC CYST GASTROSTOMY, INTRAOPERATIVE ULTRASOUND,  UPPER ENDOSCOPY  (N/A)  SURGEON:  Surgeon(s) and Role:    * Axel FillerArmando Keyanah Kozicki, MD - Primary    * Karie SodaSteven Gross, MD - Assisting  ANESTHESIA:   local and general  EBL:  Total I/O In: 1000 [I.V.:1000] Out: 175 [Urine:150; Blood:25]  BLOOD ADMINISTERED:none  DRAINS: none   LOCAL MEDICATIONS USED:  BUPIVICAINE   SPECIMEN:  No Specimen  DISPOSITION OF SPECIMEN:  N/A  COUNTS:  YES  TOURNIQUET:  * No tourniquets in log *  DICTATION: .Dragon Dictation   Indication for procedure: Patient is a 40 year old male with a history of biliary pancreatitis, status post laparoscopic cholecystectomy with a postop bile leak. Post pancreatitis patient developed a pseudocyst which was unresolved after following for several months. Patient had symptoms of early satiety, abdominal pain, back pain. Patient was CT scan several months postoperatively which revealed a persistent 5 x 7 cm pseudocyst. Patient was counseled in clinic and decided to proceed with pseudocyst drainage.  Details procedure: After the patient was consented he was taken back to the operating room and placed in the supine position with bilateral SCDs in place. Patient underwent general endotracheal anesthesia. Patient for catheter placed. Patient was prepped and draped in a sterile fashion. A timeout was called all facts verified.  A Veress needle technique was used to insufflate the abdomen to 15 mmHg in the right lower quadrant. Subsequent to this a 5 mm trocar and camera placed intra-abdominally. There is no injury to any intra-abdominal organs. There is a minimal amount of wispy adhesions to the midline and right upper quadrant. These were taken down sharply. At this  time a 5 mm trocar was placed just at the umbilicus under direct visualization.  At this time Dr. gross introduced the EGD scope into the stomach. The stomach was distended with insufflation. The area of the stenosis was visualized and appeared to be bulging the posterior gastric wall. At this time to balloon tipped trocars 2 were placed into the stomach. The laparoscope was placed into the stomach. One of the 5mm trochars were upsized to a 12 mm balloon tip trocar. A third 5 mm balloon tip trocar was placed more proximally on the stomach wall as a working port. At this time a laparoscopic ultrasound was introduced into the Stomach. The Pseudocyst Was Visualized. We visualized the cyst for flow. There was no flow. At this time a large aspiration needle was used to puncture the cyst in the posterior wall of the stomach.  There was minimal drainage from the area. There did appear to be thick cottage cheeselike substance within the pseudocyst wall.  At this time a harmonic scalpel was used to create a opening for the wall. This created a space for the endoscopic stapler. The 45 mm blue load stapler was then placed into the defect and the lead cephalad. The staple fire properly. This created a area was able to visualize the pseudocyst cavity which appeared to be again filled with cottage cheeselike substance. This area was irrigated out with multiple liters sterile saline. This allowed some of the more solid substance to be irrigated from the pseudocyst cavity. A second and third firing of the pseudocyst wall to create this is gastrostomy was undertaken and a more distal fashion. Entrance was made using  the Harmonic scalpel more distally. The parents entirety 3 firings of the stapler, blue load were undertaken.  Multiple liters of saline were used to wash out the pseudocyst cavity and debridement were undertaken of the pseudocyst. At this time we were happy with the cyst gastrostomy. The trocar was then removed from  the gastric wall.    2-0 silk's were used to reapproximate the gastric trocar sites in a figure-of-eight fashion 3. The endoscope was inserted into the stomach. Insufflation was begun. In the suture trocar sites were seen to be airtight. At this time insufflation was suctioned out of the stomach. The fascia of the 12 mm trocar site was then reapproximated using an Endo Close device and 0 Vicryl. All trochars removed.   All trocar sites were then reapproximated using 4-0 Monocryl subcuticular fashion. The skin was dressed with Surgicel gauze and tape. The patient was awakened from general anesthesia and taken to the recovery in stable condition.     PLAN: Admit to inpatient   PATIENT DISPOSITION:  PACU - hemodynamically stable.   Delay start of Pharmacological VTE agent (>24hrs) due to surgical blood loss or risk of bleeding: yes

## 2016-06-10 NOTE — Anesthesia Postprocedure Evaluation (Signed)
Anesthesia Post Note  Patient: Drew Prince  Procedure(s) Performed: Procedure(s) (LRB): LAPAROSCOPIC CYST GASTROSTOMY, INTRAOPERATIVE ULTRASOUND,  UPPER ENDOSCOPY  (N/A)  Patient location during evaluation: PACU Anesthesia Type: General Level of consciousness: awake and alert Pain management: pain level controlled Vital Signs Assessment: post-procedure vital signs reviewed and stable Respiratory status: spontaneous breathing, nonlabored ventilation, respiratory function stable and patient connected to nasal cannula oxygen Cardiovascular status: blood pressure returned to baseline and stable (BP remains elevated and was near preop value) Postop Assessment: no signs of nausea or vomiting Anesthetic complications: no    Last Vitals:  Filed Vitals:   06/10/16 1348 06/10/16 1400  BP:  163/111  Pulse: 98 100  Temp:  36.8 C  Resp: 17 10    Last Pain:  Filed Vitals:   06/10/16 1406  PainSc: Asleep                 Shevaun Lovan,JAMES TERRILL

## 2016-06-10 NOTE — H&P (View-Only) (Signed)
History of Present Illness Drew Prince(Drew Rakestraw MD; 04/27/2016 10:45 AM) The patient is a 40 year old male who presents with acute pancreatitis. Patient is a 40 year old male with a documented history of for continued pseudocysts. Patient recently underwent CT scan and had his right upper quadrant drain removed. The right upper quadrant fluid collection at resolved.  Patient continues with left upper quadrant abdominal pain, nausea, decreased appetite, early fullness. She is currently taking pain medication help with the pain. He states this is affecting the pain a minimal amount. CT scan did show a stable pseudocyst of the pancreas.   Allergies Drew Prince(Drew Prince, [place]; 04/27/2016 10:24 AM) PenicillAMINE *Assorted Classes**  Medication History Drew Prince(Drew Prince, CMA; 04/27/2016 10:24 AM) BuPROPion HCl ER (XL) (150MG  Tablet ER 24HR, Oral) Active. Pantoprazole Sodium (40MG  Tablet DR, Oral) Active. Ondansetron (4MG  Tablet Disperse, Oral) Active. OxyCODONE HCl (5MG  Tablet, Oral) Active. Medications Reconciled  Vitals Drew Prince(Drew Prince CMA; 04/27/2016 10:24 AM) 04/27/2016 10:24 AM Weight: 156 lb Height: 64in Body Surface Area: 1.76 m Body Mass Index: 26.78 kg/m  Temp.: 97.33F  Pulse: 100 (Regular)  BP: 128/82 (Sitting, Left Arm, Standard)       Physical Exam Drew Prince(Drew Logiudice MD; 04/27/2016 10:45 AM) General Mental Status-Alert. General Appearance-Consistent with stated age. Hydration-Well hydrated. Voice-Normal.  Chest and Lung Exam Chest and lung exam reveals -quiet, even and easy respiratory effort with no use of accessory muscles and on auscultation, normal breath sounds, no adventitious sounds and normal vocal resonance. Inspection Chest Wall - Normal. Back - normal.  Cardiovascular Cardiovascular examination reveals -normal heart sounds, regular rate and rhythm with no murmurs and normal pedal pulses bilaterally.  Abdomen Note: Left upper quadrant abdominal  pain.     Assessment & Plan Drew Prince(Drew Riebel MD; 04/27/2016 10:49 AM) PSEUDOCYST OF PANCREAS (K86.3) Impression: 40 year old male with a pancreatic pseudocyst. Pseudocyst probably been stable for the last 2 months. Patient continues to be symptomatic from the pseudocyst. Patient's artery was removed based on the resolution of his right upper quadrant fluid collection  1. We will schedule the patient for laparoscopic cyst gastrostomy. 2. I discussed with him the risks benefits the procedure to include but not limited to: Infection, bleeding, damage to structures, possible recurrence. Patient was understanding and wished to proceed.

## 2016-06-10 NOTE — Anesthesia Procedure Notes (Signed)
Procedure Name: Intubation Date/Time: 06/10/2016 8:45 AM Performed by: Minerva EndsMIRARCHI, Lazlo Tunney M Pre-anesthesia Checklist: Patient identified, Emergency Drugs available, Suction available and Patient being monitored Patient Re-evaluated:Patient Re-evaluated prior to inductionOxygen Delivery Method: Circle System Utilized Preoxygenation: Pre-oxygenation with 100% oxygen Intubation Type: IV induction Ventilation: Mask ventilation without difficulty Laryngoscope Size: Miller and 2 Grade View: Grade I Tube type: Oral Tube size: 7.0 mm Number of attempts: 1 Airway Equipment and Method: Stylet and Oral airway Placement Confirmation: ETT inserted through vocal cords under direct vision,  positive ETCO2 and breath sounds checked- equal and bilateral Secured at: 21 cm Tube secured with: Tape Dental Injury: Teeth and Oropharynx as per pre-operative assessment  Comments: Smooth IV induction Massagee--- intubation AM CRNA atraumatic---   Teeth and mouth as preop  ----   Pt complains of numb upper lip that extends to the left- prior to larygnoscopy- present preop

## 2016-06-10 NOTE — Interval H&P Note (Signed)
History and Physical Interval Note:  06/10/2016 8:28 AM  Drew Prince  has presented today for surgery, with the diagnosis of Pancreatic pseudocyst   The various methods of treatment have been discussed with the patient and family. After consideration of risks, benefits and other options for treatment, the patient has consented to  Procedure(s): LAPAROSCOPIC CYST  GASTROSTOMY (N/A) as a surgical intervention .  The patient's history has been reviewed, patient examined, no change in status, stable for surgery.  I have reviewed the patient's chart and labs.  Questions were answered to the patient's satisfaction.     Marigene Ehlersamirez Jr., Jed LimerickArmando

## 2016-06-10 NOTE — Anesthesia Preprocedure Evaluation (Signed)
Anesthesia Evaluation  Patient identified by MRN, date of birth, ID band Patient awake    Reviewed: Allergy & Precautions, NPO status   History of Anesthesia Complications (+) PONV  Airway Mallampati: I  TM Distance: >3 FB Neck ROM: Full    Dental  (+) Teeth Intact   Pulmonary neg pulmonary ROS,    breath sounds clear to auscultation       Cardiovascular hypertension,  Rhythm:Regular     Neuro/Psych    GI/Hepatic Neg liver ROS, GERD  ,Hx of pancreatitis   Endo/Other  negative endocrine ROS  Renal/GU negative Renal ROS     Musculoskeletal negative musculoskeletal ROS (+)   Abdominal   Peds  Hematology negative hematology ROS (+)   Anesthesia Other Findings   Reproductive/Obstetrics                             Anesthesia Physical Anesthesia Plan  ASA: II  Anesthesia Plan: General   Post-op Pain Management:    Induction:   Airway Management Planned: Oral ETT  Additional Equipment:   Intra-op Plan:   Post-operative Plan: Extubation in OR  Informed Consent: I have reviewed the patients History and Physical, chart, labs and discussed the procedure including the risks, benefits and alternatives for the proposed anesthesia with the patient or authorized representative who has indicated his/her understanding and acceptance.   Dental advisory given  Plan Discussed with: CRNA and Surgeon  Anesthesia Plan Comments:         Anesthesia Quick Evaluation

## 2016-06-10 NOTE — Transfer of Care (Signed)
Immediate Anesthesia Transfer of Care Note  Patient: Drew Prince  Procedure(s) Performed: Procedure(s): LAPAROSCOPIC CYST GASTROSTOMY, INTRAOPERATIVE ULTRASOUND,  UPPER ENDOSCOPY  (N/A)  Patient Location: PACU  Anesthesia Type:General  Level of Consciousness: sedated  Airway & Oxygen Therapy: Patient Spontanous Breathing and Patient connected to face mask oxygen  Post-op Assessment: Report given to RN and Post -op Vital signs reviewed and stable  Post vital signs: Reviewed and stable  Last Vitals:  Filed Vitals:   06/10/16 0638 06/10/16 1135  BP: 157/101   Pulse: 93   Temp: 36.9 C 36.6 C  Resp: 18     Last Pain:  Filed Vitals:   06/10/16 1141  PainSc: 4       Patients Stated Pain Goal: 4 (06/10/16 0708)  Complications: No apparent anesthesia complications

## 2016-06-11 LAB — ABO/RH: ABO/RH(D): B POS

## 2016-06-11 LAB — CBC
HCT: 37.6 % — ABNORMAL LOW (ref 39.0–52.0)
HEMOGLOBIN: 12.5 g/dL — AB (ref 13.0–17.0)
MCH: 25.9 pg — AB (ref 26.0–34.0)
MCHC: 33.2 g/dL (ref 30.0–36.0)
MCV: 77.8 fL — AB (ref 78.0–100.0)
Platelets: 312 10*3/uL (ref 150–400)
RBC: 4.83 MIL/uL (ref 4.22–5.81)
RDW: 14 % (ref 11.5–15.5)
WBC: 14.3 10*3/uL — ABNORMAL HIGH (ref 4.0–10.5)

## 2016-06-11 LAB — TYPE AND SCREEN
ABO/RH(D): B POS
Antibody Screen: NEGATIVE

## 2016-06-11 MED ORDER — FAMOTIDINE IN NACL 20-0.9 MG/50ML-% IV SOLN
20.0000 mg | Freq: Two times a day (BID) | INTRAVENOUS | Status: DC
Start: 2016-06-11 — End: 2016-06-15
  Administered 2016-06-11 – 2016-06-14 (×8): 20 mg via INTRAVENOUS
  Filled 2016-06-11 (×9): qty 50

## 2016-06-11 MED ORDER — METOCLOPRAMIDE HCL 5 MG/ML IJ SOLN
10.0000 mg | Freq: Four times a day (QID) | INTRAMUSCULAR | Status: DC
  Administered 2016-06-11 – 2016-06-16 (×22): 10 mg via INTRAVENOUS
  Filled 2016-06-11 (×24): qty 2

## 2016-06-11 MED ORDER — PROMETHAZINE HCL 25 MG/ML IJ SOLN
12.5000 mg | INTRAMUSCULAR | Status: DC | PRN
Start: 2016-06-11 — End: 2016-06-16
  Administered 2016-06-11 (×2): 12.5 mg via INTRAVENOUS
  Filled 2016-06-11 (×4): qty 1

## 2016-06-11 MED ORDER — OCTREOTIDE ACETATE 100 MCG/ML IJ SOLN
200.0000 ug | Freq: Three times a day (TID) | INTRAMUSCULAR | Status: AC
  Administered 2016-06-11 – 2016-06-13 (×7): 200 ug via SUBCUTANEOUS
  Filled 2016-06-11: qty 2
  Filled 2016-06-11: qty 0.4
  Filled 2016-06-11: qty 2
  Filled 2016-06-11: qty 0.4
  Filled 2016-06-11: qty 2
  Filled 2016-06-11 (×2): qty 0.4
  Filled 2016-06-11: qty 2

## 2016-06-11 NOTE — Progress Notes (Signed)
1 Day Post-Op  Subjective: Pt w/ some bloody emesis last night x 2. Treated with phenergan.  Feels a little better this AM.  Objective: Vital signs in last 24 hours: Temp:  [97.8 F (36.6 C)-99.3 F (37.4 C)] 98.5 F (36.9 C) (06/15 0454) Pulse Rate:  [90-139] 139 (06/15 0454) Resp:  [10-21] 18 (06/15 0454) BP: (137-190)/(90-118) 151/93 mmHg (06/15 0454) SpO2:  [97 %-100 %] 98 % (06/15 0454) Weight:  [71.668 kg (158 lb)] 71.668 kg (158 lb) (06/14 1430) Last BM Date: 06/09/16  Intake/Output from previous day: 06/14 0701 - 06/15 0700 In: 2100 [I.V.:2100] Out: 1575 [Urine:950; Emesis/NG output:600; Blood:25] Intake/Output this shift:    General appearance: alert and cooperative Cardio: regular rate and rhythm, S1, S2 normal, no murmur, click, rub or gallop GI: approp ttp, ND  Lab Results:   Recent Labs  06/11/16 0636  WBC 14.3*  HGB 12.5*  HCT 37.6*  PLT 312    Assessment/Plan: s/p Procedure(s): LAPAROSCOPIC CYST GASTROSTOMY, INTRAOPERATIVE ULTRASOUND,  UPPER ENDOSCOPY  (N/A) Advance diet to clears today.  Go slow Mobilize Hold Lovenox Add Reglan & octreotide   LOS: 1 day    [name], Jed Limerickrmando 06/11/2016

## 2016-06-11 NOTE — Progress Notes (Signed)
   06/11/16 0110  Output (mL)  Emesis 200 mL  Emesis Characteristics  Emesis Appearance Bright Red (Dr Maisie Fushomas notified/Recived order of Phenergan)

## 2016-06-11 NOTE — Progress Notes (Signed)
   06/11/16 0455  Output (mL)  Emesis 400 mL (See order below/Dr Derrell Lollingamirez will follow-up this morning)  Emesis Characteristics  Emesis Appearance Bright Red;Other (Comment) (clots/Dr Maisie Fushomas notified.Received orders Type and Cross, CB )

## 2016-06-11 NOTE — Op Note (Addendum)
06/10/2016  PATIENT:  Drew Prince  40 y.o. male  Patient Care Team: Darreld McleanVishal Patel, MD as PCP - General (Internal Medicine)  PRE-OPERATIVE DIAGNOSIS:  Pancreatic pseudocyst   POST-OPERATIVE DIAGNOSIS:  Pancreatic pseudocyst   PROCEDURE:     UPPER ENDOSCOPY   Surgeon(s):  Karie SodaSteven Camaryn Lumbert, MD  ANESTHESIA:   general  EBL:     Delay start of Pharmacological VTE agent (>24hrs) due to surgical blood loss or risk of bleeding:  no  DRAINS: none   SPECIMEN:  No Specimen  DISPOSITION OF SPECIMEN:  N/A  COUNTS:  YES  PLAN OF CARE: Admit to inpatient   PATIENT DISPOSITION:  Intubated.  See Dr. Jacinto Halimamirez's note.  Was extubated to recovery room.  INDICATION: 40 year old male with history of severe biliary pancreatitis.  Underwent cholecystectomy.  Developed pancreatic pseudocyst in the setting of pancreatitis.  Did not completely resolve.  Seem to be more in the body and tail the pancreas.  Because of lack of resolution, pseudocyst decompression recommended by Dr. Derrell Lollingamirez.  I assisted him on that case.  See his note.  Patient also felt to benefit from endoscopy.  OR FINDINGS: Pseudocyst in the proximal body of the stomach more involving the distal body and tail of the pancreas.  Bulging through the posterior gastric wall.  Esophagogastric junction at 43 cm from incisors.  Normal lower esophageal sphincter valve without any hiatal hernia.  No evidence of gastritis or duodenitis.  Stapled pseudocyst gastrostomy done per Dr. Derrell Lollingamirez.  DESCRIPTION: The patient regarding again begun and informed consent.  Prepped and draped in a sterile fashion.  I assisted with the placement of intraperitoneal an intragastric ports.  I passed down an upper endoscope transorally with the patient supine.  Entered into the proximal esophagus easily.  Somewhat patulous fine.  Came across the lower esophageal sphincter without difficulty.  No strong evidence of gastritis.  No abnormalities or lesions.   Retroflexion revealed a normal valve.  Bulging seem to be coming off the proximal stomach on the posterior wall.  This correlated with the CT scan.  Under Derrell Lollingamirez did laparoscopic intraoperative ultrasound through the posterior gastric wall through training gastric ports.  See his note for detail.  He is able to do a stapled pseudocyst gastrostomy.  Pseudocyst was primarily replaced with necrotic pancreatic tissue and fat.  Not much in the way of fluid.  Patient was done and debridement was done to help decompress and remove necrotic tissue.  He removed the gastric ports back into intraperitoneal position.  He he closed the three puncture sites in the anterior stomach wall from the ports with laparoscopic intracorporeal suture using silk suture.  I insufflated the stomach with the endoscope with the duodenum clamped off.  Excellent stomach distention with an airtight seal.  No evidence of leak at the port site closure locations.  Therefore, I decompressed the stomach of gas and completed endoscopy.  I had captured images but printer was not working.  Please see Dr. Jacinto Halimamirez's note for further insights onto the pseudocystgastrostomy.  Ardeth SportsmanSteven C. Paxtyn Boyar, M.D., F.A.C.S. Gastrointestinal and Minimally Invasive Surgery [facility] [address] [place], [place] [id number] [phone] Main / Paging

## 2016-06-12 MED ORDER — ACETAMINOPHEN 500 MG PO TABS
1000.0000 mg | ORAL_TABLET | Freq: Three times a day (TID) | ORAL | Status: DC | PRN
  Administered 2016-06-12: 1000 mg via ORAL

## 2016-06-12 MED ORDER — ACETAMINOPHEN 10 MG/ML IV SOLN
1000.0000 mg | Freq: Four times a day (QID) | INTRAVENOUS | Status: AC
  Administered 2016-06-12 – 2016-06-13 (×4): 1000 mg via INTRAVENOUS
  Filled 2016-06-12 (×4): qty 100

## 2016-06-12 MED ORDER — METOPROLOL TARTRATE 5 MG/5ML IV SOLN
5.0000 mg | Freq: Four times a day (QID) | INTRAVENOUS | Status: DC | PRN
  Administered 2016-06-12 – 2016-06-14 (×2): 5 mg via INTRAVENOUS
  Filled 2016-06-12 (×2): qty 5

## 2016-06-12 NOTE — Progress Notes (Signed)
Pt transported to 1621 via wheelchair  w/RN and CNA

## 2016-06-12 NOTE — Progress Notes (Signed)
2 Days Post-Op  Subjective: Ambulated well yesterday Some abdominal pain with PO liquids  Objective: Vital signs in last 24 hours: Temp:  [98.4 F (36.9 C)-101.1 F (38.4 C)] 99.6 F (37.6 C) (06/16 0500) Pulse Rate:  [104-150] 119 (06/16 0500) Resp:  [18-22] 20 (06/16 0500) BP: (110-141)/(70-95) 110/70 mmHg (06/16 0500) SpO2:  [89 %-100 %] 99 % (06/16 0500) Last BM Date: 06/09/16  Intake/Output from previous day: 06/15 0701 - 06/16 0700 In: 1200 [I.V.:1200] Out: 350 [Urine:350] Intake/Output this shift:    General appearance: alert and cooperative Cardio: regular rate and rhythm, S1, S2 normal, no murmur, click, rub or gallop GI: soft, approp ttp, ND, incision c/d/i  Lab Results:   Recent Labs  06/11/16 0636  WBC 14.3*  HGB 12.5*  HCT 37.6*  PLT 312    Assessment/Plan: s/p Procedure(s): LAPAROSCOPIC CYST GASTROSTOMY, INTRAOPERATIVE ULTRASOUND,  UPPER ENDOSCOPY  (N/A) Con't clears, go slow Mobilize Will start IV ofirmev    LOS: 2 days    Drew EhlersRamirez Jr., Drew Prince 06/12/2016

## 2016-06-13 LAB — CBC
HEMATOCRIT: 29.2 % — AB (ref 39.0–52.0)
Hemoglobin: 9.9 g/dL — ABNORMAL LOW (ref 13.0–17.0)
MCH: 26.4 pg (ref 26.0–34.0)
MCHC: 33.9 g/dL (ref 30.0–36.0)
MCV: 77.9 fL — AB (ref 78.0–100.0)
Platelets: 290 10*3/uL (ref 150–400)
RBC: 3.75 MIL/uL — AB (ref 4.22–5.81)
RDW: 13.6 % (ref 11.5–15.5)
WBC: 12.2 10*3/uL — AB (ref 4.0–10.5)

## 2016-06-13 NOTE — Progress Notes (Signed)
Patient ID: Drew Prince, male   DOB: 01/07/1976, 40 y.o.   MRN: [id number] [facility] Progress Note:   3 Days Post-Op  Subjective: Mental status is clear and patient is complaining of left upper quadrant pain Objective: Vital signs in last 24 hours: Temp:  [98.9 F (37.2 C)-99.2 F (37.3 C)] 98.9 F (37.2 C) (06/17 0600) Pulse Rate:  [106-126] 114 (06/17 0600) Resp:  [14-18] 14 (06/17 0600) BP: (114-151)/(64-96) 147/90 mmHg (06/17 0600) SpO2:  [92 %-96 %] 93 % (06/17 0600)  Intake/Output from previous day: 06/16 0701 - 06/17 0700 In: 2980 [P.O.:240; I.V.:2490; IV Piggyback:250] Out: 2275 [Urine:2275] Intake/Output this shift:    Physical Exam: Work of breathing is normal.  Pain in the left upper quadrant.  Nondistended.    Lab Results:  Results for orders placed or performed during the hospital encounter of 06/10/16 (from the past 48 hour(s))  CBC     Status: Abnormal   Collection Time: 06/13/16  4:13 AM  Result Value Ref Range   WBC 12.2 (H) 4.0 - 10.5 K/uL   RBC 3.75 (L) 4.22 - 5.81 MIL/uL   Hemoglobin 9.9 (L) 13.0 - 17.0 g/dL    Comment: RESULT REPEATED AND VERIFIED DELTA CHECK NOTED    HCT 29.2 (L) 39.0 - 52.0 %   MCV 77.9 (L) 78.0 - 100.0 fL   MCH 26.4 26.0 - 34.0 pg   MCHC 33.9 30.0 - 36.0 g/dL   RDW 91.413.6 78.211.5 - 95.615.5 %   Platelets 290 150 - 400 K/uL    Radiology/Results: No results found.  Anti-infectives: Anti-infectives    Start     Dose/Rate Route Frequency Ordered Stop   06/10/16 0648  ciprofloxacin (CIPRO) IVPB 400 mg     400 mg 200 mL/hr over 60 Minutes Intravenous On call to O.R. 06/10/16 [id number] 06/10/16 0846   06/10/16 0648  metroNIDAZOLE (FLAGYL) IVPB 500 mg     500 mg 100 mL/hr over 60 Minutes Intravenous On call to O.R. 06/10/16 0648 06/10/16 0905      Assessment/Plan: Problem List: Patient Active Problem List   Diagnosis Date Noted  . Post-operative state 06/10/2016  . Disorder of bile duct stent   . Encounter for removal  of biliary stent   . Bile leak   . Generalized abdominal pain   . Pancreatic necrosis (HCC)   . Pancreatic pseudocyst   . Acute pancreatitis 01/05/2016  . Essential hypertension 01/05/2016  . Hyperlipidemia 01/05/2016    Continue observation without advancing diet today.  Will check lab in am.   3 Days Post-Op    LOS: 3 days   Matt B. Daphine DeutscherMartin, MD, [facility]  [facility] [phone] beeper [phone]  06/13/2016 8:39 AM

## 2016-06-14 LAB — CBC WITH DIFFERENTIAL/PLATELET
Basophils Absolute: 0 10*3/uL (ref 0.0–0.1)
Basophils Relative: 0 %
EOS ABS: 0.5 10*3/uL (ref 0.0–0.7)
Eosinophils Relative: 5 %
HCT: 27.9 % — ABNORMAL LOW (ref 39.0–52.0)
HEMOGLOBIN: 9.3 g/dL — AB (ref 13.0–17.0)
LYMPHS ABS: 2 10*3/uL (ref 0.7–4.0)
Lymphocytes Relative: 20 %
MCH: 26.2 pg (ref 26.0–34.0)
MCHC: 33.3 g/dL (ref 30.0–36.0)
MCV: 78.6 fL (ref 78.0–100.0)
MONOS PCT: 9 %
Monocytes Absolute: 0.9 10*3/uL (ref 0.1–1.0)
NEUTROS PCT: 66 %
Neutro Abs: 6.2 10*3/uL (ref 1.7–7.7)
Platelets: 363 10*3/uL (ref 150–400)
RBC: 3.55 MIL/uL — ABNORMAL LOW (ref 4.22–5.81)
RDW: 13.4 % (ref 11.5–15.5)
WBC: 9.6 10*3/uL (ref 4.0–10.5)

## 2016-06-14 LAB — LIPASE, BLOOD: LIPASE: 26 U/L (ref 11–51)

## 2016-06-14 MED ORDER — METHOCARBAMOL 500 MG PO TABS
1000.0000 mg | ORAL_TABLET | Freq: Four times a day (QID) | ORAL | Status: DC | PRN
  Administered 2016-06-14 – 2016-06-15 (×2): 1000 mg via ORAL
  Filled 2016-06-14 (×2): qty 2

## 2016-06-14 NOTE — Progress Notes (Signed)
Patient ID: Drew Prince, male   DOB: Jan 14, 1976, 40 y.o.   MRN: [id number] [facility] Progress Note:   4 Days Post-Op  Subjective: Mental status is clear.  Patient complaining of post prandial (clear liquid) pain int he left upper quadrant.  Objective: Vital signs in last 24 hours: Temp:  [98.3 F (36.8 C)-98.8 F (37.1 C)] 98.3 F (36.8 C) (06/18 0532) Pulse Rate:  [103-120] 103 (06/18 0532) Resp:  [16-18] 18 (06/18 0532) BP: (140-146)/(88-96) 146/88 mmHg (06/18 0532) SpO2:  [94 %-97 %] 94 % (06/18 0532)  Intake/Output from previous day: 06/17 0701 - 06/18 0700 In: 1960 [P.O.:660; I.V.:1200; IV Piggyback:100] Out: 2050 [Urine:2050] Intake/Output this shift: Total I/O In: -  Out: 425 [Urine:425]  Physical Exam: Work of breathing is normal.  Pain in left upper quadrant but no peritoneal signs.  Usual soreness  Lab Results:  Results for orders placed or performed during the hospital encounter of 06/10/16 (from the past 48 hour(s))  CBC     Status: Abnormal   Collection Time: 06/13/16  4:13 AM  Result Value Ref Range   WBC 12.2 (H) 4.0 - 10.5 K/uL   RBC 3.75 (L) 4.22 - 5.81 MIL/uL   Hemoglobin 9.9 (L) 13.0 - 17.0 g/dL    Comment: RESULT REPEATED AND VERIFIED DELTA CHECK NOTED    HCT 29.2 (L) 39.0 - 52.0 %   MCV 77.9 (L) 78.0 - 100.0 fL   MCH 26.4 26.0 - 34.0 pg   MCHC 33.9 30.0 - 36.0 g/dL   RDW 40.9 81.1 - 91.4 %   Platelets 290 150 - 400 K/uL  CBC with Differential/Platelet     Status: Abnormal   Collection Time: 06/14/16  4:02 AM  Result Value Ref Range   WBC 9.6 4.0 - 10.5 K/uL   RBC 3.55 (L) 4.22 - 5.81 MIL/uL   Hemoglobin 9.3 (L) 13.0 - 17.0 g/dL   HCT 78.2 (L) 95.6 - 21.3 %   MCV 78.6 78.0 - 100.0 fL   MCH 26.2 26.0 - 34.0 pg   MCHC 33.3 30.0 - 36.0 g/dL   RDW 08.6 57.8 - 46.9 %   Platelets 363 150 - 400 K/uL   Neutrophils Relative % 66 %   Neutro Abs 6.2 1.7 - 7.7 K/uL   Lymphocytes Relative 20 %   Lymphs Abs 2.0 0.7 - 4.0 K/uL   Monocytes Relative 9 %   Monocytes Absolute 0.9 0.1 - 1.0 K/uL   Eosinophils Relative 5 %   Eosinophils Absolute 0.5 0.0 - 0.7 K/uL   Basophils Relative 0 %   Basophils Absolute 0.0 0.0 - 0.1 K/uL  Lipase, blood     Status: None   Collection Time: 06/14/16  4:02 AM  Result Value Ref Range   Lipase 26 11 - 51 U/L    Radiology/Results: No results found.  Anti-infectives: Anti-infectives    Start     Dose/Rate Route Frequency Ordered Stop   06/10/16 0648  ciprofloxacin (CIPRO) IVPB 400 mg     400 mg 200 mL/hr over 60 Minutes Intravenous On call to O.R. 06/10/16 [id number] 06/10/16 0846   06/10/16 0648  metroNIDAZOLE (FLAGYL) IVPB 500 mg     500 mg 100 mL/hr over 60 Minutes Intravenous On call to O.R. 06/10/16 0648 06/10/16 0905      Assessment/Plan: Problem List: Patient Active Problem List   Diagnosis Date Noted  . Post-operative state 06/10/2016  . Disorder of bile duct stent   . Encounter for removal  of biliary stent   . Bile leak   . Generalized abdominal pain   . Pancreatic necrosis (HCC)   . Pancreatic pseudocyst   . Acute pancreatitis 01/05/2016  . Essential hypertension 01/05/2016  . Hyperlipidemia 01/05/2016    Lipase normal and WBC is back to normal.  Will keep on clears for now.   4 Days Post-Op    LOS: 4 days   Matt B. Daphine DeutscherMartin, MD, [facility] SurgeryF[facility]  [facility] [phone] beeper [phone]  06/14/2016 8:39 AM

## 2016-06-14 NOTE — Progress Notes (Signed)
Dr. Johna SheriffHoxworth aware via phone of pt's c/o spasms in LUQ. MD aware pt walking nor analgesics helping. See new order received.

## 2016-06-15 MED ORDER — MAGNESIUM HYDROXIDE 400 MG/5ML PO SUSP: 30.0000 mL | Freq: Every day | ORAL | Status: DC | PRN

## 2016-06-15 MED ORDER — FAMOTIDINE 20 MG PO TABS
20.0000 mg | ORAL_TABLET | Freq: Two times a day (BID) | ORAL | Status: DC
  Administered 2016-06-15 – 2016-06-16 (×3): 20 mg via ORAL
  Filled 2016-06-15 (×3): qty 1

## 2016-06-15 NOTE — Progress Notes (Signed)
PHARMACIST - PHYSICIAN COMMUNICATION CONCERNING: IV to Oral Route Change Policy  RECOMMENDATION: This patient is receiving Pepcid by the intravenous route.  Based on criteria approved by the Pharmacy and Therapeutics Committee, the intravenous medication(s) is/are being converted to the equivalent oral dose form(s).   DESCRIPTION: These criteria include:  The patient is eating (either orally or via tube) and/or has been taking other orally administered medications for a least 24 hours  The patient has no evidence of active gastrointestinal bleeding or impaired GI absorption (gastrectomy, short bowel, patient on TNA or NPO).  If you have questions about this conversion, please contact the Pharmacy Department  []   [phone]( [phone] )  [facility] []   [phone]( [phone] )  [facility] []   [phone]  [facility] []   [phone]  [facility] [x]   [phone]  [facility]   Clance BollRunyon, Kamerin Axford, [facility] 06/15/2016 7:56 AM

## 2016-06-15 NOTE — Progress Notes (Signed)
5 Days Post-Op  Subjective: Pt with some con't LUQ pain with CLD and ambulation No BM  Objective: Vital signs in last 24 hours: Temp:  [98.1 F (36.7 C)-98.8 F (37.1 C)] 98.1 F (36.7 C) (06/19 0526) Pulse Rate:  [98-103] 98 (06/19 0526) Resp:  [16-18] 16 (06/19 0526) BP: (142-155)/(86-90) 155/90 mmHg (06/19 0526) SpO2:  [93 %-98 %] 96 % (06/19 0526) Last BM Date: 06/09/16  Intake/Output from previous day: 06/18 0701 - 06/19 0700 In: 3328.3 [P.O.:955; I.V.:2373.3] Out: 4500 [Urine:4500] Intake/Output this shift:    General appearance: alert and cooperative GI: soft, non-tender; bowel sounds normal; no masses,  no organomegaly and incision c/d/i  Lab Results:   Recent Labs  06/13/16 0413 06/14/16 0402  WBC 12.2* 9.6  HGB 9.9* 9.3*  HCT 29.2* 27.9*  PLT 290 363    Anti-infectives: Anti-infectives    Start     Dose/Rate Route Frequency Ordered Stop   06/10/16 0648  ciprofloxacin (CIPRO) IVPB 400 mg     400 mg 200 mL/hr over 60 Minutes Intravenous On call to O.R. 06/10/16 [id number] 06/10/16 0846   06/10/16 0648  metroNIDAZOLE (FLAGYL) IVPB 500 mg     500 mg 100 mL/hr over 60 Minutes Intravenous On call to O.R. 06/10/16 [id number] 06/10/16 0905      Assessment/Plan: s/p Procedure(s): LAPAROSCOPIC CYST GASTROSTOMY, INTRAOPERATIVE ULTRASOUND,  UPPER ENDOSCOPY  (N/A) Adv diet to soft Mobilize MOM for BM  LOS: 5 days    Marigene Ehlersamirez Jr., [facility] 06/15/2016

## 2016-06-16 MED ORDER — OXYCODONE HCL 5 MG PO TABS: 5.0000 mg | ORAL_TABLET | ORAL | Status: DC | PRN

## 2016-06-16 MED ORDER — MAGNESIUM HYDROXIDE 400 MG/5ML PO SUSP
30.0000 mL | ORAL | Status: AC
Start: 2016-06-16 — End: 2016-06-16
  Administered 2016-06-16: 30 mL via ORAL
  Filled 2016-06-16: qty 30

## 2016-06-16 NOTE — Progress Notes (Signed)
6 Days Post-Op  Subjective: Pt doing well. No BM Still with some LUQ pain  Objective: Vital signs in last 24 hours: Temp:  [98.4 F (36.9 C)-98.8 F (37.1 C)] 98.4 F (36.9 C) (06/20 0442) Pulse Rate:  [97-109] 97 (06/20 0442) Resp:  [16-18] 16 (06/20 0442) BP: (137-160)/(83-101) 137/83 mmHg (06/20 0442) SpO2:  [97 %-99 %] 97 % (06/20 0442) Last BM Date: 06/09/16  Intake/Output from previous day: 06/19 0701 - 06/20 0700 In: 3660 [P.O.:1260; I.V.:2400] Out: 2925 [Urine:2925] Intake/Output this shift:    General appearance: alert and cooperative GI: soft, non-tender; bowel sounds normal; no masses,  no organomegaly  Lab Results:   Recent Labs  06/14/16 0402  WBC 9.6  HGB 9.3*  HCT 27.9*  PLT 363   Anti-infectives: Anti-infectives    Start     Dose/Rate Route Frequency Ordered Stop   06/10/16 0648  ciprofloxacin (CIPRO) IVPB 400 mg     400 mg 200 mL/hr over 60 Minutes Intravenous On call to O.R. 06/10/16 [id number] 06/10/16 0846   06/10/16 0648  metroNIDAZOLE (FLAGYL) IVPB 500 mg     500 mg 100 mL/hr over 60 Minutes Intravenous On call to O.R. 06/10/16 [id number] 06/10/16 0905      Assessment/Plan: s/p Procedure(s): LAPAROSCOPIC CYST GASTROSTOMY, INTRAOPERATIVE ULTRASOUND,  UPPER ENDOSCOPY  (N/A) Advance diet to low fat MOM for BM Hopefully home later today if has BM.  LOS: 6 days    Drew Ehlersamirez Jr., Drew Prince 06/16/2016

## 2016-06-18 NOTE — Discharge Summary (Signed)
Physician Discharge Summary  Patient ID: Drew FishRiley Prince MRN: [id number] DOB/AGE: December 24, 1976 40 y.o.  Admit date: 06/10/2016 Discharge date: 06/18/2016  Admission Diagnoses:status post transgastric cyst gastrostomy  Discharge Diagnoses:  Active Problems:   Post-operative state   Discharged Condition: good  Hospital Course: patient is a 40 year old male who had a previous history ofBilly pancreatitis.  Patient was status post laparoscopic cholecystectomy with bile leak.  Patientcontinued with apancreatic pseudocyst.  He was seen in clinic.  Secondary to the consistent pseudocyst we proceeded with cyst gastrostomy.  Patient will hospital.  Patient did have complaints o upper quadrant pagood bowel fun was deemed stable for discharge and discharged home.  Consults: None  Significant Diagnostic Studies: none  Treatments: surgery: as above  Discharge Exam: Blood pressure 151/99, pulse 99, temperature 98.1 F (36.7 C), temperature source Axillary, resp. rate 17, height 5\' 4"  (1.626 m), weight 71.668 kg (158 lb), SpO2 99 %. General appearance: alert and cooperative Cardio: regular rate and rhythm, S1, S2 normal, no murmur, click, rub or gallop GI: soft, non-tender; bowel sounds normal; no masses,  no organomegaly and incision c/d/i  Disposition: 01-Home or Self Care  Discharge Instructions    Diet - low sodium heart healthy    Complete by:  As directed      Increase activity slowly    Complete by:  As directed             Medication List    TAKE these medications        acetaminophen 500 MG tablet  Commonly known as:  TYLENOL  Take 1,000 mg by mouth every 6 (six) hours as needed (For pain.).     cetirizine 10 MG tablet  Commonly known as:  ZYRTEC  Take 10 mg by mouth every evening.     diphenhydrAMINE 25 MG tablet  Commonly known as:  BENADRYL  Take 25 mg by mouth every evening.     ondansetron 4 MG disintegrating tablet  Commonly known as:  ZOFRAN-ODT  Take 1-2  tablets (4-8 mg total) by mouth every 8 (eight) hours as needed for nausea or vomiting.     Oxycodone HCl 10 MG Tabs  Take 10 mg by mouth every 6 (six) hours as needed (For pain.).     oxyCODONE 5 MG immediate release tablet  Commonly known as:  Oxy IR/ROXICODONE  Take 1-2 tablets (5-10 mg total) by mouth every 4 (four) hours as needed for moderate pain.     pantoprazole 40 MG tablet  Commonly known as:  PROTONIX  Take 1 tablet (40 mg total) by mouth every evening.     senna-docusate 8.6-50 MG tablet  Commonly known as:  Senokot-S  Take 1 tablet by mouth daily.           Follow-up Information    Follow up with Lajean Saveramirez Jr., Pamala Hayman, MD. Schedule an appointment as soon as possible for a visit in 2 weeks.   Specialty:  General Surgery   Why:  For wound re-check   Contact information:   [address] [place] [id number] [phone]       Signed: [name], Jed LimerickArmando 06/18/2016, 12:09 PM

## 2016-07-01 ENCOUNTER — Ambulatory Visit (INDEPENDENT_AMBULATORY_CARE_PROVIDER_SITE_OTHER): Payer: BLUE CROSS/BLUE SHIELD | Admitting: Internal Medicine

## 2016-07-01 ENCOUNTER — Encounter: Payer: Self-pay | Admitting: Internal Medicine

## 2016-07-01 VITALS — BP 130/86 | HR 96 | Temp 98.2°F | Ht 64.0 in | Wt 157.4 lb

## 2016-07-01 DIAGNOSIS — K863 Pseudocyst of pancreas: Secondary | ICD-10-CM | POA: Diagnosis not present

## 2016-07-01 DIAGNOSIS — Z931 Gastrostomy status: Secondary | ICD-10-CM | POA: Diagnosis not present

## 2016-07-01 DIAGNOSIS — I10 Essential (primary) hypertension: Secondary | ICD-10-CM

## 2016-07-01 DIAGNOSIS — K219 Gastro-esophageal reflux disease without esophagitis: Secondary | ICD-10-CM | POA: Insufficient documentation

## 2016-07-01 MED ORDER — PANTOPRAZOLE SODIUM 40 MG PO TBEC: 40.0000 mg | DELAYED_RELEASE_TABLET | Freq: Every evening | ORAL | Status: DC

## 2016-07-01 NOTE — Assessment & Plan Note (Signed)
BP Readings from Last 3 Encounters:  07/01/16 130/86  06/16/16 151/99  06/05/16 169/96   A/P: BP well controlled today at 130/86. Had elevated blood pressures during recent hospital admission after his cyst gastrostomy likely 2/2 to pain and post-op changes. Not currently on medications for blood pressure as prior Lisinopril was discontinued as possible culprit for his pancreatitis. -Continue to monitor off medications -Patient will check BP at pharmacy once in a while to monitor outside of clinic

## 2016-07-01 NOTE — Patient Instructions (Signed)
It was a pleasure to meet you [name].  Please follow up with your surgeon as scheduled and with your gastroenterologist. I think getting the CT scan is a good idea as well.  Your blood pressure was good today at 130/86. We will not start you back on blood pressure medications at this time and continue to monitor. Please try to check your blood pressure once in a while at the pharmacy and keep a log of your readings.  I will refill your Protonix (pantoprazole) today.  Please let us know if you need anything else.

## 2016-07-01 NOTE — Progress Notes (Signed)
Patient ID: Drew Prince, male   DOB: 04-May-1976, 40 y.o.   MRN: [id number]   CC: Left abdominal pain and needs refill HPI: [name] is a 40 y.o. male with PMH as listed below who presents for follow up of his HTN and medication refill. Please see problem based charting for status of patient's chronic medical issues.  Patient with history of severe biliary pancreatitis with necrosis s/p lap chole on 3/817 with post-op bile leak. He developed a pancreatic pseudocyst which did not resolve after several months and went for laparoscopic cyst gastrostomy on 06/10/16. He still has some pressure-like left sided abdominal pain beneath his ribcage which is nearly constant and worsened when eating large meals and deep inspiration. He continues to follow with surgery with last visit last week. They plan on obtaining a CT scan for further evaluation of his ongoing abdominal pain. Patient otherwise feels improvement and denies any recent nausea, vomiting, diarrhea, constipation, or fevers. He is able to tolerate small portion meals and tries to avoid fatty foods. He reports occasional alcohol use. His current pain regiment consists of Oxycodone 10 mg q6h prn and Dilaudid 2 mg qhs.  Patient was previously on an Ace-I (lisinopril) for hypertension which was discontinued during a recent hospitalization for possible cause of pancreatitis (along with Lipitor and fenofibrate). He currently is not on any blood pressure medications.  He is taking Pantoprazole 40 mg daily for reflux and requests a refill for this.    Past Medical History  Diagnosis Date  . Hypercholesteremia   . H/O seasonal allergies     OTC med used  . Depression   . GERD (gastroesophageal reflux disease)   . Acute pancreatitis     1-8-17to 02-03-16 [facility].  . Pancreatic necrosis (HCC)   . Pancreatic pseudocyst   . Bile leak   . Hypertension     01-05-16 to 02-03-16 Hospitalized for pancreatitis, B/P med discontinued  during that time.  Marland Kitchen. PONV (postoperative nausea and vomiting) 03-04-16    ponv after cholecystectomy   Review of Systems: Review of Systems  Constitutional: Negative for fever and chills.  Respiratory: Negative for shortness of breath.   Cardiovascular: Negative for chest pain.  Gastrointestinal: Positive for abdominal pain. Negative for nausea, vomiting, diarrhea, constipation and blood in stool.  Genitourinary: Negative for dysuria and hematuria.    Physical Exam: Filed Vitals:   07/01/16 1525  BP: 130/86  Pulse: 96  Temp: 98.2 F (36.8 C)  TempSrc: Oral  Height: 5\' 4"  (1.626 m)  Weight: 157 lb 6.4 oz (71.396 kg)  SpO2: 100%   Physical Exam  Constitutional: He is oriented to person, place, and time. He appears well-developed and well-nourished. No distress.  HENT:  Head: Normocephalic and atraumatic.  Eyes: No scleral icterus.  Cardiovascular: Regular rhythm.   No murmur heard. Borderline tachycardia  Pulmonary/Chest: Effort normal. No respiratory distress. He has no wheezes. He has no rales.  Abdominal: Soft. He exhibits no distension and no mass. There is no guarding.  Generalized tenderness to light palpation. Surgical scars well healed.  Musculoskeletal: He exhibits no edema.  Neurological: He is alert and oriented to person, place, and time.  Skin: Skin is warm. He is not diaphoretic.  Psychiatric: He has a normal mood and affect.    Assessment & Plan:  See encounters tab for problem based medical decision making. Patient discussed with Dr. Heide SparkNarendra

## 2016-07-01 NOTE — Assessment & Plan Note (Signed)
A/P: s/p cyst gastrostomy on 06/10/16. Continues to follow with Dr. Derrell Lollingamirez of surgery. Has continued LUQ abdominal pain worsened with large meals, but overall has notable improvement in symptoms. He followed up with Surgery last week who plan on CT imaging to further evaluate his pain. Dilaudid 2 mg qhs was added to his pain control regimen by surgery in addition to Oxycodone 10 mg q6h prn. He is currently well-appearing, afebrile, without nausea, vomiting, or limitations in mobility. He reports tolerating small sized meals and good bowel movements. Current pain is unclear in etiology however may be related to resolving pancreatic issues or post-op changes. He understands dietary changes to avoid fatty foods and I advised him to avoid alcohol as well. -Continue to follow up with Surgery and planned CT scan -Counseled on alcohol cessation -f/u with Dr. Leone PayorGessner of GI as needed

## 2016-07-01 NOTE — Assessment & Plan Note (Signed)
A/P: Takes Pantoprazole 40 mg daily, reports benefit from use and requesting refill. -Refill Pantoprazole 40 mg daily

## 2016-07-02 NOTE — Progress Notes (Signed)
Internal Medicine Clinic Attending  Case discussed with Dr. Patel,Vishal at the time of the visit.  We reviewed the resident's history and exam and pertinent patient test results.  I agree with the assessment, diagnosis, and plan of care documented in the resident's note.  

## 2016-07-09 ENCOUNTER — Other Ambulatory Visit: Payer: Self-pay | Admitting: General Surgery

## 2016-07-09 DIAGNOSIS — K863 Pseudocyst of pancreas: Secondary | ICD-10-CM

## 2016-07-15 ENCOUNTER — Inpatient Hospital Stay: Admission: RE | Admit: 2016-07-15 | Payer: BLUE CROSS/BLUE SHIELD | Source: Ambulatory Visit

## 2016-07-16 ENCOUNTER — Other Ambulatory Visit: Payer: Self-pay | Admitting: General Surgery

## 2016-07-16 DIAGNOSIS — Z931 Gastrostomy status: Secondary | ICD-10-CM

## 2016-07-20 ENCOUNTER — Ambulatory Visit
Admission: RE | Admit: 2016-07-20 | Discharge: 2016-07-20 | Disposition: A | Discharge status: Home / Self Care | Payer: BLUE CROSS/BLUE SHIELD | Source: Ambulatory Visit | Attending: General Surgery | Admitting: General Surgery

## 2016-07-20 DIAGNOSIS — Z931 Gastrostomy status: Secondary | ICD-10-CM

## 2016-07-20 MED ORDER — IOPAMIDOL (ISOVUE-300) INJECTION 61%
100.0000 mL | Freq: Once | INTRAVENOUS | Status: AC | PRN
  Administered 2016-07-20: 100 mL via INTRAVENOUS

## 2016-08-10 ENCOUNTER — Other Ambulatory Visit: Payer: Self-pay | Admitting: Internal Medicine

## 2016-08-10 ENCOUNTER — Encounter: Payer: Self-pay | Admitting: Internal Medicine

## 2016-08-10 MED ORDER — PANTOPRAZOLE SODIUM 40 MG PO TBEC: 40.0000 mg | DELAYED_RELEASE_TABLET | Freq: Every evening | ORAL | 1 refills | Status: DC

## 2016-11-13 IMAGING — CR DG ABD PORTABLE 1V
1 series · 1 of 1 positions shown · non-contrast
Comparison: MRI/MRA of the abdomen performed 01/16/2016

CLINICAL DATA: Acute onset of vomiting.  Initial encounter.

EXAM:
PORTABLE ABDOMEN - 1 VIEW

[AP]
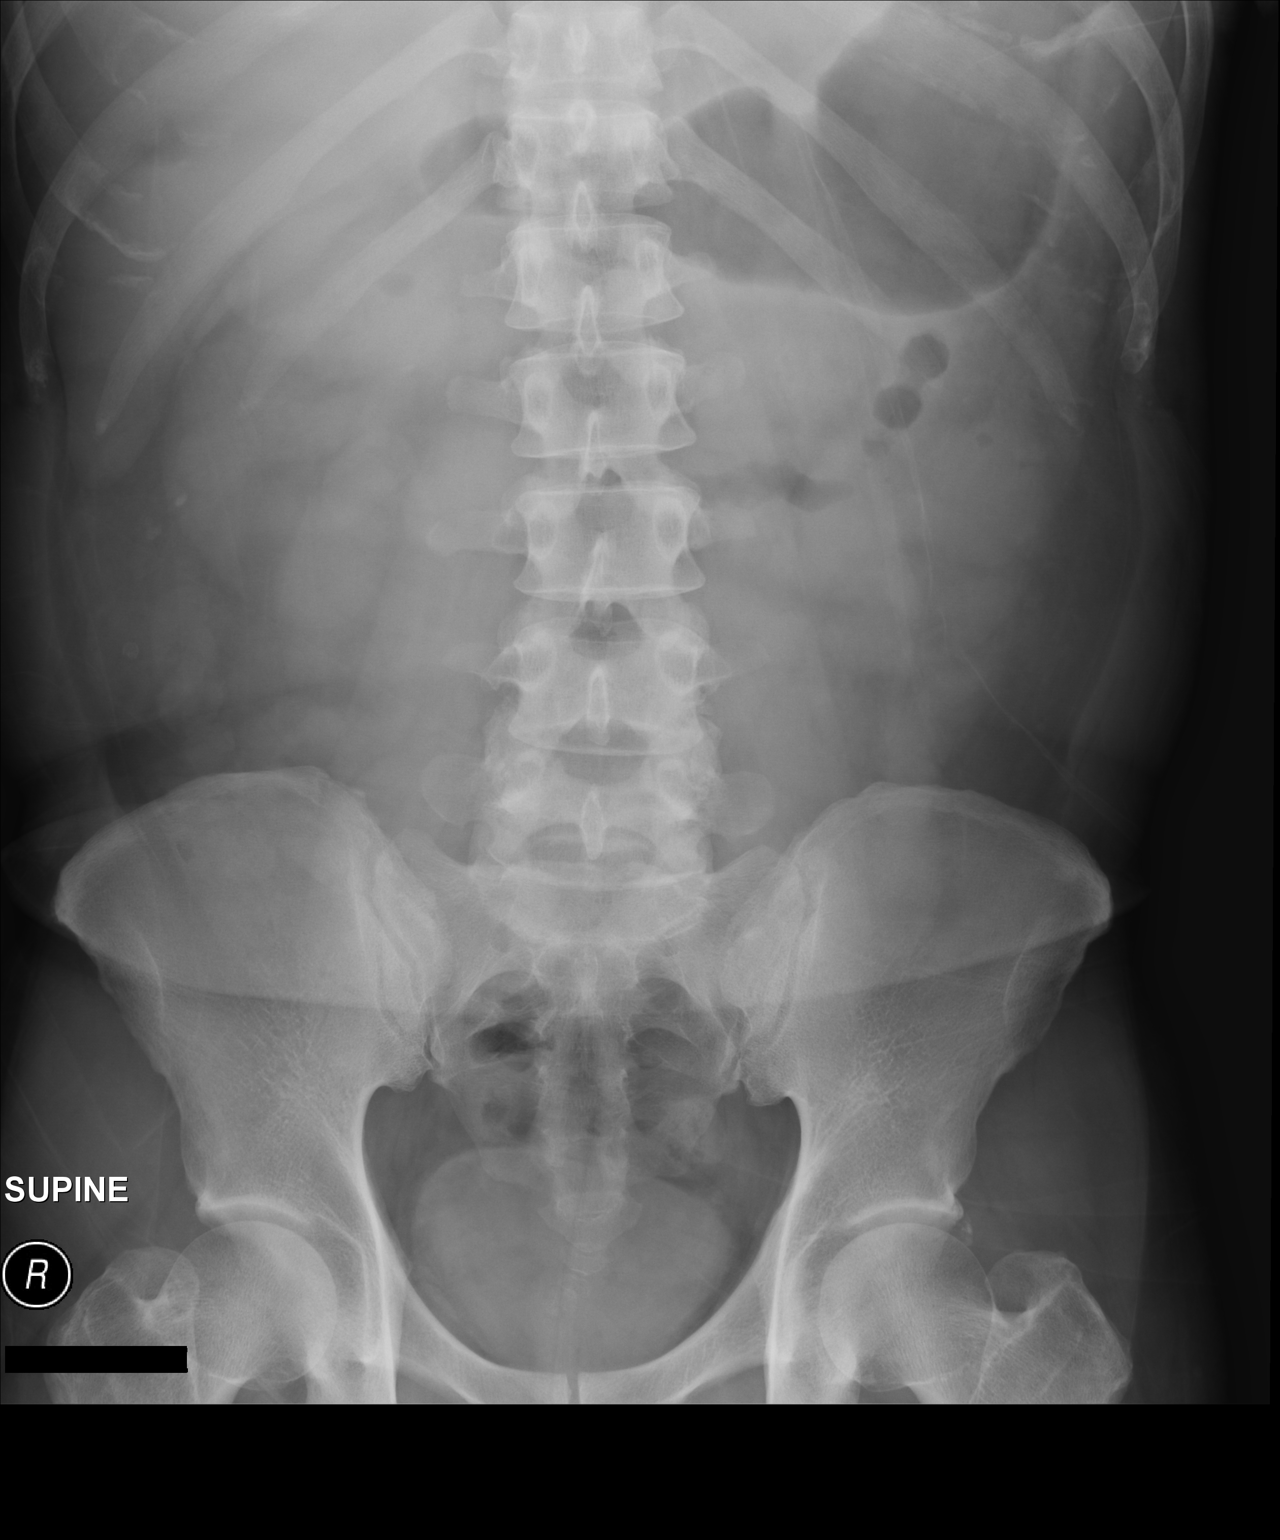

[1 of 1 positions shown; findings below may reference images not displayed]

FINDINGS: The visualized bowel gas pattern is unremarkable. Scattered air and
stool filled loops of colon are seen; no abnormal dilatation of
small bowel loops is seen to suggest small bowel obstruction. No
free intra-abdominal air is identified, though evaluation for free
air is limited on a single supine view.

A small to moderate amount of air is noted within the stomach.

The visualized osseous structures are within normal limits; the
sacroiliac joints are unremarkable in appearance.
IMPRESSION: Unremarkable bowel gas pattern; no free intra-abdominal air seen.

## 2016-11-15 IMAGING — CT CT ABD-PELV W/ CM
2 of 5 series · 9 of 46 positions shown, 10 images · IV contrast (omnipaque)
Comparison: 01/05/2016.  MRI from 01/16/2016.

CLINICAL DATA: Left lower quadrant abdominal pain in a patient with
pancreatitis, pancreatic necrosis, and pseudocyst formation.

EXAM:
CT ABDOMEN AND PELVIS WITH CONTRAST
TECHNIQUE: Multidetector CT imaging of the abdomen and pelvis was performed
using the standard protocol following bolus administration of
intravenous contrast.
CONTRAST:  100mL OMNIPAQUE IOHEXOL 300 MG/ML  SOLN

[Series 201: routine, idose (2) · axial · 0.78mm/px · z∈[-687,-307]mm · 6 of 98 slices shown, 7 images]
[im 11/98  soft-tissue]
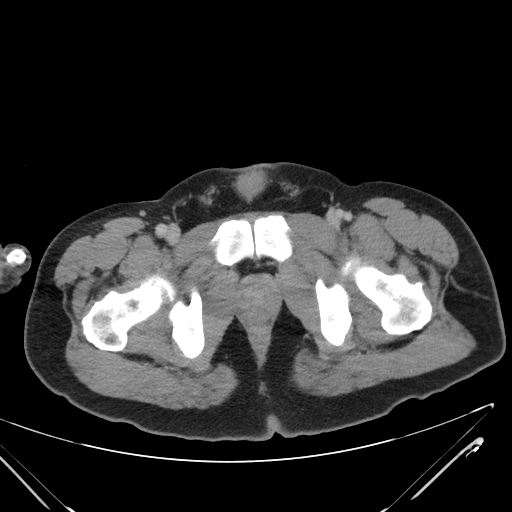
[im 11/98  bone]
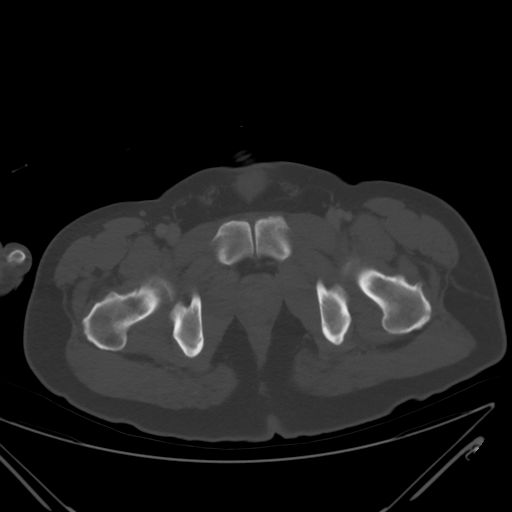
[im 26/98  soft-tissue]
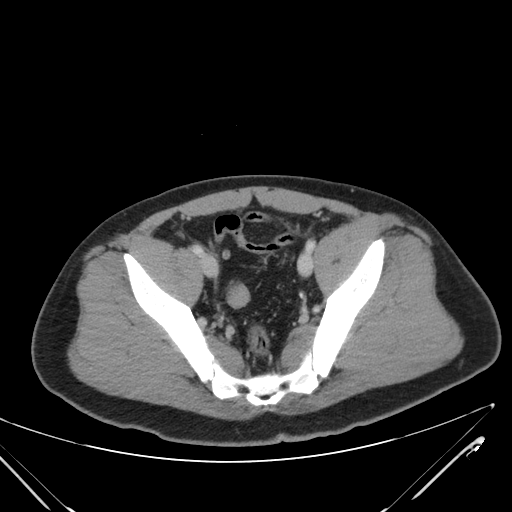
[im 41/98  soft-tissue]
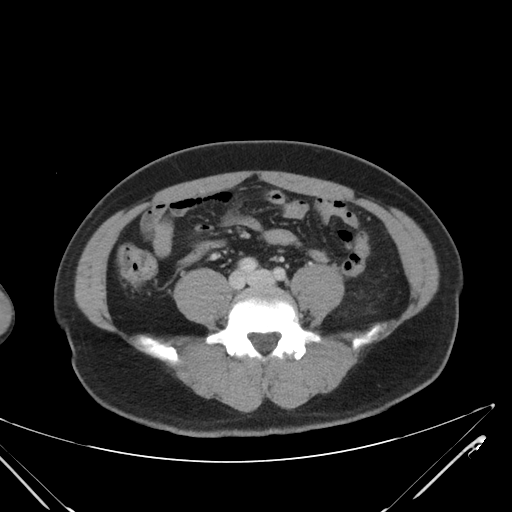
[im 57/98  soft-tissue]
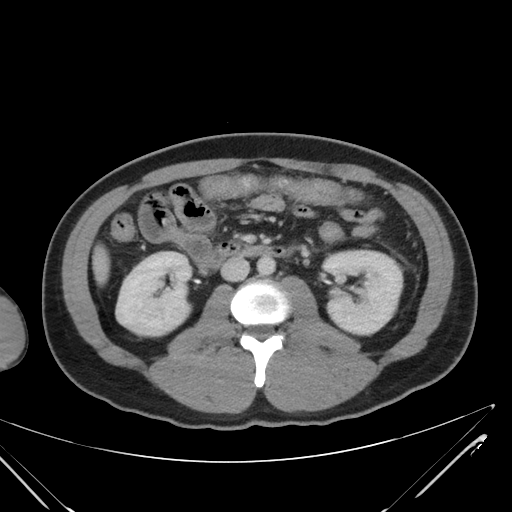
[im 72/98  soft-tissue]
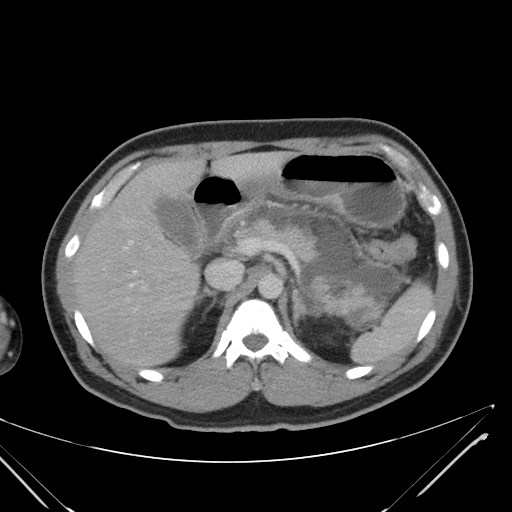
[im 87/98  soft-tissue]
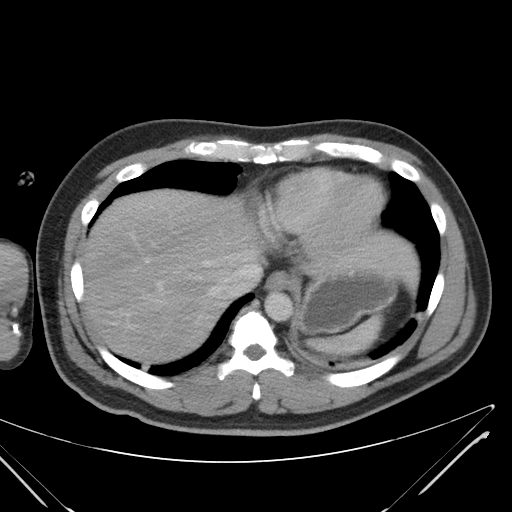

[Series 203: coronals, idose (2) · coronal · 0.45mm/px · 3 of 110 slices shown]
[im 37/110  soft-tissue]
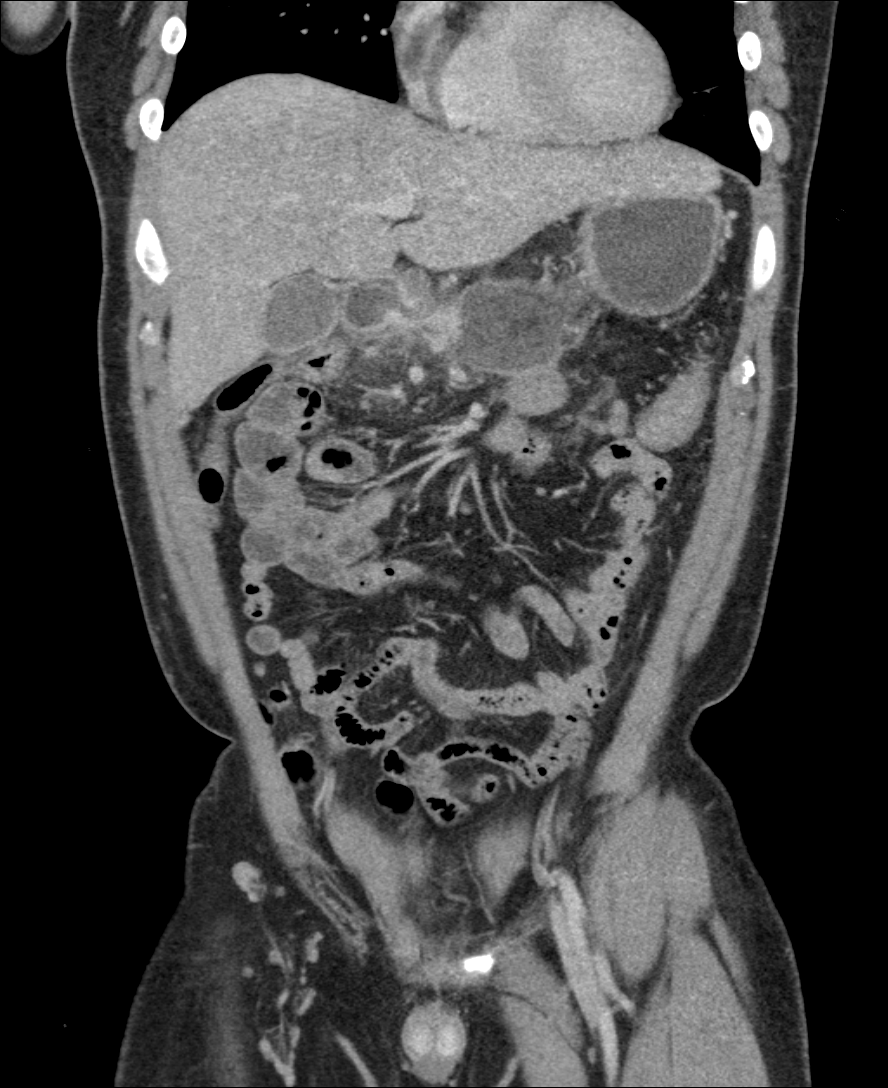
[im 49/110  soft-tissue]
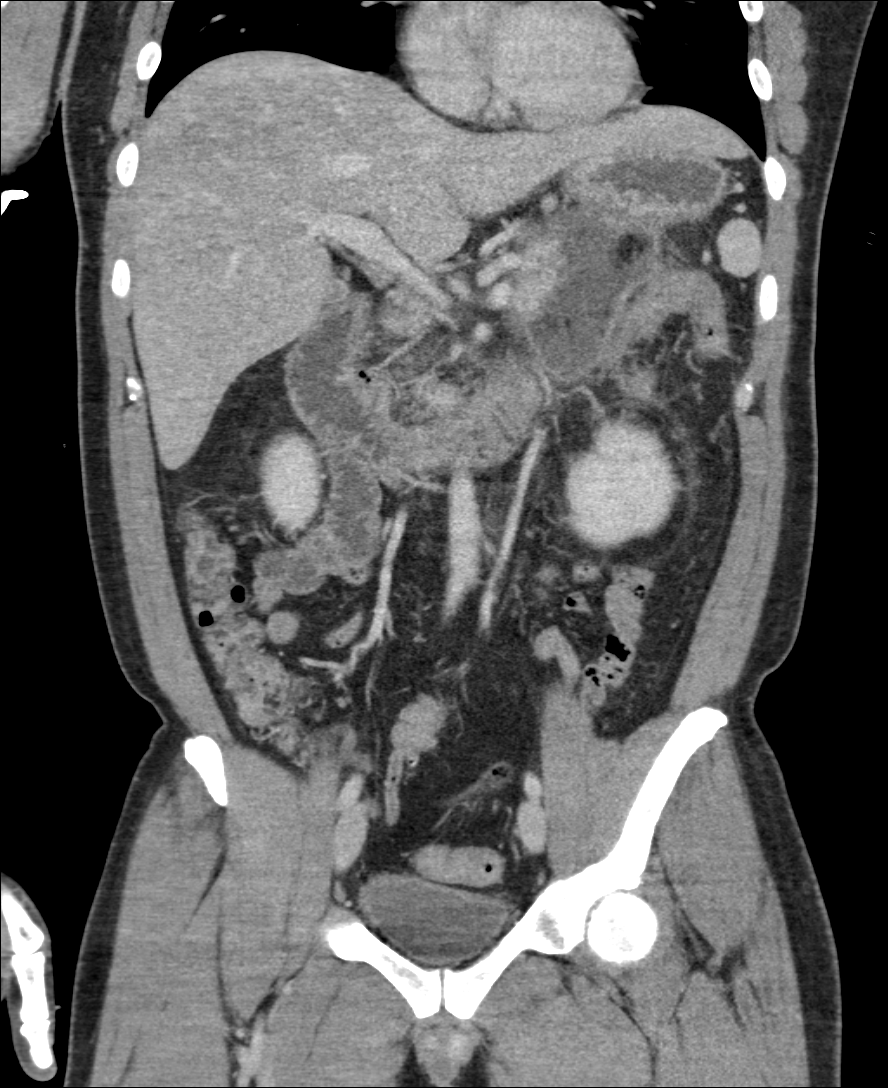
[im 61/110  soft-tissue]
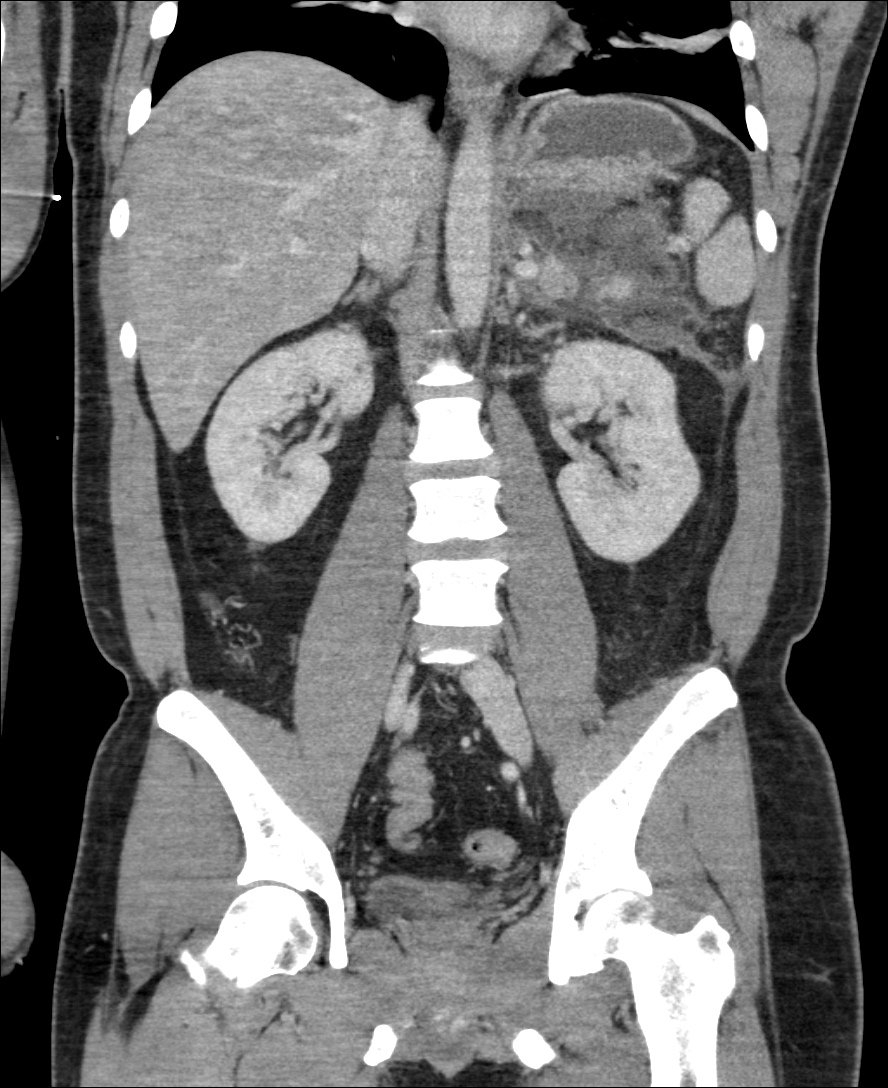

[9 of 46 positions shown; findings below may reference images not displayed]

FINDINGS: Lower chest: Subsegmental atelectasis noted in the lung bases, left
greater than right.

Hepatobiliary: No focal abnormality within the liver parenchyma.
There is no evidence for gallstones, gallbladder wall thickening, or
pericholecystic fluid. No intrahepatic or extrahepatic biliary
dilation.

Pancreas: Multiple fluid collections are identified in and adjacent
to the pancreas. 14 mm new fluid collection is identified in the
head of the pancreas (image 34 series 2). An amorphous fluid
collection is identified anterior to and superior to the pancreatic
body and tail. This is approximately 12.0 x 4.9 x 7.2 cm in size.
Inferiorly, there is a thin developing wall evident along the fluid,
but superiorly as the fluid tracks posterior to the stomach and
medial to the spleen, there is no organized wall associated no gas
within this collection. There is marked thinning of the pancreatic
parenchyma region of the tail in some areas of pancreatic necrosis
are suspected.

Spleen: No splenomegaly. No focal mass lesion.

Adrenals/Urinary Tract: No adrenal nodule or mass. Early excretion
of contrast noted in the kidneys bilaterally. Renal appearance is
otherwise unremarkable. No evidence for hydroureter. The urinary
bladder appears normal for the degree of distention.

Stomach/Bowel: There is minimal wall thickening posterior gastric
fundus, likely secondary to the pancreatic process. Stomach
otherwise unremarkable. Duodenum is normally positioned as is the
ligament of Treitz. No small bowel wall thickening. No small bowel
dilatation. Insert normal terminal only on The appendix is normal.
Colon is decompressed.

Vascular/Lymphatic: No abdominal aortic aneurysm. Portal vein is
patent. Superior mesenteric vein and splenic vein are patent
although there is some attenuation of the mid splenic vein. Celiac
axis and superior mesenteric artery are patent. No evidence for
pseudoaneurysm and No abdominal atherosclerotic calcification. There
is no gastrohepatic or hepatoduodenal ligament lymphadenopathy. No
intraperitoneal or retroperitoneal lymphadenopathy. No pelvic
sidewall lymphadenopathy.

Reproductive: The prostate gland and seminal vesicles have normal
imaging features.

Other: Insert no peritoneal free fluid.

Musculoskeletal: Bone windows reveal no worrisome lytic or sclerotic
osseous lesions.
IMPRESSION: Similar appearance in the retroperitoneal fluid associated with
pancreatic body and tail comparing to the MRI. Collection shows some
peripheral organization inferiorly, but the wall along the cranial
portion of the collection is not as well-defined. Imaging features
are compatible with an evolving pseudocyst. There is no gas within
the collection suggest superinfection although superinfection cannot
be excluded by CT imaging.

There is a small fluid collection in the head of the pancreas.

Areas of apparent pancreatic necrosis involving the tail.

Gallstones visualized on the previous MRI are not apparent by CT.

## 2016-11-16 ENCOUNTER — Encounter: Payer: Self-pay | Admitting: Internal Medicine

## 2016-11-25 ENCOUNTER — Telehealth: Payer: Self-pay | Admitting: Internal Medicine

## 2016-11-25 NOTE — Telephone Encounter (Signed)
APT. REMINDER CALL, LMTCB °

## 2016-11-26 ENCOUNTER — Encounter: Payer: Self-pay | Admitting: Internal Medicine

## 2016-11-26 ENCOUNTER — Ambulatory Visit (HOSPITAL_COMMUNITY)
Admission: RE | Admit: 2016-11-26 | Discharge: 2016-11-26 | Disposition: A | Discharge status: Home / Self Care | Payer: BC Managed Care – PPO | Source: Ambulatory Visit | Attending: Internal Medicine | Admitting: Internal Medicine

## 2016-11-26 ENCOUNTER — Ambulatory Visit (INDEPENDENT_AMBULATORY_CARE_PROVIDER_SITE_OTHER): Payer: BC Managed Care – PPO | Admitting: Internal Medicine

## 2016-11-26 VITALS — BP 140/101 | HR 96 | Temp 98.5°F | Ht 64.0 in | Wt 174.2 lb

## 2016-11-26 DIAGNOSIS — K863 Pseudocyst of pancreas: Secondary | ICD-10-CM | POA: Diagnosis present

## 2016-11-26 DIAGNOSIS — K869 Disease of pancreas, unspecified: Secondary | ICD-10-CM | POA: Diagnosis not present

## 2016-11-26 DIAGNOSIS — Z8719 Personal history of other diseases of the digestive system: Secondary | ICD-10-CM

## 2016-11-26 DIAGNOSIS — K760 Fatty (change of) liver, not elsewhere classified: Secondary | ICD-10-CM | POA: Insufficient documentation

## 2016-11-26 MED ORDER — IOPAMIDOL (ISOVUE-300) INJECTION 61%
INTRAVENOUS | Status: AC
  Filled 2016-11-26: qty 100

## 2016-11-26 MED ORDER — SODIUM CHLORIDE 0.9 % IJ SOLN
INTRAMUSCULAR | Status: AC
  Filled 2016-11-26: qty 50

## 2016-11-26 MED ORDER — IOPAMIDOL (ISOVUE-300) INJECTION 61%
100.0000 mL | Freq: Once | INTRAVENOUS | Status: AC | PRN
  Administered 2016-11-26: 100 mL via INTRAVENOUS

## 2016-11-26 NOTE — Patient Instructions (Addendum)
It was a pleasure to see you again [name].  We will check blood work and obtain a CT scan to evaluate your abdominal pain further. I will call you with the results and further workup if needed. We will need to consider having you follow up with Dr. Leone PayorGessner again.  Please call us if your symptoms worsen or you begin to have fevers or new symptoms.

## 2016-11-26 NOTE — Progress Notes (Signed)
CC: Abdominal Pain  HPI:  Drew Prince is a 40 y.o. male with PMH as listed below who presents for follow up management of pancreatic pseudocyst with abdominal pain.  Pancreatic necrosis with pseudocyst: Patient with history of severe biliary pancreatitis with necrosis s/p lap chole on 3/817 with post-op bile leak. He developed a pancreatic pseudocyst which did not resolve after several months and went for laparoscopic cyst gastrostomy on 06/10/16.   He has been doing well until about 1 week ago at which time he began to have upper abdominal pain described "like a fist" pushing up his left upper abdomen and a sharp stabbing pain at his right upper abdomen with radiation to the back. Pain initially occurred only when laying down, but has progressed now occurring when sitting or standing. Pain is rated 4-7/10 in severity, worse with deep inspiration. He has associated chills, nausea, and decreased appetite. He has been afebrile at home as well as here. He denies any emesis, dyspnea, chest pain, LOC, diarrhea, or constipation. He denies any hematochezia or melena. He feels his current symptoms are similar, but less severe to his prior pancreatitis episodes.   He last had a CT abdomen with contrast on 07/21/16 which showed reduction in the pancreatic pseudocyst:  "Fluid collection along the body and tail the pancreas is decreased in volume following cyst gastrostomy. Collection is very small measuring 12 mm by 13 mm compared to approximately 56 mm by 35 mm. No biliary duct dilatation. Pancreatic parenchyma enhances normally."  He does not relate his current symptoms to oral intake. He takes Oxycodone 5 mg prn for pain with some relief. He reports alcohol cessation since last visit and avoids fatty foods.    Past Medical History:  Diagnosis Date  . Acute pancreatitis    1-8-17to 02-03-16 [facility]  . Bile leak   . Depression   . GERD (gastroesophageal reflux disease)   . H/O  seasonal allergies    OTC med used  . Hypercholesteremia   . Hypertension    01-05-16 to 02-03-16 Hospitalized for pancreatitis, B/P med discontinued during that time.  . Pancreatic necrosis   . Pancreatic pseudocyst   . PONV (postoperative nausea and vomiting) 03-04-16   ponv after cholecystectomy    Review of Systems:   Review of Systems  Constitutional: Positive for chills. Negative for fever and weight loss.  Respiratory: Negative for shortness of breath.   Cardiovascular: Negative for chest pain.  Gastrointestinal: Positive for abdominal pain. Negative for blood in stool, constipation, diarrhea and melena.  Neurological: Negative for loss of consciousness.     Physical Exam:  Vitals:   11/26/16 1431  BP: (!) 140/101  Pulse: 96  Temp: 98.5 F (36.9 C)  TempSrc: Oral  SpO2: 100%  Weight: 174 lb 3.2 oz (79 kg)  Height: 5\' 4"  (1.626 m)   Physical Exam  Constitutional: He is oriented to person, place, and time.  HENT:  Head: Normocephalic and atraumatic.  Eyes: No scleral icterus.  Cardiovascular: Normal rate and regular rhythm.   Pulmonary/Chest: Effort normal. No respiratory distress. He has no wheezes.  Abdominal: Soft. Bowel sounds are normal. He exhibits no mass. There is no guarding.  Mild tenderness in the epigastric, RUQ, RLQ, and LUQ areas.  Musculoskeletal: He exhibits no edema.  Neurological: He is alert and oriented to person, place, and time.  Skin: Skin is warm.    Assessment & Plan:   See Encounters Tab for problem based charting.  Patient discussed with Dr. Heide SparkNarendra

## 2016-11-26 NOTE — Assessment & Plan Note (Addendum)
Patient with history of severe biliary pancreatitis with necrosis s/p lap chole on 3/817 with post-op bile leak. He developed a pancreatic pseudocyst which did not resolve after several months and went for laparoscopic cyst gastrostomy on 06/10/16.   He has been doing well until about 1 week ago at which time he began to have upper abdominal pain described "like a fist" pushing up his left upper abdomen and a sharp stabbing pain at his right upper abdomen with radiation to the back. Pain initially occurred only when laying down, but has progressed now occurring when sitting or standing. Pain is rated 4-7/10 in severity, worse with deep inspiration. He has associated chills, nausea, and decreased appetite. He has been afebrile at home as well as here. He denies any emesis, dyspnea, chest pain, LOC, diarrhea, or constipation. He denies any hematochezia or melena. He feels his current symptoms are similar, but less severe to his prior pancreatitis episodes.   He last had a CT abdomen with contrast on 07/21/16 which showed reduction in the pancreatic pseudocyst:  "Fluid collection along the body and tail the pancreas is decreased in volume following cyst gastrostomy. Collection is very small measuring 12 mm by 13 mm compared to approximately 56 mm by 35 mm. No biliary duct dilatation. Pancreatic parenchyma enhances normally."  He does not relate his current symptoms to oral intake. He takes Oxycodone 5 mg prn for pain with some relief. He reports alcohol cessation since last visit and avoids fatty foods.  A/P: Symptoms concerning for early/mild pancreatitis versus complications from prior pseudocyst. May also be related to scar tissue or adhesions. We will check blood work and obtain repeat imaging to further evaluate his pseudocyst. He is otherwise afebrile with mildly elevated BP and does not appear in acute distress.  -Check Lipase, CBC, CMP -Obtain CT Abdomen with Contrast -If above results reassuring,  will recommend patient follow up with GI, Dr. Leone PayorGessner.   ADDENDUM: Labwork and CT scan are reassuring. Lipase is normal and CT does not show any evidence of pancreatitis. Report does not comment on previously seen pseudocyst. I discussed the results with the patient. Suspect that his pain may be from scar tissue. He will call us if symptoms continue to worsen. He will follow up with GI as well.

## 2016-11-27 ENCOUNTER — Encounter: Payer: Self-pay | Admitting: Internal Medicine

## 2016-11-27 LAB — CMP14 + ANION GAP
ALT: 48 IU/L — AB (ref 0–44)
ANION GAP: 17 mmol/L (ref 10.0–18.0)
AST: 29 IU/L (ref 0–40)
Albumin/Globulin Ratio: 1.7 (ref 1.2–2.2)
Albumin: 5 g/dL (ref 3.5–5.5)
Alkaline Phosphatase: 91 IU/L (ref 39–117)
BUN/Creatinine Ratio: 6 — ABNORMAL LOW (ref 9–20)
BUN: 7 mg/dL (ref 6–24)
Bilirubin Total: 1.3 mg/dL — ABNORMAL HIGH (ref 0.0–1.2)
CALCIUM: 9.8 mg/dL (ref 8.7–10.2)
CO2: 28 mmol/L (ref 18–29)
CREATININE: 1.1 mg/dL (ref 0.76–1.27)
Chloride: 97 mmol/L (ref 96–106)
GFR calc Af Amer: 96 mL/min/{1.73_m2} (ref >59)
GFR, EST NON AFRICAN AMERICAN: 83 mL/min/{1.73_m2} (ref >59)
GLOBULIN, TOTAL: 2.9 g/dL (ref 1.5–4.5)
GLUCOSE: 83 mg/dL (ref 65–99)
Potassium: 4.9 mmol/L (ref 3.5–5.2)
Sodium: 142 mmol/L (ref 134–144)
Total Protein: 7.9 g/dL (ref 6.0–8.5)

## 2016-11-27 LAB — CBC
Hematocrit: 45.7 % (ref 37.5–51.0)
Hemoglobin: 14.5 g/dL (ref 12.6–17.7)
MCH: 21.7 pg — ABNORMAL LOW (ref 26.6–33.0)
MCHC: 31.7 g/dL (ref 31.5–35.7)
MCV: 69 fL — AB (ref 79–97)
PLATELETS: 344 10*3/uL (ref 150–379)
RBC: 6.67 x10E6/uL — AB (ref 4.14–5.80)
RDW: 19.2 % — ABNORMAL HIGH (ref 12.3–15.4)
WBC: 7.9 10*3/uL (ref 3.4–10.8)

## 2016-11-27 LAB — LIPASE: LIPASE: 23 U/L (ref 13–78)

## 2016-11-29 NOTE — Progress Notes (Signed)
Internal Medicine Clinic Attending  Case discussed with Dr. Patel,Vishal at the time of the visit.  We reviewed the resident's history and exam and pertinent patient test results.  I agree with the assessment, diagnosis, and plan of care documented in the resident's note.  

## 2016-12-01 ENCOUNTER — Encounter: Payer: Self-pay | Admitting: Internal Medicine

## 2016-12-09 ENCOUNTER — Encounter: Payer: Self-pay | Admitting: Nurse Practitioner

## 2016-12-09 ENCOUNTER — Ambulatory Visit (INDEPENDENT_AMBULATORY_CARE_PROVIDER_SITE_OTHER): Payer: BC Managed Care – PPO | Admitting: Nurse Practitioner

## 2016-12-09 VITALS — BP 148/100 | HR 82 | Ht 64.0 in | Wt 173.4 lb

## 2016-12-09 DIAGNOSIS — R1010 Upper abdominal pain, unspecified: Secondary | ICD-10-CM

## 2016-12-09 MED ORDER — AMITRIPTYLINE HCL 25 MG PO TABS: 25.0000 mg | ORAL_TABLET | Freq: Every day | ORAL | 1 refills | Status: DC

## 2016-12-09 MED ORDER — KETOROLAC TROMETHAMINE 10 MG PO TABS: 10.0000 mg | ORAL_TABLET | Freq: Three times a day (TID) | ORAL | 1 refills | Status: DC | PRN

## 2016-12-09 MED ORDER — DICYCLOMINE HCL 10 MG PO CAPS: 10.0000 mg | ORAL_CAPSULE | Freq: Two times a day (BID) | ORAL | 1 refills | Status: DC

## 2016-12-09 NOTE — Patient Instructions (Signed)
We have sent your prescriptions to your pharmacy  (Amitriptyline,Bentyl,Toradol)  Continue Pantoprazole   Follow up with Dr Leone PayorGessner 01/07/2017 at 3:15pm

## 2016-12-09 NOTE — Progress Notes (Signed)
     HPI: Patient is a 40 year old male known to Dr. Leone PayorGessner. He has a history of severe biliary pancreatitis complicated by pseudocysts. He underwent cholecystectomy earlier this year and that was complicated by a bile duct leak requring stent placement 03/16/16. Stent was removed in June.  Also in June he underwent cyst gastrostomy.   Patient has continue to have abdominal pain. Has been in contact via email with Dr. Leone PayorGessner. He's been having pain under both rib cages. CT scan with contrast 11/26/16 was unremarkable . CBC, lipase and chem profile unrevealing.  He describes the pain as feeling like a fist is under his left rib cage at all times. He gets intermittent stabbing pain in the area. He also has similar pain under the right rib cage without the intermittent stabbing. Early on pain was most noticeable when lying flat. Now the pain is present in standing and sitting position as well. Eating large meals is difficult as it pushes out the left rib cage and causes pain. Hard to sleep on his sides, pain interrupting his sleep. Tylenol or ibuprofen of helped. Patient has been in contact with surgery who doesn't feel pain related to surgery apparently. Bowel movements are normal. Patient has lost a lot of weight from the pancreatitis but he's been able to gain a few pounds back.   Past Medical History:  Diagnosis Date  . Acute pancreatitis    1-8-17to 02-03-16 Hospital stay at [facility].  . Bile leak   . Depression   . GERD (gastroesophageal reflux disease)   . H/O seasonal allergies    OTC med used  . Hypercholesteremia   . Hypertension    01-05-16 to 02-03-16 Hospitalized for pancreatitis, B/P med discontinued during that time.  . Pancreatic necrosis   . Pancreatic pseudocyst   . PONV (postoperative nausea and vomiting) 03-04-16   ponv after cholecystectomy    Patient's surgical history, family medical history, social history, medications and allergies were all reviewed in Epic    Physical  Exam: BP (!) 148/100   Pulse 82   Ht 5\' 4"  (1.626 m)   Wt 173 lb 6 oz (78.6 kg)   BMI 29.76 kg/m   GENERAL: well developed male in NAD PSYCH: :Pleasant, cooperative, normal affect HEENT: Normocephalic, conjunctiva pink, mucous membranes moist, neck supple without masses CARDIAC:  RRR, no murmur heard, no peripheral edema PULM: Normal respiratory effort, lungs CTA bilaterally, no wheezing ABDOMEN:  soft, nontender, nondistended, no obvious masses, no hepatomegaly,  normal bowel sounds SKIN:  turgor, no lesions seen Musculoskeletal:  Normal muscle tone, normal strength NEURO: Alert and oriented x 3, no focal neurologic deficits   ASSESSMENT AND PLAN:  Chronic bilateral upper abdominal pain, 40 yo male with biliary pancreatitis complicated by pseudocyst in June. He underwent cholecystectomy which was competent by a bile leak. We placed and subsequently removed biliary stent for the leak in June. Also in June patient underwent cyst gastrostomy for pseudocyst. Patient has been plagued with pain under both ribs, predominantly the left. Pain is constant now, difficult to find a comfortable position. Deep breaths and big meals exacerbate the pain.  -Pain related to scar tissue? Neuropathic pain?   Doubt EGD would yield much. Recent imaging unremarkable.  -for now will try 25mg  Amitriptyline at night.  -continue PPI -trial of Bentyl BID -Trial of toradol for pain   Willette ClusterPaula Guenther , NP 12/09/2016, 1:39 PM

## 2016-12-19 ENCOUNTER — Telehealth: Payer: Self-pay | Admitting: Internal Medicine

## 2016-12-19 ENCOUNTER — Encounter (HOSPITAL_COMMUNITY): Payer: Self-pay | Admitting: Emergency Medicine

## 2016-12-19 ENCOUNTER — Emergency Department (HOSPITAL_COMMUNITY): Payer: BC Managed Care – PPO

## 2016-12-19 ENCOUNTER — Emergency Department (HOSPITAL_COMMUNITY)
Admission: EM | Admit: 2016-12-19 | Discharge: 2016-12-20 | Disposition: A | Discharge status: Home / Self Care | Payer: BC Managed Care – PPO | Attending: Emergency Medicine | Admitting: Emergency Medicine

## 2016-12-19 DIAGNOSIS — K529 Noninfective gastroenteritis and colitis, unspecified: Secondary | ICD-10-CM

## 2016-12-19 DIAGNOSIS — R1031 Right lower quadrant pain: Secondary | ICD-10-CM

## 2016-12-19 DIAGNOSIS — R1012 Left upper quadrant pain: Secondary | ICD-10-CM | POA: Diagnosis present

## 2016-12-19 LAB — COMPREHENSIVE METABOLIC PANEL
ALK PHOS: 77 U/L (ref 38–126)
ALT: 44 U/L (ref 17–63)
AST: 34 U/L (ref 15–41)
Albumin: 4.5 g/dL (ref 3.5–5.0)
Anion gap: 10 (ref 5–15)
BILIRUBIN TOTAL: 2.1 mg/dL — AB (ref 0.3–1.2)
BUN: 17 mg/dL (ref 6–20)
CALCIUM: 9.3 mg/dL (ref 8.9–10.3)
CO2: 23 mmol/L (ref 22–32)
CREATININE: 1.07 mg/dL (ref 0.61–1.24)
Chloride: 104 mmol/L (ref 101–111)
GFR calc Af Amer: >60 mL/min (ref >60)
Glucose, Bld: 112 mg/dL — ABNORMAL HIGH (ref 65–99)
Potassium: 3.8 mmol/L (ref 3.5–5.1)
Sodium: 137 mmol/L (ref 135–145)
TOTAL PROTEIN: 7.7 g/dL (ref 6.5–8.1)

## 2016-12-19 LAB — URINALYSIS, ROUTINE W REFLEX MICROSCOPIC
BILIRUBIN URINE: NEGATIVE
Glucose, UA: NEGATIVE mg/dL
Hgb urine dipstick: NEGATIVE
KETONES UR: NEGATIVE mg/dL
Leukocytes, UA: NEGATIVE
Nitrite: NEGATIVE
PROTEIN: 100 mg/dL — AB
Specific Gravity, Urine: 1.03 (ref 1.005–1.030)
pH: 5 (ref 5.0–8.0)

## 2016-12-19 LAB — CBC WITH DIFFERENTIAL/PLATELET
Basophils Absolute: 0 10*3/uL (ref 0.0–0.1)
Basophils Relative: 0 %
EOS PCT: 0 %
Eosinophils Absolute: 0 10*3/uL (ref 0.0–0.7)
HEMATOCRIT: 45.6 % (ref 39.0–52.0)
Hemoglobin: 15.1 g/dL (ref 13.0–17.0)
Lymphocytes Relative: 3 %
Lymphs Abs: 0.4 10*3/uL — ABNORMAL LOW (ref 0.7–4.0)
MCH: 22.8 pg — ABNORMAL LOW (ref 26.0–34.0)
MCHC: 33.1 g/dL (ref 30.0–36.0)
MCV: 68.8 fL — AB (ref 78.0–100.0)
MONOS PCT: 5 %
Monocytes Absolute: 0.6 10*3/uL (ref 0.1–1.0)
Neutro Abs: 10.8 10*3/uL — ABNORMAL HIGH (ref 1.7–7.7)
Neutrophils Relative %: 92 %
Platelets: 260 10*3/uL (ref 150–400)
RBC: 6.63 MIL/uL — AB (ref 4.22–5.81)
RDW: 17.1 % — AB (ref 11.5–15.5)
WBC: 11.8 10*3/uL — AB (ref 4.0–10.5)

## 2016-12-19 LAB — I-STAT TROPONIN, ED: Troponin i, poc: 0 ng/mL (ref 0.00–0.08)

## 2016-12-19 LAB — I-STAT CG4 LACTIC ACID, ED
LACTIC ACID, VENOUS: 1.34 mmol/L (ref 0.5–1.9)
Lactic Acid, Venous: 1.39 mmol/L (ref 0.5–1.9)

## 2016-12-19 LAB — PROTIME-INR
INR: 0.99
Prothrombin Time: 13.1 seconds (ref 11.4–15.2)

## 2016-12-19 LAB — LIPASE, BLOOD: LIPASE: 18 U/L (ref 11–51)

## 2016-12-19 MED ORDER — ONDANSETRON HCL 4 MG/2ML IJ SOLN
4.0000 mg | Freq: Once | INTRAMUSCULAR | Status: AC
  Administered 2016-12-19: 4 mg via INTRAVENOUS
  Filled 2016-12-19: qty 2

## 2016-12-19 MED ORDER — CIPROFLOXACIN IN D5W 400 MG/200ML IV SOLN
400.0000 mg | Freq: Once | INTRAVENOUS | Status: AC
  Administered 2016-12-19: 400 mg via INTRAVENOUS
  Filled 2016-12-19: qty 200

## 2016-12-19 MED ORDER — METRONIDAZOLE 500 MG PO TABS: 500.0000 mg | ORAL_TABLET | Freq: Two times a day (BID) | ORAL | 0 refills | Status: DC

## 2016-12-19 MED ORDER — HYDROMORPHONE HCL 2 MG/ML IJ SOLN
1.0000 mg | Freq: Once | INTRAMUSCULAR | Status: AC
  Administered 2016-12-19: 1 mg via INTRAVENOUS
  Filled 2016-12-19: qty 1

## 2016-12-19 MED ORDER — ACETAMINOPHEN 325 MG PO TABS
650.0000 mg | ORAL_TABLET | Freq: Once | ORAL | Status: AC
  Administered 2016-12-19: 650 mg via ORAL
  Filled 2016-12-19: qty 2

## 2016-12-19 MED ORDER — ONDANSETRON 4 MG PO TBDP
4.0000 mg | ORAL_TABLET | Freq: Once | ORAL | Status: AC
  Administered 2016-12-19: 4 mg via ORAL
  Filled 2016-12-19: qty 1

## 2016-12-19 MED ORDER — IOPAMIDOL (ISOVUE-300) INJECTION 61%
INTRAVENOUS | Status: AC
  Administered 2016-12-19: 100 mL
  Filled 2016-12-19: qty 30

## 2016-12-19 MED ORDER — IOPAMIDOL (ISOVUE-300) INJECTION 61%
INTRAVENOUS | Status: AC
  Filled 2016-12-19: qty 100

## 2016-12-19 MED ORDER — HYDROCODONE-ACETAMINOPHEN 5-325 MG PO TABS
1.0000 | ORAL_TABLET | Freq: Once | ORAL | Status: AC
  Administered 2016-12-19: 1 via ORAL
  Filled 2016-12-19: qty 1

## 2016-12-19 MED ORDER — SODIUM CHLORIDE 0.9 % IV BOLUS (SEPSIS)
1000.0000 mL | Freq: Once | INTRAVENOUS | Status: AC
  Administered 2016-12-19: 1000 mL via INTRAVENOUS

## 2016-12-19 MED ORDER — METOCLOPRAMIDE HCL 5 MG/ML IJ SOLN
10.0000 mg | Freq: Once | INTRAMUSCULAR | Status: AC
Start: 2016-12-19 — End: 2016-12-19
  Administered 2016-12-19: 10 mg via INTRAVENOUS
  Filled 2016-12-19: qty 2

## 2016-12-19 MED ORDER — SODIUM CHLORIDE 0.9 % IV BOLUS (SEPSIS)
500.0000 mL | Freq: Once | INTRAVENOUS | Status: AC
  Administered 2016-12-19: 500 mL via INTRAVENOUS

## 2016-12-19 MED ORDER — GI COCKTAIL ~~LOC~~
30.0000 mL | Freq: Once | ORAL | Status: AC
  Administered 2016-12-19: 30 mL via ORAL
  Filled 2016-12-19: qty 30

## 2016-12-19 MED ORDER — METRONIDAZOLE IN NACL 5-0.79 MG/ML-% IV SOLN
500.0000 mg | Freq: Once | INTRAVENOUS | Status: AC
  Administered 2016-12-19: 500 mg via INTRAVENOUS
  Filled 2016-12-19: qty 100

## 2016-12-19 MED ORDER — CIPROFLOXACIN HCL 500 MG PO TABS: 500.0000 mg | ORAL_TABLET | Freq: Two times a day (BID) | ORAL | 0 refills | Status: DC

## 2016-12-19 NOTE — Discharge Instructions (Signed)
Follow-up with your primary care provider within 24 hours for repeat abdominal exam. If you're still having significant symptoms such as fever, right lower quadrant pain, nausea/vomiting and still having pain on exam please return to the emergency department. Take the antibiotics as prescribed. If you're symptoms resolve over the next 24-48 hours and no longer have the pain and the fever resolves she may stop the antibiotics and not take the full course.

## 2016-12-19 NOTE — ED Triage Notes (Signed)
Pt c/o bilateral upper quadrant abdominal pain ongoing since June. Pt states its gotten worse this past week, pt also had diarrhea and dryheaving this morning. Pt tachycardic of 155 in triage.

## 2016-12-19 NOTE — ED Provider Notes (Signed)
[facility]-EMER[facility]ENCY DEPT Provider Note   CSN: [id number] Arrival date & time: 12/19/16  1436   History   Chief Complaint Chief Complaint  Patient presents with  . Abdominal Pain    HPI Drew Prince is a 40 y.o. male.  The history is provided by the patient and the spouse.  Abdominal Pain   This is a chronic problem. The current episode started 6 to 12 hours ago. The problem occurs constantly. The problem has been gradually worsening. The pain is located in the LUQ, RUQ and epigastric region. The quality of the pain is aching. The pain is at a severity of 6/10. The pain is moderate. Associated symptoms include anorexia, fever, diarrhea, nausea and vomiting. Pertinent negatives include flatus, melena, constipation, dysuria, hematuria and headaches. The symptoms are aggravated by certain positions and palpation. Past workup includes CT scan and surgery. Past medical history comments: pancreatitis, pancreatic pseudocyst, cholecystectomy.    Past Medical History:  Diagnosis Date  . Acute pancreatitis    1-8-17to 02-03-16 Hospital stay at [facility].  . Bile leak   . Depression   . GERD (gastroesophageal reflux disease)   . H/O seasonal allergies    OTC med used  . Hypercholesteremia   . Hypertension    01-05-16 to 02-03-16 Hospitalized for pancreatitis, B/P med discontinued during that time.  . Pancreatic necrosis   . Pancreatic pseudocyst   . PONV (postoperative nausea and vomiting) 03-04-16   ponv after cholecystectomy    Patient Active Problem List   Diagnosis Date Noted  . GERD (gastroesophageal reflux disease) 07/01/2016  . Post-operative state 06/10/2016  . Disorder of bile duct stent   . Encounter for removal of biliary stent   . Bile leak   . Generalized abdominal pain   . Pancreatic necrosis   . Pancreatic pseudocyst   . Acute pancreatitis 01/05/2016  . Essential hypertension 01/05/2016  . Hyperlipidemia 01/05/2016    Past Surgical History:  Procedure Laterality Date  .  CHOLECYSTECTOMY N/A 03/04/2016   Procedure: LAPAROSCOPIC CHOLECYSTECTOMY ;  Surgeon: Axel FillerArmando Ramirez, MD;  Location: [facility] ORS;  Service: General;  Laterality: N/A;  . ERCP N/A 03/16/2016   Procedure: ENDOSCOPIC RETROGRADE CHOLANGIOPANCREATOGRAPHY (ERCP);  Surgeon: Meryl DareMalcolm T Stark, MD;  [facility];  Service: Endoscopy;  Laterality: N/A;  . ERCP N/A 06/05/2016   Procedure: ENDOSCOPIC RETROGRADE CHOLANGIOPANCREATOGRAPHY (ERCP);  Surgeon: Iva Booparl E Gessner, MD;  [facility];  Service: Endoscopy;  Laterality: N/A;  . GASTROINTESTINAL STENT REMOVAL N/A 06/05/2016   Procedure: GASTROINTESTINAL STENT REMOVAL;  Surgeon: Iva Booparl E Gessner, MD;  [facility];  Service: Endoscopy;  Laterality: N/A;  . HEMATOMA EVACUATION  03/09/2016   Procedure: LAPAROSCOPIC EVACUATION OF HEMATOMA;  Surgeon: Axel FillerArmando Ramirez, MD;  Location: [facility] OR;  Service: General;;  . LAPAROSCOPIC GASTROSTOMY N/A 06/10/2016   Procedure: LAPAROSCOPIC CYST GASTROSTOMY, INTRAOPERATIVE ULTRASOUND,  UPPER ENDOSCOPY ;  Surgeon: Axel FillerArmando Ramirez, MD;  Location: [facility] ORS;  Service: General;  Laterality: N/A;  . LAPAROSCOPY N/A 03/09/2016   Procedure: LAPAROSCOPY DIAGNOSTIC;  Surgeon: Axel FillerArmando Ramirez, MD;  Location: [facility] OR;  Service: General;  Laterality: N/A;  . WISDOM TOOTH EXTRACTION     "local only"       Home Medications    Prior to Admission medications   Medication Sig Start Date End Date Taking? Authorizing Provider  amitriptyline (ELAVIL) 25 MG tablet Take 1 tablet (25 mg total) by mouth at bedtime. 12/09/16   Meredith PelPaula M Guenther, NP  cetirizine (ZYRTEC) 10 MG tablet Take 10 mg  by mouth every evening.     Historical Provider, MD  dicyclomine (BENTYL) 10 MG capsule Take 1 capsule (10 mg total) by mouth 2 times daily at 12 noon and 4 pm. 12/09/16   Meredith Pel, NP  diphenhydrAMINE (BENADRYL) 25 MG tablet Take 25 mg by mouth every evening.     Historical Provider, MD  ketorolac (TORADOL) 10 MG tablet Take 1 tablet (10 mg  total) by mouth every 8 (eight) hours as needed. 12/09/16   Meredith Pel, NP  Oxycodone HCl 10 MG TABS Take 5 mg by mouth every 6 (six) hours as needed (For pain.). 04/27/16   Historical Provider, MD  pantoprazole (PROTONIX) 40 MG tablet Take 1 tablet (40 mg total) by mouth every evening. 08/10/16   Darreld Mclean, MD  senna-docusate (SENOKOT-S) 8.6-50 MG tablet Take 1 tablet by mouth daily.    Historical Provider, MD    Family History Family History  Problem Relation Age of Onset  . Hypertension Father     Social History Social History  Substance Use Topics  . Smoking status: Never Smoker  . Smokeless tobacco: Never Used  . Alcohol use 0.0 oz/week     Comment: rarely     Allergies   Penicillins   Review of Systems Review of Systems  Constitutional: Positive for appetite change, chills and fever.  HENT: Negative.   Respiratory: Negative for cough and shortness of breath.   Cardiovascular: Negative for chest pain.  Gastrointestinal: Positive for abdominal pain, anorexia, diarrhea, nausea and vomiting. Negative for blood in stool, constipation, flatus and melena.  Genitourinary: Positive for decreased urine volume. Negative for dysuria, flank pain and hematuria.  Musculoskeletal: Negative for back pain.  Skin: Negative for rash.  Neurological: Negative for headaches.  All other systems reviewed and are negative.    Physical Exam Updated Vital Signs BP (!) 147/101   Pulse (!) 131   Temp 100.8 F (38.2 C)   Resp 24   Ht 5\' 4"  (1.626 m)   Wt 78.5 kg   SpO2 97%   BMI 29.70 kg/m  Vitals:   12/19/16 2300 12/19/16 2315 12/19/16 2338 12/19/16 2345  BP: 127/81 126/80 127/93 128/92  Pulse: (!) 124 118 (!) 126 116  Resp: 17 18 17 20   Temp:      TempSrc:      SpO2: 96% 96% 98% 95%  Weight:      Height:       Physical Exam  Constitutional: He appears well-developed and well-nourished. No distress.  HENT:  Head: Normocephalic and atraumatic.  Mouth/Throat:  Oropharynx is clear and moist.  Eyes: Conjunctivae are normal. No scleral icterus.  Neck: Neck supple.  Cardiovascular: Regular rhythm, normal heart sounds and intact distal pulses.  Tachycardia present.   No murmur heard. Pulmonary/Chest: Effort normal and breath sounds normal. No respiratory distress. He has no wheezes. He has no rales.  Abdominal: Soft. He exhibits no distension. There is generalized tenderness and tenderness in the right upper quadrant, right lower quadrant, epigastric area and left upper quadrant. There is tenderness at McBurney's point. There is no rigidity, no rebound, no guarding (mild) and no CVA tenderness.  Musculoskeletal: He exhibits no edema.  Neurological: He is alert.  Skin: Skin is warm and dry. He is not diaphoretic.  Psychiatric: He has a normal mood and affect.  Nursing note and vitals reviewed.    ED Treatments / Results  Labs (all labs ordered are listed, but only abnormal results are displayed)  Labs Reviewed  CBC WITH DIFFERENTIAL/PLATELET - Abnormal; Notable for the following:       Result Value   WBC 11.8 (*)    RBC 6.63 (*)    MCV 68.8 (*)    MCH 22.8 (*)    RDW 17.1 (*)    Neutro Abs 10.8 (*)    Lymphs Abs 0.4 (*)    All other components within normal limits  COMPREHENSIVE METABOLIC PANEL - Abnormal; Notable for the following:    Glucose, Bld 112 (*)    Total Bilirubin 2.1 (*)    All other components within normal limits  URINALYSIS, ROUTINE W REFLEX MICROSCOPIC - Abnormal; Notable for the following:    Color, Urine AMBER (*)    APPearance HAZY (*)    Protein, ur 100 (*)    Bacteria, UA RARE (*)    Squamous Epithelial / LPF 0-5 (*)    All other components within normal limits  CULTURE, BLOOD (ROUTINE X 2)  CULTURE, BLOOD (ROUTINE X 2)  URINE CULTURE  PROTIME-INR  LIPASE, BLOOD  I-STAT TROPOININ, ED  I-STAT CG4 LACTIC ACID, ED  I-STAT CG4 LACTIC ACID, ED  I-STAT CG4 LACTIC ACID, ED  I-STAT CG4 LACTIC ACID, ED    EKG  EKG  Interpretation None       Radiology No results found.  Procedures Procedures (including critical care time)  Medications Ordered in ED Medications  sodium chloride 0.9 % bolus 1,000 mL (not administered)    And  sodium chloride 0.9 % bolus 1,000 mL (not administered)    And  sodium chloride 0.9 % bolus 500 mL (not administered)  HYDROmorphone (DILAUDID) injection 1 mg (not administered)  ondansetron (ZOFRAN) injection 4 mg (not administered)     Initial Impression / Assessment and Plan / ED Course  I have reviewed the triage vital signs and the nursing notes.  Pertinent labs & imaging results that were available during my care of the patient were reviewed by me and considered in my medical decision making (see chart for details).  Clinical Course    Patient is a 40 year old male with history of peritonitis, pancreatic pseudocysts, cholecystectomy who presents with worsening abdominal pain, fever, nausea, diarrhea since this morning.  On arrival patient is febrile 100.8, tachycardic up to 150s, normotensive. Patient has tenderness to palpation in the abdomen worst in the right lower quadrant but also in bilateral upper quadrants. Exam is non-peritonitic. Given fever and tachycardia could sepsis initiated. Patient given IV Cipro Floxin and Flagyl to cover for intra-abdominal infection. Patient has penicillin allergy so did not get Zosyn. Patient given 30 mL/KG normal saline fluid bolus. Lactic acid was normal. Tachycardia improved with the IV fluids. Patient given 1 additional liter of normal saline on top of the initial fluid resuscitation due to continued tachycardia. Heart rate did eventually improved to the 110s. Dilaudid and Zofran and Reglan given for symptomatic management.  CT abdomen/pelvis with contrast obtained to evaluate for intra-abdominal acute process. Primary concern was for acute appendicitis versus pancreatitis. CT does not show any acute intra-abdominal process.  However the appendix is read as upper limit of normal at 7 mm with intra-appendiceal fluid but no evidence of acute appendicitis. Patient reexamined after pain medication and has improved pain levels however still tender do palpation in the right lower quadrant. Patient's leukocytosis of 11.8. Normal lipase, normal lites, normal LFTs.  I spoke with Dr. Lindie Spruce, general surgery, regarding the patient's persistent tachycardia, presentation and right lower quadrant pain with his  CT findings. Dr. Lindie SpruceWyatt recommends repeat abdominal exam within 24 hours and discharging on antibiotics given no CT evidence of acute appendicitis at this time. Next  Patient is given PO challenge and passed. Discharge plan explain extensively including returning to the ED if his symptoms worsen, repeat abdominal exam within 24 hours if the symptoms persist, and hit the symptoms completely resolve then he does not have to do any of the above. Patient also instructed to take the prescribed ciprofloxacin and Flagyl but he may stop if his symptoms resolve. Patient understands and agrees to plan. Patient feels comfortable going home. Next  Patient is discharged in stable condition. Given the patient's nausea and diarrhea this may end up being a viral gastroenteritis.  Pt seen with attending Dr. Jodi MourningZavitz.   Final Clinical Impressions(s) / ED Diagnoses   Final diagnoses:  Right lower quadrant abdominal pain  Gastroenteritis    New Prescriptions Discharge Medication List as of 12/19/2016 11:42 PM    START taking these medications   Details  ciprofloxacin (CIPRO) 500 MG tablet Take 1 tablet (500 mg total) by mouth 2 (two) times daily., Starting Sat 12/19/2016, Until Thu 12/24/2016, Print    metroNIDAZOLE (FLAGYL) 500 MG tablet Take 1 tablet (500 mg total) by mouth 2 (two) times daily., Starting Sat 12/19/2016, Until Thu 12/24/2016, Print         Dwana MelenaRobin Elira Colasanti, DO 12/20/16 0121    Blane OharaJoshua Zavitz, MD 12/20/16 [id number]

## 2016-12-19 NOTE — Telephone Encounter (Signed)
   Reason for call:   I received a call from [name] at 11:15 AM indicating he woke up this morning at around 7-8 am and has been having diarrhea, nausea, dry heaving, fever, and abdominal/rib pain.    Pertinent Data:   Symptoms started at 7-8 am this morning.   Reports having 3 episodes of liquid stool since this morning. No melena or hematochezia.   Reports having nausea and dry heaving. No episodes of vomiting.   Reports checking his temperature 10 minutes ago and it was 99.56F   Reports having "stabbing" epigastric abdominal pain which radiates to his back and to his rib cage on both the left and the right sides. He describes it as a fist pushing up his rib cage. Pain is 4-7 out of 10 in intensity and worse when taking deep breaths. States he has not taken any pain medications today. Pain is worse when lying supine. No shortness of breath. Patient is speaking comfortably over the phone.  Reports taking Protonix daily. Denies having any GERD symptoms at present.   He was recently seen by GI who prescribed amitriptyline, toradol, and Bentyl. Patient states these medications have not helped with his pain.   Assessment / Plan / Recommendations:   Patient has a history of severe biliary pancreatitis with necrosis status post laparoscopic cholecystectomy in March 2017 with postop bile leak. He subsequently developed a pancreatic pseudocyst which did not resolve after several months and patient had to go for laparoscopic cyst gastrostomy in June 2017. He was recently seen by his PCP on 11/26/2016 for similar symptoms. Lab work including lipase was normal and CT without evidence of pancreatitis or complications of pancreatitis. Patient was then seen by gastroenterology on 12/09/2016 and his pain was thought to be related to scar tissue versus neuropathic in nature. He was started on amitriptyline, Bentyl, and Toradol.  Advised patient to continue taking his PPI  Advised him to  continue taking Bentyl and amitriptyline  Advised him to take Toradol as he has not taken any pain medications today  As always, pt is advised that if symptoms worsen or new symptoms arise within the next few hours, they should go to an urgent care facility or to to ER for further evaluation immediately.   Drew GiovanniVasundhra Symantha Steeber, MD   12/19/2016, 11:15 AM

## 2016-12-21 LAB — URINE CULTURE: CULTURE: NO GROWTH

## 2016-12-23 ENCOUNTER — Observation Stay (HOSPITAL_COMMUNITY): Payer: BC Managed Care – PPO

## 2016-12-23 ENCOUNTER — Observation Stay (HOSPITAL_COMMUNITY)
Admission: AD | Admit: 2016-12-23 | Discharge: 2016-12-25 | Disposition: A | Discharge status: Home / Self Care | Payer: BC Managed Care – PPO | Source: Ambulatory Visit | Attending: Oncology | Admitting: Oncology

## 2016-12-23 ENCOUNTER — Encounter (HOSPITAL_COMMUNITY): Payer: Self-pay | Admitting: Radiology

## 2016-12-23 ENCOUNTER — Ambulatory Visit (INDEPENDENT_AMBULATORY_CARE_PROVIDER_SITE_OTHER): Payer: BC Managed Care – PPO | Admitting: Internal Medicine

## 2016-12-23 ENCOUNTER — Encounter: Payer: Self-pay | Admitting: Internal Medicine

## 2016-12-23 VITALS — BP 173/115 | HR 105 | Temp 98.2°F | Ht 64.0 in | Wt 172.6 lb

## 2016-12-23 DIAGNOSIS — R112 Nausea with vomiting, unspecified: Secondary | ICD-10-CM | POA: Insufficient documentation

## 2016-12-23 DIAGNOSIS — R109 Unspecified abdominal pain: Secondary | ICD-10-CM

## 2016-12-23 DIAGNOSIS — M5137 Other intervertebral disc degeneration, lumbosacral region: Secondary | ICD-10-CM | POA: Insufficient documentation

## 2016-12-23 DIAGNOSIS — K5730 Diverticulosis of large intestine without perforation or abscess without bleeding: Secondary | ICD-10-CM | POA: Insufficient documentation

## 2016-12-23 DIAGNOSIS — E7800 Pure hypercholesterolemia, unspecified: Secondary | ICD-10-CM | POA: Diagnosis not present

## 2016-12-23 DIAGNOSIS — I10 Essential (primary) hypertension: Secondary | ICD-10-CM | POA: Diagnosis present

## 2016-12-23 DIAGNOSIS — Z8249 Family history of ischemic heart disease and other diseases of the circulatory system: Secondary | ICD-10-CM

## 2016-12-23 DIAGNOSIS — K219 Gastro-esophageal reflux disease without esophagitis: Secondary | ICD-10-CM | POA: Diagnosis not present

## 2016-12-23 DIAGNOSIS — Z880 Allergy status to penicillin: Secondary | ICD-10-CM | POA: Insufficient documentation

## 2016-12-23 DIAGNOSIS — K565 Intestinal adhesions [bands], unspecified as to partial versus complete obstruction: Secondary | ICD-10-CM | POA: Diagnosis not present

## 2016-12-23 DIAGNOSIS — M792 Neuralgia and neuritis, unspecified: Secondary | ICD-10-CM

## 2016-12-23 DIAGNOSIS — R740 Nonspecific elevation of levels of transaminase and lactic acid dehydrogenase [LDH]: Secondary | ICD-10-CM | POA: Insufficient documentation

## 2016-12-23 DIAGNOSIS — Z79899 Other long term (current) drug therapy: Secondary | ICD-10-CM

## 2016-12-23 DIAGNOSIS — R1031 Right lower quadrant pain: Principal | ICD-10-CM | POA: Diagnosis present

## 2016-12-23 DIAGNOSIS — R1084 Generalized abdominal pain: Secondary | ICD-10-CM

## 2016-12-23 DIAGNOSIS — R197 Diarrhea, unspecified: Secondary | ICD-10-CM | POA: Diagnosis not present

## 2016-12-23 DIAGNOSIS — Z9049 Acquired absence of other specified parts of digestive tract: Secondary | ICD-10-CM | POA: Insufficient documentation

## 2016-12-23 DIAGNOSIS — F329 Major depressive disorder, single episode, unspecified: Secondary | ICD-10-CM | POA: Insufficient documentation

## 2016-12-23 DIAGNOSIS — Z8719 Personal history of other diseases of the digestive system: Secondary | ICD-10-CM | POA: Diagnosis not present

## 2016-12-23 DIAGNOSIS — K760 Fatty (change of) liver, not elsewhere classified: Secondary | ICD-10-CM | POA: Insufficient documentation

## 2016-12-23 LAB — CBC WITH DIFFERENTIAL/PLATELET
BASOS PCT: 1 %
Basophils Absolute: 0.1 10*3/uL (ref 0.0–0.1)
EOS PCT: 4 %
Eosinophils Absolute: 0.2 10*3/uL (ref 0.0–0.7)
HCT: 42.4 % (ref 39.0–52.0)
Hemoglobin: 13.9 g/dL (ref 13.0–17.0)
Lymphocytes Relative: 34 %
Lymphs Abs: 1.8 10*3/uL (ref 0.7–4.0)
MCH: 22.7 pg — AB (ref 26.0–34.0)
MCHC: 32.8 g/dL (ref 30.0–36.0)
MCV: 69.2 fL — AB (ref 78.0–100.0)
MONO ABS: 0.4 10*3/uL (ref 0.1–1.0)
Monocytes Relative: 8 %
NEUTROS ABS: 2.9 10*3/uL (ref 1.7–7.7)
NEUTROS PCT: 53 %
PLATELETS: 344 10*3/uL (ref 150–400)
RBC: 6.13 MIL/uL — ABNORMAL HIGH (ref 4.22–5.81)
RDW: 17.1 % — ABNORMAL HIGH (ref 11.5–15.5)
WBC: 5.4 10*3/uL (ref 4.0–10.5)

## 2016-12-23 LAB — COMPREHENSIVE METABOLIC PANEL
ALK PHOS: 69 U/L (ref 38–126)
ALT: 63 U/L (ref 17–63)
ANION GAP: 7 (ref 5–15)
AST: 54 U/L — AB (ref 15–41)
Albumin: 4.1 g/dL (ref 3.5–5.0)
BILIRUBIN TOTAL: 0.8 mg/dL (ref 0.3–1.2)
BUN: 9 mg/dL (ref 6–20)
CALCIUM: 9.1 mg/dL (ref 8.9–10.3)
CO2: 28 mmol/L (ref 22–32)
CREATININE: 1.05 mg/dL (ref 0.61–1.24)
Chloride: 104 mmol/L (ref 101–111)
GFR calc Af Amer: >60 mL/min (ref >60)
GFR calc non Af Amer: >60 mL/min (ref >60)
GLUCOSE: 108 mg/dL — AB (ref 65–99)
Potassium: 3.7 mmol/L (ref 3.5–5.1)
SODIUM: 139 mmol/L (ref 135–145)
Total Protein: 6.9 g/dL (ref 6.5–8.1)

## 2016-12-23 LAB — LIPASE, BLOOD: Lipase: 26 U/L (ref 11–51)

## 2016-12-23 MED ORDER — KETOROLAC TROMETHAMINE 15 MG/ML IJ SOLN
30.0000 mg | Freq: Once | INTRAMUSCULAR | Status: AC
  Administered 2016-12-23: 30 mg via INTRAVENOUS
  Filled 2016-12-23: qty 2

## 2016-12-23 MED ORDER — PROMETHAZINE HCL 25 MG/ML IJ SOLN: 25.0000 mg | Freq: Four times a day (QID) | INTRAMUSCULAR | Status: DC | PRN

## 2016-12-23 MED ORDER — DICYCLOMINE HCL 10 MG PO CAPS
10.0000 mg | ORAL_CAPSULE | Freq: Two times a day (BID) | ORAL | Status: DC
  Administered 2016-12-24 – 2016-12-25 (×3): 10 mg via ORAL
  Filled 2016-12-23 (×3): qty 1

## 2016-12-23 MED ORDER — IOPAMIDOL (ISOVUE-300) INJECTION 61%
INTRAVENOUS | Status: AC
  Administered 2016-12-23: 100 mL
  Filled 2016-12-23: qty 100

## 2016-12-23 MED ORDER — AMITRIPTYLINE HCL 25 MG PO TABS
25.0000 mg | ORAL_TABLET | Freq: Every day | ORAL | Status: DC
  Administered 2016-12-23 – 2016-12-24 (×2): 25 mg via ORAL
  Filled 2016-12-23 (×2): qty 1

## 2016-12-23 MED ORDER — SODIUM CHLORIDE 0.9 % IV SOLN
INTRAVENOUS | Status: DC
  Administered 2016-12-23 – 2016-12-25 (×5): via INTRAVENOUS

## 2016-12-23 MED ORDER — SENNOSIDES-DOCUSATE SODIUM 8.6-50 MG PO TABS
1.0000 | ORAL_TABLET | Freq: Every day | ORAL | Status: DC
  Administered 2016-12-24 – 2016-12-25 (×2): 1 via ORAL
  Filled 2016-12-23 (×3): qty 1

## 2016-12-23 MED ORDER — IOPAMIDOL (ISOVUE-300) INJECTION 61%
INTRAVENOUS | Status: AC
  Filled 2016-12-23: qty 30

## 2016-12-23 MED ORDER — OXYCODONE HCL 5 MG PO TABS
5.0000 mg | ORAL_TABLET | Freq: Four times a day (QID) | ORAL | Status: DC | PRN
  Administered 2016-12-23: 5 mg via ORAL
  Filled 2016-12-23: qty 1

## 2016-12-23 MED ORDER — LORATADINE 10 MG PO TABS
10.0000 mg | ORAL_TABLET | Freq: Every day | ORAL | Status: DC
  Administered 2016-12-24 – 2016-12-25 (×2): 10 mg via ORAL
  Filled 2016-12-23 (×2): qty 1

## 2016-12-23 MED ORDER — ONDANSETRON HCL 4 MG/2ML IJ SOLN: 4.0000 mg | Freq: Three times a day (TID) | INTRAMUSCULAR | Status: DC | PRN

## 2016-12-23 MED ORDER — PANTOPRAZOLE SODIUM 40 MG PO TBEC
40.0000 mg | DELAYED_RELEASE_TABLET | Freq: Every evening | ORAL | Status: DC
  Administered 2016-12-24: 40 mg via ORAL
  Filled 2016-12-23: qty 1

## 2016-12-23 MED ORDER — ACETAMINOPHEN 325 MG PO TABS
650.0000 mg | ORAL_TABLET | Freq: Four times a day (QID) | ORAL | Status: DC | PRN
Start: 2016-12-23 — End: 2016-12-25
  Administered 2016-12-24: 650 mg via ORAL
  Filled 2016-12-23: qty 2

## 2016-12-23 MED ORDER — OXYCODONE HCL 5 MG PO TABS
5.0000 mg | ORAL_TABLET | ORAL | Status: DC | PRN
  Administered 2016-12-23 – 2016-12-24 (×3): 5 mg via ORAL
  Filled 2016-12-23 (×3): qty 1

## 2016-12-23 MED ORDER — ENOXAPARIN SODIUM 40 MG/0.4ML ~~LOC~~ SOLN
40.0000 mg | SUBCUTANEOUS | Status: DC
  Administered 2016-12-24 – 2016-12-25 (×2): 40 mg via SUBCUTANEOUS
  Filled 2016-12-23 (×2): qty 0.4

## 2016-12-23 MED ORDER — DIPHENHYDRAMINE HCL 25 MG PO TABS
25.0000 mg | ORAL_TABLET | Freq: Every evening | ORAL | Status: DC
  Administered 2016-12-23 – 2016-12-24 (×2): 25 mg via ORAL
  Filled 2016-12-23 (×5): qty 1

## 2016-12-23 NOTE — Assessment & Plan Note (Addendum)
Assessment: Patient presenting with generalized abdominal pain worse in the right lower quadrant. He was in the ED for this on the 23rd and noted to have a fever on presentation. CT of abdomen negative for any acute abdominal process. He did note that his appendix was 7 mm without any inflammation. He was discharged home with ciprofloxacin and Flagyl. However he stopped taking them yesterday due to diarrhea. Diarrhea has since resolved. Denies any recent fevers. His abdominal pain has worsened sinc he was seen in the emergency department.He rates abd pain as 6-7/10 and radiating in nature. On exam he is tender to palpation over his abdomen but mainly his right lower quadrant. Bilateral Rib pain has been present for a while and has been evaluated by surgery and GI.    plan: concern for appendicitis. Will admit to IMTS for observation and abd CT. Likely BP from pain and tachycardia from intraabd source. Ordered stat CMET, CBC, and lipase. CT abd ordered.

## 2016-12-23 NOTE — Progress Notes (Signed)
Report called to nurse on [address] [place].  Call to IV therapy for IV access will wait for patient to,mget to room on [address].  Patient was transported via wheelchair to [address].  Alert and oriented.  Angelina OkGladys Beverlyann Broxterman, RN 12/23/2016 4:35 PM.

## 2016-12-23 NOTE — H&P (Signed)
Date: 12/23/2016               Patient Name:  Drew Prince MRN: [id number]  DOB: 1976/04/25 Age / Sex: 40 y.o., male   PCP: Darreld McleanVishal Patel, MD         Medical Service: Internal Medicine Teaching Service         Attending Physician: Dr. Levert FeinsteinJames M Granfortuna, MD    First Contact: Dr. Thomasene LotJames Niara Bunker Pager: [phone]  Second Contact: Dr. Reggie PileVasu Rathore Pager: [phone]       After Hours (After 5p/  First Contact Pager: [phone]  weekends / holidays): Second Contact Pager: [phone]   Chief Complaint: Abdominal pain  History of Present Illness: The patient is a 40 year old male with a past medical history of pancreatitis, gastroesophageal reflux disease, hypercholesterolemia, hypertension and several prior abdominal surgeries including cholecystectomy and surgery for a pancreatic pseudocyst who presented to [facility] internal medicine [facility] clinic today with ongoing abdominal pain. The patient is endorsing generalized abdominal pain which is worse in the right lower quadrant. He was seen in the emergency department on 12/19/2016 and was noted to have a fever at this presentation. A CT abdomen was negative for any acute abdominal process.He was discharged on ciprofloxacin and Flagyl however he only took 2 doses of these medications as he developed diarrhea. His diarrhea has since resolved. His abdominal pain has continued to be present since his presentation to the emergency room. He rates his abdominal pain as a 6 out of 10. He complains of pain that is most severe in his right lower quadrant. Additionally, the patient endorses nausea and several episodes of dry heaving. He denies chest pain or shortness of breath. He denies recent fevers or chills. He denies constipation. He has not been around sick contacts. Due to his ongoing pain he was admitted to the [facility] internal medicine teaching service for appendicitis rule out.   Meds:  No outpatient prescriptions have been marked as taking  for the 12/23/16 encounter [facility](Hospital Encounter).     Allergies: Allergies as of 12/23/2016 - Review Complete 12/23/2016  Allergen Reaction Noted  . Penicillins Rash 01/05/2016   Past Medical History:  Diagnosis Date  . Acute pancreatitis    1-8-17to 02-03-16 [facility]  . Bile leak   . Depression   . GERD (gastroesophageal reflux disease)   . H/O seasonal allergies    OTC med used  . Hypercholesteremia   . Hypertension    01-05-16 to 02-03-16 Hospitalized for pancreatitis, B/P med discontinued during that time.  . Pancreatic necrosis   . Pancreatic pseudocyst   . PONV (postoperative nausea and vomiting) 03-04-16   ponv after cholecystectomy    Family History:  Family History  Problem Relation Age of Onset  . Hypertension Father      Social History:  Social History   Social History  . Marital status: Married    Spouse name: N/A  . Number of children: 1  . Years of education: N/A   Occupational History  . Pharmacy Tech    Social History Main Topics  . Smoking status: Never Smoker  . Smokeless tobacco: Never Used  . Alcohol use 0.0 oz/week     Comment: rarely  . Drug use: No  . Sexual activity: Not on file   Other Topics Concern  . Not on file   Social History Narrative  . No narrative on file     Review of Systems: A complete  ROS was negative except as per HPI.   Physical Exam: Blood pressure (!) 151/110, pulse 93, temperature 98.8 F (37.1 C), temperature source Oral, resp. rate 18, SpO2 96 %. Physical Exam  Constitutional: He is oriented to person, place, and time. He appears well-developed and well-nourished.  HENT:  Head: Normocephalic and atraumatic.  Cardiovascular: Normal rate and regular rhythm.  Exam reveals no gallop and no friction rub.   No murmur heard. Respiratory: Effort normal and breath sounds normal. No respiratory distress. He has no wheezes.  GI: There is tenderness.  Tender to palpation in the right lower quadrant.  Multiple surgical scars present. Bowel sounds present.  Musculoskeletal: He exhibits no edema.  Neurological: He is alert and oriented to person, place, and time.     EKG: Not obtained on today's admission  CXR: Not obtained on today's admission  Assessment & Plan by Problem: Active Problems:   * No active hospital problems. *  # Abdominal pain The patient has had ongoing abdominal pain over the last week including admission to the emergency department. At a prior admission he was given antibiotics and was unable to complete the course secondary to diarrhea. He presents to clinic with ongoing right lower quadrant abdominal pain. He was afebrile and clinically was noted to be tachycardic. On physical examination he has tenderness to palpation in the right lower quadrant. Lipase was normal. CBC was without leukocytosis. The exact etiology of the patient's abdominal pain is unclear at this time however the  differential diagnosis includes acute appendicitis, gastroenteritis or adhesive disease given multiple prior abdominal surgeries. However, I am less concerned for acute appendicitis giving lack of fever and no leukocytosis. I think his pain is more of a chronic issue but given the concern in clinic today for acute appendicitis we'll order a CT abdomen and pelvis for further evaluation. -- CT abdomen and pelvis without contrast -- Nothing by mouth until CT abdomen and pelvis result -- Normal saline at 125 ML's per hour -- Oxycodone 5 mg every 6 hours as needed for pain -- Promethazine 25 mg IV every 6 hours as needed for nausea and vomiting  # History of Chronic atypical abdominal pain ## GERD The patient also has very atypical chronic abdominal pain that has been consistent since prior surgeries. This pain is located bilaterally just inferior to the rib margin. He's been evaluated in the outpatient setting by gastroenterology who thinks this pain is most likely neuropathic in etiology and  started the patient on amitriptyline. We will continue amitriptyline while on the inpatient setting. -- Amitriptyline 25 mg once daily -- Continue pantoprazole 40 mg once daily  # Hypertension Patient has a history of hypertension. However, his documentation from clinic shows that he is not on any medications as his blood pressure has been at goal recently. He is currently hypertensive but I suspect this is secondary to abdominal pain. At this time we will continue to monitor and see if his hypertension improves with better pain control. -- Monitor clinically   DVT/PE prophylaxis: Lovenox FEN/GI: Regular Code: Full code Dispo: Admit patient to Observation with expected length of stay less than 2 midnights.  Signed: Thomasene LotJames Olie Dibert, MD 12/23/2016, 5:06 PM  Pager: [phone]

## 2016-12-23 NOTE — Progress Notes (Signed)
   CC: Abdominal pain  HPI:  Drew Prince is a 40 y.o. with past medical history as outlined below who presents to clinic for abdominal pain. Please see problem list for further details.  Past Medical History:  Diagnosis Date  . Acute pancreatitis    1-8-17to 02-03-16 [facility].  . Bile leak   . Depression   . GERD (gastroesophageal reflux disease)   . H/O seasonal allergies    OTC med used  . Hypercholesteremia   . Hypertension    01-05-16 to 02-03-16 Hospitalized for pancreatitis, B/P med discontinued during that time.  . Pancreatic necrosis   . Pancreatic pseudocyst   . PONV (postoperative nausea and vomiting) 03-04-16   ponv after cholecystectomy    Review of Systems:  Positive for nausea, diarrhea, bilateral rib pain, decreased appetite. Denies recent travel, new restaurants, fevers and vomiting. His wife is sick with nausea and vomiting that started yesterday. Physical Exam:  Vitals:   12/23/16 1431  BP: (!) 173/115  Pulse: (!) 105  Temp: 98.2 F (36.8 C)  TempSrc: Oral  SpO2: 99%  Weight: 172 lb 9.6 oz (78.3 kg)  Height: 5\' 4"  (1.626 m)   Physical Exam  Constitutional: oriented to person, place, and time. appears well-developed and well-nourished. No distress.  HENT:  Head: Normocephalic and atraumatic.  Nose: Nose normal.  Cardiovascular: Normal rate, regular rhythm and normal heart sounds.  Exam reveals no gallop and no friction rub.   No murmur heard. Abdominal: Soft. Bowel sounds are normal.  exhibits no distension. Generalized TTP of abd with RLQ the worst. Surgical laparotomy scars on abd  Assessment & Plan:   See Encounters Tab for problem based charting.  Patient discussed with Dr. Criselda PeachesMullen

## 2016-12-23 NOTE — Progress Notes (Signed)
Agree with Ms. Guenther's assessment and plan. Carl E. Gessner, MD, FACG   

## 2016-12-24 DIAGNOSIS — Z8719 Personal history of other diseases of the digestive system: Secondary | ICD-10-CM | POA: Diagnosis not present

## 2016-12-24 DIAGNOSIS — I10 Essential (primary) hypertension: Secondary | ICD-10-CM | POA: Diagnosis not present

## 2016-12-24 DIAGNOSIS — K565 Intestinal adhesions [bands], unspecified as to partial versus complete obstruction: Secondary | ICD-10-CM | POA: Diagnosis not present

## 2016-12-24 DIAGNOSIS — Z9049 Acquired absence of other specified parts of digestive tract: Secondary | ICD-10-CM | POA: Diagnosis not present

## 2016-12-24 DIAGNOSIS — R1031 Right lower quadrant pain: Secondary | ICD-10-CM | POA: Diagnosis not present

## 2016-12-24 LAB — CULTURE, BLOOD (ROUTINE X 2)
CULTURE: NO GROWTH
Culture: NO GROWTH

## 2016-12-24 MED ORDER — OXYCODONE HCL 5 MG PO TABS
5.0000 mg | ORAL_TABLET | Freq: Four times a day (QID) | ORAL | Status: DC | PRN
  Administered 2016-12-24 – 2016-12-25 (×3): 5 mg via ORAL
  Filled 2016-12-24 (×3): qty 1

## 2016-12-24 MED ORDER — KETOROLAC TROMETHAMINE 10 MG PO TABS
10.0000 mg | ORAL_TABLET | Freq: Three times a day (TID) | ORAL | Status: DC | PRN
  Administered 2016-12-24: 10 mg via ORAL
  Filled 2016-12-24 (×5): qty 1

## 2016-12-24 MED ORDER — OXYCODONE HCL 5 MG PO TABS
5.0000 mg | ORAL_TABLET | Freq: Once | ORAL | Status: AC
  Administered 2016-12-24: 5 mg via ORAL
  Filled 2016-12-24: qty 1

## 2016-12-24 NOTE — Progress Notes (Signed)
Pt asked for something stronger for pain- MD on call was paged. Awaiting any further orders

## 2016-12-24 NOTE — Progress Notes (Signed)
On-call MD aware of pt's elevated pressure at this time. RN will continue to assess pt.

## 2016-12-24 NOTE — Progress Notes (Signed)
   Subjective: Mr. Lana FishRiley Ferrari continued to have right lower quadrant abdominal pain overnight. He was only able to eat a small portion of his breakfast. He also mentions that he had diarrhea last week which resolved on its own. He denies chills, night sweats or nausea.   Objective:  Vital signs in last 24 hours: Vitals:   12/23/16 1654 12/23/16 2021 12/24/16 0521  BP: (!) 151/110 (!) 165/109 (!) 144/97  Pulse: 93 88 78  Resp: 18 18 16   Temp: 98.8 F (37.1 C) 98.5 F (36.9 C) 97.7 F (36.5 C)  TempSrc: Oral Oral Oral  SpO2: 96% 96% 96%    Physical Exam  Cardiovascular: Normal rate and regular rhythm.   No murmur heard. Pulmonary/Chest: Effort normal and breath sounds normal. No respiratory distress.  Abdominal: Soft. Bowel sounds are normal. He exhibits no distension and no mass. There is tenderness. There is no rebound and no guarding.  Right lower quadrant tenderness    Extremities: no calf tenderness, no peripheral edema  Assessment/Plan: Pt is a 40 y.o. yo male with a PMHx of pancreatitis s/p cholecystectomy, HTN, GERD, HLD who was admitted on 12/23/2016 with symptoms of abdominal pain.   Active Problems:   Right lower quadrant abdominal pain He remains afebrile. CT abdomen showed no changes in the appendix or abdomen which could correlate with this abdominal pain. He has remained afebrile with stable vitals which is reassuring. He has had multiple abdominal surgeries for a complicated course of pancreatitis. There is some suspicion that this abdominal pain could be related to adhesions. He does have bilateral lower rib pain which has been evaluated by GI and is thought to be related to adhesions, he has been taking amitriptyline for this.. Will try a trial of bowel rest and continued hydration and continue to monitor.  -NPO  -continue home medications amitriptyline 25 mg qhs, bentyl 10 mg BID, pantoprazole 40 mg qd, senna-docusate qd, and oxycodone IR 5 mg q4h PRN    -continue IV phenergan 25 mg q6h PRN   Hypertension Has a history of HTN which is being monitored outpatient. He has not needed any medications for this as his blood pressure has been at goal lately. His current high blood pressure readings are most likely related to his abdominal pain.   Dispo: Anticipated discharge in approximately 1-2 day(s).   LOS: 0 days   Eulah PontNina Kandise Riehle, MD 12/24/2016, 11:00 AM Pager: [phone]

## 2016-12-25 DIAGNOSIS — K565 Intestinal adhesions [bands], unspecified as to partial versus complete obstruction: Secondary | ICD-10-CM | POA: Diagnosis not present

## 2016-12-25 DIAGNOSIS — Z9049 Acquired absence of other specified parts of digestive tract: Secondary | ICD-10-CM | POA: Diagnosis not present

## 2016-12-25 DIAGNOSIS — R1031 Right lower quadrant pain: Secondary | ICD-10-CM | POA: Diagnosis not present

## 2016-12-25 DIAGNOSIS — Z8719 Personal history of other diseases of the digestive system: Secondary | ICD-10-CM | POA: Diagnosis not present

## 2016-12-25 MED ORDER — LOSARTAN POTASSIUM 50 MG PO TABS: 50.0000 mg | ORAL_TABLET | Freq: Every day | ORAL | Status: DC

## 2016-12-25 MED ORDER — CAPSAICIN 0.025 % EX CREA
TOPICAL_CREAM | Freq: Two times a day (BID) | CUTANEOUS | Status: DC
  Administered 2016-12-25: 11:00:00 via TOPICAL
  Filled 2016-12-25: qty 60

## 2016-12-25 MED ORDER — CAPSAICIN 0.075 % EX CREA
TOPICAL_CREAM | Freq: Two times a day (BID) | CUTANEOUS | Status: DC
Start: 2016-12-25 — End: 2016-12-25

## 2016-12-25 MED ORDER — LISINOPRIL 5 MG PO TABS: 5.0000 mg | ORAL_TABLET | Freq: Every day | ORAL | Status: DC

## 2016-12-25 NOTE — Progress Notes (Signed)
Discharge paperwork given to patient. No questions verbalized. Patient is ready for discharge. 

## 2016-12-25 NOTE — Progress Notes (Signed)
  Subjective: [name] feels that his abdominal pain is somewhat improved today. He had a bowel movement yesterday. He feels that the bowel rest may be what his helping his pain and feels that the oxycodone hasn't helped much.   Objective:  Vital signs in last 24 hours: Vitals:   12/24/16 1514 12/24/16 2127 12/24/16 2321 12/25/16 0553  BP: (!) 161/98 (!) 165/104 (!) 163/108 (!) 157/98  Pulse: 89 84  74  Resp: 16 17  18   Temp: 99.1 F (37.3 C) 98.3 F (36.8 C)  97.5 F (36.4 C)  TempSrc: Oral Oral  Oral  SpO2: 97% 97%  97%   Physical Exam  Constitutional: He appears well-developed and well-nourished. No distress.  Cardiovascular: Normal rate and regular rhythm.   No murmur heard. Pulmonary/Chest: Effort normal. No respiratory distress.  Abdominal: Soft. Bowel sounds are normal. He exhibits no distension and no mass. There is no guarding.  Right lower quadrant tenderness  Bilateral lower rib pain   Skin: He is not diaphoretic.     Assessment/Plan:  Active Problems:   Right lower quadrant abdominal pain Abdominal pain has improved somewhat today after bowel rest and having a bowel movement yesterday. He has remained afebrile without diarrhea. He does not feel that the oxycodone is helping his abdominal pain. GI workup 12/23 for b/l atypical abdominal pain just below his ribs was thought to be related to scar tissue or neuropathic and was started on amitriptyline. This RLQ pain may be related to scar tissue as well and he may need more time for amitriptyline to take effect.  -trial of heating pad  -trial of capsicum cream  -advance diet as tolerated  -continue home medications amitriptyline 25 mg qhs, bentyl 10 mg BID, pantoprazole 40 mg qd, and senokot qd  -discontinued oxycodone   Hypertension  Has been on lisinopril in the past for antihypertensives, this was discontinued and thought to be related to pancreatitis. He has been monitored outpatient and had good BP  control off of medications. His blood pressure has been elevated this admission but this is in the setting of his abdominal pain. Will continue to monitor, he may need to be started on antihypertensives at outpatient follow up if his BP has not improved.   Dispo: Anticipated discharge in approximately 1 day(s).   Eulah PontNina Emmalynn Pinkham, MD 12/25/2016, 9:20 AM Pager: [phone]

## 2016-12-25 NOTE — Discharge Summary (Signed)
Name: Drew Prince MRN: [id number] DOB: 07/02/1976 40 y.o. PCP: Darreld Mclean, MD  Date of Admission: 12/23/2016  4:52 PM Date of Discharge: 12/25/2016 Attending Physician: Dr. Cephas Darby   Discharge Diagnosis: 1. Abdominal pain related to surgical adhesions   Discharge Medications: Allergies as of 12/25/2016      Reactions   Penicillins Rash, Other (See Comments)   Has patient had a PCN reaction causing immediate rash, facial/tongue/throat swelling, SOB or lightheadedness with hypotension: No Has patient had a PCN reaction causing severe rash involving mucus membranes or skin necrosis: No Has patient had a PCN reaction that required hospitalization No Has patient had a PCN reaction occurring within the last 10 years: No If all of the above answers are "NO", then may proceed with Cephalosporin use.      Medication List    STOP taking these medications   ciprofloxacin 500 MG tablet Commonly known as:  CIPRO     TAKE these medications   amitriptyline 25 MG tablet Commonly known as:  ELAVIL Take 1 tablet (25 mg total) by mouth at bedtime.   cetirizine 10 MG tablet Commonly known as:  ZYRTEC Take 10 mg by mouth every evening.   dicyclomine 10 MG capsule Commonly known as:  BENTYL Take 1 capsule (10 mg total) by mouth 2 times daily at 12 noon and 4 pm.   diphenhydrAMINE 25 MG tablet Commonly known as:  BENADRYL Take 25 mg by mouth every evening.   ketorolac 10 MG tablet Commonly known as:  TORADOL Take 1 tablet (10 mg total) by mouth every 8 (eight) hours as needed. What changed:  reasons to take this   OxyCODONE HCl (Abuse Deter) 5 MG Taba Commonly known as:  OXAYDO Take 5 mg by mouth every 6 (six) hours as needed (for pain).   pantoprazole 40 MG tablet Commonly known as:  PROTONIX Take 1 tablet (40 mg total) by mouth every evening.   senna-docusate 8.6-50 MG tablet Commonly known as:  Senokot-S Take 1 tablet by mouth daily as needed for mild  constipation.      Disposition and follow-up:   [name] was discharged from [facility] in Stable condition.  At the hospital follow up visit please address:  1.  Abdominal pain- Has his pain improved? Has he tried to use clear liquid diet and stool softeners when it flairs? He should be scheduled for GI follow up   Hypertension- Is he hypertensive at follow up, does he need to be started on an anti-hypertensive medication?   2.  Labs / imaging needed at time of follow-up: none   3.  Pending labs/ test needing follow-up: none   Follow-up Appointments: Follow-up Information    [facility]. Schedule an appointment as soon as possible for a visit in 2 week(s).   Contact information: [address] [place] [address] [phone]         Hospital Course by problem list:    Right lower quadrant abdominal pain 40 year old man with history of complicated course of pancreatitis with pseudocyst and hematoma formation s/p cholecystectomy complicated by bile leak. Presented to clinic with right lower quadrant abdominal pain, rib pain, and nausea. He had gone multiple days without having a bowel movement. He had been started on amitriptiline two weeks prior to this hospitalization by GI who thought that his complaint of bilateral lower rib pain was related to scar tissues and adhesions. He was afebrile with  no leukocytosis, or elevation in lipase. He had mildly elevated transaminase but normal alkaline phosphatase or bilirubin. CT abdomen showed an enlarged appendix without inflammation or other signs of acute abdomen. He was placed on bowel rest for 24 hours and had a bowel movement and his pain improved somewhat but incompletely. Trials of oxycodone and capsicum cream did not work to relieve the pain. At discharge he was encouraged to continue the trial of amitriptyline and to try bowel rest and stool softeners if his pain  reoccurred. We had a discussion that his abdominal pain may be related to the scar tissues of his complicated pancreatitis course and that he may have a experience chronic abdominal pain as a result.     Essential hypertension Has been on lisinopril in the past for antihypertensives, this was discontinued and thought to be related to pancreatitis. He has been monitored outpatient and had good BP control off of medications. His blood pressure was elevated this admission but this was in the setting of abdominal pain. He may need to be started on antihypertensives at outpatient follow up if his BP has not improved.   Discharge Vitals:   BP (!) 154/107 (BP Location: Right Arm)   Pulse 94   Temp 98.6 F (37 C) (Oral)   Resp 18   SpO2 94%   Pertinent Labs, Studies, and Procedures: Procedures Performed:  Ct Abdomen Pelvis W Contrast  Result Date: 12/24/2016 CLINICAL DATA:  40 year old male with history of right lower quadrant abdominal pain common nausea and diarrhea for the past 5 days. EXAM: CT ABDOMEN AND PELVIS WITH CONTRAST TECHNIQUE: Multidetector CT imaging of the abdomen and pelvis was performed using the standard protocol following bolus administration of intravenous contrast. CONTRAST:  100 mL of Isovue-300. COMPARISON:  CT of the abdomen and pelvis 12/19/2016. FINDINGS: Lower chest: Mild scarring and/or atelectasis in the lower lobes of the lungs bilaterally. Hepatobiliary: Diffuse low attenuation throughout the hepatic parenchyma, compatible with hepatic steatosis. No discrete cystic or solid hepatic lesions. No intra or extrahepatic biliary ductal dilatation. Status post cholecystectomy. Pancreas: No pancreatic mass. No pancreatic ductal dilatation. No pancreatic or peripancreatic fluid or inflammatory changes. Spleen: Unremarkable. Adrenals/Urinary Tract: Bilateral adrenal glands and left kidney are normal in appearance. Subcentimeter low-attenuation lesion in the interpolar region of the  right kidney is too small to definitively characterize, but is similar to prior studies and likely tiny cysts. No hydroureteronephrosis. Urinary bladder is normal in appearance. Stomach/Bowel: Normal appearance of the stomach. No pathologic dilatation of small bowel or colon. A few scattered colonic diverticulae are noted, without surrounding inflammatory changes to suggest an acute diverticulitis at this time. The appendix is slightly prominent measuring 8 mm in diameter, however, the appendiceal lumen is filled with some gas and some fluid, as well as some oral contrast material in the proximal aspect of the appendix, and there are no surrounding inflammatory changes to suggest an acute appendicitis. Vascular/Lymphatic: No significant atherosclerotic disease, aneurysm or dissection identified in the abdominal or pelvic vasculature. No lymphadenopathy noted in the abdomen or pelvis. Reproductive: Prostate gland and seminal vesicles are unremarkable in appearance. Other: No significant volume of ascites.  No pneumoperitoneum. Musculoskeletal: There are no aggressive appearing lytic or blastic lesions noted in the visualized portions of the skeleton. IMPRESSION: 1. No acute findings in the abdomen or pelvis to account for the patient's symptoms. 2. Although the appendix appears slightly larger than typically seen (8 mm), the appendix appears patent and there are no surrounding inflammatory  changes to suggest an acute appendicitis at this time. 3. Hepatic steatosis. 4. Colonic diverticulosis without evidence of acute diverticulitis at this time. Electronically Signed   By: Trudie Reedaniel  Entrikin M.D.   On: 12/24/2016 08:28   Ct Abdomen Pelvis W Contrast  Result Date: 12/19/2016 CLINICAL DATA:  Pain in her LEFT rib cage, stabbing pain on RIGHT, history of pancreatitis, cholecystectomy, GERD, pancreatic pseudocyst EXAM: CT ABDOMEN AND PELVIS WITH CONTRAST TECHNIQUE: Multidetector CT imaging of the abdomen and pelvis was  performed using the standard protocol following bolus administration of intravenous contrast. Sagittal and coronal MPR images reconstructed from axial data set. CONTRAST:  100mL ISOVUE-300 IOPAMIDOL (ISOVUE-300) INJECTION 61% IV. COMPARISON:  11/26/2016 FINDINGS: Lower chest:  Lung bases clear. Hepatobiliary: Fatty infiltration of liver. No focal hepatic lesions. Gallbladder surgically absent. Pancreas: Normal appearance. No CT changes of acute pancreatitis or pseudocyst identified. Spleen: Normal appearance Adrenals/Urinary Tract: Adrenal glands normal appearance. Tiny RIGHT renal cyst. Kidneys, ureters, and decompressed bladder otherwise unremarkable. Small amount of excreted contrast material within renal collecting systems. Stomach/Bowel: Appendix fluid-filled and upper normal size 7 mm in diameter though no periappendiceal inflammatory changes are seen. Stomach well distended and unremarkable. Bowel loops grossly unremarkable for under distended and unopacified state. Vascular/Lymphatic: Aorta normal caliber. Numerous normal size mesenteric lymph nodes. No adenopathy. Reproductive: N/A Other: No free air or free fluid. Musculoskeletal: Degenerative disc disease changes L5-S1. No acute osseous findings. IMPRESSION: Upper normal appendix without periappendiceal inflammatory changes. Fatty infiltration of liver post cholecystectomy. No definite acute intra- abdominal or intrapelvic abnormalities. Electronically Signed   By: Ulyses SouthwardMark  Boles M.D.   On: 12/19/2016 17:51   Dg Chest Portable 1 View  Result Date: 12/19/2016 CLINICAL DATA:  Fevers EXAM: PORTABLE CHEST 1 VIEW COMPARISON:  03/08/2016 FINDINGS: The heart size and mediastinal contours are within normal limits. Both lungs are clear. The visualized skeletal structures are unremarkable. IMPRESSION: No active disease. Electronically Signed   By: Alcide CleverMark  Lukens M.D.   On: 12/19/2016 16:58   Discharge Instructions: Discharge Instructions    Call MD for:   persistant nausea and vomiting    Complete by:  As directed    Call MD for:  severe uncontrolled pain    Complete by:  As directed    Call MD for:  temperature >100.4    Complete by:  As directed    Diet - low sodium heart healthy    Complete by:  As directed    Increase activity slowly    Complete by:  As directed       Signed: Eulah PontNina Abid Bolla, MD 12/27/2016, 5:27 PM   Pager: [phone]

## 2016-12-27 NOTE — Discharge Summary (Signed)
Medicine attending discharge note: I personally examined this patient on the day of discharge and I tested the accuracy of the discharge evaluation and plan as recorded in the final progress note dated 12/25/2016 by resident physician Dr. Eulah PontNina Blum.  Clinical summary: 40 year old man well known to me from initial prolonged hospitalization from January 8 through 02/03/2016. Please see attending admission note for complete details. He presented with idiopathic pancreatitis. Imaging studies did not reveal an etiology. He ultimately came to a cholecystectomy on March 8. Pathology showed mild cholecystitis and cholelithiasis. He has suffered a number of complications since that procedure including an intra-abdominal hematoma requiring drainage, a bile leak requiring temporary stent placement,, development of a pancreatic pseudocyst, requiring laparoscopic drainage. Stent removed at that time. Although he has had no recurrent pancreatitis, he has had persistent, fluctuating, abdominal pain. He has an additional area of idiopathic chronic pain, almost radicular in nature, under his right ribs. He was recently started on a trial of amitriptyline 2 weeks ago. He presented at the time of the current admission with increasing abdominal pain, rib pain, nausea, vomiting, and watery diarrhea. Pain at this time primarily in the lower abdominal quadrants and on the right side. No elevation of lipase. Mild elevation of transaminase enzymes but not alkaline phosphatase or bilirubin.  Hospital course: He was put at bowel rest. Given hydration, antiemetics, and analgesics. He had a recent CT scan of the abdomen and pelvis as an outpatient on November 30 and a repeat study on December 23. Appendix appeared somewhat prominent but not inflamed. He had another CT scan during this admission on December 27 which was unchanged compared with the prior studies and showed no obvious acute intra-abdominal pathology. Working diagnosis at  this point is intermittent abdominal pain secondary to adhesion formation from multiple abdominal interventions. He was treated conservatively and although his symptoms did improve, he was still having residual pain at time of discharge. The amitriptyline recently started was continued. Bentyl and Protonix continued. A trial of topical capsacian cream and local heating pad started for the rib discomfort. Dr. Obie DredgeBlum  counseled the patient that he would likely have some chronic abdominal pain.  Disposition: Condition improved at discharge but patient still with symptoms. Follow-up with gastroenterology. Follow-up in our general medical clinic for his hypertension There were no complications

## 2017-01-01 NOTE — Progress Notes (Signed)
Internal Medicine Clinic Attending  Case discussed with Dr. Krall at the time of the visit.  We reviewed the resident's history and exam and pertinent patient test results.  I agree with the assessment, diagnosis, and plan of care documented in the resident's note.  

## 2017-01-05 IMAGING — RF DG ERCP WO/W SPHINCTEROTOMY
1 series · 9 of 9 positions shown · non-contrast
Comparison: Nuclear medicine study 03/14/2016, CT 03/07/2016

CLINICAL DATA: 39-year-old male with a history of ERCP,
choledocholithiasis

EXAM:
ERCP
TECHNIQUE: Multiple spot images obtained with the fluoroscopic device and
submitted for interpretation post-procedure.
FLUOROSCOPY TIME:  Fluoroscopy Time:  1 minutes 21 seconds

[Series 1: run · 9 of 9 slices shown]
[im 1/9]
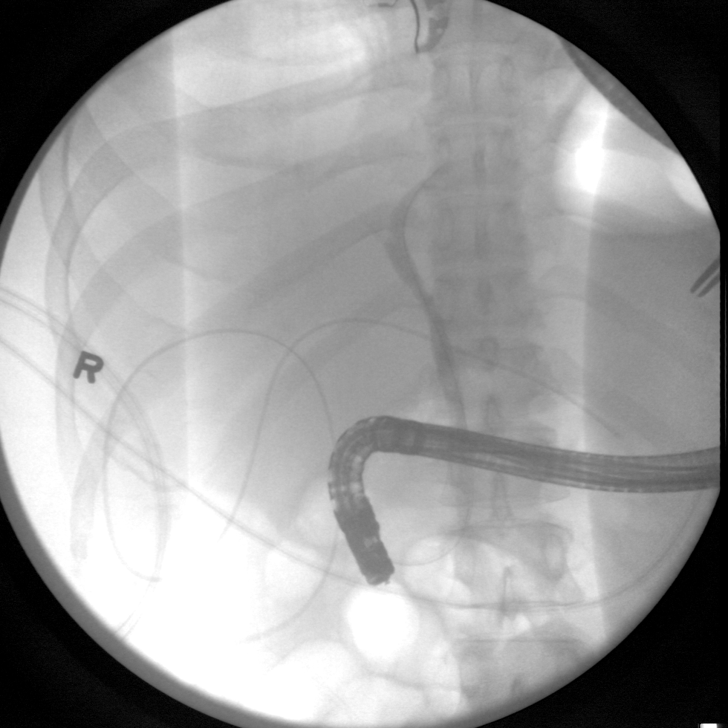
[im 2/9]
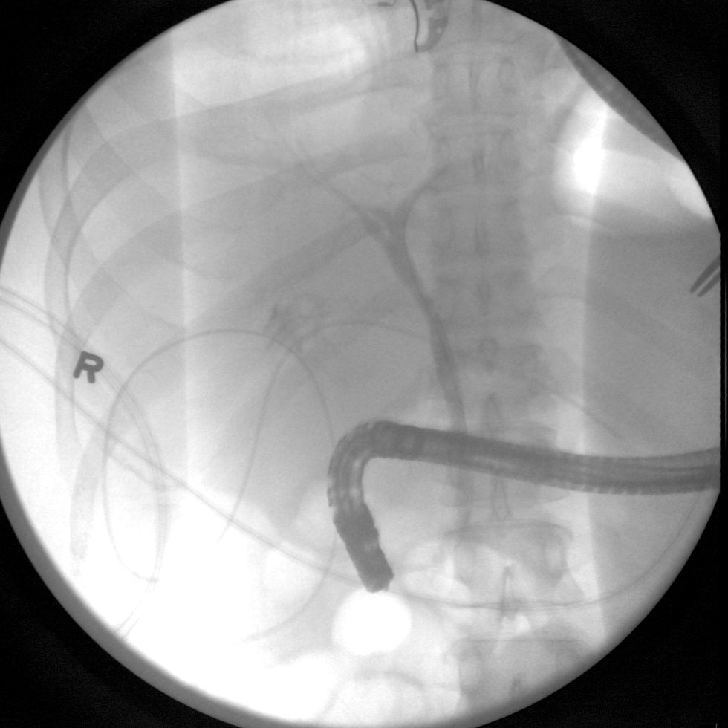
[im 3/9]
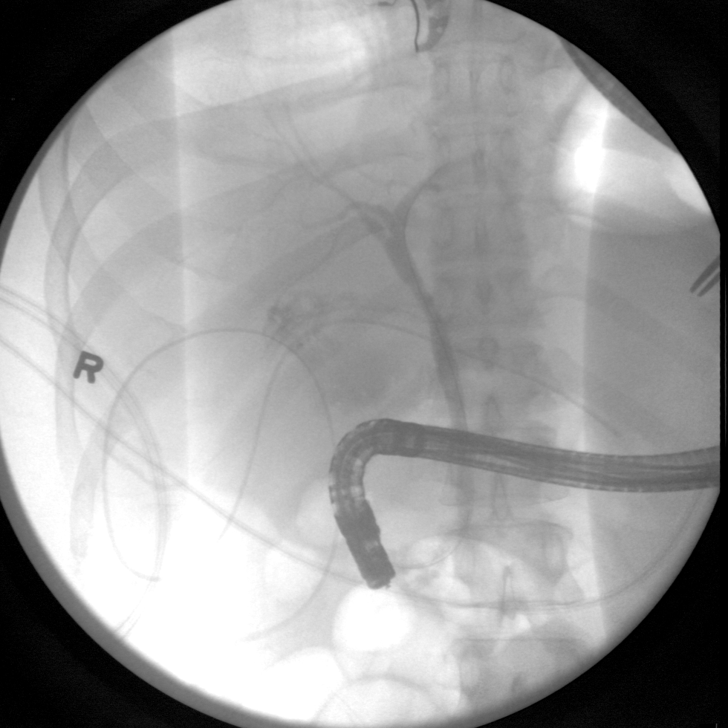
[im 4/9]
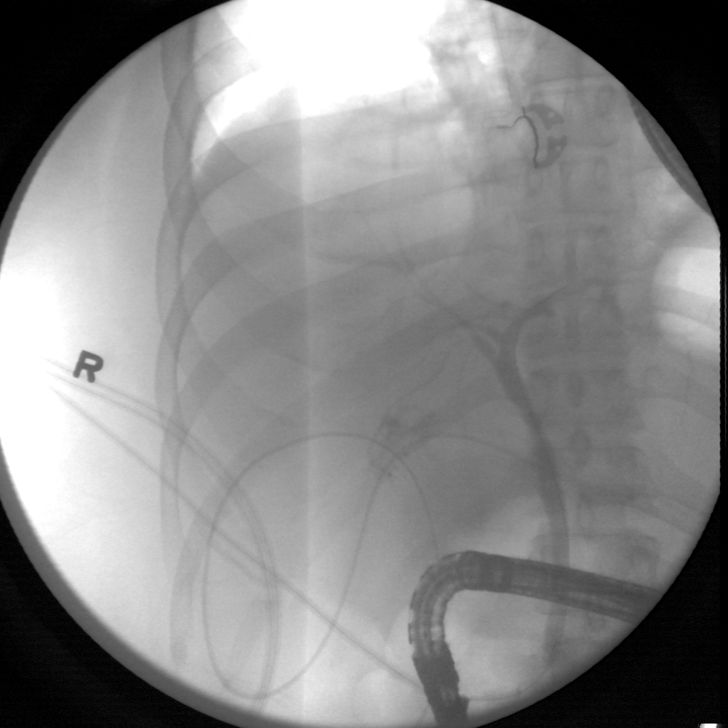
[im 5/9]
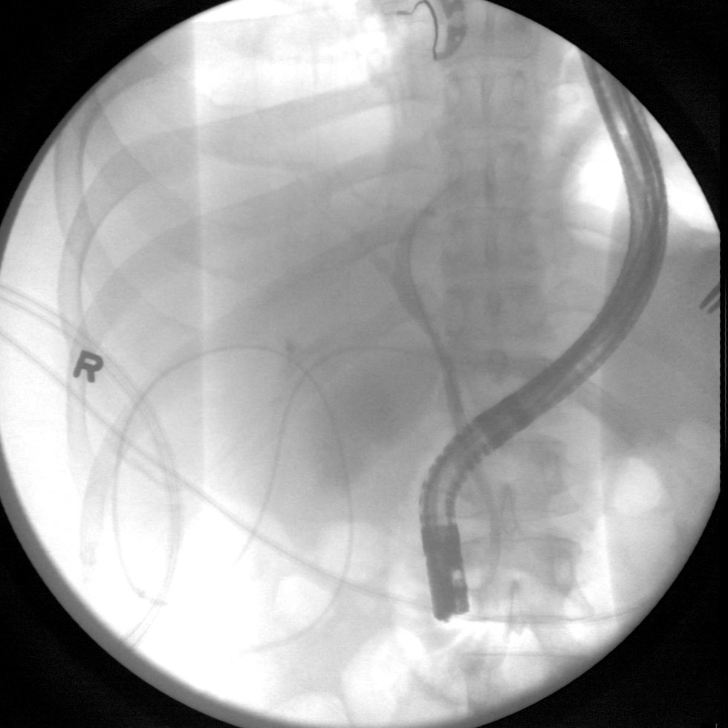
[im 6/9]
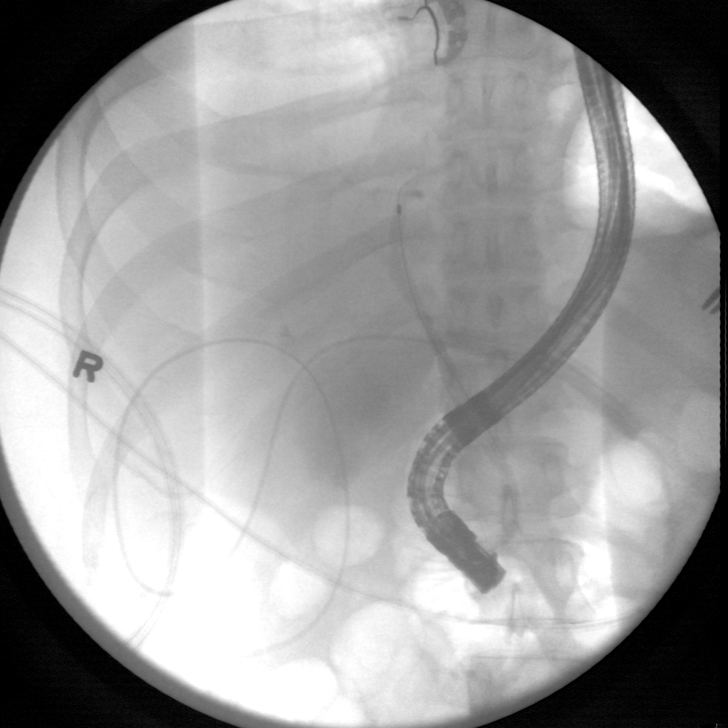
[im 7/9]
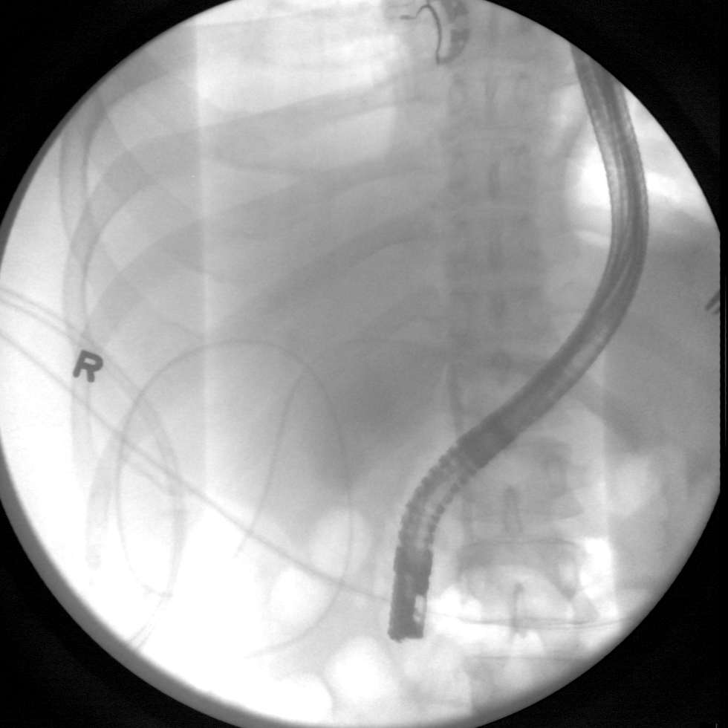
[im 8/9]
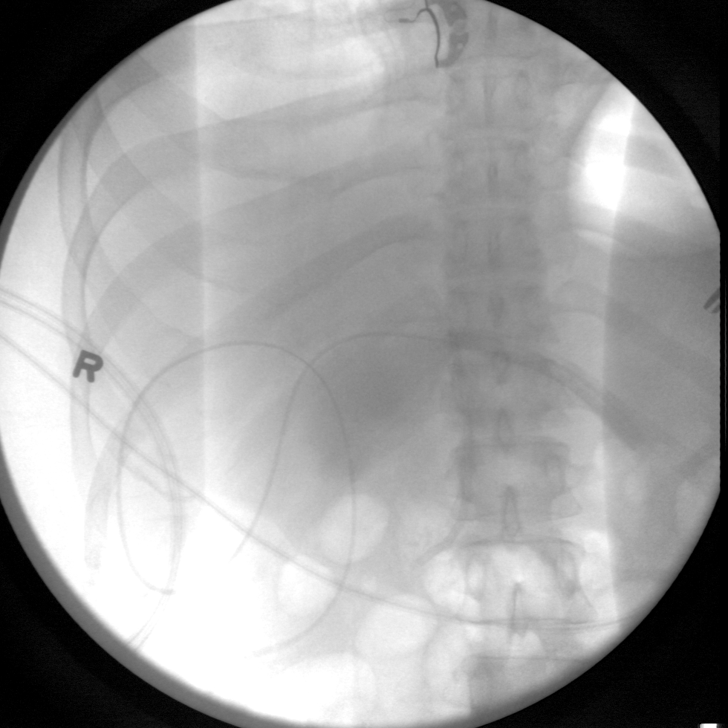
[im 9/9]
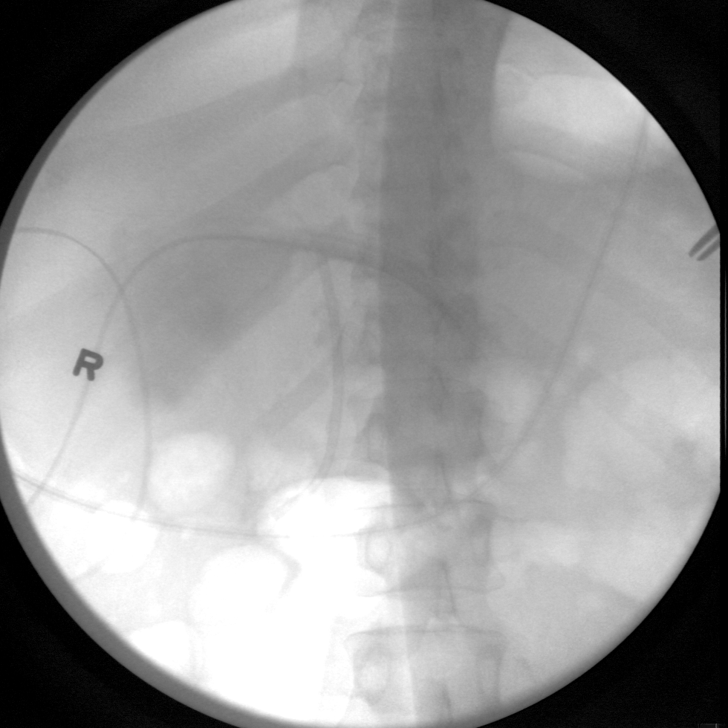

[9 of 9 positions shown; findings below may reference images not displayed]

FINDINGS: Limited fluoroscopic images during ERCP.

Initial image demonstrates endoscope projecting over the upper
abdomen with guidewire cross the ampulla within the biliary system.

There is partial opacification of the extrahepatic biliary ducts.

Final image demonstrates placement of a plastic stent at the distal
common bile duct.
IMPRESSION: Limited images during ERCP demonstrates partial opacification of the
ductal system and placement of a plastic biliary stent at the distal
common bile duct.

Please refer to the dictated operative report for full details of
intraoperative findings and procedure.

## 2017-01-07 ENCOUNTER — Ambulatory Visit (INDEPENDENT_AMBULATORY_CARE_PROVIDER_SITE_OTHER): Payer: BC Managed Care – PPO | Admitting: Internal Medicine

## 2017-01-07 ENCOUNTER — Encounter: Payer: Self-pay | Admitting: Internal Medicine

## 2017-01-07 ENCOUNTER — Other Ambulatory Visit: Payer: BC Managed Care – PPO

## 2017-01-07 VITALS — BP 140/106 | HR 100 | Ht 63.5 in | Wt 166.5 lb

## 2017-01-07 DIAGNOSIS — R1084 Generalized abdominal pain: Secondary | ICD-10-CM | POA: Diagnosis not present

## 2017-01-07 DIAGNOSIS — K8689 Other specified diseases of pancreas: Secondary | ICD-10-CM

## 2017-01-07 NOTE — Patient Instructions (Addendum)
   Your physician has requested that you go to the basement for the following lab work before leaving today: Stool studies  Today we are giving you a work note.  I appreciate the opportunity to care for you. Stan Headarl Gessner, MD, [facility]

## 2017-01-07 NOTE — Progress Notes (Signed)
   Drew DakinRiley Prince 41 y.o. August 02, 1976 [id number]  Assessment & Plan:   Encounter Diagnoses  Name Primary?  . Generalized abdominal pain Yes  . Pancreatic necrosis     Complicated situation - he had terrible pancreatitis with necrosis, bile leak after cholecystectomy, then pancreatic pseudocyst requiring cyst-gastrostomy. I suspect his sxs are multifactorial - some possibilities seem: Scarring/adhesions Pancreatic insufficiency +/- chronic pancreatitis  His sxs are not disabling and I reassured him that based upon info with labs and CT scans doubt anything serious and nothing that needs surgery (i.e. Appendix)   Will continue amitriptyline for now  Could consider duloxetine Will check fecal elastase - if low - pancreatic enzymes and maybe a trial anyway I explained that use of medications is for symptom relief more than anything and that if he can tolerate the sxs (seems likely) may not be worth taking medications daily  Subjective:   Chief Complaint: abdominal pain  HPI  Still having a LUQ pressure and sharper RUQ pain - worse when lying down. Not severe but knows its there. Got admitted to hospital briefly late Dec - another CT abd/pelvis negative except prominent appendix. Says he has soft stools - small at times. Denies greasy, oily changes. Also nauseous. Amitriptyline might help him sleep better - not sure it has helped his pain  Medications, allergies, past medical history, past surgical history, family history and social history are reviewed and updated in the EMR.  Review of Systems As above  Objective:   Physical Exam BP (!) 140/106 (BP Location: Left Arm, Patient Position: Sitting, Cuff Size: Normal)   Pulse 100   Ht 5' 3.5" (1.613 m) Comment: height measured without shoes  Wt 166 lb 8 oz (75.5 kg)   BMI 29.03 kg/m  Eyes anicteric Abdomen - multiple scars - soft - diffusely tender but much less with abdominal wall tension

## 2017-01-08 ENCOUNTER — Telehealth: Payer: Self-pay | Admitting: Internal Medicine

## 2017-01-08 ENCOUNTER — Other Ambulatory Visit: Payer: BC Managed Care – PPO

## 2017-01-08 DIAGNOSIS — K8689 Other specified diseases of pancreas: Secondary | ICD-10-CM

## 2017-01-08 DIAGNOSIS — R1084 Generalized abdominal pain: Secondary | ICD-10-CM

## 2017-01-08 NOTE — Telephone Encounter (Signed)
APT. REMINDER CALL, LMTCB °

## 2017-01-12 ENCOUNTER — Encounter: Payer: Self-pay | Admitting: Internal Medicine

## 2017-01-12 ENCOUNTER — Ambulatory Visit (INDEPENDENT_AMBULATORY_CARE_PROVIDER_SITE_OTHER): Payer: BC Managed Care – PPO | Admitting: Internal Medicine

## 2017-01-12 VITALS — BP 153/116 | HR 102 | Temp 98.0°F | Ht 63.0 in | Wt 169.0 lb

## 2017-01-12 DIAGNOSIS — Z5189 Encounter for other specified aftercare: Secondary | ICD-10-CM

## 2017-01-12 DIAGNOSIS — R1031 Right lower quadrant pain: Secondary | ICD-10-CM

## 2017-01-12 DIAGNOSIS — R1011 Right upper quadrant pain: Secondary | ICD-10-CM

## 2017-01-12 DIAGNOSIS — Z8719 Personal history of other diseases of the digestive system: Secondary | ICD-10-CM

## 2017-01-12 DIAGNOSIS — R1084 Generalized abdominal pain: Secondary | ICD-10-CM

## 2017-01-12 DIAGNOSIS — I10 Essential (primary) hypertension: Secondary | ICD-10-CM

## 2017-01-12 IMAGING — CT CT ABD-PELV W/ CM
2 of 5 series · 15 of 46 positions shown, 17 images · IV contrast (APPLIED)
Comparison: CT of the abdomen and pelvis performed 03/07/2016

CLINICAL DATA: Six days status post ERCP. Recent laparoscopy with
drain placement. Initial encounter.

EXAM:
CT ABDOMEN AND PELVIS WITH CONTRAST
TECHNIQUE: Multidetector CT imaging of the abdomen and pelvis was performed
using the standard protocol following bolus administration of
intravenous contrast.
CONTRAST:  80mL OMNIPAQUE IOHEXOL 300 MG/ML  SOLN

[Series 3: abd/ pelvis 5.0 i30f 1 · axial · 0.76mm/px · z∈[-386,+69]mm · 12 of 103 slices shown, 14 images]
[im 6/103  soft-tissue]
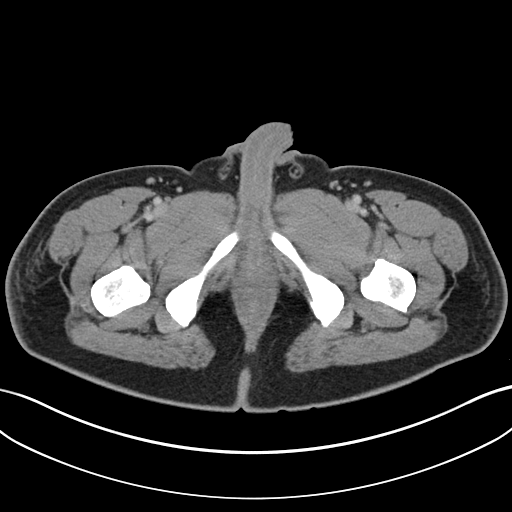
[im 6/103  bone]
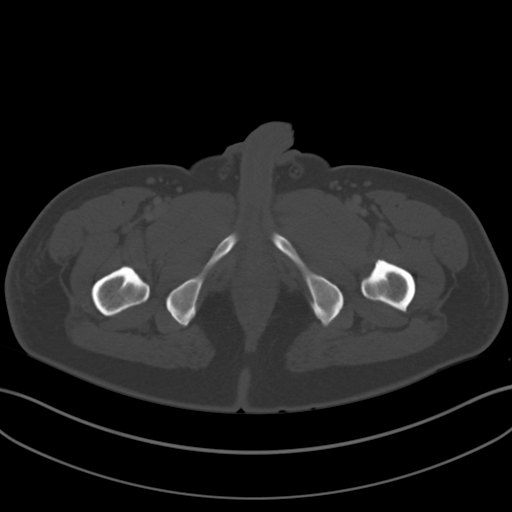
[im 18/103  soft-tissue]
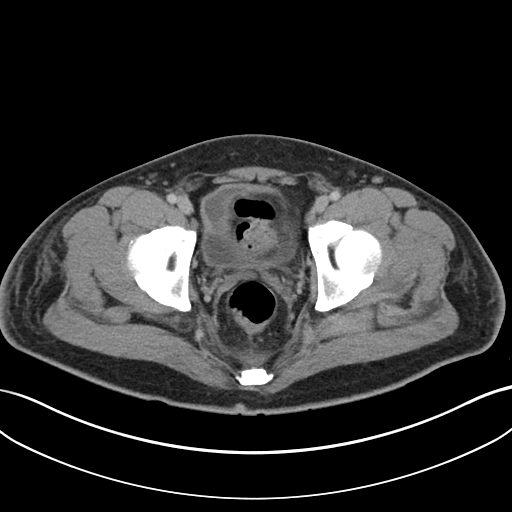
[im 23/103  soft-tissue]
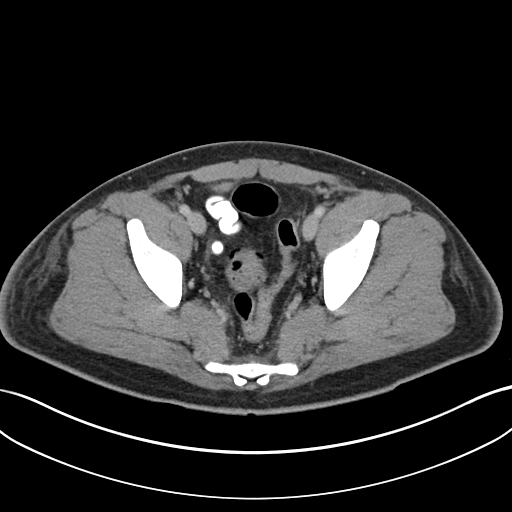
[im 29/103  soft-tissue]
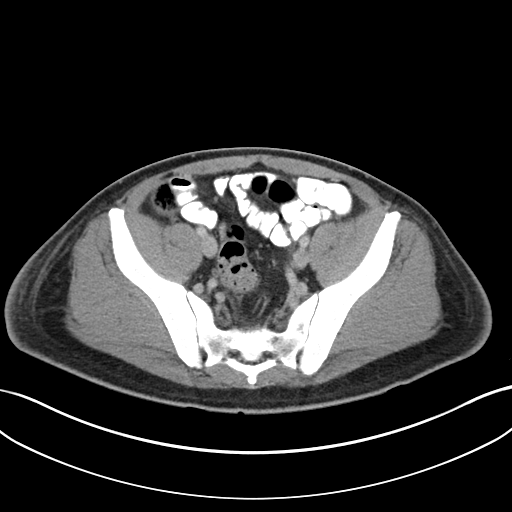
[im 40/103  soft-tissue]
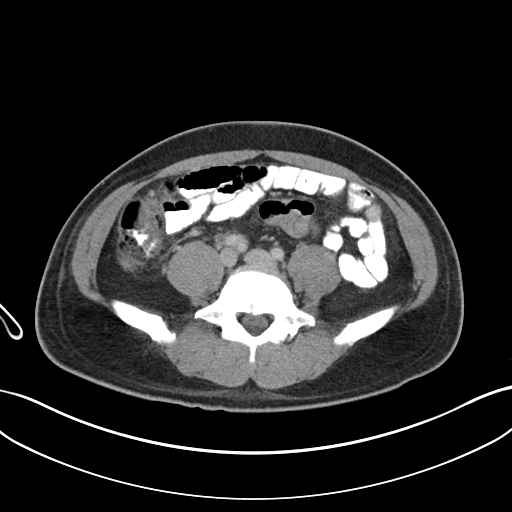
[im 46/103  soft-tissue]
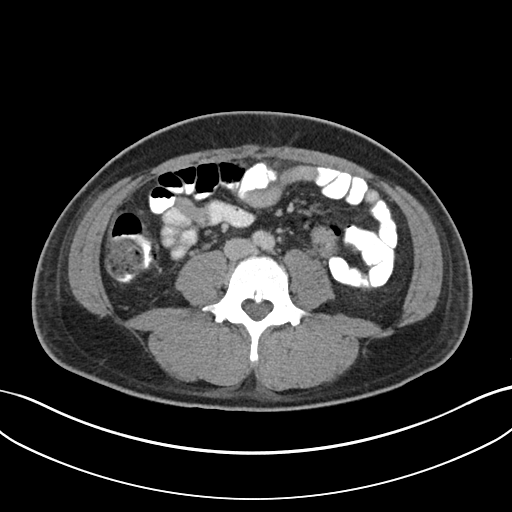
[im 57/103  soft-tissue]
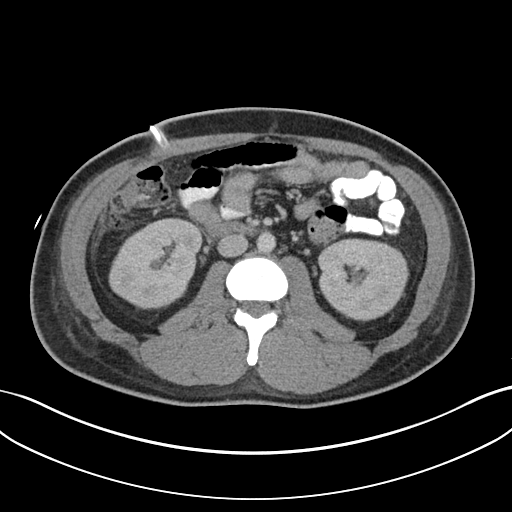
[im 63/103  soft-tissue]
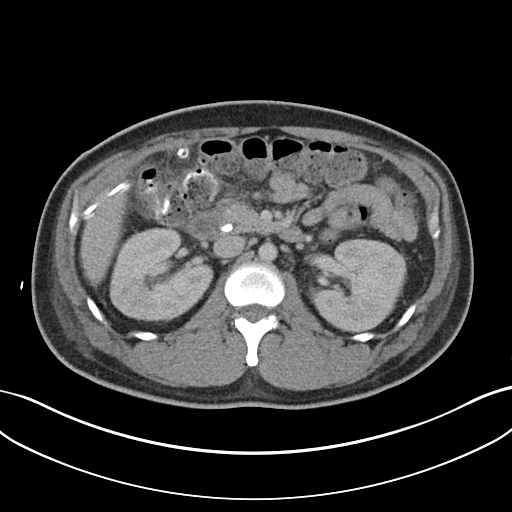
[im 74/103  soft-tissue]
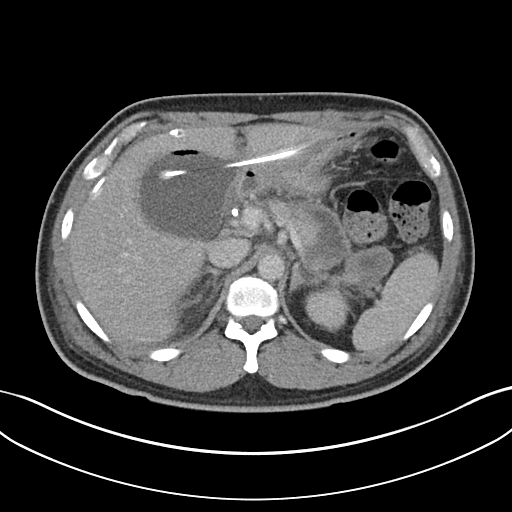
[im 74/103  bone]
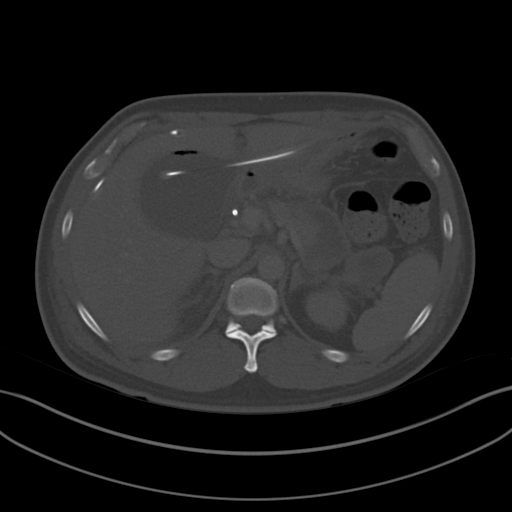
[im 80/103  soft-tissue]
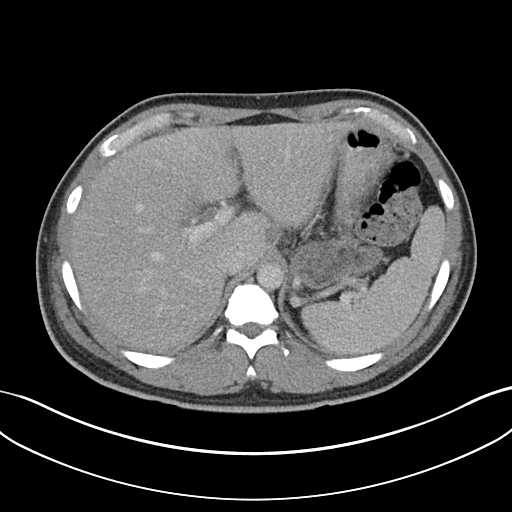
[im 86/103  soft-tissue]
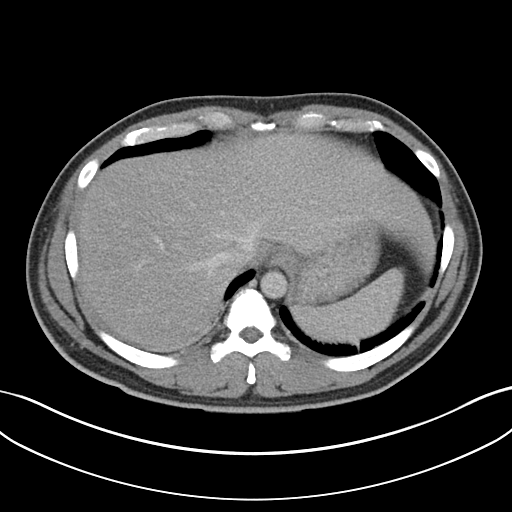
[im 97/103  soft-tissue]
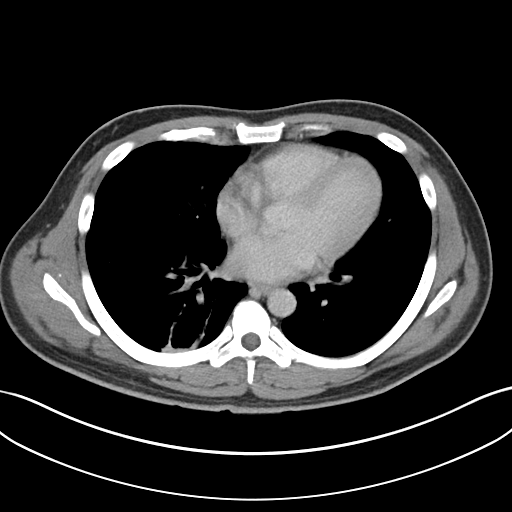

[Series 6: coronal soft tissue · coronal · 0.79mm/px · 3 of 82 slices shown]
[im 28/82  soft-tissue]
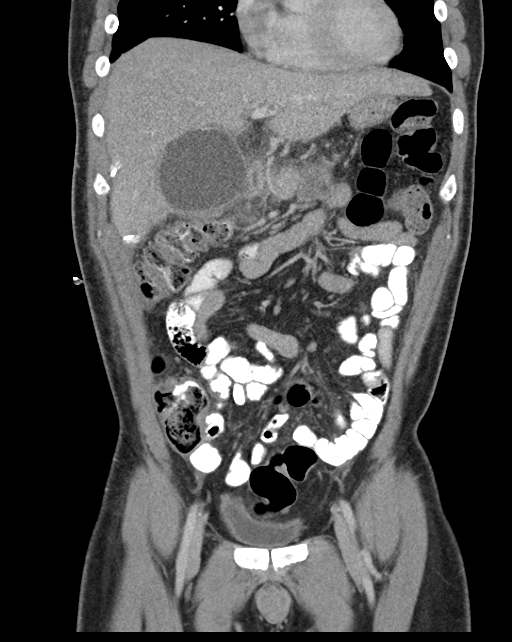
[im 37/82  soft-tissue]
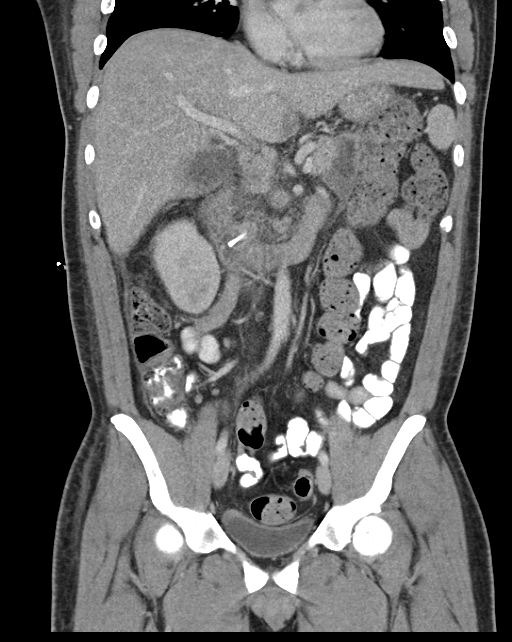
[im 46/82  soft-tissue]
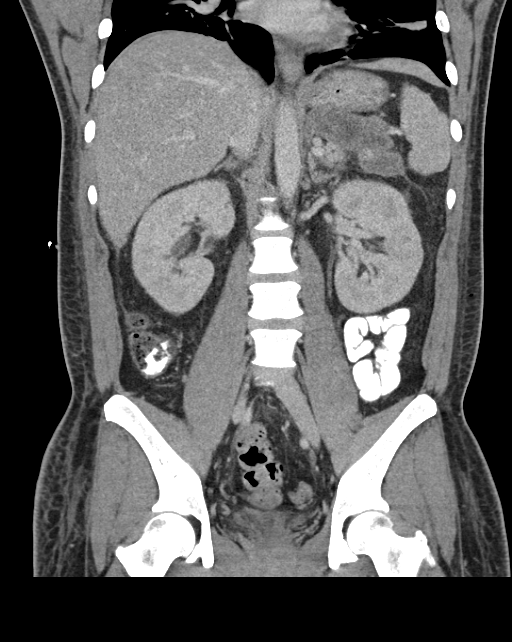

[15 of 46 positions shown; findings below may reference images not displayed]

FINDINGS: Mild bibasilar atelectasis or scarring is noted.

There is a prominent collection of fluid at the gallbladder fossa,
measuring approximately 7.4 x 7.0 x 6.8 cm, with trace associated
air. The patient's drainage catheter passes through the collection,
though it ends more centrally at the mid abdomen. Reaccumulation of
fluid at the gallbladder fossa is suspicious for a bile leak. The
patient's common bile duct stent is noted in expected position.

The previously noted complex pseudocyst along the pancreatic body
and tail is relatively stable from the prior study, measuring
approximately 3.3 cm in width, and 10 cm in length. No significant
soft tissue inflammation is noted about the pancreas at this time.
This abuts the stomach, but does not appear to erode into the
gastric wall, with slight preservation of surrounding fat at this
time.

Mild pneumobilia is noted, likely reflecting common bile duct stent
placement. The liver and spleen are otherwise unremarkable. The
patient is status post cholecystectomy. The adrenal glands are
unremarkable.

A tiny right renal cyst is noted. The kidneys are otherwise
unremarkable. Mild nonspecific perinephric stranding is noted
bilaterally. There is no evidence of hydronephrosis. No renal or
ureteral stones are seen.

The small bowel is unremarkable in appearance. The stomach is within
normal limits. No acute vascular abnormalities are seen.

The appendix is normal in caliber, without evidence of appendicitis.
Contrast progresses to the level of the hepatic flexure of the
colon. The colon is unremarkable in appearance.

The bladder is mildly distended and grossly unremarkable. The
prostate remains normal in size. No inguinal lymphadenopathy is
seen.

No acute osseous abnormalities are identified.
IMPRESSION: 1. Prominent collection of fluid at the gallbladder fossa, measuring
7.4 x 7.0 x 6.8 cm, with trace associated air. Drainage catheter
passes through the collection, though the catheter ends more
centrally at the mid abdomen. Reaccumulation of fluid the
gallbladder fossa is suspicious for a bile leak. Common bile duct
stent noted in expected position.
2. Previously noted complex pseudocyst along the pancreatic body and
tail is relatively stable from the prior study, measuring
approximately 10 cm in length, and 3.3 cm in length. This abuts the
stomach, but does not appear to erode into the gastric wall.
3. Mild pneumobilia likely reflects common bile duct stent
placement.
4. Tiny right renal cyst noted.
5. Mild bibasilar atelectasis or scarring noted.

These results were called by telephone at the time of interpretation
on 03/23/2016 at [DATE] to Dr. NAILOW CASISNO, who verbally
acknowledged these results.

## 2017-01-12 MED ORDER — AMLODIPINE BESYLATE 5 MG PO TABS: 5.0000 mg | ORAL_TABLET | Freq: Every day | ORAL | 1 refills | Status: DC

## 2017-01-12 NOTE — Progress Notes (Signed)
[id number] 

## 2017-01-12 NOTE — Progress Notes (Signed)
   CC: Hospital follow up of abdominal pain  HPI:  [name] is a 41 y.o. man with a history of hypertension, hyperlipidemia, seen in the hospital last year for acute pancreatitis attributed to medications complicated by large pseudocyst formation here then more recently in December presenting today for hospital follow up of abdominal pain. At that hospitalization no new acute process or infection was identified and there is concern some of his problem is due to intraabdominal scarring and adhesions. Since that hospitalization he has seen Dr. Leone PayorGessner in GI clinic and was started on Elavil as a treatment for his continued moderate pain. He is not having any diarrhea or other GI complaints.   See problem based assessment and plan below for additional details  Past Medical History:  Diagnosis Date  . Acute pancreatitis    1-8-17to [facility]03-[facility]  . Bile leak   . Depression   . Diverticulosis   . GERD (gastroesophageal reflux disease)   . H/O seasonal allergies    OTC med used  . Hepatic steatosis   . Hypercholesteremia   . Hypertension    01-05-16 to 02-03-16 Hospitalized for pancreatitis, B/P med discontinued during that time.  . Pancreatic necrosis   . Pancreatic pseudocyst   . PONV (postoperative nausea and vomiting) 03-04-16   ponv after cholecystectomy    Review of Systems:  Review of Systems  Constitutional: Negative for malaise/fatigue and weight loss.  Respiratory: Negative for shortness of breath.   Cardiovascular: Negative for chest pain.  Gastrointestinal: Positive for abdominal pain. Negative for blood in stool, diarrhea and nausea.  Genitourinary: Negative for flank pain and hematuria.  Neurological: Negative for headaches.    Physical Exam: Physical Exam  Constitutional: He is well-developed, well-nourished, and in no distress.  Eyes: Conjunctivae are normal.  Cardiovascular:  Mild tachycardia, regular rhythm, no murmur  Pulmonary/Chest:  Effort normal and breath sounds normal.  Abdominal:  Mild tenderness to palpation worst over the right upper and lower quadrants  Musculoskeletal: He exhibits no edema.  Skin: Skin is warm and dry. No rash noted.  Psychiatric: Affect normal.    Vitals:   01/12/17 1422 01/12/17 1502  BP: (!) 155/105 (!) 153/116  Pulse: (!) 109 (!) 102  Temp: 98 F (36.7 C)   TempSrc: Oral   SpO2: 99%   Weight: 169 lb (76.7 kg)   Height: 5\' 3"  (1.6 m)     Assessment & Plan:   See Encounters Tab for problem based charting.  Patient discussed with Dr. Josem KaufmannKlima

## 2017-01-12 NOTE — Patient Instructions (Addendum)
It was a pleasure to see you today Drew Prince.  For your blood pressure today I would like you to start taking Norvasc (amlodipine) 5mg  once daily. We should try to see you again in clinic within the next month to see that this has improved your blood pressure and that you are not having any negative effects of the medicine.  At that time we can also discuss if your abdominal pain is getting any better.

## 2017-01-13 ENCOUNTER — Encounter: Payer: BC Managed Care – PPO | Admitting: Internal Medicine

## 2017-01-14 LAB — PANCREATIC ELASTASE, FECAL: PANCREATIC ELASTASE-1, STL: 343 ug/g

## 2017-01-14 NOTE — Assessment & Plan Note (Signed)
His abdominal pain continues at about a 5/10 in severity without any associated symptoms. He was started on amitriptyline by GI clinic within the last few weeks and is waiting to see how much benefit this gives. They also checked fecal elastase to assess for pancreatic insufficiency which is pending. I'll not alter this plan today as it is too early to assess the effects.

## 2017-01-14 NOTE — Assessment & Plan Note (Addendum)
Blood pressure uncontrolled today at 155/105. He was previously on lisinopril for hypertension which was discontinued as a suspect in his severe pancreatitis. Based on this thiazide diuretics should also be avoided for a similar albeit rare risk.  -Start amlodipine 5mg  today

## 2017-01-15 NOTE — Progress Notes (Signed)
Case discussed with Dr. Rice at the time of the visit.  We reviewed the resident's history and exam and pertinent patient test results.  I agree with the assessment, diagnosis and plan of care documented in the resident's note. 

## 2017-01-17 NOTE — Progress Notes (Signed)
This test indicates pancreatic function is ok - normal Not sure what I can do otherwise to help his sxs We could try other medications but wonder if he could not give it some more time to see if it does not get better with more healing  Other meds would be trial and error to see if they make a difference by blocking pain signals which is what amitriptyline is for. If we did anything would increase that to 50 mg qhs

## 2017-01-29 ENCOUNTER — Other Ambulatory Visit: Payer: Self-pay | Admitting: Internal Medicine

## 2017-02-01 ENCOUNTER — Ambulatory Visit (INDEPENDENT_AMBULATORY_CARE_PROVIDER_SITE_OTHER): Payer: BC Managed Care – PPO | Admitting: *Deleted

## 2017-02-01 DIAGNOSIS — Z111 Encounter for screening for respiratory tuberculosis: Secondary | ICD-10-CM

## 2017-02-01 DIAGNOSIS — Z0000 Encounter for general adult medical examination without abnormal findings: Secondary | ICD-10-CM | POA: Insufficient documentation

## 2017-02-03 LAB — TB SKIN TEST
Induration: 0 mm
TB Skin Test: NEGATIVE

## 2017-02-07 ENCOUNTER — Encounter: Payer: Self-pay | Admitting: Internal Medicine

## 2017-02-09 ENCOUNTER — Other Ambulatory Visit: Payer: Self-pay | Admitting: Internal Medicine

## 2017-02-09 MED ORDER — MELOXICAM 15 MG PO TABS: 15.0000 mg | ORAL_TABLET | Freq: Every day | ORAL | 2 refills | Status: DC

## 2017-02-17 ENCOUNTER — Encounter: Payer: Self-pay | Admitting: Internal Medicine

## 2017-02-17 ENCOUNTER — Ambulatory Visit (INDEPENDENT_AMBULATORY_CARE_PROVIDER_SITE_OTHER): Payer: BC Managed Care – PPO | Admitting: Internal Medicine

## 2017-02-17 VITALS — BP 132/87 | HR 94 | Temp 98.5°F | Ht 63.0 in | Wt 171.2 lb

## 2017-02-17 DIAGNOSIS — R1084 Generalized abdominal pain: Secondary | ICD-10-CM | POA: Diagnosis not present

## 2017-02-17 DIAGNOSIS — Z8719 Personal history of other diseases of the digestive system: Secondary | ICD-10-CM

## 2017-02-17 DIAGNOSIS — Z8781 Personal history of (healed) traumatic fracture: Secondary | ICD-10-CM

## 2017-02-17 DIAGNOSIS — Z9049 Acquired absence of other specified parts of digestive tract: Secondary | ICD-10-CM

## 2017-02-17 DIAGNOSIS — I10 Essential (primary) hypertension: Secondary | ICD-10-CM | POA: Diagnosis not present

## 2017-02-17 DIAGNOSIS — Z79899 Other long term (current) drug therapy: Secondary | ICD-10-CM | POA: Diagnosis not present

## 2017-02-17 DIAGNOSIS — Z0000 Encounter for general adult medical examination without abnormal findings: Secondary | ICD-10-CM

## 2017-02-17 NOTE — Progress Notes (Signed)
CC: Abdominal Pain  HPI:  [name] is a 41 y.o. male with PMH as listed below who presents for follow up management of his abdominal pain and HTN.  Abdominal Pain: Patient with significant past medical history of severe biliary pancreatitis with necrosis s/p lap chole on 3/817 with post-op bile leak. He developed a pancreatic pseudocyst which did not resolve after several months and went for laparoscopic cyst gastrostomy on 06/10/16. Follow up imaging in December 2017 did not show evidence of pancreatic mass, ductal dilatation, pancreatic or peripancreatic fluid, or inflammatory changes. Patient was admitted from 12/27 - 12/29 in 2017 for abdominal pain thought secondary to surgical adhesions/scar tissue. He does follow with GI, Dr. Leone PayorGessner. Trial of amitriptyline did not provide relief. Fecal pancreatic elastase on 01/08/2017 was normal.   He is currently taking Meloxicam 15 mg daily without significant relief. His current pain is mostly in the left upper abdomen, left chest wall, and left side of his back. He notices the pain is worse with fatty foods or sitting up. Pain seems to be better when he is sitting in a reclined position. He is having at least one normal bowel movement per day. He does have tenderness over his left lateral chest wall and back which is suggestive of a musculoskeletal component as well. He denies any trauma or injury to the area. He has not been on chronic steroids. He reports a remote fracture of his wrist when he was a child after falling off a swing, but no fragility fractures. He denies any nausea, vomiting, diarrhea, constipation, fevers, chills, obvious bleeding, heartburn or reflux, numbness, or tingling.  HTN: Patient currently takes Amlodipine 5 mg daily. His BP this visit is 132/87.  Healthcare maintenance: Patient reports receiving the flu shot at his wife's work in September or October 2017.  Past Medical History:  Diagnosis Date  . Acute  pancreatitis    1-8-17to 02-03-16 [facility]  . Bile leak   . Depression   . Diverticulosis   . GERD (gastroesophageal reflux disease)   . H/O seasonal allergies    OTC med used  . Hepatic steatosis   . Hypercholesteremia   . Hypertension    01-05-16 to 02-03-16 Hospitalized for pancreatitis, B/P med discontinued during that time.  . Pancreatic necrosis   . Pancreatic pseudocyst   . PONV (postoperative nausea and vomiting) 03-04-16   ponv after cholecystectomy    Review of Systems:   Review of Systems  Constitutional: Negative for chills, diaphoresis and fever.  HENT: Negative for nosebleeds.   Respiratory: Negative for cough, hemoptysis and shortness of breath.   Cardiovascular: Negative for chest pain.  Gastrointestinal: Positive for abdominal pain. Negative for blood in stool, constipation, diarrhea, heartburn, melena, nausea and vomiting.       Left sided abdominal and back pain  Genitourinary: Negative for dysuria and hematuria.  Neurological: Negative for tingling and sensory change.  Psychiatric/Behavioral: Negative for substance abuse.     Physical Exam:  Vitals:   02/17/17 1555  BP: 132/87  Pulse: 94  Temp: 98.5 F (36.9 C)  TempSrc: Oral  SpO2: 98%  Weight: 171 lb 3.2 oz (77.7 kg)  Height: 5\' 3"  (1.6 m)   Physical Exam  Constitutional: He is oriented to person, place, and time. He appears well-developed and well-nourished. No distress.  HENT:  Head: Normocephalic and atraumatic.  Cardiovascular: Normal rate and regular rhythm.   No murmur heard. Pulmonary/Chest: Effort normal and breath sounds normal.  Abdominal: Soft. Bowel sounds are normal. He exhibits no distension. There is no guarding.  Mild tenderness in LUQ, LLQ, epigastrium, and RUQ with palpation.  Musculoskeletal:  Tender to palpation over the ribs at the lower left chest wall, lateral chest wall, and left side of the back. No tenderness to spinous processes or paraspinal muscles.     Neurological: He is alert and oriented to person, place, and time.  Skin: Skin is warm. He is not diaphoretic.    Assessment & Plan:   See Encounters Tab for problem based charting.  Patient discussed with Dr. Rogelia Boga

## 2017-02-17 NOTE — Patient Instructions (Addendum)
It was a pleasure to see you again [name].  I think some of your left sided abdominal pain is musculoskeletal in nature. Please try Voltaren gel (Diclofenac gel) to see if this helps. You can try over the counter arthritis creams as well.  You can also try Capsaicin cream for nerve pain.  Your lower abdominal pain may be from adhesions. Unfortunately, I am not sure if there are great medications to help with this.  We can think about trying Drew Prince (Duloxetine) in the future.  Please continue the Meloxicam for now.  Your blood pressure is good today at 132/87. Please continue the Amlodipine 5 mg daily.  Please follow up with me in 6 months or sooner if needed.  Duloxetine delayed-release capsules What is this medicine? DULOXETINE (doo LOX e teen) is used to treat depression, anxiety, and different types of chronic pain. This medicine may be used for other purposes; ask your health care provider or pharmacist if you have questions. COMMON BRAND NAME(S): Drew Prince, Drew Prince What should I tell my health care provider before I take this medicine? They need to know if you have any of these conditions: -bipolar disorder or a family history of bipolar disorder -glaucoma -kidney disease -liver disease -suicidal thoughts or a previous suicide attempt -taken medicines called MAOIs like Carbex, Eldepryl, Marplan, Nardil, and Parnate within 14 days -an unusual reaction to duloxetine, other medicines, foods, dyes, or preservatives -pregnant or trying to get pregnant -breast-feeding How should I use this medicine? Take this medicine by mouth with a glass of water. Follow the directions on the prescription label. Do not cut, crush or chew this medicine. You can take this medicine with or without food. Take your medicine at regular intervals. Do not take your medicine more often than directed. Do not stop taking this medicine suddenly except upon the advice of your doctor. Stopping this  medicine too quickly may cause serious side effects or your condition may worsen. A special MedGuide will be given to you by the pharmacist with each prescription and refill. Be sure to read this information carefully each time. Talk to your pediatrician regarding the use of this medicine in children. While this drug may be prescribed for children as young as 577 years of age for selected conditions, precautions do apply. Overdosage: If you think you have taken too much of this medicine contact a poison control center or emergency room at once. NOTE: This medicine is only for you. Do not share this medicine with others. What if I miss a dose? If you miss a dose, take it as soon as you can. If it is almost time for your next dose, take only that dose. Do not take double or extra doses. What may interact with this medicine? Do not take this medicine with any of the following medications: -desvenlafaxine -levomilnacipran -linezolid -MAOIs like Carbex, Eldepryl, Marplan, Nardil, and Parnate -methylene blue (injected into a vein) -milnacipran -thioridazine -venlafaxine This medicine may also interact with the following medications: -alcohol -amphetamines -aspirin and aspirin-like medicines -certain antibiotics like ciprofloxacin and enoxacin -certain medicines for blood pressure, heart disease, irregular heart beat -certain medicines for depression, anxiety, or psychotic disturbances -certain medicines for migraine headache like almotriptan, eletriptan, frovatriptan, naratriptan, rizatriptan, sumatriptan, zolmitriptan -certain medicines that treat or prevent blood clots like warfarin, enoxaparin, and dalteparin -cimetidine -fentanyl -lithium -NSAIDS, medicines for pain and inflammation, like ibuprofen or naproxen -phentermine -procarbazine -rasagiline -sibutramine -St. John's wort -theophylline -tramadol -tryptophan This list may not describe all possible  interactions. Give your health  care provider a list of all the medicines, herbs, non-prescription drugs, or dietary supplements you use. Also tell them if you smoke, drink alcohol, or use illegal drugs. Some items may interact with your medicine. What should I watch for while using this medicine? Tell your doctor if your symptoms do not get better or if they get worse. Visit your doctor or health care professional for regular checks on your progress. Because it may take several weeks to see the full effects of this medicine, it is important to continue your treatment as prescribed by your doctor. Patients and their families should watch out for new or worsening thoughts of suicide or depression. Also watch out for sudden changes in feelings such as feeling anxious, agitated, panicky, irritable, hostile, aggressive, impulsive, severely restless, overly excited and hyperactive, or not being able to sleep. If this happens, especially at the beginning of treatment or after a change in dose, call your health care professional. Drew Prince may get drowsy or dizzy. Do not drive, use machinery, or do anything that needs mental alertness until you know how this medicine affects you. Do not stand or sit up quickly, especially if you are an older patient. This reduces the risk of dizzy or fainting spells. Alcohol may interfere with the effect of this medicine. Avoid alcoholic drinks. This medicine can cause an increase in blood pressure. This medicine can also cause a sudden drop in your blood pressure, which may make you feel faint and increase the chance of a fall. These effects are most common when you first start the medicine or when the dose is increased, or during use of other medicines that can cause a sudden drop in blood pressure. Check with your doctor for instructions on monitoring your blood pressure while taking this medicine. Your mouth may get dry. Chewing sugarless gum or sucking hard candy, and drinking plenty of water may help. Contact your  doctor if the problem does not go away or is severe. What side effects may I notice from receiving this medicine? Side effects that you should report to your doctor or health care professional as soon as possible: -allergic reactions like skin rash, itching or hives, swelling of the face, lips, or tongue -anxious -breathing problems -confusion -changes in vision -chest pain -confusion -elevated mood, decreased need for sleep, racing thoughts, impulsive behavior -eye pain -fast, irregular heartbeat -feeling faint or lightheaded, falls -feeling agitated, angry, or irritable -hallucination, loss of contact with reality -high blood pressure -loss of balance or coordination -palpitations -redness, blistering, peeling or loosening of the skin, including inside the mouth -restlessness, pacing, inability to keep still -seizures -stiff muscles -suicidal thoughts or other mood changes -trouble passing urine or change in the amount of urine -trouble sleeping -unusual bleeding or bruising -unusually weak or tired -vomiting -yellowing of the eyes or skin Side effects that usually do not require medical attention (report to your doctor or health care professional if they continue or are bothersome): -change in sex drive or performance -change in appetite or weight -constipation -dizziness -dry mouth -headache -increased sweating -nausea -tired This list may not describe all possible side effects. Call your doctor for medical advice about side effects. You may report side effects to FDA at 1-800-FDA-1088. Where should I keep my medicine? Keep out of the reach of children. Store at room temperature between 20 and 25 degrees C (68 to 77 degrees F). Throw away any unused medicine after the expiration date. NOTE: This sheet  is a summary. It may not cover all possible information. If you have questions about this medicine, talk to your doctor, pharmacist, or health care provider.  2017  Elsevier/Gold Standard (2016-05-14 18:16:03)

## 2017-02-19 NOTE — Assessment & Plan Note (Signed)
Patient reports receiving the flu shot at his wife's work in September or October 2017.

## 2017-02-19 NOTE — Assessment & Plan Note (Signed)
Patient currently takes Amlodipine 5 mg daily. His BP this visit is 132/87.  His BP is well controlled now. He is not on Lisinopril as this was considered a possible cause for his pancreatitis. We can increase his Amlodipine if needed.

## 2017-02-19 NOTE — Assessment & Plan Note (Signed)
Patient with significant past medical history of severe biliary pancreatitis with necrosis s/p lap chole on 3/817 with post-op bile leak. He developed a pancreatic pseudocyst which did not resolve after several months and went for laparoscopic cyst gastrostomy on 06/10/16. Follow up imaging in December 2017 did not show evidence of pancreatic mass, ductal dilatation, pancreatic or peripancreatic fluid, or inflammatory changes. Patient was admitted from 12/27 - 12/29 in 2017 for abdominal pain thought secondary to surgical adhesions/scar tissue. He does follow with GI, Dr. Leone PayorGessner. Trial of amitriptyline did not provide relief. Fecal pancreatic elastase on 01/08/2017 was normal.   He is currently taking Meloxicam 15 mg daily without significant relief. His current pain is mostly in the left upper abdomen, left chest wall, and left side of his back. He notices the pain is worse with fatty foods or sitting up. Pain seems to be better when he is sitting in a reclined position. He is having at least one normal bowel movement per day. He does have tenderness over his left lateral chest wall and back which is suggestive of a musculoskeletal component as well. He denies any trauma or injury to the area. He has not been on chronic steroids. He reports a remote fracture of his wrist when he was a child after falling off a swing, but no fragility fractures. He denies any nausea, vomiting, diarrhea, constipation, fevers, chills, obvious bleeding, heartburn or reflux, numbness, or tingling.  Patient with continued chronic abdominal pain. He has no signs/symptoms of acute pancreatitis this visit or underlying infection. I think this may be related to adhesions/scar tissue and that he will have continued chronic pain without being completely pain-free. I discussed with the patient that we will try to control his pain to help him do his activities of daily living. He does not appear to have any functional deficit at this time  secondary to pain. We also discussed thinking about trying Duloxetine as suggested by Dr. Leone PayorGessner, and he wants to think about this.  For now, we will continue the Meloxicam 15 mg daily. He will try Voltaren gel for his chest wall soreness in addition to the oral NSAID. We will consider Duloxetine as another option. He will let us know if he develops any fevers, chills, diaphoresis, or severe abdominal pain.

## 2017-02-22 NOTE — Progress Notes (Signed)
Internal Medicine Clinic Attending  Case discussed with Dr. Patel,Rushil at the time of the visit.  We reviewed the resident's history and exam and pertinent patient test results.  I agree with the assessment, diagnosis, and plan of care documented in the resident's note.  

## 2017-03-02 ENCOUNTER — Encounter: Payer: Self-pay | Admitting: Internal Medicine

## 2017-03-21 ENCOUNTER — Encounter: Payer: Self-pay | Admitting: Internal Medicine

## 2017-03-24 ENCOUNTER — Ambulatory Visit (INDEPENDENT_AMBULATORY_CARE_PROVIDER_SITE_OTHER): Payer: BC Managed Care – PPO | Admitting: Internal Medicine

## 2017-03-24 VITALS — BP 154/108 | HR 81 | Temp 98.4°F | Ht 64.0 in | Wt 174.9 lb

## 2017-03-24 DIAGNOSIS — R252 Cramp and spasm: Secondary | ICD-10-CM

## 2017-03-24 DIAGNOSIS — R208 Other disturbances of skin sensation: Secondary | ICD-10-CM

## 2017-03-24 DIAGNOSIS — I10 Essential (primary) hypertension: Secondary | ICD-10-CM | POA: Diagnosis not present

## 2017-03-24 DIAGNOSIS — R209 Unspecified disturbances of skin sensation: Secondary | ICD-10-CM | POA: Insufficient documentation

## 2017-03-24 MED ORDER — BACLOFEN 10 MG PO TABS: 5.0000 mg | ORAL_TABLET | Freq: Three times a day (TID) | ORAL | 0 refills | Status: DC

## 2017-03-24 MED ORDER — METOPROLOL SUCCINATE ER 50 MG PO TB24: 50.0000 mg | ORAL_TABLET | Freq: Every day | ORAL | 1 refills | Status: DC

## 2017-03-24 NOTE — Assessment & Plan Note (Addendum)
Vitals:   03/24/17 1517  BP: (!) 154/108  Pulse: 81  Temp: 98.4 F (36.9 C)   BP is elevated likely due to not taking any amlodipine. I don't think amlodipine is the cause of the cramps and his hand coolness as that's still persisting. CCB should be helping Raynaud's if he had it. But since he is worried I will switch him to bblocker - start metoprolol 50 mg XL -f/up in 1 month.

## 2017-03-24 NOTE — Assessment & Plan Note (Signed)
Unclear cause of leg cramp. Very low suspicion for DVT. Wells DVT score of zero. Normal exam. No assymetry, no erythema, or swelling. No signs of foot pathology on exam.   - will try baclofen for symptomatic control.

## 2017-03-24 NOTE — Progress Notes (Signed)
   CC: hand feeling cold and left leg cramp  HPI:  Drew Prince is a 41 y.o. pmh as listed below is here for hand cold and pale and left leg cramp.   Amlodipine started on 01/12/17 visit by Dr. Dimple Caseyice. Stopped taking amlodipine on march 6th after contacting Dr. Allena KatzPatel about leg cramps. Leg cramps started few days before contacting Dr. Allena KatzPatel. Since stopping, no improvement of the leg cramps. Has leg cramp starting on calf and radiation to the left thigh. Now mainly on calf and left feet. [facility] [place] [facility] [place] ago but cramping happened before that.   Also had cold sensation (usually not cold) since January, has brief periods of paleness and bluish tone which resolves quickly. No hx of Raynaud's. The feeling of coldness is persistent, even now although he does not have paleness currently.   HTN - was on amlodipine but stopped due to leg cramps. Had lisinopril in the past which was thought to be the cause of pancreatitis.    Past Medical History:  Diagnosis Date  . Acute pancreatitis    1-8-17to 02-03-16 [facility].  . Bile leak   . Depression   . Diverticulosis   . GERD (gastroesophageal reflux disease)   . H/O seasonal allergies    OTC med used  . Hepatic steatosis   . Hypercholesteremia   . Hypertension    01-05-16 to 02-03-16 Hospitalized for pancreatitis, B/P med discontinued during that time.  . Pancreatic necrosis   . Pancreatic pseudocyst   . PONV (postoperative nausea and vomiting) 03-04-16   ponv after cholecystectomy    Review of Systems:    Review of Systems  Respiratory: Negative for cough and shortness of breath.   Cardiovascular: Negative for chest pain and leg swelling.  Gastrointestinal: Negative for heartburn, nausea and vomiting.  Musculoskeletal: Negative for joint pain.     Physical Exam:  Vitals:   03/24/17 1517  BP: (!) 154/108  Pulse: 81  Temp: 98.4 F (36.9 C)  TempSrc: Oral  SpO2: 100%  Weight: 174 lb  14.4 oz (79.3 kg)  Height: 5\' 4"  (1.626 m)   Physical Exam  Constitutional: He is oriented to person, place, and time. He appears well-developed and well-nourished. No distress.  Cardiovascular: Exam reveals no gallop and no friction rub.   No murmur heard. Respiratory: Effort normal and breath sounds normal. No respiratory distress. He has no wheezes.  Musculoskeletal:  No edema, no tenderness, no erythema, both legs are equal in size. 2+ DP pulses.   Both hands appear normal toned, no paleness or cyanosis. No ulcers, 2+ radial pulses. No tenderness.   Neurological: He is alert and oriented to person, place, and time.  Skin: He is not diaphoretic.  Psychiatric: He has a normal mood and affect.    Assessment & Plan:   See Encounters Tab for problem based charting.  Patient discussed with Dr. Cyndie ChimeGranfortuna

## 2017-03-24 NOTE — Assessment & Plan Note (Addendum)
Unclear etiology of hand feeling cold. Has normal skin tone and pulses. His episodes of mild paleness and blue/purplish color of the finger only lasted 1-2 mins (not 15-20 mins seen on Raynauds). He does mention feeling colder than other people in a room. Has no signs of ischemia or cyanosis on exam. Good radial pulses. Normal tone. Very low suspicion for Raynaud's especially while he was on CCB amlodipine.  No family hx or personal hx of autoimmune problems.  -will check TSH to make sure he is not hypothyroid - continue to monitor for symptoms otherwise.

## 2017-03-24 NOTE — Progress Notes (Signed)
Medicine attending: Medical history, presenting problems, physical findings, and medications, reviewed with resident physician Dr Tasrif Ahmed on the day of the patient visit and I concur with his evaluation and management plan. 

## 2017-03-24 NOTE — Patient Instructions (Signed)
Start taking baclofen for your leg cramps.  Will check your thyroid lab today.  Take metoprolol 50mg  XL daily for your blood pressure  Follow up in 1 month.

## 2017-03-25 LAB — TSH: TSH: 1.42 u[IU]/mL (ref 0.450–4.500)

## 2017-04-19 ENCOUNTER — Ambulatory Visit: Payer: BC Managed Care – PPO

## 2017-04-19 ENCOUNTER — Telehealth: Payer: Self-pay | Admitting: Internal Medicine

## 2017-04-19 NOTE — Telephone Encounter (Signed)
APT. REMINDER CALL, LMTCB °

## 2017-04-20 ENCOUNTER — Ambulatory Visit (INDEPENDENT_AMBULATORY_CARE_PROVIDER_SITE_OTHER): Payer: BC Managed Care – PPO | Admitting: Pulmonary Disease

## 2017-04-20 ENCOUNTER — Encounter: Payer: Self-pay | Admitting: Pulmonary Disease

## 2017-04-20 VITALS — BP 162/101 | HR 75 | Temp 98.0°F | Ht 64.0 in | Wt 179.9 lb

## 2017-04-20 DIAGNOSIS — I10 Essential (primary) hypertension: Secondary | ICD-10-CM | POA: Diagnosis not present

## 2017-04-20 DIAGNOSIS — R252 Cramp and spasm: Secondary | ICD-10-CM

## 2017-04-20 MED ORDER — METOPROLOL SUCCINATE ER 25 MG PO TB24: 75.0000 mg | ORAL_TABLET | Freq: Every day | ORAL | 1 refills | Status: DC

## 2017-04-20 MED ORDER — METOPROLOL SUCCINATE ER 100 MG PO TB24: 100.0000 mg | ORAL_TABLET | Freq: Every day | ORAL | 1 refills | Status: DC

## 2017-04-20 NOTE — Progress Notes (Signed)
   CC: Hypertension follow up  HPI:  [name] is a 41 y.o. man with history as noted below here for follow up of hypertension.   He is tolerating his new medication without any issues.  Leg cramping started a couple of months ago. Thought it was due to amlodipine because it started after starting the amlodipine. Cramping is in the calves and sometimes goes up to hamstring area. Baclofen has not helped. Started on L side and now it is on both sides. Occurs throughout the day. Not more at night. Sitting for long periods of time makes it worse. No problems with the new beta blocker. Cold hands occurs less often.   Past Medical History:  Diagnosis Date  . Acute pancreatitis    1-8-17to 02-03-16 Hospital stay at [facility].  . Bile leak   . Depression   . Diverticulosis   . GERD (gastroesophageal reflux disease)   . H/O seasonal allergies    OTC med used  . Hepatic steatosis   . Hypercholesteremia   . Hypertension    01-05-16 to 02-03-16 Hospitalized for pancreatitis, B/P med discontinued during that time.  . Pancreatic necrosis   . Pancreatic pseudocyst   . PONV (postoperative nausea and vomiting) 03-04-16   ponv after cholecystectomy    Review of Systems:   No fevers or chills, no weight loss No dyspnea  Physical Exam:  Vitals:   04/20/17 1552 04/20/17 1637  BP: (!) 160/93 (!) 162/101  Pulse: 73 75  Temp: 98 F (36.7 C)   TempSrc: Oral   SpO2: 99%   Weight: 179 lb 14.4 oz (81.6 kg)   Height:  (1.626 m)    General Apperance: NAD HEENT: Normocephalic, atraumatic, anicteric sclera Neck: Supple, trachea midline Lungs: Clear to auscultation bilaterally. No wheezes, rhonchi or rales. Breathing comfortably Heart: Regular rate and rhythm, no murmur/rub/gallop Abdomen: Soft, nontender, nondistended, no rebound/guarding Extremities: Warm and well perfused, no edema, nontender to palpation, 2+ DP pulses bilaterally Skin: No rashes or lesions Neurologic: Alert and  interactive. No gross deficits.   Assessment & Plan:   See Encounters Tab for problem based charting.  Patient discussed with Dr. Rogelia Boga

## 2017-04-20 NOTE — Patient Instructions (Addendum)
If you have an acute leg cramp you should try to stretch the affected muscle  Other measures that may offer relief from the acute cramp include:  ?Walking or leg jiggling followed by leg elevation  ?A hot shower with the stream directed at the cramp area of the body, usually for five minutes, or a warm tub bath  ?Ice massage  Take the new prescription for your blood pressure. It is three tablets (  total) daily of metoprolol.  Follow up in 4-6 weeks  Leg Cramps Leg cramps occur when a muscle or muscles tighten and you have no control over this tightening (involuntary muscle contraction). Muscle cramps can develop in any muscle, but the most common place is in the calf muscles of the leg. Those cramps can occur during exercise or when you are at rest. Leg cramps are painful, and they may last for a few seconds to a few minutes. Cramps may return several times before they finally stop. Usually, leg cramps are not caused by a serious medical problem. In many cases, the cause is not known. Some common causes include:  Overexertion.  Overuse from repetitive motions, or doing the same thing over and over.  Remaining in a certain position for a long period of time.  Improper preparation, form, or technique while performing a sport or an activity.  Dehydration.  Injury.  Side effects of some medicines.  Abnormally low levels of the salts and ions in your blood (electrolytes), especially potassium and calcium. These levels could be low if you are taking water pills (diuretics) or if you are pregnant. Follow these instructions at home: Watch your condition for any changes. Taking the following actions may help to lessen any discomfort that you are feeling:  Stay well-hydrated. Drink enough fluid to keep your urine clear or pale yellow.  Try massaging, stretching, and relaxing the affected muscle. Do this for several minutes at a time.  For tight or tense muscles, use a warm towel,  heating pad, or hot shower water directed to the affected area.  If you are sore or have pain after a cramp, applying ice to the affected area may relieve discomfort.  Put ice in a plastic bag.  Place a towel between your skin and the bag.  Leave the ice on for 20 minutes, 2-3 times per day.  Avoid strenuous exercise for several days if you have been having frequent leg cramps.  Make sure that your diet includes the essential minerals for your muscles to work normally.  Take medicines only as directed by your health care provider. Contact a health care provider if:  Your leg cramps get more severe or more frequent, or they do not improve over time.  Your foot becomes cold, numb, or blue. This information is not intended to replace advice given to you by your health care provider. Make sure you discuss any questions you have with your health care provider. Document Released: 01/21/2005 Document Revised: 05/21/2016 Document Reviewed: 11/21/2014 Elsevier Interactive Patient Education  2017 [employer].

## 2017-04-21 ENCOUNTER — Telehealth: Payer: Self-pay

## 2017-04-21 LAB — VITAMIN D 25 HYDROXY (VIT D DEFICIENCY, FRACTURES): Vit D, 25-Hydroxy: 32.7 ng/mL (ref 30.0–100.0)

## 2017-04-21 LAB — BMP8+ANION GAP
ANION GAP: 19 mmol/L — AB (ref 10.0–18.0)
BUN / CREAT RATIO: 20 (ref 9–20)
BUN: 19 mg/dL (ref 6–24)
CO2: 23 mmol/L (ref 18–29)
Calcium: 9.9 mg/dL (ref 8.7–10.2)
Chloride: 99 mmol/L (ref 96–106)
Creatinine, Ser: 0.95 mg/dL (ref 0.76–1.27)
GFR calc Af Amer: 114 mL/min/{1.73_m2} (ref >59)
GFR calc non Af Amer: 99 mL/min/{1.73_m2} (ref >59)
GLUCOSE: 93 mg/dL (ref 65–99)
Potassium: 4.5 mmol/L (ref 3.5–5.2)
SODIUM: 141 mmol/L (ref 134–144)

## 2017-04-21 NOTE — Assessment & Plan Note (Signed)
Assessment: Having bilateral calf cramping that has not improved since stopping amlodipine. BMP and vitamin D level normal.   Plan: Patient given stretching exercises and instructed to soak muscles in warm water. Can include epsom salt in soaks.

## 2017-04-21 NOTE — Assessment & Plan Note (Addendum)
Assessment: BP 160/93.  Plan: Increase metoprolol succinate to  daily Follow up in 4-6 weeks

## 2017-04-21 NOTE — Telephone Encounter (Signed)
  Left voice mail that lab work was normal and to continue stretching and hot water soaks to calf muscle to call back if he has questions or concerns

## 2017-04-23 NOTE — Progress Notes (Signed)
Internal Medicine Clinic Attending  Case discussed with Dr. Krall at the time of the visit.  We reviewed the resident's history and exam and pertinent patient test results.  I agree with the assessment, diagnosis, and plan of care documented in the resident's note.  

## 2017-05-03 ENCOUNTER — Encounter: Payer: Self-pay | Admitting: Pulmonary Disease

## 2017-05-03 NOTE — Progress Notes (Signed)
Vitamin D and BMP normal. Results sent to patient.

## 2017-05-11 ENCOUNTER — Encounter: Payer: Self-pay | Admitting: Internal Medicine

## 2017-05-11 ENCOUNTER — Other Ambulatory Visit: Payer: Self-pay | Admitting: Internal Medicine

## 2017-05-12 NOTE — Telephone Encounter (Signed)
Tx for GERD will refill.  Also of note I see that he has been having leg cramping, while his Hgb is normal at last check his MCV appears to be decreasing this could be a sign of IDA which if confirmed would need to be worked up.  Will send message to Dr Terrilee CroakV Patel to consider checking ferritin and repeat CBC at next visit.

## 2017-05-13 ENCOUNTER — Other Ambulatory Visit: Payer: Self-pay | Admitting: Internal Medicine

## 2017-05-13 DIAGNOSIS — M792 Neuralgia and neuritis, unspecified: Secondary | ICD-10-CM

## 2017-05-13 MED ORDER — DULOXETINE HCL 30 MG PO CPEP: 60.0000 mg | ORAL_CAPSULE | Freq: Every day | ORAL | 2 refills | Status: DC

## 2017-06-08 ENCOUNTER — Encounter: Payer: Self-pay | Admitting: Internal Medicine

## 2017-06-22 ENCOUNTER — Other Ambulatory Visit: Payer: Self-pay | Admitting: Pulmonary Disease

## 2017-06-22 DIAGNOSIS — I10 Essential (primary) hypertension: Secondary | ICD-10-CM

## 2017-06-24 ENCOUNTER — Other Ambulatory Visit: Payer: Self-pay | Admitting: Internal Medicine

## 2017-06-24 DIAGNOSIS — R140 Abdominal distension (gaseous): Secondary | ICD-10-CM

## 2017-06-24 MED ORDER — METOCLOPRAMIDE HCL 5 MG PO TABS: 5.0000 mg | ORAL_TABLET | Freq: Three times a day (TID) | ORAL | 1 refills | Status: DC

## 2017-06-24 NOTE — Progress Notes (Signed)
Metaclopramide Rx for bloating Side effects discussed

## 2017-06-25 ENCOUNTER — Ambulatory Visit (INDEPENDENT_AMBULATORY_CARE_PROVIDER_SITE_OTHER): Payer: 59 | Admitting: Internal Medicine

## 2017-06-25 VITALS — BP 136/65 | HR 79 | Temp 98.2°F | Wt 180.7 lb

## 2017-06-25 DIAGNOSIS — F329 Major depressive disorder, single episode, unspecified: Secondary | ICD-10-CM | POA: Diagnosis not present

## 2017-06-25 DIAGNOSIS — R1031 Right lower quadrant pain: Secondary | ICD-10-CM

## 2017-06-25 DIAGNOSIS — F32A Depression, unspecified: Secondary | ICD-10-CM

## 2017-06-25 DIAGNOSIS — Z8719 Personal history of other diseases of the digestive system: Secondary | ICD-10-CM

## 2017-06-25 DIAGNOSIS — R1011 Right upper quadrant pain: Secondary | ICD-10-CM

## 2017-06-25 DIAGNOSIS — I10 Essential (primary) hypertension: Secondary | ICD-10-CM

## 2017-06-25 DIAGNOSIS — Z79899 Other long term (current) drug therapy: Secondary | ICD-10-CM | POA: Diagnosis not present

## 2017-06-25 DIAGNOSIS — E785 Hyperlipidemia, unspecified: Secondary | ICD-10-CM | POA: Diagnosis not present

## 2017-06-25 DIAGNOSIS — R1084 Generalized abdominal pain: Secondary | ICD-10-CM

## 2017-06-25 NOTE — Assessment & Plan Note (Signed)
BP Readings from Last 3 Encounters:  06/25/17 136/65  04/20/17 (!) 162/101  03/24/17 (!) 154/108   Patient was normotensive today.  -Continue with current dose of metoprolol at 75 mg daily.

## 2017-06-25 NOTE — Patient Instructions (Addendum)
Thank you for visiting clinic today. We will check your liver function and cholesterol today, we will call you with any abnormal results. Please follow-up with your gastroenterologist as scheduled on 07/09/2017. Please continue to take your metoprolol as directed. Follow-up in 3 month for your blood pressure check.

## 2017-06-25 NOTE — Assessment & Plan Note (Signed)
He was asking for cholesterol check, as his cholesterol medications and was discontinued during his hospital stay with severe pancreatitis.  His hepatic function checked in December 2017 shows mildly elevated AST and bilirubin.  -Repeat CMP. -Lipid profile

## 2017-06-25 NOTE — Progress Notes (Signed)
   CC: For follow-up of his blood pressure.  HPI:  [name] is a 41 y.o. with past medical history as listed below came to the clinic for follow-up of his blood pressure.  Patient was feeling better down, denying any suicidal ideation, he was placed on Cymbalta by his gastroenterologist for his chronic abdominal pain, it was stopped gradually 2 weeks ago as it was not really helping with the pain, according to patient since he stopped that medicine he is feeling better down. His PTH 29 score today was 17, which makes him moderately severe depression. Patient is not interested in either restarting Cymbalta or any other antidepressant at this time.  Patient is still complaining of constant right upper and lower quadrant abdominal pain, 6/10 in intensity, he follow-up with GI and now they are recommending to [facility], as they were unable to find any cause.  He was also requesting to check his cholesterol. As his cholesterol medicine was stopped and he had severe pancreatitis last year.  Past Medical History:  Diagnosis Date  . Acute pancreatitis    1-8-17to 02-03-16 [facility].  . Bile leak   . Depression   . Diverticulosis   . GERD (gastroesophageal reflux disease)   . H/O seasonal allergies    OTC med used  . Hepatic steatosis   . Hypercholesteremia   . Hypertension    01-05-16 to 02-03-16 Hospitalized for pancreatitis, B/P med discontinued during that time.  . Pancreatic necrosis   . Pancreatic pseudocyst   . PONV (postoperative nausea and vomiting) 03-04-16   ponv after cholecystectomy   Review of Systems:  As per HPI.  Physical Exam:  Vitals:   06/25/17 1600  BP: 136/65  Pulse: 79  Temp: 98.2 F (36.8 C)  TempSrc: Oral  Weight: 180 lb 11.2 oz (82 kg)   General: Vital signs reviewed.  Patient is well-developed and well-nourished, in no acute distress and cooperative with exam.  Cardiovascular: RRR, S1 normal, S2 normal, no murmurs, gallops, or  rubs. Pulmonary/Chest: Clear to auscultation bilaterally, no wheezes, rales, or rhonchi. Abdominal: Soft, non-tender, non-distended, BS +, no masses, organomegaly, or guarding present.  Extremities: No lower extremity edema bilaterally,  pulses symmetric and intact bilaterally. No cyanosis or clubbing. Skin: Warm, dry and intact. No rashes or erythema. Psychiatric: Patient appears anxious and worry. speech and behavior is normal. Cognition and memory are normal.  Assessment & Plan:   See Encounters Tab for problem based charting.  Patient discussed with Dr. Criselda PeachesMullen.

## 2017-06-25 NOTE — Assessment & Plan Note (Signed)
Follow-up with GI, recently recommended to [facility] as they were unable to find any cause for his chronic abdominal pain.  -He was advised to continue follow-up with GI. -His next appointment is on 07/09/2017.

## 2017-06-25 NOTE — Assessment & Plan Note (Signed)
This patient was complaining of being little down since he stopped Cymbalta 2 weeks ago, Cymbalta was stopped gradually. He took Cymbalta for 1 month for his chronic abdominal pain, it was given to him by his gastroenterologist and when it seems not working they advised to discontinue it.  His PHQ 9 score today was 17. Patient do not want to restart Cymbalta or any other anti-depressant. He denies any suicidal ideations, he appears little anxious and worried. He wants to discuss it with his gastroenterologist.

## 2017-06-26 LAB — CMP14 + ANION GAP
A/G RATIO: 1.7 (ref 1.2–2.2)
ALK PHOS: 76 IU/L (ref 39–117)
ALT: 69 IU/L — ABNORMAL HIGH (ref 0–44)
AST: 42 IU/L — ABNORMAL HIGH (ref 0–40)
Albumin: 4.5 g/dL (ref 3.5–5.5)
Anion Gap: 18 mmol/L (ref 10.0–18.0)
BILIRUBIN TOTAL: 1.1 mg/dL (ref 0.0–1.2)
BUN/Creatinine Ratio: 12 (ref 9–20)
BUN: 12 mg/dL (ref 6–24)
CHLORIDE: 100 mmol/L (ref 96–106)
CO2: 25 mmol/L (ref 20–29)
Calcium: 9.8 mg/dL (ref 8.7–10.2)
Creatinine, Ser: 0.98 mg/dL (ref 0.76–1.27)
GFR calc Af Amer: 109 mL/min/{1.73_m2} (ref >59)
GFR calc non Af Amer: 95 mL/min/{1.73_m2} (ref >59)
GLOBULIN, TOTAL: 2.6 g/dL (ref 1.5–4.5)
GLUCOSE: 101 mg/dL — AB (ref 65–99)
POTASSIUM: 4.4 mmol/L (ref 3.5–5.2)
SODIUM: 143 mmol/L (ref 134–144)
Total Protein: 7.1 g/dL (ref 6.0–8.5)

## 2017-06-26 LAB — LIPID PANEL
CHOL/HDL RATIO: 4.9 ratio (ref 0.0–5.0)
Cholesterol, Total: 156 mg/dL (ref 100–199)
HDL: 32 mg/dL — AB (ref >39)
LDL CALC: 50 mg/dL (ref 0–99)
Triglycerides: 368 mg/dL — ABNORMAL HIGH (ref 0–149)
VLDL Cholesterol Cal: 74 mg/dL — ABNORMAL HIGH (ref 5–40)

## 2017-06-28 ENCOUNTER — Telehealth: Payer: Self-pay

## 2017-06-28 NOTE — Progress Notes (Signed)
Internal Medicine Clinic Attending  Case discussed with Dr. Amin at the time of the visit.  We reviewed the resident's history and exam and pertinent patient test results.  I agree with the assessment, diagnosis, and plan of care documented in the resident's note.    

## 2017-06-28 NOTE — Telephone Encounter (Addendum)
Referral initiated via faxed referral form and records  ----- Message from Iva Booparl E Gessner, MD sent at 06/24/2017  4:39 PM EDT ----- Regarding: [facility] referral [facility]  Re: bloating and abdominal pain - chronic after gallstone pancreatitis, cholecystectomy and cystgastrostomy  Thanks

## 2017-07-09 ENCOUNTER — Ambulatory Visit (INDEPENDENT_AMBULATORY_CARE_PROVIDER_SITE_OTHER): Payer: 59 | Admitting: Internal Medicine

## 2017-07-09 ENCOUNTER — Encounter: Payer: Self-pay | Admitting: Internal Medicine

## 2017-07-09 VITALS — BP 122/80 | HR 74 | Ht 63.5 in | Wt 181.6 lb

## 2017-07-09 DIAGNOSIS — R1012 Left upper quadrant pain: Secondary | ICD-10-CM | POA: Diagnosis not present

## 2017-07-09 DIAGNOSIS — G8929 Other chronic pain: Secondary | ICD-10-CM

## 2017-07-09 DIAGNOSIS — R1011 Right upper quadrant pain: Secondary | ICD-10-CM

## 2017-07-09 MED ORDER — TRAMADOL HCL 50 MG PO TABS: 50.0000 mg | ORAL_TABLET | Freq: Four times a day (QID) | ORAL | 1 refills | Status: DC | PRN

## 2017-07-09 NOTE — Progress Notes (Signed)
Drew Prince 41 y.o. 1976/11/09 [id number]  Assessment & Plan:   Encounter Diagnosis  Name Primary?  . Chronic bilateral upper abdominal pain Yes    Continued abdominal pain after severe pancreatitis laparoscopic cholecystectomy and bile leak. I have tried , duloxetine, metoclopramide, dicyclomine, PPI, ketorolac, though I haven't tried Neurontin or Lyrica those are possibilities. At this point he is referred to [facility]. I'm going to prescribe tramadol for severe pain. One refill. Probably not a good long-term solution but try to help him with his symptoms. He'll return to me as needed. Awaiting the referral to [facility] to go through.  I did explain that this may be as good as it gets. I know his bothered by these chronic symptoms. However it is also possible that further healing after the severe inflammation he had and the surgeries can still take place.  I appreciate the opportunity to care for this patient. CC: Darreld Mclean, MD Dr. Jed Limerick Ramirez's  Subjective:   Chief Complaint:Abdominal pain  HPI Norah here is he still has his bilateral upper abdominal pressure whenever he sits. Sometimes it flares and it's worse. He's had side effects and lack of success with treatments that I described as reflected in the assessment and plan. He had sexual dysfunction and sweats with duloxetine so stopped that. I had his CT scans reviewed by surgery as he has a minimally dilated but uninflamed appendix on the last 1 or 2 CTs. Dr. Derrell Lolling did not think that that was worth chasing and I agreed. Allergies  Allergen Reactions  . Penicillins Rash and Other (See Comments)    Has patient had a PCN reaction causing immediate rash, facial/tongue/throat swelling, SOB or lightheadedness with hypotension: No Has patient had a PCN reaction causing severe rash involving mucus membranes or skin necrosis: No Has patient had a PCN reaction that required hospitalization No Has patient had a PCN reaction  occurring within the last 10 years: No If all of the above answers are "NO", then may proceed with Cephalosporin use.    Current Meds  Medication Sig  . cetirizine (ZYRTEC) 10 MG tablet Take 10 mg by mouth every evening.   . diphenhydrAMINE (BENADRYL) 25 MG tablet Take 25 mg by mouth every evening.   . metoCLOPramide (REGLAN) 5 MG tablet Take 1 tablet (5 mg total) by mouth 3 (three) times daily before meals.  . metoprolol succinate (TOPROL-XL) 25 MG 24 hr tablet TAKE 3 TABLETS BY MOUTH DAILY ALONG WITH OR IMMEDIATELY FOLLOWING A MEAL  . pantoprazole (PROTONIX) 40 MG tablet TAKE 1 TABLET BY MOUTH EVERY EVENING   Past Medical History:  Diagnosis Date  . Acute pancreatitis    1-8-17to 02-03-16 Hospital stay at [facility].  . Bile leak   . Depression   . Diverticulosis   . GERD (gastroesophageal reflux disease)   . H/O seasonal allergies    OTC med used  . Hepatic steatosis   . Hypercholesteremia   . Hypertension    01-05-16 to 02-03-16 Hospitalized for pancreatitis, B/P med discontinued during that time.  . Pancreatic necrosis   . Pancreatic pseudocyst   . PONV (postoperative nausea and vomiting) 03-04-16   ponv after cholecystectomy   Past Surgical History:  Procedure Laterality Date  . CHOLECYSTECTOMY N/A 03/04/2016   Procedure: LAPAROSCOPIC CHOLECYSTECTOMY ;  Surgeon: Axel Filler, MD;  Location: [facility] ORS;  Service: General;  Laterality: N/A;  . ERCP N/A 03/16/2016   Procedure: ENDOSCOPIC RETROGRADE CHOLANGIOPANCREATOGRAPHY (ERCP);  Surgeon: Meryl Dare, MD;  Location: [facility] ENDOSCOPY;  Service: Endoscopy;  Laterality: N/A;  . ERCP N/A 06/05/2016   Procedure: ENDOSCOPIC RETROGRADE CHOLANGIOPANCREATOGRAPHY (ERCP);  Surgeon: Iva Booparl E Gessner, MD;  [facility];  Service: Endoscopy;  Laterality: N/A;  . GASTROINTESTINAL STENT REMOVAL N/A 06/05/2016   Procedure: GASTROINTESTINAL STENT REMOVAL;  Surgeon: Iva Booparl E Gessner, MD;  [facility];  Service: Endoscopy;  Laterality: N/A;  .  HEMATOMA EVACUATION  03/09/2016   Procedure: LAPAROSCOPIC EVACUATION OF HEMATOMA;  Surgeon: Axel FillerArmando Ramirez, MD;  Location: [facility] OR;  Service: General;;  . LAPAROSCOPIC GASTROSTOMY N/A 06/10/2016   Procedure: LAPAROSCOPIC CYST GASTROSTOMY, INTRAOPERATIVE ULTRASOUND,  UPPER ENDOSCOPY ;  Surgeon: Axel FillerArmando Ramirez, MD;  Location: [facility] ORS;  Service: General;  Laterality: N/A;  . LAPAROSCOPY N/A 03/09/2016   Procedure: LAPAROSCOPY DIAGNOSTIC;  Surgeon: Axel FillerArmando Ramirez, MD;  Location: [facility] OR;  Service: General;  Laterality: N/A;  . WISDOM TOOTH EXTRACTION     "local only"     Review of Systems As above  Objective:   Physical Exam BP 122/80 (BP Location: Left Arm, Patient Position: Sitting)   Pulse 74   Ht 5' 3.5" (1.613 m)   Wt 181 lb 9.6 oz (82.4 kg)   SpO2 98%   BMI 31.66 kg/m  No acute distress

## 2017-07-09 NOTE — Patient Instructions (Signed)
Today we are giving you a printed rx for tramadol to take to the pharmacy.  This is to use while waiting on your [facility].   Stop your reglan per Dr Leone PayorGessner.    I appreciate the opportunity to care for you. Stan Headarl Gessner, MD, [facility]

## 2017-07-20 ENCOUNTER — Encounter: Payer: Self-pay | Admitting: Internal Medicine

## 2017-08-10 ENCOUNTER — Other Ambulatory Visit: Payer: Self-pay | Admitting: *Deleted

## 2017-08-11 MED ORDER — PANTOPRAZOLE SODIUM 40 MG PO TBEC: 40.0000 mg | DELAYED_RELEASE_TABLET | Freq: Every evening | ORAL | 0 refills | Status: DC

## 2017-08-23 NOTE — Telephone Encounter (Signed)
Patient is scheduled to see 10/25/17 8:30 with Dr. Shawnie Pons

## 2017-10-17 ENCOUNTER — Other Ambulatory Visit: Payer: Self-pay | Admitting: Internal Medicine

## 2017-10-17 DIAGNOSIS — I10 Essential (primary) hypertension: Secondary | ICD-10-CM

## 2017-11-08 ENCOUNTER — Other Ambulatory Visit: Payer: Self-pay | Admitting: Internal Medicine

## 2018-01-29 ENCOUNTER — Other Ambulatory Visit: Payer: Self-pay | Admitting: Internal Medicine

## 2018-01-29 DIAGNOSIS — I10 Essential (primary) hypertension: Secondary | ICD-10-CM

## 2018-01-31 ENCOUNTER — Other Ambulatory Visit: Payer: Self-pay | Admitting: Internal Medicine

## 2018-03-07 ENCOUNTER — Other Ambulatory Visit: Payer: Self-pay | Admitting: Internal Medicine

## 2018-03-07 DIAGNOSIS — I10 Essential (primary) hypertension: Secondary | ICD-10-CM

## 2018-03-09 ENCOUNTER — Ambulatory Visit (INDEPENDENT_AMBULATORY_CARE_PROVIDER_SITE_OTHER): Payer: 59 | Admitting: Internal Medicine

## 2018-03-09 VITALS — BP 142/94 | HR 99 | Temp 98.0°F | Ht 63.5 in | Wt 175.4 lb

## 2018-03-09 DIAGNOSIS — Z9049 Acquired absence of other specified parts of digestive tract: Secondary | ICD-10-CM | POA: Diagnosis not present

## 2018-03-09 DIAGNOSIS — Z934 Other artificial openings of gastrointestinal tract status: Secondary | ICD-10-CM

## 2018-03-09 DIAGNOSIS — I10 Essential (primary) hypertension: Secondary | ICD-10-CM

## 2018-03-09 DIAGNOSIS — L814 Other melanin hyperpigmentation: Secondary | ICD-10-CM | POA: Diagnosis not present

## 2018-03-09 DIAGNOSIS — K5900 Constipation, unspecified: Secondary | ICD-10-CM | POA: Diagnosis not present

## 2018-03-09 DIAGNOSIS — R1011 Right upper quadrant pain: Secondary | ICD-10-CM | POA: Diagnosis not present

## 2018-03-09 DIAGNOSIS — G8929 Other chronic pain: Secondary | ICD-10-CM

## 2018-03-09 DIAGNOSIS — L819 Disorder of pigmentation, unspecified: Secondary | ICD-10-CM | POA: Insufficient documentation

## 2018-03-09 DIAGNOSIS — Z79899 Other long term (current) drug therapy: Secondary | ICD-10-CM | POA: Diagnosis not present

## 2018-03-09 DIAGNOSIS — F339 Major depressive disorder, recurrent, unspecified: Secondary | ICD-10-CM

## 2018-03-09 DIAGNOSIS — R1012 Left upper quadrant pain: Secondary | ICD-10-CM | POA: Diagnosis not present

## 2018-03-09 DIAGNOSIS — R197 Diarrhea, unspecified: Secondary | ICD-10-CM | POA: Diagnosis not present

## 2018-03-09 NOTE — Progress Notes (Signed)
CC: Chronic bilateral upper abdominal pain  HPI:  [name] is a 42 y.o. with past medical history as listed below including severe biliary pancreatitis with necrosis status post lap cholecystectomy on 03/04/2016 complicated by postop bile leak status post cystgastrostomy on 06/10/2016 and hypertension who presents for follow-up management of his hypertension and chronic upper abdominal pain.  Please see problem based charting for status of patient's chronic medical issues.  Chronic bilateral upper abdominal pain [name] has a history of severe biliary pancreatitis complicated by pseudocyst and necrosis status post lap cholecystectomy in March 2017 complicated by postop bile leak status post cystgastrostomy in June 2017.  He has since developed chronic bilateral upper abdominal pain without definite reversible cause.  He has since had follow-up imaging including a CT abdomen pelvis in December 2017 which was largely unrevealing and most recently an MRCP with secretin stimulation in November 2018 which was normal.  [place] well as at [facility] for further evaluation and management.  Prior therapy for his pain and mood have included duloxetine (develop suicidal thoughts), amitriptyline, ketorolac, PPI, dicyclomine, and metoclopramide without success.  He is currently taking desipramine for pain and depression as well as colestipol for loose stools.  He says his pain is largely unchanged from prior and still occurs at the upper abdomen on both sides beneath his rib cages.  He denies any change in location or character of his pain.  He reports some slight constipation but maintains at least one bowel movement per day.  He notices having loose stools after eating fatty foods but does not see any steatorrhea.  He denies any fevers, chills, nausea, vomiting, or radiation of his pain to his back. A/P: He has continued chronic bilateral upper  abdominal pain which is likely sequela from his previous pancreatic disease (adhesions?).  Testing has shown that his pancreatic function is intact.  We will likely have chronic pain at baseline which hopefully will be able to be managed without significant impairment in quality of life.  [facility] which I think is reasonable. -Follow-up with gastroenterology as scheduled -Continue desipramine and colestipol -Continue pantoprazole  Hyperpigmentation of skin He has noticed a bruise-like spot on his right anterior shoulder which he first noticed about 2 or 3 months ago.  He denies any injury to the area or any injections in his shoulder.  He denies any similar spots on any other parts of his body.  He has not really kept track of the appearance of the spot but thinks that it might be slightly larger in size compared to prior. A/P: Hyperpigmented area on his anterior right shoulder without any features of melanoma, cellulitis, vasculitis, or injury.  At this time I recommend that he continue to observe the area and we can consider a skin biopsy in the future should be any changes.  Essential hypertension He is currently taking metoprolol 75 mg daily for blood pressure.  He was previously on lisinopril which was discontinued as a possible cause of his pancreatitis. BP Readings from Last 3 Encounters:  03/10/18 (!) 142/94  07/09/17 122/80  06/25/17 136/65  A/P: His BP is above goal this visit, previously has been well-controlled. We will reassess on follow up and can increase his Metoprolol to 100 mg daily if needed.   Past Medical History:  Diagnosis Date  . Acute pancreatitis    1-8-17to 02-03-16  Hospital stay at [facility].  . Bile leak   . Depression   . Diverticulosis   . GERD (gastroesophageal reflux disease)   . H/O seasonal allergies    OTC med used  . Hepatic steatosis   . Hypercholesteremia   . Hypertension      01-05-16 to 02-03-16 Hospitalized for pancreatitis, B/P med discontinued during that time.  . Pancreatic necrosis   . Pancreatic pseudocyst   . PONV (postoperative nausea and vomiting) 03-04-16   ponv after cholecystectomy   Review of Systems:   Review of Systems  Respiratory: Negative for shortness of breath.   Cardiovascular: Negative for chest pain.  Gastrointestinal: Positive for abdominal pain. Negative for constipation and diarrhea.       Occasional loose stools and constipation  Genitourinary: Negative for dysuria.  Musculoskeletal: Negative for falls.  Skin:       Bruise right shoulder     Physical Exam:  Vitals:   03/10/18 0833  BP: (!) 142/94  Pulse: 99  Temp: 98 F (36.7 C)  TempSrc: Oral  SpO2: 100%  Weight: 175 lb 6.4 oz (79.6 kg)  Height: 5' 3.5" (1.613 m)   Physical Exam  Constitutional: He is oriented to person, place, and time. He appears well-developed and well-nourished. No distress.  HENT:  Head: Normocephalic and atraumatic.  Cardiovascular: Normal rate and regular rhythm.  No murmur heard. Pulmonary/Chest: Effort normal. No respiratory distress. He has no wheezes. He has no rales.  Abdominal: Soft. Bowel sounds are normal. He exhibits no distension and no mass. There is no guarding.  Mild tenderness to palpation bilateral upper abdomen beneath the rib cages  Neurological: He is alert and oriented to person, place, and time.  Skin: Skin is warm. He is not diaphoretic.  Mildly hyperpigmented patch of the right anterior shoulder without discrete borders, ulceration, palpable nodules, ulceration, or variation in color.      Assessment & Plan:   See Encounters Tab for problem based charting.  Patient discussed with Dr. Oswaldo Done

## 2018-03-09 NOTE — Patient Instructions (Addendum)
It was a pleasure to see you again [name].  Please keep an eye on the bruise-like spot on your right shoulder. We can consider a skin biopsy if there are changes in the future.  Please follow up with Dr. Shawnie Ponsorn as scheduled and see us again in 6 months or sooner if needed.

## 2018-03-10 ENCOUNTER — Encounter: Payer: Self-pay | Admitting: Internal Medicine

## 2018-03-10 ENCOUNTER — Other Ambulatory Visit: Payer: Self-pay

## 2018-03-10 NOTE — Assessment & Plan Note (Signed)
He has noticed a bruise-like spot on his right anterior shoulder which he first noticed about 2 or 3 months ago.  He denies any injury to the area or any injections in his shoulder.  He denies any similar spots on any other parts of his body.  He has not really kept track of the appearance of the spot but thinks that it might be slightly larger in size compared to prior. A/P: Hyperpigmented area on his anterior right shoulder without any features of melanoma, cellulitis, vasculitis, or injury.  At this time I recommend that he continue to observe the area and we can consider a skin biopsy in the future should be any changes.

## 2018-03-10 NOTE — Assessment & Plan Note (Addendum)
Drew Prince has a history of severe biliary pancreatitis complicated by pseudocyst and necrosis status post lap cholecystectomy in March 2017 complicated by postop bile leak status post cystgastrostomy in June 2017.  He has since developed chronic bilateral upper abdominal pain without definite reversible cause.  He has since had follow-up imaging including a CT abdomen pelvis in December 2017 which was largely unrevealing and most recently an MRCP with secretin stimulation in November 2018 which was normal.  He has been seen by gastroenterology [place] in [place] as well as at [facility] for further [facility] and management.  Prior therapy for his pain and mood have included duloxetine (develop suicidal thoughts), amitriptyline, ketorolac, PPI, dicyclomine, and metoclopramide without success.  He is currently taking desipramine for pain and depression as well as colestipol for loose stools.  He says his pain is largely unchanged from prior and still occurs at the upper abdomen on both sides beneath his rib cages.  He denies any change in location or character of his pain.  He reports some slight constipation but maintains at least one bowel movement per day.  He notices having loose stools after eating fatty foods but does not see any steatorrhea.  He denies any fevers, chills, nausea, vomiting, or radiation of his pain to his back. A/P: He has continued chronic bilateral upper abdominal pain which is likely sequela from his previous pancreatic disease (adhesions?).  Testing has shown that his pancreatic function is intact.  We will likely have chronic pain at baseline which hopefully will be able to be managed without significant impairment in quality of life.  [facility] which I think is reasonable. -Follow-up with gastroenterology as scheduled -Continue desipramine and colestipol -Continue pantoprazole

## 2018-03-10 NOTE — Assessment & Plan Note (Addendum)
He is currently taking metoprolol 75 mg daily for blood pressure.  He was previously on lisinopril which was discontinued as a possible cause of his pancreatitis. BP Readings from Last 3 Encounters:  03/10/18 (!) 142/94  07/09/17 122/80  06/25/17 136/65  A/P: His BP is above goal this visit, previously has been well-controlled. We will reassess on follow up and can increase his Metoprolol to 100 mg daily if needed.

## 2018-03-11 NOTE — Progress Notes (Signed)
Internal Medicine Clinic Attending  Case discussed with Dr. Patel at the time of the visit.  We reviewed the resident's history and exam and pertinent patient test results.  I agree with the assessment, diagnosis, and plan of care documented in the resident's note.  

## 2018-05-13 ENCOUNTER — Other Ambulatory Visit: Payer: Self-pay | Admitting: Internal Medicine

## 2018-06-05 ENCOUNTER — Other Ambulatory Visit: Payer: Self-pay | Admitting: Internal Medicine

## 2018-06-05 DIAGNOSIS — I10 Essential (primary) hypertension: Secondary | ICD-10-CM

## 2018-06-08 ENCOUNTER — Encounter: Payer: Self-pay | Admitting: Internal Medicine

## 2018-06-13 ENCOUNTER — Ambulatory Visit: Payer: 59 | Admitting: Internal Medicine

## 2018-06-13 ENCOUNTER — Ambulatory Visit (HOSPITAL_COMMUNITY)
Admission: RE | Admit: 2018-06-13 | Discharge: 2018-06-13 | Disposition: A | Discharge status: Home / Self Care | Payer: 59 | Source: Ambulatory Visit | Attending: Internal Medicine | Admitting: Internal Medicine

## 2018-06-13 ENCOUNTER — Other Ambulatory Visit: Payer: Self-pay

## 2018-06-13 VITALS — BP 150/97 | HR 116 | Temp 98.4°F | Wt 182.6 lb

## 2018-06-13 DIAGNOSIS — L819 Disorder of pigmentation, unspecified: Secondary | ICD-10-CM

## 2018-06-13 DIAGNOSIS — R1011 Right upper quadrant pain: Secondary | ICD-10-CM | POA: Insufficient documentation

## 2018-06-13 DIAGNOSIS — Z934 Other artificial openings of gastrointestinal tract status: Secondary | ICD-10-CM

## 2018-06-13 DIAGNOSIS — Z9049 Acquired absence of other specified parts of digestive tract: Secondary | ICD-10-CM | POA: Diagnosis not present

## 2018-06-13 DIAGNOSIS — L814 Other melanin hyperpigmentation: Secondary | ICD-10-CM | POA: Diagnosis not present

## 2018-06-13 DIAGNOSIS — R1012 Left upper quadrant pain: Secondary | ICD-10-CM

## 2018-06-13 DIAGNOSIS — Z8719 Personal history of other diseases of the digestive system: Secondary | ICD-10-CM

## 2018-06-13 DIAGNOSIS — G8929 Other chronic pain: Secondary | ICD-10-CM

## 2018-06-13 DIAGNOSIS — I10 Essential (primary) hypertension: Secondary | ICD-10-CM | POA: Diagnosis not present

## 2018-06-13 DIAGNOSIS — Z79899 Other long term (current) drug therapy: Secondary | ICD-10-CM | POA: Diagnosis not present

## 2018-06-13 DIAGNOSIS — R9431 Abnormal electrocardiogram [ECG] [EKG]: Secondary | ICD-10-CM | POA: Insufficient documentation

## 2018-06-13 MED ORDER — METOPROLOL SUCCINATE ER 100 MG PO TB24: 100.0000 mg | ORAL_TABLET | Freq: Every day | ORAL | 3 refills | Status: DC

## 2018-06-13 NOTE — Patient Instructions (Addendum)
It was a pleasure to see you again [name].  I have increased your Metoprolol for blood pressure and sent a new prescription.  I do not see any alarming features regarding the spot on your foot. Please continue to monitor for any changes.  We have obtained an EKG to monitor for any side effect from the Desipramine.   Please follow up with us again in 4 weeks for a blood pressure recheck or sooner if needed.

## 2018-06-13 NOTE — Progress Notes (Signed)
CC: spot on foot  HPI:  [name] is a 42 y.o. male with past medical history as listed below including severe biliary pancreatitis with necrosis status post lap cholecystectomy on 03/04/2016 located by postop bile leak status post cystogastrostomy on 06/10/2016 now with chronic bilateral upper abdominal pain who presents for evaluation of a spot on his foot.  Please see problem based charting for status of patient's chronic medical issues.  Chronic bilateral upper abdominal pain He has a history of severe biliary pancreatitis complicated by pseudocyst and necrosis status post lap cholecystectomy in March 2017 which was complicated by postop bile leak status post cyst gastrostomy June 2017.  He has since developed chronic bilateral upper abdominal pain which has been unrelieved with multiple medical therapies.  He has had follow-up work-up imaging including a CT abdomen pelvis in December 2017 which was unrevealing and an MRCP with secretin stimulation in November 2018 which was normal.  He has been seen in consultation with gastroenterology in [place] as well as at [facility].  He is not also seeing pain management at [facility] and has been referred to pain psychiatry as well.  He is currently taking desipramine 150 mg daily and Protonix 40 mg daily.  He has been prescribed topical lidocaine and is planning to start Lyrica pending insurance approval.  Previous therapies included duloxetine, amitriptyline, metoclopramide, dicyclomine, ketorolac, tramadol, Wellbutrin, and gabapentin without significant relief.  He says his pain is largely unchanged and remains at his upper abdomen.  He is not having significant nausea, vomiting, change in bowel habits, fevers, chills, or diaphoresis. A/P: He has unchanged chronic pain likely secondary to his prior abdominal illness and surgeries, potentially from scar tissue and/or adhesions.  It was requested that he have an EKG completed to monitor for any  potential side effects from his desipramine.  This was obtained and showed a normal sinus rhythm, normal heart rate improved from prior, PR interval less than 200 ms, QRS less than 120 ms, and QTC less than 460 ms.  I have provided the patient with a copy of his EKG.  Recommend that he continue his current medications as prescribed and follow-up with the gastroenterologist, pain specialist, and pain psychiatrist as scheduled.    Essential hypertension He is currently taking metoprolol 75 mg daily for blood pressure.  He was previously on lisinopril which was discontinued due to potential cause of his pancreatic issues.  He has not been on thiazide diuretic for the same reasons.  He was previously on amlodipine have reported a side effect of cramping in his legs.  His BP on arrival was 150/97. BP Readings from Last 3 Encounters:  06/13/18 (!) 150/97  03/10/18 (!) 142/94  07/09/17 122/80   A/P: His blood pressure is elevated today.  I have increase his metoprolol to 100 mg daily.  This can be further increased if needed and if he is tolerable.  Follow-up in 4 weeks for reassessment.  Hyperpigmentation of skin He presents for evaluation of a spot he has noticed on the plantar surface of his left foot within the last couple of weeks.  He is really not sure how long he has been there but noticed it recently when he was bathing himself.  He has not noticed any changes since he first noticed it.  He denies any injury to his foot.  He denies any change in the texture of the skin or any palpable lumps, bumps, irritation, or pain at the site. A/P: Patient with hyperpigmented  brown spot on the plantar surface of his left foot at the base of the toes approximately half a centimeter in size with discrete borders.  There is no palpable change in the skin or nodularity.  No scaling, irregular borders, variation in color, or open skin.  There are no features of melanoma, injury, infection, inflammation.  I  recommended that he continue to observe the area for any changes.    Past Medical History:  Diagnosis Date  . Acute pancreatitis    1-8-17to 02-03-16 Hospital stay at [facility].  . Bile leak   . Depression   . Diverticulosis   . GERD (gastroesophageal reflux disease)   . H/O seasonal allergies    OTC med used  . Hepatic steatosis   . Hypercholesteremia   . Hypertension    01-05-16 to 02-03-16 Hospitalized for pancreatitis, B/P med discontinued during that time.  . Pancreatic necrosis   . Pancreatic pseudocyst   . PONV (postoperative nausea and vomiting) 03-04-16   ponv after cholecystectomy   Review of Systems:   Review of Systems  Constitutional: Negative for chills, diaphoresis and fever.  Respiratory: Negative for shortness of breath.   Cardiovascular: Negative for chest pain and palpitations.  Gastrointestinal: Negative for diarrhea, nausea and vomiting.       Upper abdominal pain unchanged from prior  Skin:       Dark spot on bottom of foot     Physical Exam:  Vitals:   06/13/18 1602  BP: (!) 150/97  Pulse: (!) 116  Temp: 98.4 F (36.9 C)  TempSrc: Oral  SpO2: 99%  Weight: 182 lb 9.6 oz (82.8 kg)   Physical Exam  Constitutional: He is oriented to person, place, and time.  HENT:  Head: Normocephalic and atraumatic.  Cardiovascular: Normal rate and regular rhythm.  No murmur heard. Pulmonary/Chest: Effort normal. No respiratory distress. He has no wheezes. He has no rales.  Abdominal:  Slight tenderness to palpation of the upper abdomen beneath the rib cages on both sides and the epigastric region.  No tenderness to lower abdomen.  Musculoskeletal: He exhibits no edema.  Neurological: He is alert and oriented to person, place, and time.  Skin: Skin is warm.  Approximately half centimeter brown hyperpigmented spot at the distal plantar surface of his left foot with discrete borders.  There is no palpable change in the skin or nodularity.  No scaling, irregular  borders, variation in color, or open skin wound.     Assessment & Plan:   See Encounters Tab for problem based charting.  Patient discussed with Dr. Rogelia BogaButcher

## 2018-06-14 NOTE — Progress Notes (Signed)
Internal Medicine Clinic Attending  Case discussed with Dr. Patel at the time of the visit.  We reviewed the resident's history and exam and pertinent patient test results.  I agree with the assessment, diagnosis, and plan of care documented in the resident's note.  

## 2018-06-14 NOTE — Assessment & Plan Note (Signed)
He has a history of severe biliary pancreatitis complicated by pseudocyst and necrosis status post lap cholecystectomy in March 2017 which was complicated by postop bile leak status post cyst gastrostomy June 2017.  He has since developed chronic bilateral upper abdominal pain which has been unrelieved with multiple medical therapies.  He has had follow-up work-up imaging including a CT abdomen pelvis in December 2017 which was unrevealing and an MRCP with secretin stimulation in November 2018 which was normal.  He has been seen in consultation with gastroenterology in [place] as well as at [facility].  He is not also seeing pain management at [facility] and has been referred to pain psychiatry as well.  He is currently taking desipramine 150 mg daily and Protonix 40 mg daily.  He has been prescribed topical lidocaine and is planning to start Lyrica pending insurance approval.  Previous therapies included duloxetine, amitriptyline, metoclopramide, dicyclomine, ketorolac, tramadol, Wellbutrin, and gabapentin without significant relief.  He says his pain is largely unchanged and remains at his upper abdomen.  He is not having significant nausea, vomiting, change in bowel habits, fevers, chills, or diaphoresis. A/P: He has unchanged chronic pain likely secondary to his prior abdominal illness and surgeries, potentially from scar tissue and/or adhesions.  It was requested that he have an EKG completed to monitor for any potential side effects from his desipramine.  This was obtained and showed a normal sinus rhythm, normal heart rate improved from prior, PR interval less than 200 ms, QRS less than 120 ms, and QTC less than 460 ms.  I have provided the patient with a copy of his EKG.  Recommend that he continue his current medications as prescribed and follow-up with the gastroenterologist, pain specialist, and pain psychiatrist as scheduled.

## 2018-06-14 NOTE — Assessment & Plan Note (Signed)
He presents for evaluation of a spot he has noticed on the plantar surface of his left foot within the last couple of weeks.  He is really not sure how long he has been there but noticed it recently when he was bathing himself.  He has not noticed any changes since he first noticed it.  He denies any injury to his foot.  He denies any change in the texture of the skin or any palpable lumps, bumps, irritation, or pain at the site. A/P: Patient with hyperpigmented brown spot on the plantar surface of his left foot at the base of the toes approximately half a centimeter in size with discrete borders.  There is no palpable change in the skin or nodularity.  No scaling, irregular borders, variation in color, or open skin.  There are no features of melanoma, injury, infection, inflammation.  I recommended that he continue to observe the area for any changes.

## 2018-06-14 NOTE — Assessment & Plan Note (Signed)
He is currently taking metoprolol 75 mg daily for blood pressure.  He was previously on lisinopril which was discontinued due to potential cause of his pancreatic issues.  He has not been on thiazide diuretic for the same reasons.  He was previously on amlodipine have reported a side effect of cramping in his legs.  His BP on arrival was 150/97. BP Readings from Last 3 Encounters:  06/13/18 (!) 150/97  03/10/18 (!) 142/94  07/09/17 122/80   A/P: His blood pressure is elevated today.  I have increase his metoprolol to 100 mg daily.  This can be further increased if needed and if he is tolerable.  Follow-up in 4 weeks for reassessment.

## 2018-07-19 ENCOUNTER — Encounter: Payer: Self-pay | Admitting: *Deleted

## 2018-07-24 ENCOUNTER — Encounter (HOSPITAL_COMMUNITY): Payer: Self-pay

## 2018-07-24 ENCOUNTER — Emergency Department (HOSPITAL_COMMUNITY)
Admission: EM | Admit: 2018-07-24 | Discharge: 2018-07-25 | Disposition: A | Discharge status: Home / Self Care | Payer: 59 | Attending: Emergency Medicine | Admitting: Emergency Medicine

## 2018-07-24 DIAGNOSIS — I10 Essential (primary) hypertension: Secondary | ICD-10-CM | POA: Diagnosis not present

## 2018-07-24 DIAGNOSIS — R1012 Left upper quadrant pain: Secondary | ICD-10-CM | POA: Insufficient documentation

## 2018-07-24 DIAGNOSIS — Z79899 Other long term (current) drug therapy: Secondary | ICD-10-CM | POA: Insufficient documentation

## 2018-07-24 LAB — COMPREHENSIVE METABOLIC PANEL
ALK PHOS: 93 U/L (ref 38–126)
ALT: 71 U/L — ABNORMAL HIGH (ref 0–44)
ANION GAP: 11 (ref 5–15)
AST: 40 U/L (ref 15–41)
Albumin: 4.2 g/dL (ref 3.5–5.0)
BILIRUBIN TOTAL: 1.4 mg/dL — AB (ref 0.3–1.2)
BUN: 9 mg/dL (ref 6–20)
CALCIUM: 9.5 mg/dL (ref 8.9–10.3)
CO2: 26 mmol/L (ref 22–32)
Chloride: 104 mmol/L (ref 98–111)
Creatinine, Ser: 1.12 mg/dL (ref 0.61–1.24)
GFR calc Af Amer: >60 mL/min (ref >60)
Glucose, Bld: 117 mg/dL — ABNORMAL HIGH (ref 70–99)
POTASSIUM: 3.7 mmol/L (ref 3.5–5.1)
Sodium: 141 mmol/L (ref 135–145)
TOTAL PROTEIN: 7.5 g/dL (ref 6.5–8.1)

## 2018-07-24 LAB — URINALYSIS, ROUTINE W REFLEX MICROSCOPIC
Bilirubin Urine: NEGATIVE
Glucose, UA: NEGATIVE mg/dL
Hgb urine dipstick: NEGATIVE
KETONES UR: NEGATIVE mg/dL
LEUKOCYTES UA: NEGATIVE
Nitrite: NEGATIVE
PH: 5 (ref 5.0–8.0)
PROTEIN: NEGATIVE mg/dL
Specific Gravity, Urine: 1.02 (ref 1.005–1.030)

## 2018-07-24 LAB — CBC
HEMATOCRIT: 54 % — AB (ref 39.0–52.0)
HEMOGLOBIN: 17.2 g/dL — AB (ref 13.0–17.0)
MCH: 26.5 pg (ref 26.0–34.0)
MCHC: 31.9 g/dL (ref 30.0–36.0)
MCV: 83.1 fL (ref 78.0–100.0)
Platelets: 296 10*3/uL (ref 150–400)
RBC: 6.5 MIL/uL — ABNORMAL HIGH (ref 4.22–5.81)
RDW: 13.1 % (ref 11.5–15.5)
WBC: 8.4 10*3/uL (ref 4.0–10.5)

## 2018-07-24 LAB — LIPASE, BLOOD: Lipase: 38 U/L (ref 11–51)

## 2018-07-24 LAB — I-STAT TROPONIN, ED: Troponin i, poc: 0 ng/mL (ref 0.00–0.08)

## 2018-07-24 LAB — I-STAT CG4 LACTIC ACID, ED: Lactic Acid, Venous: 1.73 mmol/L (ref 0.5–1.9)

## 2018-07-24 MED ORDER — MORPHINE SULFATE (PF) 4 MG/ML IV SOLN
4.0000 mg | Freq: Once | INTRAVENOUS | Status: AC
  Administered 2018-07-24: 4 mg via INTRAVENOUS
  Filled 2018-07-24: qty 1

## 2018-07-24 MED ORDER — SODIUM CHLORIDE 0.9 % IV BOLUS
1000.0000 mL | Freq: Once | INTRAVENOUS | Status: AC
  Administered 2018-07-24: 1000 mL via INTRAVENOUS

## 2018-07-24 MED ORDER — IOHEXOL 300 MG/ML  SOLN
100.0000 mL | Freq: Once | INTRAMUSCULAR | Status: AC | PRN
  Administered 2018-07-25: 100 mL via INTRAVENOUS

## 2018-07-24 NOTE — ED Provider Notes (Signed)
[facility] Provider Note   CSN: [id number] Arrival date & time: 07/24/18  2115     History   Chief Complaint Chief Complaint  Patient presents with  . Flank Pain  . Abdominal Pain    HPI Drew Prince is a 42 y.o. male with history of pancreatitis due to gallstones, status post cholecystectomy who presents with acute onset left upper quadrant pain after eating tonight.  He reports he was eating a small sandwich.  He reports feeling like his abdomen was going to explode.  He had associated nausea, but no vomiting.  He denies any fevers.  He reports he has worsening pain with breathing.  He denies any chest pain or shortness of breath.  He reports not having many issues other than some chronic abdominal pain related to scar tissue since his gallbladder was removed, however he did have postop complications including a bile leak.  He reports he had a normal bowel movement earlier this afternoon.  He denies any flatus since the onset of his pain.  He did not take any medication prior to arrival.  HPI   Past Medical History:  Diagnosis Date  . Acute pancreatitis    1-8-17to 02-03-16 Hospital stay at [facility].  . Bile leak   . Depression   . Diverticulosis   . GERD (gastroesophageal reflux disease)   . H/O seasonal allergies    OTC med used  . Hepatic steatosis   . Hypercholesteremia   . Hypertension    01-05-16 to 02-03-16 Hospitalized for pancreatitis, B/P med discontinued during that time.  . Pancreatic necrosis   . Pancreatic pseudocyst   . PONV (postoperative nausea and vomiting) 03-04-16   ponv after cholecystectomy    Patient Active Problem List   Diagnosis Date Noted  . Hyperpigmentation of skin 03/09/2018  . Depression 06/25/2017  . Cold hands 03/24/2017  . Leg cramp 03/24/2017  . Healthcare maintenance 02/01/2017  . GERD (gastroesophageal reflux disease) 07/01/2016  . Chronic bilateral upper abdominal pain   . Essential  hypertension 01/05/2016  . Hyperlipidemia 01/05/2016    Past Surgical History:  Procedure Laterality Date  . CHOLECYSTECTOMY N/A 03/04/2016   Procedure: LAPAROSCOPIC CHOLECYSTECTOMY ;  Surgeon: Axel FillerArmando Ramirez, MD;  Location: [facility] ORS;  Service: General;  Laterality: N/A;  . ERCP N/A 03/16/2016   Procedure: ENDOSCOPIC RETROGRADE CHOLANGIOPANCREATOGRAPHY (ERCP);  Surgeon: Meryl DareMalcolm T Stark, MD;  [facility];  Service: Endoscopy;  Laterality: N/A;  . ERCP N/A 06/05/2016   Procedure: ENDOSCOPIC RETROGRADE CHOLANGIOPANCREATOGRAPHY (ERCP);  Surgeon: Iva Booparl E Gessner, MD;  [facility];  Service: Endoscopy;  Laterality: N/A;  . GASTROINTESTINAL STENT REMOVAL N/A 06/05/2016   Procedure: GASTROINTESTINAL STENT REMOVAL;  Surgeon: Iva Booparl E Gessner, MD;  [facility];  Service: Endoscopy;  Laterality: N/A;  . HEMATOMA EVACUATION  03/09/2016   Procedure: LAPAROSCOPIC EVACUATION OF HEMATOMA;  Surgeon: Axel FillerArmando Ramirez, MD;  Location: [facility] OR;  Service: General;;  . LAPAROSCOPIC GASTROSTOMY N/A 06/10/2016   Procedure: LAPAROSCOPIC CYST GASTROSTOMY, INTRAOPERATIVE ULTRASOUND,  UPPER ENDOSCOPY ;  Surgeon: Axel FillerArmando Ramirez, MD;  Location: [facility] ORS;  Service: General;  Laterality: N/A;  . LAPAROSCOPY N/A 03/09/2016   Procedure: LAPAROSCOPY DIAGNOSTIC;  Surgeon: Axel FillerArmando Ramirez, MD;  Location: [facility] OR;  Service: General;  Laterality: N/A;  . WISDOM TOOTH EXTRACTION     "local only"        Home Medications    Prior to Admission medications   Medication Sig Start Date End Date Taking? Authorizing Provider  cetirizine (ZYRTEC) 10 MG tablet Take 10 mg by mouth every evening.    Yes [provider]  colestipol (COLESTID) 1 g tablet Take 1 g by mouth daily.  10/25/17 10/25/18 Yes [provider]  desipramine (NOPRAMIN) 100 MG tablet Take 150 mg by mouth at bedtime.  01/26/18 07/24/26 Yes [provider]  diphenhydrAMINE (BENADRYL) 25 MG tablet Take 25 mg by mouth every evening.     Yes [provider]  LYRICA 25 MG capsule Take 50 mg by mouth 2 (two) times daily. 07/21/18  Yes [provider]  metoprolol succinate (TOPROL-XL) 100 MG 24 hr tablet Take 1 tablet (100 mg total) by mouth daily. 06/13/18  Yes Darreld Mclean, MD  pantoprazole (PROTONIX) 40 MG tablet TAKE 1 TABLET(40 MG) BY MOUTH EVERY EVENING 05/13/18  Yes Darreld Mclean, MD  ondansetron (ZOFRAN) 4 MG tablet Take 1 tablet (4 mg total) by mouth every 6 (six) hours. 07/25/18   Emi Holes, PA-C  oxyCODONE-acetaminophen (PERCOCET/ROXICET) 5-325 MG tablet Take 1-2 tablets by mouth every 6 (six) hours as needed for severe pain. 07/25/18   Emi Holes, PA-C    Family History Family History  Problem Relation Age of Onset  . Hypertension Father     Social History Social History   Tobacco Use  . Smoking status: Never Smoker  . Smokeless tobacco: Never Used  Substance Use Topics  . Alcohol use: Yes    Alcohol/week: 0.0 oz    Comment: rarely  . Drug use: No     Allergies   Penicillins and Fexofenadine   Review of Systems Review of Systems  Constitutional: Negative for chills and fever.  HENT: Negative for facial swelling and sore throat.   Respiratory: Negative for shortness of breath.   Cardiovascular: Negative for chest pain.  Gastrointestinal: Positive for abdominal pain and nausea. Negative for blood in stool, constipation, diarrhea and vomiting.  Genitourinary: Negative for dysuria.  Musculoskeletal: Negative for back pain.  Skin: Negative for rash and wound.  Neurological: Negative for headaches.  Psychiatric/Behavioral: The patient is not nervous/anxious.      Physical Exam Updated Vital Signs BP (!) 146/109   Pulse (!) 117   Temp 97.7 F (36.5 C) (Oral)   Resp 18   SpO2 98%   Physical Exam  Constitutional: He appears well-developed and well-nourished. No distress.  HENT:  Head: Normocephalic and atraumatic.  Mouth/Throat: Oropharynx is clear and moist.  No oropharyngeal exudate.  Eyes: Pupils are equal, round, and reactive to light. Conjunctivae are normal. Right eye exhibits no discharge. Left eye exhibits no discharge. No scleral icterus.  Neck: Normal range of motion. Neck supple. No thyromegaly present.  Cardiovascular: Regular rhythm, normal heart sounds and intact distal pulses. Tachycardia present. Exam reveals no gallop and no friction rub.  No murmur heard. Pulmonary/Chest: Effort normal and breath sounds normal. No stridor. No respiratory distress. He has no wheezes. He has no rales.  Abdominal: Soft. Bowel sounds are normal. He exhibits no distension. There is tenderness in the right lower quadrant, left upper quadrant and left lower quadrant. There is tenderness at McBurney's point. There is no rebound and no guarding.    Musculoskeletal: He exhibits no edema.       Back:  Lymphadenopathy:    He has no cervical adenopathy.  Neurological: He is alert. Coordination normal.  Skin: Skin is warm and dry. No rash noted. He is not diaphoretic. No pallor.  Psychiatric: He has a normal mood and affect.  Nursing note and vitals reviewed.    ED Treatments / Results  Labs (all labs ordered are listed, but only abnormal results are displayed) Labs Reviewed  COMPREHENSIVE METABOLIC PANEL - Abnormal; Notable for the following components:      Result Value   Glucose, Bld 117 (*)    ALT 71 (*)    Total Bilirubin 1.4 (*)    All other components within normal limits  CBC - Abnormal; Notable for the following components:   RBC 6.50 (*)    Hemoglobin 17.2 (*)    HCT 54.0 (*)    All other components within normal limits  LIPASE, BLOOD  URINALYSIS, ROUTINE W REFLEX MICROSCOPIC  I-STAT CG4 LACTIC ACID, ED  I-STAT TROPONIN, ED    EKG EKG Interpretation  Date/Time:  Sunday July 24 2018 23:08:23 EDT Ventricular Rate:  112 PR Interval:    QRS Duration: 102 QT Interval:  313 QTC Calculation: 428 R Axis:   65 Text Interpretation:   Sinus tachycardia ST elev, probable normal early repol pattern Baseline wander in lead(s) V6 When compared with ECG of 06/13/2018, No significant change was found Confirmed by Dione Booze ([id number]) on 07/25/2018 1:25:08 AM Also confirmed by Dione Booze ([id number]), editor Elita Quick ([id number])  on 07/25/2018 6:57:46 AM   Radiology Ct Abdomen Pelvis W Contrast  Result Date: 07/25/2018 CLINICAL DATA:  Abdominal pain, history of pancreatitis EXAM: CT ABDOMEN AND PELVIS WITH CONTRAST TECHNIQUE: Multidetector CT imaging of the abdomen and pelvis was performed using the standard protocol following bolus administration of intravenous contrast. CONTRAST:  OMNIPAQUE IOHEXOL 300 MG/ML  SOLN COMPARISON:  12/23/2016 FINDINGS: Lower chest: Atelectasis at the bilateral lung bases. Hepatobiliary: Liver is within normal limits. Status post cholecystectomy. No intrahepatic or extrahepatic ductal dilatation. Pancreas: No peripancreatic fluid/inflammatory changes on CT. Spleen: Within normal limits. Adrenals/Urinary Tract: Adrenal glands are within normal limits. 5 mm cyst along the lateral interpolar right kidney (series 3/image 41). Left kidney is within normal limits. No hydronephrosis. Bladder is within normal limits. Stomach/Bowel: Distended stomach. No evidence of bowel obstruction. Normal appendix (series 3/image 68). Vascular/Lymphatic: No evidence of abdominal aortic aneurysm. No suspicious abdominopelvic lymphadenopathy. Reproductive: Prostate is unremarkable. Other: No abdominopelvic ascites. Tiny fat containing left inguinal hernia (series 3/image 87). Musculoskeletal: Mild degenerative changes of the visualized thoracolumbar spine, most prominent at L5-S1. IMPRESSION: No peripancreatic inflammatory changes on CT to suggest acute pancreatitis. No evidence of bowel obstruction.  Normal appendix. No CT findings to account for the patient's abdominal pain. Electronically Signed   By: Charline Bills M.D.   On:  07/25/2018 00:45    Procedures Procedures (including critical care time)  Medications Ordered in ED Medications  sodium chloride 0.9 % bolus 1,000 mL (0 mLs Intravenous Stopped 07/25/18 0051)  morphine 4 MG/ML injection 4 mg (4 mg Intravenous Given 07/24/18 2313)  iohexol (OMNIPAQUE) 300 MG/ML solution 100 mL (100 mLs Intravenous Contrast Given 07/25/18 0018)  sodium chloride 0.9 % bolus 1,000 mL (0 mLs Intravenous Stopped 07/25/18 0347)  HYDROmorphone (DILAUDID) injection 1 mg (1 mg Intravenous Given 07/25/18 0145)  ondansetron (ZOFRAN) injection 4 mg (4 mg Intravenous Given 07/25/18 0228)     Initial Impression / Assessment and Plan / ED Course  I have reviewed the triage vital signs and the nursing notes.  Pertinent labs & imaging results that were available during my care of the patient were reviewed by me and considered in my medical decision making (see chart for details).     Patient  presenting with acute onset left upper quadrant and left flank pain.  Labs are unremarkable today.  CT abdomen pelvis is negative.  Suspect passed kidney stone or other etiology to early to visualize on imaging on labs.  Patient given 2 L of normal saline and pain improved with Dilaudid.  Patient persists to be tachycardic but is clinically well-appearing.  Will discharge home with pain control with Percocet and Zofran.  I reviewed the [place] narcotic database and found no discrepancies.  Clear liquid diet and progress to bland foods.  Patient advised to follow-up with his PCP for recheck in 1 to 2 days.  He is given strict return precautions.  He understands and agrees with plan.  Patient discharged in satisfactory condition.  Patient also evaluated by Dr. Preston Fleeting who guided the patient's management and agrees with plan.  Final Clinical Impressions(s) / ED Diagnoses   Final diagnoses:  Left upper quadrant pain    ED Discharge Orders        Ordered    oxyCODONE-acetaminophen (PERCOCET/ROXICET) 5-325 MG  tablet  Every 6 hours PRN     07/25/18 0403    ondansetron (ZOFRAN) 4 MG tablet  Every 6 hours     07/25/18 0403       Emi Holes, PA-C 07/25/18 0753    Dione Booze, MD 07/28/18 1416    Dione Booze, MD 07/28/18 1419

## 2018-07-24 NOTE — ED Triage Notes (Signed)
Pt states that he was eating dinner and shortly after began to have severe L flank pain that radiates to abdomen, with nausea, denies urinary symptoms. Feels similar to his pancreatitis

## 2018-07-25 ENCOUNTER — Emergency Department (HOSPITAL_COMMUNITY): Payer: 59

## 2018-07-25 ENCOUNTER — Encounter: Payer: Self-pay | Admitting: Internal Medicine

## 2018-07-25 MED ORDER — SODIUM CHLORIDE 0.9 % IV BOLUS
1000.0000 mL | Freq: Once | INTRAVENOUS | Status: AC
  Administered 2018-07-25: 1000 mL via INTRAVENOUS

## 2018-07-25 MED ORDER — HYDROMORPHONE HCL 1 MG/ML IJ SOLN
1.0000 mg | Freq: Once | INTRAMUSCULAR | Status: AC
  Administered 2018-07-25: 1 mg via INTRAVENOUS
  Filled 2018-07-25: qty 1

## 2018-07-25 MED ORDER — ONDANSETRON HCL 4 MG PO TABS: 4.0000 mg | ORAL_TABLET | Freq: Four times a day (QID) | ORAL | 0 refills | Status: DC

## 2018-07-25 MED ORDER — ONDANSETRON HCL 4 MG/2ML IJ SOLN
4.0000 mg | Freq: Once | INTRAMUSCULAR | Status: AC
Start: 2018-07-25 — End: 2018-07-25
  Administered 2018-07-25: 4 mg via INTRAVENOUS
  Filled 2018-07-25: qty 2

## 2018-07-25 MED ORDER — OXYCODONE-ACETAMINOPHEN 5-325 MG PO TABS: 1.0000 | ORAL_TABLET | Freq: Four times a day (QID) | ORAL | 0 refills | Status: DC | PRN

## 2018-07-25 NOTE — ED Notes (Signed)
Preston FleetingGlick, MD made aware of high heart rate. Will continue to monitor.

## 2018-07-25 NOTE — Discharge Instructions (Addendum)
Medications: Percocet, Zofran  Treatment: Take 1-2 Percocet every 6 hours as needed for severe pain.  Take Zofran every 6 hours as needed for nausea or vomiting.  Begin with clear liquids only for the first few days and progress your diet with bland food.  Do not drink alcohol, drive, operate machinery or participate in any other potentially dangerous activities while taking opiate pain medication as it may make you sleepy. Do not take this medication with any other sedating medications, either prescription or over-the-counter. If you were prescribed Percocet or Vicodin, do not take these with acetaminophen (Tylenol) as it is already contained within these medications and overdose of Tylenol is dangerous.   This medication is an opiate (or narcotic) pain medication and can be habit forming.  Use it as little as possible to achieve adequate pain control.  Do not use or use it with extreme caution if you have a history of opiate abuse or dependence. This medication is intended for your use only - do not give any to anyone else and keep it in a secure place where nobody else, especially children, have access to it. It will also cause or worsen constipation, so you may want to consider taking an over-the-counter stool softener while you are taking this medication.  Follow-up: Please follow-up with your doctor in 1 to 2 days for recheck.  Please return to the emergency department if you develop any increasing pain, fever, intractable vomiting, or any other new or concerning symptom.

## 2018-07-27 ENCOUNTER — Other Ambulatory Visit: Payer: Self-pay

## 2018-07-27 ENCOUNTER — Ambulatory Visit: Payer: 59 | Admitting: Internal Medicine

## 2018-07-27 ENCOUNTER — Encounter: Payer: Self-pay | Admitting: Internal Medicine

## 2018-07-27 VITALS — BP 153/95 | HR 96 | Temp 98.7°F | Ht 63.5 in | Wt 182.5 lb

## 2018-07-27 DIAGNOSIS — Z8719 Personal history of other diseases of the digestive system: Secondary | ICD-10-CM

## 2018-07-27 DIAGNOSIS — G8929 Other chronic pain: Secondary | ICD-10-CM | POA: Diagnosis not present

## 2018-07-27 DIAGNOSIS — K5900 Constipation, unspecified: Secondary | ICD-10-CM

## 2018-07-27 DIAGNOSIS — R1012 Left upper quadrant pain: Secondary | ICD-10-CM | POA: Diagnosis not present

## 2018-07-27 DIAGNOSIS — R1011 Right upper quadrant pain: Secondary | ICD-10-CM

## 2018-07-27 DIAGNOSIS — Z905 Acquired absence of kidney: Secondary | ICD-10-CM

## 2018-07-27 DIAGNOSIS — Z9049 Acquired absence of other specified parts of digestive tract: Secondary | ICD-10-CM

## 2018-07-27 NOTE — Progress Notes (Signed)
   CC: Abdominal Pain  HPI: [name] is a 42 y.o. M presenting to the clinic for follow up visit after a recent ED stay on 07/24/18 for acute exacerbation of his chronic abdominal pain. In 2017, he had an extended admission at [facility] for acute pancreatitis which was caused by cholelithiasis. Since the event, he has had chronic abdominal pain that has not improved but was managed with pain clinic. On 07/24/18, he had an acute exacerbation of this chronic pain while sitting on the couch accompanied with nausea but no vomiting and his wife encouraged him to go to the ED to be evaluated. In the ED he was found to have no acute processes on CT and was discharged after being given pain control. Since his discharge, he states his pain has been at baseline and is no longer endorsing nausea. He still has the abdominal pain but it is not as severe as on 07/24/18. He has multiple medication for pain prescribed by his pain clinic but he states he tries to avoid using them as he works as a Associate Professorpharmacy tech and understands the dangers of overusing opioid medication. Denies any vomiting, fever, chills, chest pain, diarrhea.  Past Medical History:  Diagnosis Date  . Acute pancreatitis    1-8-17to 02-03-16 [facility]  . Bile leak   . Depression   . Diverticulosis   . GERD (gastroesophageal reflux disease)   . H/O seasonal allergies    OTC med used  . Hepatic steatosis   . Hypercholesteremia   . Hypertension    01-05-16 to 02-03-16 Hospitalized for pancreatitis, B/P med discontinued during that time.  . Pancreatic necrosis   . Pancreatic pseudocyst   . PONV (postoperative nausea and vomiting) 03-04-16   ponv after cholecystectomy   Review of Systems: Review of Systems  Constitutional: Negative for chills, fever and malaise/fatigue.  Respiratory: Negative for cough, sputum production and shortness of breath.   Cardiovascular: Negative for chest pain, palpitations and orthopnea.    Gastrointestinal: Positive for abdominal pain and constipation. Negative for diarrhea, heartburn, nausea and vomiting.  Neurological: Negative for dizziness, sensory change, weakness and headaches.     Physical Exam: Vitals:   07/27/18 1551  BP: (!) 153/95  Pulse: 96  Temp: 98.7 F (37.1 C)  TempSrc: Oral  SpO2: 96%  Weight: 182 lb 8 oz (82.8 kg)  Height: 5' 3.5" (1.613 m)   Physical Exam  Constitutional: He is oriented to person, place, and time. He appears well-developed and well-nourished. No distress.  Cardiovascular: Normal rate, regular rhythm, normal heart sounds and intact distal pulses.  Respiratory: Effort normal and breath sounds normal. No respiratory distress. He has no wheezes.  GI: Soft. Bowel sounds are normal. He exhibits no distension. There is tenderness (tenderness to deep palpation on both right and left upper quadrants. Pain greater on left ). There is no rebound and no guarding.  Neurological: He is alert and oriented to person, place, and time. He has normal reflexes. No cranial nerve deficit. Coordination normal.  Psychiatric: He has a normal mood and affect. His behavior is normal. Judgment and thought content normal.      Assessment & Plan:   See Encounters Tab for problem based charting.  Patient seen with Dr. Criselda PeachesMullen   -Judeth CornfieldJoshua Lee, PGY1

## 2018-07-27 NOTE — Patient Instructions (Signed)
Mr.Wiesen  Thank you for coming in to the clinic. We reviewed your lab results and tests from the ED visit and we agree that there's not much we can do. Please take your pain medication as prescribed and follow up with your Pain Clinic. Thank you.  -Judeth CornfieldJoshua Maat Kafer, MD

## 2018-07-28 NOTE — Assessment & Plan Note (Addendum)
-   Chronic abdominal pain after severe biliary pancreatitis s/p lap cholecystectomy in 2017 - Currently on Lyrica and percocet for pain - States he has upcoming appointment with pain clinic - States abdominal pain was acutely exacerbated which prompted his ED visit but is now manageable - Denies trauma, alcohol consumption, fatty meals  Acute on chronic abdominal pain 2/2 recurrent pancreatitis vs heartburn - C/w Lyrica and percocet prn - C/w Protonix 40mg  - PRN Zofran for nausea - Counseled on avoiding NSAID use as he only has 1 kidney - Will have him come back for fasting lipid panel to see triglyceride level - F/u with GI and Pain clinic

## 2018-07-29 ENCOUNTER — Other Ambulatory Visit (INDEPENDENT_AMBULATORY_CARE_PROVIDER_SITE_OTHER): Payer: 59

## 2018-07-29 DIAGNOSIS — R1011 Right upper quadrant pain: Secondary | ICD-10-CM | POA: Diagnosis not present

## 2018-07-29 DIAGNOSIS — G8929 Other chronic pain: Secondary | ICD-10-CM

## 2018-07-29 DIAGNOSIS — R1012 Left upper quadrant pain: Secondary | ICD-10-CM | POA: Diagnosis not present

## 2018-07-30 LAB — LIPID PANEL
Chol/HDL Ratio: 5.5 ratio — ABNORMAL HIGH (ref 0.0–5.0)
Cholesterol, Total: 177 mg/dL (ref 100–199)
HDL: 32 mg/dL — ABNORMAL LOW (ref >39)
LDL CALC: 89 mg/dL (ref 0–99)
Triglycerides: 282 mg/dL — ABNORMAL HIGH (ref 0–149)
VLDL CHOLESTEROL CAL: 56 mg/dL — AB (ref 5–40)

## 2018-08-02 NOTE — Progress Notes (Signed)
Internal Medicine Clinic Attending  I saw and evaluated the patient.  I personally confirmed the key portions of the history and exam documented by Dr. Lee and I reviewed pertinent patient test results.  The assessment, diagnosis, and plan were formulated together and I agree with the documentation in the resident's note.  

## 2018-08-04 ENCOUNTER — Telehealth: Payer: Self-pay | Admitting: Internal Medicine

## 2018-08-04 NOTE — Telephone Encounter (Signed)
Attempted to call patient on his mobile phone. Patient did not pick up. Left voicemail about his lipid panel not being significant enough to make any changes to his current management.

## 2018-08-19 ENCOUNTER — Other Ambulatory Visit: Payer: Self-pay | Admitting: Internal Medicine

## 2018-09-14 ENCOUNTER — Telehealth: Payer: Self-pay | Admitting: *Deleted

## 2018-09-14 NOTE — Telephone Encounter (Signed)
Received call from Meyer RusselNatalie Corbin, NP with [facility]. States she saw patient for initial visit last week for pancreatitis and pain management. Noticed he was on high dose desipramine and requested EKG. Saw EKG from 06/13/2018 in Epic and was concerned. She did not repeat EKG. She wants to ensure patient is being followed for this by PCP as she cannot address this. Patient has had 3 subsequent EKGs done in July 2019, [place]. Kinnie FeilL. [place]atte, RN, BSN

## 2018-09-15 NOTE — Telephone Encounter (Signed)
I reviewed his old EKG's with Dr. Heide SparkNarendra. We believe that his EKG shows small ST elevations which are more consistent with early repolarization. I would not suggest repeating EKG at this time. Thank you for this information.

## 2018-09-20 ENCOUNTER — Ambulatory Visit: Payer: 59 | Admitting: Internal Medicine

## 2018-09-20 VITALS — BP 135/90 | HR 97 | Temp 98.2°F | Wt 181.5 lb

## 2018-09-20 DIAGNOSIS — Z79899 Other long term (current) drug therapy: Secondary | ICD-10-CM

## 2018-09-20 DIAGNOSIS — I10 Essential (primary) hypertension: Secondary | ICD-10-CM | POA: Diagnosis not present

## 2018-09-20 DIAGNOSIS — H9201 Otalgia, right ear: Secondary | ICD-10-CM

## 2018-09-20 DIAGNOSIS — B9789 Other viral agents as the cause of diseases classified elsewhere: Secondary | ICD-10-CM

## 2018-09-20 DIAGNOSIS — J029 Acute pharyngitis, unspecified: Secondary | ICD-10-CM

## 2018-09-20 DIAGNOSIS — J039 Acute tonsillitis, unspecified: Secondary | ICD-10-CM | POA: Insufficient documentation

## 2018-09-20 DIAGNOSIS — J028 Acute pharyngitis due to other specified organisms: Principal | ICD-10-CM

## 2018-09-20 DIAGNOSIS — R0981 Nasal congestion: Secondary | ICD-10-CM | POA: Diagnosis not present

## 2018-09-20 MED ORDER — IBUPROFEN 400 MG PO TABS: 400.0000 mg | ORAL_TABLET | Freq: Four times a day (QID) | ORAL | 0 refills | Status: DC | PRN

## 2018-09-20 MED ORDER — SALINE SPRAY 0.65 % NA SOLN: 1.0000 | NASAL | 0 refills | Status: DC | PRN

## 2018-09-20 NOTE — Assessment & Plan Note (Signed)
BP Readings from Last 3 Encounters:  09/20/18 135/90  07/27/18 (!) 153/95  07/25/18 (!) 146/109   His blood pressure was mildly elevated but he has not taken his morning meds yet.  Continue current management.

## 2018-09-20 NOTE — Assessment & Plan Note (Signed)
His Symptoms more consistent with viral upper respiratory infection. His center score for strep throat was 2 because of his age being less than 45.  -We advised conservative and symptomatic management with ibuprofen, Zyrtec and saline nasal spray. -If his symptoms fail to resolve or he develop fever, will need a strep throat evaluation.

## 2018-09-20 NOTE — Progress Notes (Signed)
   CC: Sore throat and right ear pain for the past 2 days.  HPI:  [name] is a 42 y.o. gentleman with past medical history as listed below came to the clinic with complaint of sore throat and right ear pain for the past 2 days. Patient was also complaining of mild nasal congestion with some gold color secretions. Denies any fever, generalized malaise or cough. Has multiple coworkers with similar symptoms. He has not tried any over-the-counter symptomatic treatment. He denies any nausea, vomiting or diarrhea. Denies any urinary symptoms.  Past Medical History:  Diagnosis Date  . Acute pancreatitis    1-8-17to 02-03-16 [facility].  . Bile leak   . Depression   . Diverticulosis   . GERD (gastroesophageal reflux disease)   . H/O seasonal allergies    OTC med used  . Hepatic steatosis   . Hypercholesteremia   . Hypertension    01-05-16 to 02-03-16 Hospitalized for pancreatitis, B/P med discontinued during that time.  . Pancreatic necrosis   . Pancreatic pseudocyst   . PONV (postoperative nausea and vomiting) 03-04-16   ponv after cholecystectomy   Review of Systems: Negative except mentioned in HPI.  Physical Exam:  Vitals:   09/20/18 0921  BP: 135/90  Pulse: 97  Temp: 98.2 F (36.8 C)  TempSrc: Oral  SpO2: 100%  Weight: 181 lb 8 oz (82.3 kg)   General: Vital signs reviewed.  Patient is well-developed and well-nourished, in no acute distress and cooperative with exam.  HEENT: Normocephalic and atraumatic. Mild pharyngeal erythema with mild tonsillar enlargement, no exudate ,no nasal erythema or edema, no nasal discharge seen.  Right ear with small amount of fluid with normal-looking tympanic membrane. Cardiovascular: RRR, S1 normal, S2 normal, no murmurs, gallops, or rubs. Pulmonary/Chest: Clear to auscultation bilaterally, no wheezes, rales, or rhonchi. Abdominal: Soft, non-tender, non-distended, BS +,  Extremities: No lower extremity edema bilaterally,   pulses symmetric and intact bilaterally. No cyanosis or clubbing. Skin: Warm, dry and intact. No rashes or erythema. Psychiatric: Normal mood and affect. speech and behavior is normal. Cognition and memory are normal.  Assessment & Plan:   See Encounters Tab for problem based charting.  Patient discussed with Dr. Sandre Kittyaines.

## 2018-09-20 NOTE — Patient Instructions (Addendum)
Thank you for visiting clinic today. Looks like you are having viral upper respiratory symptoms. You can try ibuprofen 400 mg every 6-8 hourly, or Tylenol for your pain. Use saline nasal spray to clean up your nasal secretions. Take your Zyrtec daily as it will also help with your symptoms. You can try salt water gargles to help with your sore throat. Keep yourself well-hydrated. If your symptoms get worse or you start developing fever please come to the clinic.   Upper Respiratory Infection, Adult Most upper respiratory infections (URIs) are caused by a virus. A URI affects the nose, throat, and upper air passages. The most common type of URI is often called "the common cold." Follow these instructions at home:  Take medicines only as told by your doctor.  Gargle warm saltwater or take cough drops to comfort your throat as told by your doctor.  Use a warm mist humidifier or inhale steam from a shower to increase air moisture. This may make it easier to breathe.  Drink enough fluid to keep your pee (urine) clear or pale yellow.  Eat soups and other clear broths.  Have a healthy diet.  Rest as needed.  Go back to work when your fever is gone or your doctor says it is okay. ? You may need to stay home longer to avoid giving your URI to others. ? You can also wear a face mask and wash your hands often to prevent spread of the virus.  Use your inhaler more if you have asthma.  Do not use any tobacco products, including cigarettes, chewing tobacco, or electronic cigarettes. If you need help quitting, ask your doctor. Contact a doctor if:  You are getting worse, not better.  Your symptoms are not helped by medicine.  You have chills.  You are getting more short of breath.  You have brown or red mucus.  You have yellow or brown discharge from your nose.  You have pain in your face, especially when you bend forward.  You have a fever.  You have puffy (swollen) neck  glands.  You have pain while swallowing.  You have white areas in the back of your throat. Get help right away if:  You have very bad or constant: ? Headache. ? Ear pain. ? Pain in your forehead, behind your eyes, and over your cheekbones (sinus pain). ? Chest pain.  You have long-lasting (chronic) lung disease and any of the following: ? Wheezing. ? Long-lasting cough. ? Coughing up blood. ? A change in your usual mucus.  You have a stiff neck.  You have changes in your: ? Vision. ? Hearing. ? Thinking. ? Mood. This information is not intended to replace advice given to you by your health care provider. Make sure you discuss any questions you have with your health care provider. Document Released: 06/01/2008 Document Revised: 08/16/2016 Document Reviewed: 03/21/2014 Elsevier Interactive Patient Education  2018 [employer]

## 2018-09-23 NOTE — Progress Notes (Signed)
Internal Medicine Clinic Attending  Case discussed with Dr. Nelson Chimes at the time of the visit.  We reviewed the resident's history and exam and pertinent patient test results.  I agree with the assessment, diagnosis, and plan of care documented in the resident's note.  Here for sore throat, congestion, ear pain. Likely viral URI, will treat with supportive care. Although Centor score is 2 (for age and tonsillar swelling), unlikely strep pharyngitis with rhinorrhea, reasonable to proceed without testing.  Jessy Oto, M.D., Ph.D.

## 2018-09-29 ENCOUNTER — Other Ambulatory Visit: Payer: Self-pay

## 2018-09-29 ENCOUNTER — Telehealth: Payer: Self-pay | Admitting: *Deleted

## 2018-09-29 ENCOUNTER — Ambulatory Visit: Payer: 59 | Admitting: Internal Medicine

## 2018-09-29 ENCOUNTER — Encounter: Payer: Self-pay | Admitting: Internal Medicine

## 2018-09-29 VITALS — BP 137/94 | HR 93 | Temp 98.1°F | Ht 63.5 in | Wt 180.5 lb

## 2018-09-29 DIAGNOSIS — J029 Acute pharyngitis, unspecified: Secondary | ICD-10-CM

## 2018-09-29 DIAGNOSIS — J039 Acute tonsillitis, unspecified: Secondary | ICD-10-CM | POA: Diagnosis not present

## 2018-09-29 LAB — GROUP A STREP BY PCR: GROUP A STREP BY PCR: NOT DETECTED

## 2018-09-29 NOTE — Progress Notes (Signed)
   CC: sore throat  HPI:  [name] is a 42 y.o. male with past medical history outlined below here for sore throat follow up. For the details of today's visit, please refer to the assessment and plan.  Past Medical History:  Diagnosis Date  . Acute pancreatitis    1-8-17to 02-03-16 Hospital stay at [facility].  . Bile leak   . Depression   . Diverticulosis   . GERD (gastroesophageal reflux disease)   . H/O seasonal allergies    OTC med used  . Hepatic steatosis   . Hypercholesteremia   . Hypertension    01-05-16 to 02-03-16 Hospitalized for pancreatitis, B/P med discontinued during that time.  . Pancreatic necrosis   . Pancreatic pseudocyst   . PONV (postoperative nausea and vomiting) 03-04-16   ponv after cholecystectomy    Review of Systems  Constitutional: Negative for chills and fever.  HENT: Positive for ear pain and sore throat.     Physical Exam:  Vitals:   09/29/18 0852  BP: (!) 137/94  Pulse: 93  Temp: 98.1 F (36.7 C)  TempSrc: Oral  SpO2: 100%  Weight: 180 lb 8 oz (81.9 kg)  Height: 5' 3.5" (1.613 m)    Constitutional: NAD, appears comfortable HEENT: Right tympanic membrane erythematous, no drainage. Left tympanic membrane apperas normal normal. Bilateral enlarged tonsils.  Cardiovascular: RRR, no murmurs, rubs, or gallops.  Pulmonary/Chest: CTAB, no wheezes, rales, or rhonchi.  Skin: No rashes or erythema    Assessment & Plan:   See Encounters Tab for problem based charting.  Patient discussed with Dr. Heide Spark

## 2018-09-29 NOTE — Telephone Encounter (Signed)
Call to patient per request of Dr. Heide Spark.  Throat Culture was negative for Strep. No antibiotics needed.  Message left for patient to call the Clinics for results of Throat Culture.  Angelina Ok, RN 09/29/2018 11:14 AM.

## 2018-09-29 NOTE — Patient Instructions (Signed)
Mr. Flagg,  It was a pleasure to meet you. I am sorry to hear you are still not feeling well. I will call you shortly when the results of your strep test.   If you have any questions or concerns, call our clinic at [phone] or after hours call [phone] and ask for the internal medicine resident on call.   Thank you!  Dr. Antony Contras

## 2018-09-29 NOTE — Assessment & Plan Note (Signed)
Patient is here for follow up of sore throat. He reports a painful sore throat now for over a week. Has associated right ear pain. On exam he has bilateral enlargement of his tonsils with erythema, no drainage. Right tympanic membrane is also erythematous. 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 Denies fevers, no cough. Centor score is 2. -- Rapid strep swab

## 2018-09-30 NOTE — Progress Notes (Signed)
Internal Medicine Clinic Attending  Case discussed with Dr. Guilloud at the time of the visit.  We reviewed the resident's history and exam and pertinent patient test results.  I agree with the assessment, diagnosis, and plan of care documented in the resident's note.  

## 2018-11-29 ENCOUNTER — Other Ambulatory Visit: Payer: Self-pay | Admitting: *Deleted

## 2018-12-01 ENCOUNTER — Encounter: Payer: Self-pay | Admitting: Internal Medicine

## 2018-12-01 MED ORDER — PANTOPRAZOLE SODIUM 40 MG PO TBEC: DELAYED_RELEASE_TABLET | ORAL | 0 refills | Status: DC

## 2018-12-01 NOTE — Telephone Encounter (Signed)
Front office - please schedule pt an appt w/PCP in next 4 - 8 weeks. Thanks

## 2018-12-01 NOTE — Telephone Encounter (Signed)
I will refill his pantoprazole as a 90 day supply. He will need to make an appointment with me with 4-8 weeks to establish care.

## 2018-12-06 NOTE — Telephone Encounter (Signed)
Appt sch with PCP on 01/25/2018.

## 2019-01-02 ENCOUNTER — Encounter: Payer: Self-pay | Admitting: Internal Medicine

## 2019-01-03 ENCOUNTER — Other Ambulatory Visit: Payer: Self-pay | Admitting: *Deleted

## 2019-01-03 DIAGNOSIS — I10 Essential (primary) hypertension: Secondary | ICD-10-CM

## 2019-01-03 NOTE — Telephone Encounter (Signed)
I asked Drew Prince to f/u to see if need PA.

## 2019-01-03 NOTE — Telephone Encounter (Addendum)
Call to [facility] at [phone].  Patient will need to get 90 supplies of the Metoprolol 100 mg and use a [facility] or [facility] to get the medication with out a co-pay from now on.    Representative stated that patient could get a 1 time 30 day refill if he calls the company at [phone] and tells them that he is getting a 30 day supply for 1 time.  Patient will be able to pick up from the [facility].  Going forward he will need to go to a [facility] or mail order.   Call to patient left message for him to call the Clinics for an explanation and instructions on how to get his medication.  Angelina Ok, RN 01/03/2019 2:05 PM.

## 2019-01-06 NOTE — Telephone Encounter (Signed)
Called pt to explain what he needs to do for metoprolol refill - no answer; left message.

## 2019-01-09 MED ORDER — METOPROLOL SUCCINATE ER 100 MG PO TB24: 100.0000 mg | ORAL_TABLET | Freq: Every day | ORAL | 3 refills | Status: DC

## 2019-01-25 ENCOUNTER — Other Ambulatory Visit: Payer: Self-pay

## 2019-01-25 ENCOUNTER — Encounter: Payer: Self-pay | Admitting: Internal Medicine

## 2019-01-25 ENCOUNTER — Ambulatory Visit: Payer: 59 | Admitting: Internal Medicine

## 2019-01-25 VITALS — BP 162/98 | HR 80 | Temp 98.4°F | Ht 63.5 in | Wt 185.5 lb

## 2019-01-25 DIAGNOSIS — R1011 Right upper quadrant pain: Secondary | ICD-10-CM

## 2019-01-25 DIAGNOSIS — R1012 Left upper quadrant pain: Secondary | ICD-10-CM | POA: Diagnosis not present

## 2019-01-25 DIAGNOSIS — I10 Essential (primary) hypertension: Secondary | ICD-10-CM

## 2019-01-25 DIAGNOSIS — Z931 Gastrostomy status: Secondary | ICD-10-CM

## 2019-01-25 DIAGNOSIS — E785 Hyperlipidemia, unspecified: Secondary | ICD-10-CM

## 2019-01-25 DIAGNOSIS — F329 Major depressive disorder, single episode, unspecified: Secondary | ICD-10-CM

## 2019-01-25 DIAGNOSIS — G8929 Other chronic pain: Secondary | ICD-10-CM

## 2019-01-25 DIAGNOSIS — K8511 Biliary acute pancreatitis with uninfected necrosis: Secondary | ICD-10-CM

## 2019-01-25 DIAGNOSIS — F32A Depression, unspecified: Secondary | ICD-10-CM

## 2019-01-25 DIAGNOSIS — Z9049 Acquired absence of other specified parts of digestive tract: Secondary | ICD-10-CM

## 2019-01-25 DIAGNOSIS — R102 Pelvic and perineal pain: Secondary | ICD-10-CM

## 2019-01-25 DIAGNOSIS — Z79899 Other long term (current) drug therapy: Secondary | ICD-10-CM

## 2019-01-25 DIAGNOSIS — R350 Frequency of micturition: Secondary | ICD-10-CM

## 2019-01-25 DIAGNOSIS — F339 Major depressive disorder, recurrent, unspecified: Secondary | ICD-10-CM

## 2019-01-25 DIAGNOSIS — R300 Dysuria: Secondary | ICD-10-CM | POA: Diagnosis not present

## 2019-01-25 DIAGNOSIS — R1031 Right lower quadrant pain: Secondary | ICD-10-CM

## 2019-01-25 DIAGNOSIS — K219 Gastro-esophageal reflux disease without esophagitis: Secondary | ICD-10-CM

## 2019-01-25 LAB — POCT URINALYSIS DIPSTICK
Bilirubin, UA: NEGATIVE
Blood, UA: NEGATIVE
GLUCOSE UA: NEGATIVE
KETONES UA: NEGATIVE
LEUKOCYTES UA: NEGATIVE
Nitrite, UA: NEGATIVE
Protein, UA: NEGATIVE
SPEC GRAV UA: 1.02 (ref 1.010–1.025)
Urobilinogen, UA: 0.2 E.U./dL
pH, UA: 6 (ref 5.0–8.0)

## 2019-01-25 MED ORDER — TRIAMTERENE-HCTZ 37.5-25 MG PO TABS: 1.0000 | ORAL_TABLET | Freq: Every day | ORAL | 2 refills | Status: DC

## 2019-01-25 MED ORDER — SERTRALINE HCL 50 MG PO TABS
50.0000 mg | ORAL_TABLET | Freq: Every day | ORAL | 3 refills | Status: DC
Start: 2019-01-25 — End: 2019-05-29

## 2019-01-25 NOTE — Assessment & Plan Note (Signed)
Assessment: PHQ-9 today 11 with symptoms consistent with Major Depressive Disorder.  Plan: 1. Start sertraline 50 mg QD. Counseled patient that it may take up to 6-8 weeks before feeling full affect.

## 2019-01-25 NOTE — Progress Notes (Signed)
Medicine attending: Medical history, presenting problems, physical findings, and medications, reviewed with resident physician Dr Jamie Prince on the day of the patient visit and I concur with her evaluation and management plan. 

## 2019-01-25 NOTE — Progress Notes (Signed)
CC: Dysuria  HPI:  [name] is a 43 y.o. 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  Dysuria: [name] reports a 3 to 4-week history of intermittent dysuria.  He has also had perineal pain which is slowly started to migrate up to his right flank.  Additionally he has had increased frequency over the last several days.  He denies hematuria, fevers, penile discharge, scrotal swelling, rashes. He has one sexual partner (his wife) and does not have any high-risk sexual behavior. He denies UTI in the past.  Chronic Bilateral Abdominal Pain: He suffers from chronic bilateral abdominal pain secondary to pancreatitis and subsequent surgeries.  He is being seen by pain management who discontinued much of his medications including Lyrica.  He is very rarely taking Percocet for the pain.  He states that the pain is dull and constant.  Depression: PHQ-9 today 11. Was previously on Cymbalta for this chronic abdominal pain given by his gastroenterologist.  He states that it was discontinued because of suicidal thoughts.  He states that he has had increasingly worse mood symptoms.  He no longer finds enjoyment in a lot of the activities that he used to like to do.  He feels much sadder than he has in the past.  He denies suicidal and homicidal ideation.  He does have a good appetite and is sleeping well.  Hypertension: Currently taking metoprolol 100 mg QD.  Blood pressure elevated today to 162/98.  Per chart review he has had multiple BP readings in the past above goal SBP >130.    Past Medical History:  Diagnosis Date  . Acute pancreatitis    1-8-17to 02-03-16 Hospital stay at [facility].  . Bile leak   . Depression   . Diverticulosis   . GERD (gastroesophageal reflux disease)   . H/O seasonal allergies    OTC med used  . Hepatic  steatosis   . Hypercholesteremia   . Hypertension    01-05-16 to 02-03-16 Hospitalized for pancreatitis, B/P med discontinued during that time.  . Pancreatic necrosis   . Pancreatic pseudocyst   . PONV (postoperative nausea and vomiting) 03-04-16   ponv after cholecystectomy   Review of Systems:  Review of Systems  Constitutional: Negative for chills and fever.  HENT: Negative for congestion and sore throat.   Respiratory: Negative for cough and shortness of breath.   Cardiovascular: Negative for chest pain and palpitations.  Genitourinary: Positive for dysuria and frequency. Negative for flank pain, hematuria and urgency.  Neurological: Negative for dizziness and headaches.  Psychiatric/Behavioral: Positive for depression. Negative for memory loss, substance abuse and suicidal ideas. The patient does not have insomnia.     Physical Exam:  Vitals:   01/25/19 1342  BP: (!) 162/98  Pulse: 80  Temp: 98.4 F (36.9 C)  TempSrc: Oral  SpO2: 98%  Weight: 185 lb 8 oz (84.1 kg)  Height: 5' 3.5" (1.613 m)   Physical Exam Vitals signs and nursing note reviewed.  Constitutional:      Appearance: Normal appearance.  HENT:     Head: Normocephalic and atraumatic.  Eyes:     Conjunctiva/sclera: Conjunctivae normal.     Pupils: Pupils are equal, round, and reactive to light.  Cardiovascular:     Rate and Rhythm: Normal rate and regular rhythm.     Pulses: Normal pulses.     Heart sounds: Normal  heart sounds.  Pulmonary:     Effort: Pulmonary effort is normal. No respiratory distress.     Breath sounds: Normal breath sounds.  Abdominal:     General: Abdomen is flat. Bowel sounds are normal.     Palpations: Abdomen is soft.     Comments: Mild tenderness to palpation of RLQ.  Musculoskeletal:        General: No swelling or tenderness.     Right lower leg: No edema.     Left lower leg: No edema.     Comments: Mild bilateral CVA tenderness.  Skin:    General: Skin is warm and dry.    Neurological:     Mental Status: He is alert and oriented to person, place, and time.  Psychiatric:     Comments: Depressed mood. Normal behavior.     Assessment & Plan:   See Encounters Tab for problem based charting.  Patient discussed with Dr. Cyndie Chime

## 2019-01-25 NOTE — Patient Instructions (Signed)
I will call you with the results of your urinalysis.  

## 2019-01-25 NOTE — Assessment & Plan Note (Addendum)
Assessment: He has had a 3-4 week history of intermittent dysuria and increased frequency. POCT urinalysis did not show signs of a UTI. Unclear etiology. I have advised him to increase his fluid intake.   Plan: 1. Continue to monitor, if he continues to have symptoms please consider STI testing (low yield bc of no risky sexual behavior but worth exploring).

## 2019-01-25 NOTE — Assessment & Plan Note (Signed)
Assessment: Working with pain management. Off all medications other than rare Percocet use. He does continue to have chronic abdominal pain.  Plan: 1. Start sertraline for abdominal pain as well as con-commitant MDD.

## 2019-01-25 NOTE — Assessment & Plan Note (Signed)
Assessment: Elevated blood pressure despite use of metoprolol 100 mg QD. Will switch to triamterene-HCTZ for better control.  Plan: 1. Discontinue metoprolol 2. Start triamterene-HCTZ 37.5-25 mg QD 3. Follow-up in 1 month for BP check

## 2019-01-26 LAB — URINALYSIS, ROUTINE W REFLEX MICROSCOPIC
BILIRUBIN UA: NEGATIVE
Glucose, UA: NEGATIVE
Ketones, UA: NEGATIVE
Leukocytes, UA: NEGATIVE
Nitrite, UA: NEGATIVE
PH UA: 6 (ref 5.0–7.5)
PROTEIN UA: NEGATIVE
RBC UA: NEGATIVE
Specific Gravity, UA: 1.012 (ref 1.005–1.030)
UUROB: 0.2 mg/dL (ref 0.2–1.0)

## 2019-02-28 DIAGNOSIS — G894 Chronic pain syndrome: Secondary | ICD-10-CM | POA: Diagnosis not present

## 2019-02-28 DIAGNOSIS — R1084 Generalized abdominal pain: Secondary | ICD-10-CM | POA: Diagnosis not present

## 2019-02-28 DIAGNOSIS — R1012 Left upper quadrant pain: Secondary | ICD-10-CM | POA: Diagnosis not present

## 2019-02-28 DIAGNOSIS — G8929 Other chronic pain: Secondary | ICD-10-CM | POA: Diagnosis not present

## 2019-02-28 DIAGNOSIS — R1011 Right upper quadrant pain: Secondary | ICD-10-CM | POA: Diagnosis not present

## 2019-04-05 ENCOUNTER — Other Ambulatory Visit: Payer: Self-pay | Admitting: Internal Medicine

## 2019-04-05 MED ORDER — PANTOPRAZOLE SODIUM 40 MG PO TBEC: DELAYED_RELEASE_TABLET | ORAL | 2 refills | Status: DC

## 2019-04-17 DIAGNOSIS — G8929 Other chronic pain: Secondary | ICD-10-CM | POA: Diagnosis not present

## 2019-04-17 DIAGNOSIS — R109 Unspecified abdominal pain: Secondary | ICD-10-CM | POA: Diagnosis not present

## 2019-04-30 ENCOUNTER — Other Ambulatory Visit: Payer: Self-pay | Admitting: Internal Medicine

## 2019-04-30 DIAGNOSIS — I10 Essential (primary) hypertension: Secondary | ICD-10-CM

## 2019-05-02 MED ORDER — TRIAMTERENE-HCTZ 37.5-25 MG PO TABS: 1.0000 | ORAL_TABLET | Freq: Every day | ORAL | 2 refills | Status: DC

## 2019-05-08 ENCOUNTER — Encounter: Payer: Self-pay | Admitting: Internal Medicine

## 2019-05-08 DIAGNOSIS — K5900 Constipation, unspecified: Secondary | ICD-10-CM | POA: Diagnosis not present

## 2019-05-08 DIAGNOSIS — R109 Unspecified abdominal pain: Secondary | ICD-10-CM | POA: Diagnosis not present

## 2019-05-08 DIAGNOSIS — G8929 Other chronic pain: Secondary | ICD-10-CM | POA: Diagnosis not present

## 2019-05-08 DIAGNOSIS — R0781 Pleurodynia: Secondary | ICD-10-CM | POA: Diagnosis not present

## 2019-05-26 ENCOUNTER — Other Ambulatory Visit: Payer: Self-pay | Admitting: Internal Medicine

## 2019-05-26 DIAGNOSIS — F329 Major depressive disorder, single episode, unspecified: Secondary | ICD-10-CM

## 2019-05-26 DIAGNOSIS — F32A Depression, unspecified: Secondary | ICD-10-CM

## 2019-06-06 ENCOUNTER — Encounter: Payer: Self-pay | Admitting: Internal Medicine

## 2019-06-06 ENCOUNTER — Other Ambulatory Visit: Payer: Self-pay

## 2019-06-06 ENCOUNTER — Ambulatory Visit: Payer: BC Managed Care – PPO | Admitting: Internal Medicine

## 2019-06-06 VITALS — BP 145/109 | HR 97 | Temp 98.2°F | Ht 63.5 in | Wt 183.4 lb

## 2019-06-06 DIAGNOSIS — R1011 Right upper quadrant pain: Secondary | ICD-10-CM

## 2019-06-06 DIAGNOSIS — I10 Essential (primary) hypertension: Secondary | ICD-10-CM

## 2019-06-06 DIAGNOSIS — R1012 Left upper quadrant pain: Secondary | ICD-10-CM

## 2019-06-06 DIAGNOSIS — Z79899 Other long term (current) drug therapy: Secondary | ICD-10-CM

## 2019-06-06 DIAGNOSIS — F329 Major depressive disorder, single episode, unspecified: Secondary | ICD-10-CM

## 2019-06-06 DIAGNOSIS — G8929 Other chronic pain: Secondary | ICD-10-CM

## 2019-06-06 DIAGNOSIS — F32A Depression, unspecified: Secondary | ICD-10-CM

## 2019-06-06 MED ORDER — LABETALOL HCL 100 MG PO TABS
100.0000 mg | ORAL_TABLET | Freq: Two times a day (BID) | ORAL | 2 refills | Status: DC
Start: 2019-06-06 — End: 2019-09-08

## 2019-06-06 MED ORDER — SERTRALINE HCL 100 MG PO TABS: 100.0000 mg | ORAL_TABLET | Freq: Every day | ORAL | 2 refills | Status: DC

## 2019-06-06 NOTE — Assessment & Plan Note (Addendum)
Patient states his symptoms have not significantly improved on current medication dose. He denies SI. We are increasing dose of Sertraline for this and for secondary indication of pain control (based on recommendation by his pain medicine physician).  - Increase Sertraline to 100mg  Daily - Follow up in 6 months

## 2019-06-06 NOTE — Assessment & Plan Note (Addendum)
BP elevated today to 143/106. He was started on Triamterene-HCTZ 37.5-25mg  Daily at last visit. Will need to add additional agent today (will not increase current med as HCTZ above 25mg  has minimal effect and will increased risk of side effects with increased triamterene dose).   He has tried Lisinopril (dc due to pancreatitis), Amlodipine (d/c due to possible side effect of cramping), and Metoprolol (tolerated but was not controlling BP). Will add BB as he has tolerated this in the past; will try labetalol as it has more BP effect. - Continue Triamterene-HCTZ 37.5-25mg  Daily - Start Labetalol 100mg  BID - BMP to evaluate K on Triamterene - Follow up at PCP or [facility] visit in about 6 weeks

## 2019-06-06 NOTE — Assessment & Plan Note (Signed)
Patient follows with pain management through [facility]. He had been taking Butrans, but has been weaned off this. He is now treated primarily with Sertraline for dual indication of this and Depression. - Follow up with Pain Medicine - Follow up with pain psychology  - Increase Sertraline to 100mg  Daily - Per Pain medicine: PRN Senna

## 2019-06-06 NOTE — Progress Notes (Signed)
   CC: Medication questions, Hypertension, Depression, Chronic Pain  HPI:   [name] is a 43 y.o. M with PMHx listed below presenting for Medication questions, Hypertension, Depression, Chronic Pain. Please see the A&P for the status of the patient's chronic medical problems.  Past Medical History:  Diagnosis Date  . Acute pancreatitis    1-8-17to 02-03-16 Hospital stay at [facility].  . Bile leak   . Depression   . Diverticulosis   . GERD (gastroesophageal reflux disease)   . H/O seasonal allergies    OTC med used  . Hepatic steatosis   . Hypercholesteremia   . Hypertension    01-05-16 to 02-03-16 Hospitalized for pancreatitis, B/P med discontinued during that time.  . Pancreatic necrosis   . Pancreatic pseudocyst   . PONV (postoperative nausea and vomiting) 03-04-16   ponv after cholecystectomy   Review of Systems:  Performed and all others negative.  Physical Exam:  Vitals:   06/06/19 0851  BP: (!) 143/106  Pulse: 98  Temp: 98.2 F (36.8 C)  SpO2: 98%  Weight: 183 lb 6.4 oz (83.2 kg)  Height: 5' 3.5" (1.613 m)   Physical Exam Constitutional:      General: He is not in acute distress.    Appearance: Normal appearance.  Cardiovascular:     Rate and Rhythm: Normal rate and regular rhythm.     Pulses: Normal pulses.     Heart sounds: Normal heart sounds.  Pulmonary:     Effort: Pulmonary effort is normal. No respiratory distress.     Breath sounds: Normal breath sounds.  Abdominal:     General: Bowel sounds are normal. There is no distension.     Palpations: Abdomen is soft.     Tenderness: There is no abdominal tenderness.  Musculoskeletal:        General: No swelling or deformity.  Skin:    General: Skin is warm and dry.  Neurological:     General: No focal deficit present.     Mental Status: Mental status is at baseline.     Assessment & Plan:   See Encounters Tab for problem based charting.  Patient discussed with Dr. Daryll Drown

## 2019-06-06 NOTE — Patient Instructions (Addendum)
Thank you for allowing [name] to care for you  For your high blood pressure - BP elevated today at 143/106 - Continue Maxzide 37.5-25mg  Daily - Start Labetalol 100mg  Twice a day  For your Depression and Pain - We will increase Sertraline dose to 100mg  Daily to target both depression symptoms and Pain  We are checking labs, you will be contacted with the results  Please follow up with PCP or in our acute care clinic in about 6 weeks.

## 2019-06-07 ENCOUNTER — Encounter: Payer: Self-pay | Admitting: Internal Medicine

## 2019-06-07 NOTE — Progress Notes (Signed)
Internal Medicine Clinic Attending  Case discussed with Dr. Melvin soon after the resident saw the patient.  We reviewed the resident's history and exam and pertinent patient test results.  I agree with the assessment, diagnosis, and plan of care documented in the resident's note.   

## 2019-06-07 NOTE — Progress Notes (Signed)
Letter sent with normal BMP results  °

## 2019-06-08 LAB — BMP8+ANION GAP
Anion Gap: 16 mmol/L (ref 10.0–18.0)
BUN/Creatinine Ratio: 15 (ref 9–20)
BUN: 17 mg/dL (ref 6–24)
CO2: 23 mmol/L (ref 20–29)
Calcium: 9.9 mg/dL (ref 8.7–10.2)
Chloride: 98 mmol/L (ref 96–106)
Creatinine, Ser: 1.14 mg/dL (ref 0.76–1.27)
GFR calc Af Amer: 90 mL/min/{1.73_m2} (ref >59)
GFR calc non Af Amer: 78 mL/min/{1.73_m2} (ref >59)
Glucose: 103 mg/dL — ABNORMAL HIGH (ref 65–99)
Potassium: 4 mmol/L (ref 3.5–5.2)
Sodium: 137 mmol/L (ref 134–144)

## 2019-06-14 DIAGNOSIS — Z8719 Personal history of other diseases of the digestive system: Secondary | ICD-10-CM | POA: Diagnosis not present

## 2019-06-14 DIAGNOSIS — R1011 Right upper quadrant pain: Secondary | ICD-10-CM | POA: Diagnosis not present

## 2019-06-14 DIAGNOSIS — G894 Chronic pain syndrome: Secondary | ICD-10-CM | POA: Diagnosis not present

## 2019-06-14 DIAGNOSIS — M940 Chondrocostal junction syndrome [Tietze]: Secondary | ICD-10-CM | POA: Diagnosis not present

## 2019-06-18 ENCOUNTER — Encounter: Payer: Self-pay | Admitting: *Deleted

## 2019-07-27 ENCOUNTER — Encounter: Payer: Self-pay | Admitting: Internal Medicine

## 2019-08-09 ENCOUNTER — Encounter: Payer: Self-pay | Admitting: Internal Medicine

## 2019-08-12 DIAGNOSIS — Z20828 Contact with and (suspected) exposure to other viral communicable diseases: Secondary | ICD-10-CM | POA: Diagnosis not present

## 2019-08-14 DIAGNOSIS — Z20828 Contact with and (suspected) exposure to other viral communicable diseases: Secondary | ICD-10-CM | POA: Diagnosis not present

## 2019-08-14 DIAGNOSIS — R05 Cough: Secondary | ICD-10-CM | POA: Diagnosis not present

## 2019-08-17 ENCOUNTER — Telehealth: Payer: Self-pay | Admitting: *Deleted

## 2019-08-17 NOTE — Telephone Encounter (Signed)
Appointment Request From: Harrell Gave  With Provider: Neva Seat, MD [facility]]  Preferred Date Range: 08/14/2019 - 08/14/2019  Preferred Times: Any Time  Reason for visit: Request an Appointment  Comments: possible Covid-19 exposure and testing    I called pt - stated he went to an urgent care and was tested; stated it was negative.

## 2019-08-29 ENCOUNTER — Encounter: Payer: Self-pay | Admitting: Internal Medicine

## 2019-09-07 ENCOUNTER — Ambulatory Visit (HOSPITAL_COMMUNITY): Payer: BC Managed Care – PPO | Attending: Internal Medicine

## 2019-09-07 ENCOUNTER — Ambulatory Visit: Payer: BC Managed Care – PPO | Admitting: Internal Medicine

## 2019-09-07 ENCOUNTER — Other Ambulatory Visit: Payer: Self-pay

## 2019-09-07 ENCOUNTER — Encounter: Payer: Self-pay | Admitting: Internal Medicine

## 2019-09-07 VITALS — BP 139/100 | HR 96 | Temp 98.6°F | Ht 64.0 in | Wt 179.8 lb

## 2019-09-07 DIAGNOSIS — G4700 Insomnia, unspecified: Secondary | ICD-10-CM

## 2019-09-07 DIAGNOSIS — I10 Essential (primary) hypertension: Secondary | ICD-10-CM

## 2019-09-07 DIAGNOSIS — F419 Anxiety disorder, unspecified: Secondary | ICD-10-CM | POA: Diagnosis not present

## 2019-09-07 DIAGNOSIS — Z539 Procedure and treatment not carried out, unspecified reason: Secondary | ICD-10-CM | POA: Diagnosis not present

## 2019-09-07 DIAGNOSIS — F329 Major depressive disorder, single episode, unspecified: Secondary | ICD-10-CM

## 2019-09-07 DIAGNOSIS — R0789 Other chest pain: Secondary | ICD-10-CM

## 2019-09-07 DIAGNOSIS — R079 Chest pain, unspecified: Secondary | ICD-10-CM | POA: Diagnosis not present

## 2019-09-07 DIAGNOSIS — G8929 Other chronic pain: Secondary | ICD-10-CM

## 2019-09-07 DIAGNOSIS — R12 Heartburn: Secondary | ICD-10-CM

## 2019-09-07 DIAGNOSIS — Z593 Problems related to living in residential institution: Secondary | ICD-10-CM | POA: Diagnosis not present

## 2019-09-07 DIAGNOSIS — F32A Depression, unspecified: Secondary | ICD-10-CM

## 2019-09-07 DIAGNOSIS — R0782 Intercostal pain: Secondary | ICD-10-CM

## 2019-09-07 DIAGNOSIS — K219 Gastro-esophageal reflux disease without esophagitis: Secondary | ICD-10-CM

## 2019-09-07 DIAGNOSIS — Z79899 Other long term (current) drug therapy: Secondary | ICD-10-CM

## 2019-09-07 MED ORDER — TRAZODONE HCL 50 MG PO TABS: 50.0000 mg | ORAL_TABLET | Freq: Every day | ORAL | 1 refills | Status: DC

## 2019-09-07 NOTE — Patient Instructions (Signed)
It was a pleasure meeting you today. Thank you for taking the time to visit. I will place a referral to psychiatry to help with medication choices. I will also send in trazodone for you. You can start with 50 mg and let me know how that works. It will make you sleepy so don't plan on driving anywhere! Take care and let me know if anything comes up in the meantime!

## 2019-09-07 NOTE — Progress Notes (Signed)
   CC: anxiety  HPI:  [name] is a 43 y.o. male who presents for Please see problem based assessment and plan for additional details.     Past Medical History:  Diagnosis Date  . Acute pancreatitis    1-8-17to 02-03-16 Hospital stay at [facility].  . Bile leak   . Depression   . Diverticulosis   . GERD (gastroesophageal reflux disease)   . H/O seasonal allergies    OTC med used  . Hepatic steatosis   . Hypercholesteremia   . Hypertension    01-05-16 to 02-03-16 Hospitalized for pancreatitis, B/P med discontinued during that time.  . Pancreatic necrosis   . Pancreatic pseudocyst   . PONV (postoperative nausea and vomiting) 03-04-16   ponv after cholecystectomy    Review of Systems  Constitutional: Negative for chills and fever.  HENT: Negative for congestion, sinus pain and sore throat.   Respiratory: Negative for cough and shortness of breath.   Cardiovascular: Positive for chest pain. Negative for palpitations.  Gastrointestinal: Positive for heartburn. Negative for nausea and vomiting.  Psychiatric/Behavioral: Positive for depression. Negative for hallucinations and substance abuse. The patient is nervous/anxious and has insomnia.        No suicidal plans      Physical Exam:  Vitals:   09/07/19 1444  BP: (!) 139/100  Pulse: 96  Temp: 98.6 F (37 C)  TempSrc: Oral  SpO2: 98%  Weight: 179 lb 12.8 oz (81.6 kg)  Height: 5\' 4"  (1.626 m)    GENERAL: well appearing, in no apparent distress CARDIAC: heart regular rate and rhythm PULMONARY: lung sounds clear to auscultation SKIN: no rash or lesion on limited exam Psych: depressed affect. Endorses suicidal thoughts in the past but denies any current plan   Assessment & Plan:   See Encounters Tab for problem based charting.  Pertinent labs & imaging results that were available during my care of the patient were reviewed by me and considered in my medical decision making  Patient is in agreement with the plan  and endorses no further questions at this time.  Patient seen with Dr. Adolm Joseph, MD Internal Medicine Resident-PGY1 09/07/19

## 2019-09-08 ENCOUNTER — Encounter: Payer: Self-pay | Admitting: Internal Medicine

## 2019-09-08 DIAGNOSIS — R079 Chest pain, unspecified: Secondary | ICD-10-CM | POA: Insufficient documentation

## 2019-09-08 MED ORDER — PANTOPRAZOLE SODIUM 40 MG PO TBEC: DELAYED_RELEASE_TABLET | ORAL | 2 refills | Status: DC

## 2019-09-08 MED ORDER — TRIAMTERENE-HCTZ 37.5-25 MG PO TABS: 1.0000 | ORAL_TABLET | Freq: Every day | ORAL | 2 refills | Status: DC

## 2019-09-08 MED ORDER — LABETALOL HCL 100 MG PO TABS: 50.0000 mg | ORAL_TABLET | Freq: Two times a day (BID) | ORAL | 2 refills | Status: DC

## 2019-09-08 MED ORDER — SERTRALINE HCL 100 MG PO TABS: 100.0000 mg | ORAL_TABLET | Freq: Every day | ORAL | 2 refills | Status: AC

## 2019-09-08 NOTE — Assessment & Plan Note (Addendum)
Medications:tramterene-htz 37.5-25. Labetalol 100mg  Pt reports compliance with medication. He does note that he has developed some lightheadedness since being started on the labetalol. Orthostatic vitals in office today are neg. BP noted to be fairly well controlled today in office  Plan: will change labetalol to 50mg  to see if this helps with the lightheadedness. He agrees to let me know if he continues to have sx

## 2019-09-08 NOTE — Assessment & Plan Note (Addendum)
Drew Prince notes occassional sharp chest pain that is exacerbated with deep breaths or certain movements. Notes that this is a chronic issue. Not related to exertion. No associated dizziness or lightheadedness. Denies any recent change with starting labetalol. EKG performed in office today does not indicate an acute event. Regular sinus rhythm.  Plan: encouraged him to seek immediate medical attention should s/s of MI occur--s/s reviewed with him and he implied understanding.

## 2019-09-09 ENCOUNTER — Encounter: Payer: Self-pay | Admitting: Internal Medicine

## 2019-09-09 NOTE — Assessment & Plan Note (Addendum)
Current medications: sertraline 100mg  Acute on chronic depression. Was started on sertraline being of June following an event in which one of his dogs attacked and killed 27 of his cats. Patient notes very little improvement since starting this. He describes exacerbation of symptoms when walking into his kitchen where the incident occurred. Feels like he is reliving the event.  Denies mood swings. Notes that he was previously trialed on duloxetine for chronic pain which made him suicidal. He has tried other SSRIs in the past and did not get relief. Still struggling with staying asleep. Wakes up 2-4 times a night. Has been trying melatonin which has not been helping. He endorses history of suicidal thoughts however he denies any plan at this time. He notes family for support as well as friends who live in [place].   Plan:  reviewed sleep hygiene. Will start trazodone 50mg  for sleep.  Due to failing 2 SSRIs and becoming suicidal with an SNRI, I feel that he may benefit from psychiatry referral. He may benefit from something like Abilify which is not commonly prescribed in our clinic. Counseling offered however he does not wish to attend at this time. Encouraged him to seek immediate medical attention should he develop a suicide plan--he endorsed understanding. Referral placed to psychiatry.  F/u in 3-4w.

## 2019-09-10 NOTE — Progress Notes (Signed)
Internal Medicine Clinic Attending  I saw and evaluated the patient.  I personally confirmed the key portions of the history and exam documented by Dr. Christian   and I reviewed pertinent patient test results.  The assessment, diagnosis, and plan were formulated together and I agree with the documentation in the resident's note.  

## 2019-09-18 ENCOUNTER — Encounter: Payer: Self-pay | Admitting: Internal Medicine

## 2019-09-19 ENCOUNTER — Telehealth: Payer: Self-pay | Admitting: Licensed Clinical Social Worker

## 2019-09-19 NOTE — Telephone Encounter (Signed)
Patient was contacted due to a referral from his doctor. Patient agreed to be referred to [facility]. Referral will be sent.

## 2019-10-12 ENCOUNTER — Encounter: Payer: Self-pay | Admitting: Internal Medicine

## 2019-10-17 DIAGNOSIS — F4322 Adjustment disorder with anxiety: Secondary | ICD-10-CM | POA: Diagnosis not present

## 2019-10-17 DIAGNOSIS — F331 Major depressive disorder, recurrent, moderate: Secondary | ICD-10-CM | POA: Diagnosis not present

## 2019-10-17 DIAGNOSIS — F4311 Post-traumatic stress disorder, acute: Secondary | ICD-10-CM | POA: Diagnosis not present

## 2019-11-01 ENCOUNTER — Encounter: Payer: Self-pay | Admitting: Internal Medicine

## 2019-11-14 DIAGNOSIS — F4311 Post-traumatic stress disorder, acute: Secondary | ICD-10-CM | POA: Diagnosis not present

## 2019-11-14 DIAGNOSIS — F331 Major depressive disorder, recurrent, moderate: Secondary | ICD-10-CM | POA: Diagnosis not present

## 2019-11-14 DIAGNOSIS — F4322 Adjustment disorder with anxiety: Secondary | ICD-10-CM | POA: Diagnosis not present

## 2019-12-12 DIAGNOSIS — F4322 Adjustment disorder with anxiety: Secondary | ICD-10-CM | POA: Diagnosis not present

## 2019-12-12 DIAGNOSIS — F331 Major depressive disorder, recurrent, moderate: Secondary | ICD-10-CM | POA: Diagnosis not present

## 2019-12-12 DIAGNOSIS — F4311 Post-traumatic stress disorder, acute: Secondary | ICD-10-CM | POA: Diagnosis not present

## 2020-01-09 DIAGNOSIS — F4310 Post-traumatic stress disorder, unspecified: Secondary | ICD-10-CM | POA: Diagnosis not present

## 2020-01-17 ENCOUNTER — Other Ambulatory Visit: Payer: Self-pay

## 2020-02-07 DIAGNOSIS — F4311 Post-traumatic stress disorder, acute: Secondary | ICD-10-CM | POA: Diagnosis not present

## 2020-02-07 DIAGNOSIS — F4322 Adjustment disorder with anxiety: Secondary | ICD-10-CM | POA: Diagnosis not present

## 2020-02-07 DIAGNOSIS — F331 Major depressive disorder, recurrent, moderate: Secondary | ICD-10-CM | POA: Diagnosis not present

## 2020-02-23 DIAGNOSIS — F341 Dysthymic disorder: Secondary | ICD-10-CM | POA: Diagnosis not present

## 2020-03-05 ENCOUNTER — Other Ambulatory Visit: Payer: Self-pay | Admitting: *Deleted

## 2020-03-05 DIAGNOSIS — I10 Essential (primary) hypertension: Secondary | ICD-10-CM

## 2020-03-06 MED ORDER — LABETALOL HCL 100 MG PO TABS: 50.0000 mg | ORAL_TABLET | Freq: Two times a day (BID) | ORAL | 2 refills | Status: DC

## 2020-03-06 NOTE — Telephone Encounter (Signed)
Needs f/u appt w/ pcp

## 2020-03-06 NOTE — Telephone Encounter (Signed)
Please have patient return for htn recheck.

## 2020-03-10 NOTE — Progress Notes (Signed)
   CC: chronic depression, chronic hypertension  HPI:  [name] is a 44 y.o. male who presents for follow up on his above stated medical conditions. Please see problem based assessment and plan for additional details.     Past Medical History:  Diagnosis Date  . Acute pancreatitis    1-8-17to 02-03-16 Hospital stay at [facility].  . Bile leak   . Depression   . Diverticulosis   . GERD (gastroesophageal reflux disease)   . H/O seasonal allergies    OTC med used  . Hepatic steatosis   . Hypercholesteremia   . Hypertension    01-05-16 to 02-03-16 Hospitalized for pancreatitis, B/P med discontinued during that time.  . Pancreatic necrosis   . Pancreatic pseudocyst   . PONV (postoperative nausea and vomiting) 03-04-16   ponv after cholecystectomy    Review of Systems:  Review of Systems - General ROS: negative for - chills or fever Psychological ROS: negative for - suicidal ideation Cardiovascular ROS: negative for - chest pain Gastrointestinal ROS: negative for - abdominal pain, constipation or diarrhea   Physical Exam:  Vitals:   03/14/20 1519 03/14/20 1559  BP: (!) 138/96 (!) 139/97  Pulse: 100 90  Temp: 98.8 F (37.1 C)   TempSrc: Oral   SpO2: 100%   Weight: 185 lb 1.6 oz (84 kg)     GENERAL: well appearing, in no apparent distress CARDIAC: heart regular rate and rhythm PULMONARY: lung sounds clear to auscultation PSYCH: denies suicidal ideation Skin: 3 skin lesions #1.located superior to lesions #2 on right cheek. ~0.5cm Macular darkly pigmented lesion with regular boarders. Non-raised. Even color throughout. No surrounding scaling. #2. Located inferior to lesion #1. ~0.5cm Maculopapular lesion. Somewhat irregular boarders. Irregular pigment distribution. Central scaling. #3. Located along left nasolabial fold. ~0.5cm Skin colored papule with central umbilication.         Assessment & Plan:   Chronic essential hypertension Medications: labetalol 50mg ,  triamterene-hctz 37.5-25mg . Last seen on 09/08/2019 at which time labetalol was decreased from 100mg  to 50mg  for reported lightheadedness. Blood pressure mildly elevated in the clinic today however patient notes white coat hypertension. Patient reports resolution of lightheadedness since decreasing labetalol last time. Assessment: BMP looks good. Hesitant to escalate blood pressure regimen today due to reported lightheadedness at previous dosing. Plan: continue current regimen. Encouraged home blood pressure monitoring. Will re-evaluate at next appointment  Polycythemia. Present on last CBC in 2019. Unclear etiology. No history of risk factors. Repeat CBC today shows stable hemoglobin. Will continue to monitor intermittently.  Chronic major depressive disorder Medications: Abilify 5mg , amitriptyline 25mg , zoloft 100mg  daily Pt notes improvement since last office visit when he was referred over to psychiatry. He was started on the abilify and amitriptyline there which has significantly improved his symptoms.  Plan: continue current management  Skin lesions Pt wishing to have 2 skin lesions evaluated today. Left nasolabial fold lesion (lesion #3) has been present for several years. Does not appear overtly concerning. Right facial lesion #2 is slightly more concerning given that it has grown fairly rapidly over the past 39mo and has somewhat irregular boarders and color distribution. Lesion #1 moderately concerning. Plan: will refer to derm for further evaluation  HM -A1C wnl today   Patient is in agreement with the plan and endorses no further questions at this time.  Patient discussed with Dr. , MD Internal Medicine Resident-PGY1 03/18/20

## 2020-03-14 ENCOUNTER — Encounter: Payer: Self-pay | Admitting: Internal Medicine

## 2020-03-14 ENCOUNTER — Ambulatory Visit: Payer: BC Managed Care – PPO | Admitting: Internal Medicine

## 2020-03-14 VITALS — BP 139/97 | HR 90 | Temp 98.8°F | Wt 185.1 lb

## 2020-03-14 DIAGNOSIS — Z131 Encounter for screening for diabetes mellitus: Secondary | ICD-10-CM

## 2020-03-14 DIAGNOSIS — D751 Secondary polycythemia: Secondary | ICD-10-CM | POA: Diagnosis not present

## 2020-03-14 DIAGNOSIS — L819 Disorder of pigmentation, unspecified: Secondary | ICD-10-CM | POA: Diagnosis not present

## 2020-03-14 DIAGNOSIS — I10 Essential (primary) hypertension: Secondary | ICD-10-CM | POA: Diagnosis not present

## 2020-03-14 DIAGNOSIS — F329 Major depressive disorder, single episode, unspecified: Secondary | ICD-10-CM | POA: Diagnosis not present

## 2020-03-14 DIAGNOSIS — Z79899 Other long term (current) drug therapy: Secondary | ICD-10-CM

## 2020-03-14 DIAGNOSIS — L989 Disorder of the skin and subcutaneous tissue, unspecified: Secondary | ICD-10-CM

## 2020-03-14 LAB — POCT GLYCOSYLATED HEMOGLOBIN (HGB A1C): Hemoglobin A1C: 5.5 % (ref 4.0–5.6)

## 2020-03-14 LAB — GLUCOSE, CAPILLARY: Glucose-Capillary: 117 mg/dL — ABNORMAL HIGH (ref 70–99)

## 2020-03-14 NOTE — Patient Instructions (Signed)
It was great seeing you today and I'm so happy that you are feeling better!  For those moles on your face, I would like you to see dermatology to have them further evaluated. I will call you with the results of your lab tests.

## 2020-03-15 LAB — BASIC METABOLIC PANEL
BUN/Creatinine Ratio: 24 — ABNORMAL HIGH (ref 9–20)
BUN: 22 mg/dL (ref 6–24)
CO2: 22 mmol/L (ref 20–29)
Calcium: 9.9 mg/dL (ref 8.7–10.2)
Chloride: 96 mmol/L (ref 96–106)
Creatinine, Ser: 0.9 mg/dL (ref 0.76–1.27)
GFR calc Af Amer: 120 mL/min/{1.73_m2} (ref >59)
GFR calc non Af Amer: 104 mL/min/{1.73_m2} (ref >59)
Glucose: 94 mg/dL (ref 65–99)
Potassium: 4.1 mmol/L (ref 3.5–5.2)
Sodium: 136 mmol/L (ref 134–144)

## 2020-03-15 LAB — CBC
Hematocrit: 52 % — ABNORMAL HIGH (ref 37.5–51.0)
Hemoglobin: 17.3 g/dL (ref 13.0–17.7)
MCH: 25 pg — ABNORMAL LOW (ref 26.6–33.0)
MCHC: 33.3 g/dL (ref 31.5–35.7)
MCV: 75 fL — ABNORMAL LOW (ref 79–97)
Platelets: 284 10*3/uL (ref 150–450)
RBC: 6.93 x10E6/uL — ABNORMAL HIGH (ref 4.14–5.80)
RDW: 16.8 % — ABNORMAL HIGH (ref 11.6–15.4)
WBC: 8.3 10*3/uL (ref 3.4–10.8)

## 2020-03-16 ENCOUNTER — Encounter: Payer: Self-pay | Admitting: Internal Medicine

## 2020-03-19 NOTE — Assessment & Plan Note (Signed)
Chronic essential hypertension Medications: labetalol 50mg , triamterene-hctz 37.5-25mg . Last seen on 09/08/2019 at which time labetalol was decreased from 100mg  to 50mg  for reported lightheadedness. Blood pressure mildly elevated in the clinic today however patient notes white coat hypertension. Patient reports resolution of lightheadedness since decreasing labetalol last time. Assessment: BMP looks good. Hesitant to escalate blood pressure regimen today due to reported lightheadedness at previous dosing. Plan: continue current regimen. Encouraged home blood pressure monitoring. Will re-evaluate at next appointment

## 2020-03-19 NOTE — Assessment & Plan Note (Signed)
Skin lesions Pt wishing to have 2 skin lesions evaluated today. Left nasolabial fold lesion (lesion #3) has been present for several years. Does not appear overtly concerning. Right facial lesion #2 is slightly more concerning given that it has grown fairly rapidly over the past 77mo and has somewhat irregular boarders and color distribution. Lesion #1 moderately concerning. Plan: will refer to derm for further evaluation

## 2020-03-19 NOTE — Assessment & Plan Note (Signed)
Chronic major depressive disorder Medications: Abilify 5mg , amitriptyline 25mg , zoloft 100mg  daily Pt notes improvement since last office visit when he was referred over to psychiatry. He was started on the abilify and amitriptyline there which has significantly improved his symptoms.  Plan: continue current management

## 2020-03-19 NOTE — Progress Notes (Signed)
Internal Medicine Clinic Attending  I saw and evaluated the patient.  I personally confirmed the key portions of the history and exam documented by Dr. Christian and I reviewed pertinent patient test results.  The assessment, diagnosis, and plan were formulated together and I agree with the documentation in the resident's note.  Alexander Raines, M.D., Ph.D.  

## 2020-04-03 ENCOUNTER — Encounter: Payer: Self-pay | Admitting: Internal Medicine

## 2020-04-03 DIAGNOSIS — F4311 Post-traumatic stress disorder, acute: Secondary | ICD-10-CM | POA: Diagnosis not present

## 2020-04-03 DIAGNOSIS — F331 Major depressive disorder, recurrent, moderate: Secondary | ICD-10-CM | POA: Diagnosis not present

## 2020-04-03 DIAGNOSIS — F4322 Adjustment disorder with anxiety: Secondary | ICD-10-CM | POA: Diagnosis not present

## 2020-04-04 ENCOUNTER — Other Ambulatory Visit: Payer: Self-pay | Admitting: Internal Medicine

## 2020-04-04 DIAGNOSIS — R400 Somnolence: Secondary | ICD-10-CM

## 2020-04-04 NOTE — Progress Notes (Signed)
Patient noting continued interrupted sleep at night along with daytime sleepiness. Wakes up frequently at night. Does not wake up feeling well rested. He does endorse a history of snoring. Given his BMI--31.8--along with persistent sleep issues, I think it would be reasonable to obtain a sleep study for further evaluation for OSA.

## 2020-04-08 ENCOUNTER — Encounter: Payer: Self-pay | Admitting: Internal Medicine

## 2020-04-09 NOTE — Telephone Encounter (Signed)
He just let me know he got both his COVID vaccine series. Are you able to see the photo he sent with that.

## 2020-04-24 ENCOUNTER — Telehealth: Payer: Self-pay | Admitting: *Deleted

## 2020-04-24 DIAGNOSIS — R400 Somnolence: Secondary | ICD-10-CM

## 2020-04-24 NOTE — Telephone Encounter (Signed)
Order replaced

## 2020-04-24 NOTE — Addendum Note (Signed)
Addended by: Elige Radon on: 04/24/2020 02:33 PM   Modules accepted: Orders

## 2020-04-24 NOTE — Telephone Encounter (Signed)
Ok. Please relay this to [place] and I will place the order for home sleep study. Thanks

## 2020-04-24 NOTE — Telephone Encounter (Signed)
Please place a new Order for Home Sleep Study which is order [id number] and [facility] will go ahead and contact the patient.

## 2020-04-24 NOTE — Telephone Encounter (Signed)
chilon and Dr. Ephriam Knuckles, rec'd call that pt's insurance refused in lab sleep study, will need at home sleep study

## 2020-07-04 ENCOUNTER — Other Ambulatory Visit: Payer: Self-pay | Admitting: *Deleted

## 2020-07-04 ENCOUNTER — Encounter: Payer: Self-pay | Admitting: Internal Medicine

## 2020-07-04 DIAGNOSIS — K219 Gastro-esophageal reflux disease without esophagitis: Secondary | ICD-10-CM

## 2020-07-04 MED ORDER — PANTOPRAZOLE SODIUM 40 MG PO TBEC: DELAYED_RELEASE_TABLET | ORAL | 1 refills | Status: DC

## 2020-07-11 ENCOUNTER — Encounter (HOSPITAL_BASED_OUTPATIENT_CLINIC_OR_DEPARTMENT_OTHER): Payer: BC Managed Care – PPO | Admitting: Internal Medicine

## 2020-07-22 NOTE — Addendum Note (Signed)
Addended by: Neomia Dear on: 07/22/2020 04:27 PM   Modules accepted: Orders

## 2020-08-07 ENCOUNTER — Other Ambulatory Visit: Payer: Self-pay | Admitting: Internal Medicine

## 2020-08-07 DIAGNOSIS — I10 Essential (primary) hypertension: Secondary | ICD-10-CM

## 2020-08-07 MED ORDER — TRIAMTERENE-HCTZ 37.5-25 MG PO TABS: 1.0000 | ORAL_TABLET | Freq: Every day | ORAL | 2 refills | Status: DC

## 2020-08-29 ENCOUNTER — Ambulatory Visit (INDEPENDENT_AMBULATORY_CARE_PROVIDER_SITE_OTHER): Payer: PRIVATE HEALTH INSURANCE | Admitting: Internal Medicine

## 2020-08-29 ENCOUNTER — Encounter: Payer: Self-pay | Admitting: Internal Medicine

## 2020-08-29 ENCOUNTER — Other Ambulatory Visit: Payer: Self-pay

## 2020-08-29 DIAGNOSIS — K219 Gastro-esophageal reflux disease without esophagitis: Secondary | ICD-10-CM

## 2020-08-29 DIAGNOSIS — I10 Essential (primary) hypertension: Secondary | ICD-10-CM

## 2020-08-29 DIAGNOSIS — F4310 Post-traumatic stress disorder, unspecified: Secondary | ICD-10-CM | POA: Diagnosis not present

## 2020-08-29 DIAGNOSIS — F329 Major depressive disorder, single episode, unspecified: Secondary | ICD-10-CM | POA: Diagnosis not present

## 2020-08-29 MED ORDER — PANTOPRAZOLE SODIUM 40 MG PO TBEC: DELAYED_RELEASE_TABLET | ORAL | 3 refills | Status: DC

## 2020-08-29 MED ORDER — LABETALOL HCL 100 MG PO TABS: 50.0000 mg | ORAL_TABLET | Freq: Two times a day (BID) | ORAL | 2 refills | Status: DC

## 2020-08-29 MED ORDER — TRIAMTERENE-HCTZ 37.5-25 MG PO TABS: 1.0000 | ORAL_TABLET | Freq: Every day | ORAL | 2 refills | Status: DC

## 2020-08-29 NOTE — Patient Instructions (Signed)
It was wonderful seeing you again today! Your blood pressure is running a little high today. I would like you to check your blood pressure twice a day for a week and send me your results. We can make any necessary adjustments based on that.

## 2020-08-31 NOTE — Assessment & Plan Note (Addendum)
Blood pressure is elevated in the office today at 145/95 and 144/105 on recheck. Pt does have white coat hypertension. 10Y ASVCD 4.1% Plan --will have him obtain a home blood pressure cough and monitor BP BID x1w. He will bring in the log sheet to me after completion --if blood pressure does remain elevated on the log sheet, I would consider starting a 3rd agent such as 5mg  amlodipine or 10mg  lisinopril.

## 2020-08-31 NOTE — Assessment & Plan Note (Signed)
MDD and PTSD. Follows at neuropsychiatric center. Denies suicidal thoughts today. He feels symptoms are better controlled Management per Psychiatric provider

## 2020-08-31 NOTE — Progress Notes (Signed)
Office Visit   Patient ID: Drew Prince, male    DOB: 03-02-1976, 44 y.o.   MRN: [id number]  Subjective:  CC: blood pressure recheck  HPI 44 y.o. presents today for reevaluation of hypertension. Currently on triamterene-hctz 37.5-25mg  and labetalol 50mg  bid. Denies any issues with side effects including hypotension.   We also visited about his PTSD and anxiety which are chronic. He has been following with the neuropsychiatric center and notes that he feels his symptoms are improved over the past 25mo. Denies suicdial thoughts.       ACTIVE MEDICATIONS   Current Outpatient Medications on File Prior to Visit  Medication Sig Dispense Refill  . amitriptyline (ELAVIL) 25 MG tablet Take 25 mg by mouth at bedtime.    . ARIPiprazole (ABILIFY) 5 MG tablet Take 5 mg by mouth daily.    . sertraline (ZOLOFT) 100 MG tablet Take 1 tablet (100 mg total) by mouth daily. 30 tablet 2   No current facility-administered medications on file prior to visit.    ROS  Review of Systems  Constitutional: Negative.   Respiratory: Negative for cough and shortness of breath.   Cardiovascular: Negative for chest pain.  Neurological: Negative for syncope, light-headedness and headaches.  Psychiatric/Behavioral: Negative for suicidal ideas.    Objective:   BP (!) 144/105 (BP Location: Left Arm, Cuff Size: Large)   Pulse 76   Temp 98.1 F (36.7 C) (Oral)   Ht 5\' 4"  (1.626 m)   Wt 183 lb 6.4 oz (83.2 kg)   SpO2 100% Comment: room air  BMI 31.48 kg/m  Wt Readings from Last 3 Encounters:  08/29/20 183 lb 6.4 oz (83.2 kg)  03/14/20 185 lb 1.6 oz (84 kg)  09/07/19 179 lb 12.8 oz (81.6 kg)   Physical Exam Constitutional:      Appearance: Normal appearance.  Cardiovascular:     Rate and Rhythm: Normal rate and regular rhythm.  Pulmonary:     Effort: Pulmonary effort is normal.     Breath sounds: Normal breath sounds.  Psychiatric:        Mood and Affect: Mood normal.        Behavior: Behavior  normal.     Comments: Denies suicidal thoughts     Assessment & Plan:   Problem List Items Addressed This Visit      Cardiovascular and Mediastinum   Essential hypertension (Chronic)    Blood pressure is elevated in the office today at 145/95 and 144/105 on recheck. Pt does have white coat hypertension. 10Y ASVCD 4.1% Plan --will have him obtain a home blood pressure cough and monitor BP BID x1w. He will bring in the log sheet to me after completion --if blood pressure does remain elevated on the log sheet, I would consider starting a 3rd agent such as 5mg  amlodipine or 10mg  lisinopril.      Relevant Medications   triamterene-hydrochlorothiazide (MAXZIDE-25) 37.5-25 MG tablet   labetalol (NORMODYNE) 100 MG tablet     Digestive   GERD (gastroesophageal reflux disease)   Relevant Medications   pantoprazole (PROTONIX) 40 MG tablet     Other   Major depressive disorder (Chronic)    MDD and PTSD. Follows at neuropsychiatric center. Denies suicidal thoughts today. He feels symptoms are better controlled Management per Psychiatric provider           Pt discussed with Dr. 11/07/19, MD Internal Medicine Resident PGY-2 Internal Medicine Residency Pager: [phone] 08/31/2020 1:05 AM

## 2020-09-09 NOTE — Addendum Note (Signed)
Addended by: Erlinda Hong T on: 09/09/2020 02:00 PM   Modules accepted: Level of Service

## 2020-09-09 NOTE — Progress Notes (Signed)
Internal Medicine Clinic Attending  Case discussed with Dr. Christian  At the time of the visit.  We reviewed the resident's history and exam and pertinent patient test results.  I agree with the assessment, diagnosis, and plan of care documented in the resident's note.  

## 2021-01-10 ENCOUNTER — Other Ambulatory Visit: Payer: Self-pay | Admitting: Internal Medicine

## 2021-01-10 DIAGNOSIS — K219 Gastro-esophageal reflux disease without esophagitis: Secondary | ICD-10-CM

## 2021-01-17 ENCOUNTER — Other Ambulatory Visit: Payer: Self-pay | Admitting: Internal Medicine

## 2021-01-17 DIAGNOSIS — K219 Gastro-esophageal reflux disease without esophagitis: Secondary | ICD-10-CM

## 2021-01-20 ENCOUNTER — Other Ambulatory Visit: Payer: Self-pay | Admitting: Internal Medicine

## 2021-01-20 DIAGNOSIS — K219 Gastro-esophageal reflux disease without esophagitis: Secondary | ICD-10-CM

## 2021-01-21 ENCOUNTER — Other Ambulatory Visit: Payer: Self-pay | Admitting: Internal Medicine

## 2021-01-21 DIAGNOSIS — K219 Gastro-esophageal reflux disease without esophagitis: Secondary | ICD-10-CM

## 2021-01-21 NOTE — Telephone Encounter (Signed)
Refill Request  pantoprazole (PROTONIX) 40 MG tablet  [facility] - [place], [place] - [address] (Ph: [phone])

## 2021-02-05 ENCOUNTER — Other Ambulatory Visit: Payer: Self-pay

## 2021-02-05 ENCOUNTER — Other Ambulatory Visit: Payer: Self-pay | Admitting: Internal Medicine

## 2021-02-05 DIAGNOSIS — I10 Essential (primary) hypertension: Secondary | ICD-10-CM

## 2021-02-05 DIAGNOSIS — K219 Gastro-esophageal reflux disease without esophagitis: Secondary | ICD-10-CM

## 2021-02-05 MED ORDER — PANTOPRAZOLE SODIUM 40 MG PO TBEC: DELAYED_RELEASE_TABLET | ORAL | 3 refills | Status: DC

## 2021-02-05 NOTE — Telephone Encounter (Signed)
Please make appointment for f/u with PCP in March

## 2021-02-05 NOTE — Telephone Encounter (Signed)
Pls contact pharmacy [phone]

## 2021-02-05 NOTE — Telephone Encounter (Signed)
Return call to [facility] - stated Pantoprazole was discontinued; informed it was refilled 08/29/20 #90 x 3 RF. But she stated they will need a new rx.

## 2021-02-21 ENCOUNTER — Other Ambulatory Visit: Payer: Self-pay | Admitting: Internal Medicine

## 2021-02-21 DIAGNOSIS — I10 Essential (primary) hypertension: Secondary | ICD-10-CM

## 2021-02-21 NOTE — Telephone Encounter (Signed)
Next appt scheduled 03/03/21.

## 2021-03-03 ENCOUNTER — Ambulatory Visit: Payer: PRIVATE HEALTH INSURANCE | Admitting: Internal Medicine

## 2021-03-03 ENCOUNTER — Other Ambulatory Visit: Payer: Self-pay

## 2021-03-03 ENCOUNTER — Encounter: Payer: Self-pay | Admitting: Internal Medicine

## 2021-03-03 VITALS — BP 131/97 | HR 85 | Temp 98.2°F | Ht 64.0 in | Wt 184.5 lb

## 2021-03-03 DIAGNOSIS — E782 Mixed hyperlipidemia: Secondary | ICD-10-CM | POA: Diagnosis not present

## 2021-03-03 DIAGNOSIS — I10 Essential (primary) hypertension: Secondary | ICD-10-CM

## 2021-03-03 DIAGNOSIS — Z1211 Encounter for screening for malignant neoplasm of colon: Secondary | ICD-10-CM

## 2021-03-03 DIAGNOSIS — Z0000 Encounter for general adult medical examination without abnormal findings: Secondary | ICD-10-CM

## 2021-03-03 MED ORDER — TRIAMTERENE-HCTZ 37.5-25 MG PO TABS: 1.0000 | ORAL_TABLET | Freq: Every day | ORAL | 2 refills | Status: DC

## 2021-03-03 MED ORDER — LABETALOL HCL 100 MG PO TABS: 100.0000 mg | ORAL_TABLET | Freq: Two times a day (BID) | ORAL | 2 refills | Status: DC

## 2021-03-03 NOTE — Progress Notes (Signed)
Office Visit   Patient ID: Drew Prince, male    DOB: [birth year], 45 y.o.   MRN: [id number]  Subjective:  CC: chronic hypertension, depression  HPI 45 y.o. presents today for follow up of chronic medical conditions. Please refer to problem based charting for details of assessment and plan.      ACTIVE MEDICATIONS   Outpatient Medications Prior to Visit  Medication Sig Dispense Refill  . mirtazapine (REMERON) 15 MG tablet Take 15-30 mg by mouth at bedtime.    . ARIPiprazole (ABILIFY) 5 MG tablet Take 5 mg by mouth daily.    . pantoprazole (PROTONIX) 40 MG tablet TAKE 1 TABLET(40 MG) BY MOUTH EVERY EVENING 90 tablet 3  . sertraline (ZOLOFT) 100 MG tablet Take 1 tablet (100 mg total) by mouth daily. 30 tablet 2  . amitriptyline (ELAVIL) 25 MG tablet Take 25 mg by mouth at bedtime.    Marland Kitchen labetalol (NORMODYNE) 100 MG tablet Take 0.5 tablets (50 mg total) by mouth 2 (two) times daily. 60 tablet 2  . triamterene-hydrochlorothiazide (MAXZIDE-25) 37.5-25 MG tablet Take 1 tablet by mouth daily. 90 tablet 2   No facility-administered medications prior to visit.     ROS  Review of Systems  Gastrointestinal: Negative for blood in stool.  Neurological: Negative for dizziness, syncope, light-headedness and headaches.  Psychiatric/Behavioral: Negative for self-injury, sleep disturbance and suicidal ideas.    Objective:   BP (!) 131/97 (BP Location: Left Arm, Cuff Size: Normal)   Pulse 85   Temp 98.2 F (36.8 C) (Oral)   Ht 5\' 4"  (1.626 m)   Wt 184 lb 8 oz (83.7 kg)   SpO2 98% Comment: room air  BMI 31.67 kg/m  Wt Readings from Last 3 Encounters:  03/03/21 184 lb 8 oz (83.7 kg)  08/29/20 183 lb 6.4 oz (83.2 kg)  03/14/20 185 lb 1.6 oz (84 kg)   BP Readings from Last 3 Encounters:  03/03/21 (!) 131/97  08/29/20 (!) 144/105  03/14/20 (!) 139/97   Physical Exam General: well appearing Cardiac: RRR, no LE edema Pulm: lungs clear Psych: normal affect  Health Maintenance:    Health Maintenance  Topic Date Due  . TETANUS/TDAP  Never done  . COLONOSCOPY (Pts 45-72yrs Insurance coverage will need to be confirmed)  Never done  . COVID-19 Vaccine (3 - Booster for Moderna series) 10/08/2020  . INFLUENZA VACCINE  03/04/2022 (Originally 07/28/2020)  . Hepatitis C Screening  Completed  . HIV Screening  Completed  . HPV VACCINES  Aged Out     Assessment & Plan:   Problem List Items Addressed This Visit      Cardiovascular and Mediastinum   Essential hypertension (Chronic)    Current medications: labetalol 50mg  BID, triamterene-hctz 37.5-25mg  daily Blood pressure is above goal in the office today.  BP Readings from Last 3 Encounters:  03/03/21 (!) 131/97  08/29/20 (!) 144/105  03/14/20 (!) 139/97   Lipid panel obtained at today's visit 10Y ASCVD Risk 4.6%  Plan -BMP -increase labetalol to 100mg  BID -continue triamterene-hctz at current dose -f/u for a blood pressure recheck in 2-4w. Discussed that if we are unable to get his SBP closer to 120, we may have to consider adding on another agent. Pt is somewhat resistent to this but understands the importance of blood pressure control to reduce his risk for long term cardiovascular events. -depending on what his blood pressure recheck in a couple weeks looks like, will have him follow up in 3-23mo  Addendum:  electrolytes and renal function appear appropriate on today's BMP      Relevant Medications   labetalol (NORMODYNE) 100 MG tablet   triamterene-hydrochlorothiazide (MAXZIDE-25) 37.5-25 MG tablet   Other Relevant Orders   Basic metabolic panel (Completed)   Lipid panel (Completed)   Ambulatory referral to Gastroenterology     Other   Healthcare maintenance (Chronic)    Discussed updated recommendations for colorectal screening starting at 45. Pt agreeable to GI referral for colonoscopy.        Hyperlipidemia    Lipid Panel     Component Value Date/Time   CHOL 207 (H) 03/03/2021 1516   TRIG  336 (H) 03/03/2021 1516   HDL 35 (L) 03/03/2021 1516   CHOLHDL 5.9 (H) 03/03/2021 1516   LDLCALC 114 (H) 03/03/2021 1516   LABVLDL 58 (H) 03/03/2021 1516   10Y ASCVD risk 4.6%.   Plan -statin therapy not indicated at this time -counseled on lifestyle modifications -repeat lipid panel in 1-2Y      Relevant Medications   labetalol (NORMODYNE) 100 MG tablet   triamterene-hydrochlorothiazide (MAXZIDE-25) 37.5-25 MG tablet        Pt discussed with Dr. Rejeana Brock, MD Internal Medicine Resident PGY-2 [facility] Pager: [phone] 03/04/2021 6:22 PM

## 2021-03-04 ENCOUNTER — Encounter: Payer: Self-pay | Admitting: Internal Medicine

## 2021-03-04 LAB — BASIC METABOLIC PANEL
BUN/Creatinine Ratio: 17 (ref 9–20)
BUN: 17 mg/dL (ref 6–24)
CO2: 25 mmol/L (ref 20–29)
Calcium: 9.7 mg/dL (ref 8.7–10.2)
Chloride: 99 mmol/L (ref 96–106)
Creatinine, Ser: 0.98 mg/dL (ref 0.76–1.27)
Glucose: 101 mg/dL — ABNORMAL HIGH (ref 65–99)
Potassium: 3.7 mmol/L (ref 3.5–5.2)
Sodium: 140 mmol/L (ref 134–144)
eGFR: 97 mL/min/{1.73_m2} (ref >59)

## 2021-03-04 LAB — LIPID PANEL
Chol/HDL Ratio: 5.9 ratio — ABNORMAL HIGH (ref 0.0–5.0)
Cholesterol, Total: 207 mg/dL — ABNORMAL HIGH (ref 100–199)
HDL: 35 mg/dL — ABNORMAL LOW (ref >39)
LDL Chol Calc (NIH): 114 mg/dL — ABNORMAL HIGH (ref 0–99)
Triglycerides: 336 mg/dL — ABNORMAL HIGH (ref 0–149)
VLDL Cholesterol Cal: 58 mg/dL — ABNORMAL HIGH (ref 5–40)

## 2021-03-04 NOTE — Assessment & Plan Note (Signed)
Discussed updated recommendations for colorectal screening starting at 45. Pt agreeable to GI referral for colonoscopy.

## 2021-03-04 NOTE — Assessment & Plan Note (Addendum)
Current medications: labetalol 50mg  BID, triamterene-hctz 37.5-25mg  daily Blood pressure is above goal in the office today.  BP Readings from Last 3 Encounters:  03/03/21 (!) 131/97  08/29/20 (!) 144/105  03/14/20 (!) 139/97   Lipid panel obtained at today's visit 10Y ASCVD Risk 4.6%  Plan -BMP -increase labetalol to 100mg  BID -continue triamterene-hctz at current dose -f/u for a blood pressure recheck in 2-4w. Discussed that if we are unable to get his SBP closer to 120, we may have to consider adding on another agent. Pt is somewhat resistent to this but understands the importance of blood pressure control to reduce his risk for long term cardiovascular events. -depending on what his blood pressure recheck in a couple weeks looks like, will have him follow up in 3-16mo  Addendum: electrolytes and renal function appear appropriate on today's BMP

## 2021-03-04 NOTE — Assessment & Plan Note (Signed)
Lipid Panel     Component Value Date/Time   CHOL 207 (H) 03/03/2021 1516   TRIG 336 (H) 03/03/2021 1516   HDL 35 (L) 03/03/2021 1516   CHOLHDL 5.9 (H) 03/03/2021 1516   LDLCALC 114 (H) 03/03/2021 1516   LABVLDL 58 (H) 03/03/2021 1516   10Y ASCVD risk 4.6%.   Plan -statin therapy not indicated at this time -counseled on lifestyle modifications -repeat lipid panel in 1-2Y

## 2021-03-05 MED ORDER — TRIAMTERENE-HCTZ 37.5-25 MG PO TABS: 1.0000 | ORAL_TABLET | Freq: Every day | ORAL | 2 refills | Status: DC

## 2021-03-05 MED ORDER — LABETALOL HCL 100 MG PO TABS: 100.0000 mg | ORAL_TABLET | Freq: Two times a day (BID) | ORAL | 2 refills | Status: DC

## 2021-03-05 NOTE — Progress Notes (Signed)
Internal Medicine Clinic Attending  Case discussed with Dr. Christian  At the time of the visit.  We reviewed the resident's history and exam and pertinent patient test results.  I agree with the assessment, diagnosis, and plan of care documented in the resident's note.  

## 2021-03-05 NOTE — Addendum Note (Signed)
Addended by: Elige Radon on: 03/05/2021 08:12 AM   Modules accepted: Orders

## 2021-04-13 ENCOUNTER — Encounter: Payer: Self-pay | Admitting: Internal Medicine

## 2021-04-24 ENCOUNTER — Encounter: Payer: Self-pay | Admitting: Internal Medicine

## 2021-04-24 ENCOUNTER — Ambulatory Visit: Payer: PRIVATE HEALTH INSURANCE | Admitting: Internal Medicine

## 2021-04-24 VITALS — BP 124/96 | HR 93 | Temp 97.9°F | Ht 64.0 in | Wt 179.2 lb

## 2021-04-24 DIAGNOSIS — I10 Essential (primary) hypertension: Secondary | ICD-10-CM | POA: Diagnosis not present

## 2021-04-24 MED ORDER — CARVEDILOL 12.5 MG PO TABS: 12.5000 mg | ORAL_TABLET | Freq: Two times a day (BID) | ORAL | 11 refills | Status: DC

## 2021-04-24 NOTE — Patient Instructions (Signed)
Thank you for allowing [name] to provide your care today. Today we discussed hypertension.    Today we made changes to your medications.  Please stop taking labetalol 100 mg. Start taking carvedilol 12.5 mg twice day.   Please follow-up in 3 months for blood pressure check. Also, please bring your blood pressure cuff to your next visit.   Should you have any questions or concerns please call the internal medicine clinic at [phone].

## 2021-04-24 NOTE — Progress Notes (Signed)
   CC: HTN  HPI:  Mr.Heywood Ehrmann is a 45 y.o. with a past medical history listed below presenting for follow-up evaluation of his hypertension. For details of today's visit and the status of his chronic medical issues please refer to the assessment and plan.   Past Medical History:  Diagnosis Date  . Acute pancreatitis    1-8-17to 02-03-16 Hospital stay at [facility].  . Bile leak   . Depression   . Diverticulosis   . GERD (gastroesophageal reflux disease)   . H/O seasonal allergies    OTC med used  . Hepatic steatosis   . Hypercholesteremia   . Hypertension    01-05-16 to 02-03-16 Hospitalized for pancreatitis, B/P med discontinued during that time.  . Pancreatic necrosis   . Pancreatic pseudocyst   . PONV (postoperative nausea and vomiting) 03-04-16   ponv after cholecystectomy   Review of Systems:   Review of Systems  Respiratory: Negative for cough and shortness of breath.   Cardiovascular: Negative for chest pain, palpitations and leg swelling.  Gastrointestinal: Negative for abdominal pain, nausea and vomiting.  Neurological: Positive for headaches. Negative for dizziness and weakness.     Physical Exam:  Vitals:   04/24/21 1005  BP: (!) 124/96  Pulse: 93  Temp: 97.9 F (36.6 C)  TempSrc: Oral  Weight: 179 lb 3.2 oz (81.3 kg)  Height: 5\' 4"  (1.626 m)    Physical Exam Vitals reviewed.  Constitutional:      Appearance: Normal appearance.  Cardiovascular:     Rate and Rhythm: Normal rate and regular rhythm.     Pulses: Normal pulses.     Heart sounds: Normal heart sounds.  Pulmonary:     Effort: Pulmonary effort is normal. No respiratory distress.     Breath sounds: Normal breath sounds. No wheezing or rales.  Abdominal:     General: Abdomen is flat. Bowel sounds are normal. There is no distension.     Palpations: Abdomen is soft.     Tenderness: There is no abdominal tenderness. There is no guarding.  Musculoskeletal:        General: No swelling or  tenderness.     Right lower leg: No edema.     Left lower leg: No edema.  Skin:    General: Skin is warm and dry.  Neurological:     Mental Status: He is alert and oriented to person, place, and time.  Psychiatric:        Mood and Affect: Mood normal.        Behavior: Behavior normal.        Thought Content: Thought content normal.        Judgment: Judgment normal.     Assessment & Plan:   See Encounters Tab for problem based charting.  Patient discussed with Dr. 

## 2021-04-25 NOTE — Assessment & Plan Note (Signed)
BP Readings from Last 3 Encounters:  04/24/21 (!) 124/96  03/03/21 (!) 131/97  08/29/20 (!) 144/105   Patient's blood pressure near goal today.  He has been taking triamterene hydrochlorothiazide 37-25 mg daily.  His labetalol was increased from 50 mg to 100 mg twice daily at his last visit, however he states he ran out of this medication and it was not refilled.  He states his pressures are much higher at home typically ranging in the 140s.  Denies any chest pain or shortness of breath.  States that he often has a left-sided headache.  No changes in his vision.  In the past he has not tolerated amlodipine due to cramps and lisinopril due to acute pancreatitis which required hospitalization.  Discussed that the side effects he experienced with amlodipine are not typical of this medication and would recommend possibly retrying this medication again in the future.  Also discussed that he may tolerate ARB therapies.  For now we will switch labetalol to carvedilol for better blood pressure control.    Assessment/plan: -Blood pressure controlled today.  Continue triamterene-hydrochlorothiazide. - Switch labetalol to carvedilol 12.5 mg twice daily -Follow-up in 3 months for blood pressure check, advised him to bring his home cuff at next visit

## 2021-04-25 NOTE — Progress Notes (Signed)
Internal Medicine Clinic Attending ° °Case discussed with Dr. Rehman  At the time of the visit.  We reviewed the resident’s history and exam and pertinent patient test results.  I agree with the assessment, diagnosis, and plan of care documented in the resident’s note.  ° °

## 2021-05-16 ENCOUNTER — Encounter: Payer: Self-pay | Admitting: *Deleted

## 2021-05-16 LAB — HM COLONOSCOPY

## 2021-07-01 ENCOUNTER — Encounter: Payer: Self-pay | Admitting: *Deleted

## 2022-01-12 NOTE — Progress Notes (Deleted)
° °  Office Visit   Patient ID: Drew Prince, male    DOB: [birth year], 46 y.o.   MRN: [id number]   PCP: Elige Radon, MD   Subjective:   Drew Prince is a 46 y.o. year old male with hypertension, hyperlipidemia, and depression who presents for follow up of these chronic medical conditions.   LOV with me was in March 2022. Blood pressure was slightly above goal at that time; labetalol increased.  Interm History 03/2021: [facility] blood pressure recheck visit. Blood pressure near goal. Had ran out of medication and had not increased labetalol dose. Switched from labetalol to coreg. 04/2021: underwent colonoscopy. 1 polyp identified in large intestine. Pathology showed focal hyperplastic change.    ACTIVE MEDICATIONS   Outpatient Medications Prior to Visit  Medication Sig   ARIPiprazole (ABILIFY) 5 MG tablet Take 5 mg by mouth daily.   carvedilol (COREG) 12.5 MG tablet Take 1 tablet (12.5 mg total) by mouth 2 (two) times daily.   mirtazapine (REMERON) 15 MG tablet Take 15-30 mg by mouth at bedtime.   pantoprazole (PROTONIX) 40 MG tablet TAKE 1 TABLET(40 MG) BY MOUTH EVERY EVENING   sertraline (ZOLOFT) 100 MG tablet Take 1 tablet (100 mg total) by mouth daily.   triamterene-hydrochlorothiazide (MAXZIDE-25) 37.5-25 MG tablet Take 1 tablet by mouth daily.   No facility-administered medications prior to visit.     Objective:   There were no vitals taken for this visit. Wt Readings from Last 3 Encounters:  04/24/21 179 lb 3.2 oz (81.3 kg)  03/03/21 184 lb 8 oz (83.7 kg)  08/29/20 183 lb 6.4 oz (83.2 kg)    BP Readings from Last 3 Encounters:  04/24/21 (!) 124/96  03/03/21 (!) 131/97  08/29/20 (!) 144/105     Health Maintenance:   Health Maintenance  Topic Date Due   TETANUS/TDAP  Never done   COVID-19 Vaccine (3 - Booster for Moderna series) 06/03/2020   COLONOSCOPY (Pts 45-76yrs Insurance coverage will need to be confirmed)  05/17/2031   INFLUENZA VACCINE  Completed    Hepatitis C Screening  Completed   HIV Screening  Completed   Pneumococcal Vaccine 65-58 Years old  Aged Out   HPV VACCINES  Aged Out    Assessment & Plan:   Problem List Items Addressed This Visit   None    No follow-ups on file.   Pt discussed with ***  Elige Radon, MD Internal Medicine Resident PGY-3 [facility] 01/12/2022 9:10 AM

## 2022-01-14 ENCOUNTER — Encounter: Payer: PRIVATE HEALTH INSURANCE | Admitting: Internal Medicine

## 2022-01-18 ENCOUNTER — Encounter: Payer: Self-pay | Admitting: Internal Medicine

## 2022-01-26 ENCOUNTER — Ambulatory Visit: Payer: PRIVATE HEALTH INSURANCE | Admitting: Internal Medicine

## 2022-01-26 ENCOUNTER — Other Ambulatory Visit: Payer: Self-pay

## 2022-01-26 ENCOUNTER — Encounter: Payer: Self-pay | Admitting: Internal Medicine

## 2022-01-26 VITALS — BP 122/85 | HR 77 | Temp 98.8°F | Resp 24 | Ht 64.0 in | Wt 172.8 lb

## 2022-01-26 DIAGNOSIS — Z23 Encounter for immunization: Secondary | ICD-10-CM | POA: Diagnosis not present

## 2022-01-26 DIAGNOSIS — F329 Major depressive disorder, single episode, unspecified: Secondary | ICD-10-CM

## 2022-01-26 DIAGNOSIS — I10 Essential (primary) hypertension: Secondary | ICD-10-CM | POA: Diagnosis not present

## 2022-01-26 DIAGNOSIS — K219 Gastro-esophageal reflux disease without esophagitis: Secondary | ICD-10-CM

## 2022-01-26 DIAGNOSIS — Z0000 Encounter for general adult medical examination without abnormal findings: Secondary | ICD-10-CM

## 2022-01-26 MED ORDER — CARVEDILOL 12.5 MG PO TABS: 12.5000 mg | ORAL_TABLET | Freq: Two times a day (BID) | ORAL | 3 refills | Status: AC

## 2022-01-26 MED ORDER — PANTOPRAZOLE SODIUM 40 MG PO TBEC: DELAYED_RELEASE_TABLET | ORAL | 3 refills | Status: AC

## 2022-01-26 MED ORDER — TRIAMTERENE-HCTZ 37.5-25 MG PO TABS: 1.0000 | ORAL_TABLET | Freq: Every day | ORAL | 3 refills | Status: AC

## 2022-01-26 NOTE — Progress Notes (Signed)
° °  Office Visit   Patient ID: Drew Prince, male    DOB: May 17, 1976, 46 y.o.   MRN: [id number]   PCP: Elige Radon, MD   Subjective:   Drew Prince is a 46 y.o. year old male with hypertension and depression who presents for hypertension follow up. Please refer to problem based charting for HPI details, assessment and plan.    ACTIVE MEDICATIONS   Outpatient Medications Prior to Visit  Medication Sig   ARIPiprazole (ABILIFY) 5 MG tablet Take 5 mg by mouth daily.   mirtazapine (REMERON) 15 MG tablet Take 15-30 mg by mouth at bedtime.   sertraline (ZOLOFT) 100 MG tablet Take 1 tablet (100 mg total) by mouth daily.   [DISCONTINUED] carvedilol (COREG) 12.5 MG tablet Take 1 tablet (12.5 mg total) by mouth 2 (two) times daily.   [DISCONTINUED] pantoprazole (PROTONIX) 40 MG tablet TAKE 1 TABLET(40 MG) BY MOUTH EVERY EVENING   [DISCONTINUED] triamterene-hydrochlorothiazide (MAXZIDE-25) 37.5-25 MG tablet Take 1 tablet by mouth daily.   No facility-administered medications prior to visit.    Objective:   BP 122/85 (BP Location: Left Arm, Cuff Size: Normal)    Pulse 77    Temp 98.8 F (37.1 C) (Oral)    Resp (!) 24    Ht 5\' 4"  (1.626 m)    Wt 172 lb 12.8 oz (78.4 kg)    SpO2 100% Comment: room air   BMI 29.66 kg/m  Wt Readings from Last 3 Encounters:  01/26/22 172 lb 12.8 oz (78.4 kg)  04/24/21 179 lb 3.2 oz (81.3 kg)  03/03/21 184 lb 8 oz (83.7 kg)    BP Readings from Last 3 Encounters:  01/26/22 122/85  04/24/21 (!) 124/96  03/03/21 (!) 131/97   General: well appearing, no distress Cardiac: RRR, no LE edema Pulm: lungs clear throughout Psych: normal mood and affect.   Assessment & Plan:   Problem List Items Addressed This Visit     Essential hypertension (Chronic)    Current medications: triamterene-hctz 37.5-25mg  daily, coreg 12.5mg  BID Blood pressure is at goal in the office today--122/85. Denies adverse medications effects. Reports medication adherence, congruent  with dispense report.  Electrolytes/renal function WNL on labs today.  Continue current management Follow up 6 months      Healthcare maintenance (Chronic)    He reports that he recently underwent a colonoscopy which was normal with 10Y follow up recommendation.  TDaP given today.       Major depressive disorder (Chronic)    He reports ongoing [facility] for depression management. PHQ-9 score is 9 today. He is on abilify, remeron, and zoloft. He brought along FMLA paperwork for me to fill out today due to frequent missed days at work which he relates to his depression. He was not able to be more clear about particular causes of missed work. 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 If they decline, I would ask that he have them forward his records to our office for me to review prior to completing the FMLA forms to determine appropriateness.       Return in about 6 months (around 07/26/2022).  Pt discussed with Dr. 07/28/2022, MD Internal Medicine Resident PGY-3 [facility] 01/27/2022 6:34 AM

## 2022-01-27 ENCOUNTER — Encounter: Payer: Self-pay | Admitting: Internal Medicine

## 2022-01-27 LAB — BASIC METABOLIC PANEL
BUN/Creatinine Ratio: 13 (ref 9–20)
BUN: 12 mg/dL (ref 6–24)
CO2: 27 mmol/L (ref 20–29)
Calcium: 10 mg/dL (ref 8.7–10.2)
Chloride: 97 mmol/L (ref 96–106)
Creatinine, Ser: 0.89 mg/dL (ref 0.76–1.27)
Glucose: 76 mg/dL (ref 70–99)
Potassium: 4.3 mmol/L (ref 3.5–5.2)
Sodium: 140 mmol/L (ref 134–144)
eGFR: 107 mL/min/{1.73_m2} (ref >59)

## 2022-01-27 NOTE — Assessment & Plan Note (Signed)
He reports ongoing follow up with the neuropsychiatric center for depression management. PHQ-9 score is 9 today. He is on abilify, remeron, and zoloft. He brought along FMLA paperwork for me to fill out today due to frequent missed days at work which he relates to his depression. He was not able to be more clear about particular causes of missed work. 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 If they decline, I would ask that he have them forward his records to our office for me to review prior to completing the FMLA forms to determine appropriateness.

## 2022-01-27 NOTE — Progress Notes (Signed)
Internal Medicine Clinic Attending  Case discussed with Dr. Christian  At the time of the visit.  We reviewed the resident's history and exam and pertinent patient test results.  I agree with the assessment, diagnosis, and plan of care documented in the resident's note.  

## 2022-01-27 NOTE — Progress Notes (Signed)
Your labs from your visit with me look good. Please let me know if you have any questions or concerns.  Elige Radon, MD

## 2022-01-27 NOTE — Assessment & Plan Note (Signed)
Current medications: triamterene-hctz 37.5-25mg  daily, coreg 12.5mg  BID Blood pressure is at goal in the office today--122/85. Denies adverse medications effects. Reports medication adherence, congruent with dispense report.  Electrolytes/renal function WNL on labs today.   Continue current management  Follow up 6 months

## 2022-01-27 NOTE — Assessment & Plan Note (Addendum)
He reports that he recently underwent a colonoscopy which was normal with 10Y follow up recommendation.  TDaP given today.

## 2022-03-24 ENCOUNTER — Encounter: Payer: Self-pay | Admitting: Internal Medicine

## 2022-04-15 ENCOUNTER — Ambulatory Visit: Payer: PRIVATE HEALTH INSURANCE | Admitting: Internal Medicine

## 2022-04-15 ENCOUNTER — Encounter: Payer: Self-pay | Admitting: Internal Medicine

## 2022-04-15 VITALS — BP 115/76 | HR 78 | Temp 98.3°F | Ht 64.0 in | Wt 176.0 lb

## 2022-04-15 DIAGNOSIS — R29818 Other symptoms and signs involving the nervous system: Secondary | ICD-10-CM | POA: Diagnosis not present

## 2022-04-15 DIAGNOSIS — R5383 Other fatigue: Secondary | ICD-10-CM

## 2022-04-15 NOTE — Progress Notes (Addendum)
? ?  Office Visit ? ? Patient ID: Drew Prince, male    DOB: 1976-01-16, 46 y.o.   MRN: [id number]   PCP: Elige Radon, MD  ? ?Subjective:  ?CC: Fatigue  ? ?Drew Prince is a 46 y.o. year old male who presents for the above medical condition(s). Please refer to problem based charting for assessment and plan. ? ? ? ?Objective:  ? ?BP 115/76 (BP Location: Right Arm, Patient Position: Sitting, Cuff Size: Small)   Pulse 78   Temp 98.3 ?F (36.8 ?C) (Oral)   Ht 5\' 4"  (1.626 m)   Wt 176 lb (79.8 kg)   SpO2 94%   BMI 30.21 kg/m?  ? ?General: well appearing ?HEENT: no overt mass or adenopathy ?Cardiac: RRR, no murmur ?Pulm: lungs clear throughout ?Abdom: soft, non-distended ?Psych: normal mood and affect ?Skin: warm and dry.  ?Assessment & Plan:  ? ?Problem List Items Addressed This Visit   ? ?  ? Other  ? Fatigue - Primary  ?  He presents for initial evaluation of fatigue, at the request of his psychiatry team, prior to completing FMLA paperwork.  ?Onset: 56mo ?Timing: tired upon awakening and throughout the day ?Sleep schedule: Typically sleeps from 9PM-5AM. Wakes up typically once during the night to urinate, may wake up randomly a second time. Denies issues with falling asleep--typically asleep within 30 minutes.  ?Additional factors:  ?-does not drink coffee. Drinks last caffeined soda at supper. Does not drink alcohol.  ?-wife reports that he snores and occasionally gasps for air at night.  ? ?Assessment ?Suspect sleep apnea--STOP-BANG 5 indicating high risk for mod-severe OSA ?Will obtain general labs to rule out alternative etiologies. His psychiatrist additionally requested that B12 and Vitamin D be ordered. I explained to him that these are not generally part of an initial fatigue workup and may not be covered by his insurance. He wishes to proceed anyway. ? ?Plan ?Check CBC, CMP, TSH, B12, and Vitamin D ?Referred for sleep study ?  ?  ? ?Return in about 6 months (around 10/15/2022). ? ? ?Pt discussed  with Dr. 10/17/2022 ? ?Mikey Bussing, MD ?Internal Medicine Resident PGY-3 ?[facility] ?04/15/2022 4:03 PM  ?  ?

## 2022-04-15 NOTE — Patient Instructions (Signed)
[name], it was a pleasure seeing you today! ? ?Today we discussed your fatigue. I suspect you have something called sleep apnea based on our discussion today. I have placed a referral for a sleep study which is basically where they monitor how many times you stop breathing at night. I will send you a message on my chart when the results of your blood tests return.  ? ? ?Please make sure to arrive 15 minutes prior to your next appointment. If you arrive late, you may be asked to reschedule.  ? ?We look forward to seeing you next time. Please call our clinic at [phone] if you have any questions or concerns. The best time to call is Monday-Friday from 9am-4pm, but there is someone available 24/7. If after hours or the weekend, call the main hospital number and ask for the Internal Medicine Resident On-Call. If you need medication refills, please notify your pharmacy one week in advance and they will send [name] a request. ? ?Thank you for letting us take part in your care. Wishing you the best! ? ? ?

## 2022-04-15 NOTE — Assessment & Plan Note (Addendum)
He presents for initial evaluation of fatigue, at the request of his psychiatry team, prior to completing FMLA paperwork.  ?Onset: 84mo ?Timing: tired upon awakening and throughout the day ?Sleep schedule: Typically sleeps from 9PM-5AM. Wakes up typically once during the night to urinate, may wake up randomly a second time. Denies issues with falling asleep--typically asleep within 30 minutes.  ?Additional factors:  ?-does not drink coffee. Drinks last caffeined soda at supper. Does not drink alcohol.  ?-wife reports that he snores and occasionally gasps for air at night.  ? ?Assessment ?Suspect sleep apnea--STOP-BANG 5 indicating high risk for mod-severe OSA ?Will obtain general labs to rule out alternative etiologies. His psychiatrist additionally requested that B12 and Vitamin D be ordered. I explained to him that these are not generally part of an initial fatigue workup and may not be covered by his insurance. He wishes to proceed anyway. ? ?Plan ?Check CBC, CMP, TSH, B12, and Vitamin D ?Referred for sleep study ? ?

## 2022-04-17 NOTE — Progress Notes (Signed)
Internal Medicine Clinic Attending  Case discussed with Dr. Christian  At the time of the visit.  We reviewed the resident's history and exam and pertinent patient test results.  I agree with the assessment, diagnosis, and plan of care documented in the resident's note.  

## 2022-04-20 LAB — CBC WITH DIFFERENTIAL/PLATELET
Basophils Absolute: 0.1 10*3/uL (ref 0.0–0.2)
Basos: 1 %
EOS (ABSOLUTE): 0.2 10*3/uL (ref 0.0–0.4)
Eos: 3 %
Hematocrit: 49.9 % (ref 37.5–51.0)
Hemoglobin: 17.1 g/dL (ref 13.0–17.7)
Immature Grans (Abs): 0 10*3/uL (ref 0.0–0.1)
Immature Granulocytes: 0 %
Lymphocytes Absolute: 1.7 10*3/uL (ref 0.7–3.1)
Lymphs: 29 %
MCH: 26.3 pg — ABNORMAL LOW (ref 26.6–33.0)
MCHC: 34.3 g/dL (ref 31.5–35.7)
MCV: 77 fL — ABNORMAL LOW (ref 79–97)
Monocytes Absolute: 0.5 10*3/uL (ref 0.1–0.9)
Monocytes: 7 %
Neutrophils Absolute: 3.7 10*3/uL (ref 1.4–7.0)
Neutrophils: 60 %
Platelets: 228 10*3/uL (ref 150–450)
RBC: 6.51 x10E6/uL — ABNORMAL HIGH (ref 4.14–5.80)
RDW: 15.1 % (ref 11.6–15.4)
WBC: 6.1 10*3/uL (ref 3.4–10.8)

## 2022-04-20 LAB — CMP14 + ANION GAP
ALT: 43 IU/L (ref 0–44)
AST: 30 IU/L (ref 0–40)
Albumin/Globulin Ratio: 2 (ref 1.2–2.2)
Albumin: 4.9 g/dL (ref 4.0–5.0)
Alkaline Phosphatase: 74 IU/L (ref 44–121)
Anion Gap: 18 mmol/L (ref 10.0–18.0)
BUN/Creatinine Ratio: 14 (ref 9–20)
BUN: 13 mg/dL (ref 6–24)
Bilirubin Total: 1.5 mg/dL — ABNORMAL HIGH (ref 0.0–1.2)
CO2: 25 mmol/L (ref 20–29)
Calcium: 9.8 mg/dL (ref 8.7–10.2)
Chloride: 97 mmol/L (ref 96–106)
Creatinine, Ser: 0.94 mg/dL (ref 0.76–1.27)
Globulin, Total: 2.4 g/dL (ref 1.5–4.5)
Glucose: 81 mg/dL (ref 70–99)
Potassium: 4.3 mmol/L (ref 3.5–5.2)
Sodium: 140 mmol/L (ref 134–144)
Total Protein: 7.3 g/dL (ref 6.0–8.5)
eGFR: 101 mL/min/{1.73_m2} (ref >59)

## 2022-04-20 LAB — TSH: TSH: 1.61 u[IU]/mL (ref 0.450–4.500)

## 2022-04-20 LAB — VITAMIN B12: Vitamin B-12: 635 pg/mL (ref 232–1245)

## 2022-04-20 LAB — VITAMIN D 1,25 DIHYDROXY
Vitamin D 1, 25 (OH)2 Total: 51 pg/mL
Vitamin D2 1, 25 (OH)2: <10 pg/mL
Vitamin D3 1, 25 (OH)2: 49 pg/mL

## 2022-04-22 NOTE — Progress Notes (Signed)
Good morning! I don't see anything on your labs that would explain your fatigue. Let's plan to move forward with the sleep study because I am suspecting that it will show sleep apnea. If you do not hear from anyone in regards to arranging it, please let our office know so we can follow up. ?Let me know if you have any additional questions or concerns! ?[employer]

## 2022-05-06 ENCOUNTER — Encounter: Payer: Self-pay | Admitting: Internal Medicine
